# Patient Record
Sex: Female | Born: 1935
Health system: Southern US, Community
[De-identification: ages and names within clinical notes are randomized; demographics above are authoritative.]

## PROBLEM LIST (undated history)

## (undated) DIAGNOSIS — I739 Peripheral vascular disease, unspecified: Secondary | ICD-10-CM

## (undated) DIAGNOSIS — G43909 Migraine, unspecified, not intractable, without status migrainosus: Secondary | ICD-10-CM

## (undated) DIAGNOSIS — M810 Age-related osteoporosis without current pathological fracture: Secondary | ICD-10-CM

## (undated) DIAGNOSIS — G459 Transient cerebral ischemic attack, unspecified: Secondary | ICD-10-CM

## (undated) DIAGNOSIS — K219 Gastro-esophageal reflux disease without esophagitis: Secondary | ICD-10-CM

## (undated) DIAGNOSIS — J189 Pneumonia, unspecified organism: Secondary | ICD-10-CM

## (undated) DIAGNOSIS — D649 Anemia, unspecified: Secondary | ICD-10-CM

## (undated) DIAGNOSIS — E785 Hyperlipidemia, unspecified: Secondary | ICD-10-CM

## (undated) DIAGNOSIS — I10 Essential (primary) hypertension: Secondary | ICD-10-CM

## (undated) DIAGNOSIS — I639 Cerebral infarction, unspecified: Secondary | ICD-10-CM

## (undated) DIAGNOSIS — I8390 Asymptomatic varicose veins of unspecified lower extremity: Secondary | ICD-10-CM

## (undated) DIAGNOSIS — G9050 Complex regional pain syndrome I, unspecified: Secondary | ICD-10-CM

## (undated) DIAGNOSIS — E119 Type 2 diabetes mellitus without complications: Secondary | ICD-10-CM

## (undated) DIAGNOSIS — E11319 Type 2 diabetes mellitus with unspecified diabetic retinopathy without macular edema: Secondary | ICD-10-CM

## (undated) DIAGNOSIS — H35039 Hypertensive retinopathy, unspecified eye: Secondary | ICD-10-CM

## (undated) DIAGNOSIS — Z794 Long term (current) use of insulin: Secondary | ICD-10-CM

## (undated) HISTORY — PX: BREAST CYST EXCISION: SHX579

## (undated) HISTORY — DX: Hypertensive retinopathy, unspecified eye: H35.039

## (undated) HISTORY — DX: Complex regional pain syndrome I, unspecified: G90.50

## (undated) HISTORY — DX: Anemia, unspecified: D64.9

## (undated) HISTORY — DX: Hyperlipidemia, unspecified: E78.5

## (undated) HISTORY — DX: Essential (primary) hypertension: I10

## (undated) HISTORY — DX: Type 2 diabetes mellitus with unspecified diabetic retinopathy without macular edema: E11.319

## (undated) HISTORY — DX: Cerebral infarction, unspecified: I63.9

## (undated) HISTORY — PX: TUBAL LIGATION: SHX77

## (undated) HISTORY — DX: Peripheral vascular disease, unspecified: I73.9

## (undated) HISTORY — PX: EYE SURGERY: SHX253

## (undated) HISTORY — DX: Age-related osteoporosis without current pathological fracture: M81.0

## (undated) HISTORY — DX: Migraine, unspecified, not intractable, without status migrainosus: G43.909

## (undated) HISTORY — DX: Asymptomatic varicose veins of unspecified lower extremity: I83.90

## (undated) HISTORY — PX: UMBILICAL HERNIA REPAIR: SHX196

## (undated) HISTORY — DX: Gastro-esophageal reflux disease without esophagitis: K21.9

## (undated) HISTORY — PX: OTHER SURGICAL HISTORY: SHX169

## (undated) HISTORY — DX: Pneumonia, unspecified organism: J18.9

---

## 1964-08-02 HISTORY — PX: APPENDECTOMY: SHX54

## 1986-08-02 HISTORY — PX: ABDOMINAL HYSTERECTOMY: SHX81

## 1987-08-03 HISTORY — PX: CHOLECYSTECTOMY: SHX55

## 1998-03-13 ENCOUNTER — Ambulatory Visit (HOSPITAL_COMMUNITY): Admission: RE | Admit: 1998-03-13 | Discharge: 1998-03-13 | Payer: Self-pay | Admitting: Cardiology

## 1999-03-18 ENCOUNTER — Ambulatory Visit (HOSPITAL_COMMUNITY): Admission: RE | Admit: 1999-03-18 | Discharge: 1999-03-18 | Payer: Self-pay | Admitting: Obstetrics and Gynecology

## 1999-03-18 ENCOUNTER — Encounter: Payer: Self-pay | Admitting: Obstetrics and Gynecology

## 2000-03-18 ENCOUNTER — Encounter: Payer: Self-pay | Admitting: Cardiology

## 2000-03-18 ENCOUNTER — Ambulatory Visit (HOSPITAL_COMMUNITY): Admission: RE | Admit: 2000-03-18 | Discharge: 2000-03-18 | Payer: Self-pay | Admitting: Cardiology

## 2000-04-19 ENCOUNTER — Other Ambulatory Visit: Admission: RE | Admit: 2000-04-19 | Discharge: 2000-04-19 | Payer: Self-pay | Admitting: Obstetrics and Gynecology

## 2002-01-19 ENCOUNTER — Ambulatory Visit (HOSPITAL_COMMUNITY): Admission: RE | Admit: 2002-01-19 | Discharge: 2002-01-19 | Payer: Self-pay | Admitting: Obstetrics and Gynecology

## 2002-01-19 ENCOUNTER — Encounter: Payer: Self-pay | Admitting: Obstetrics and Gynecology

## 2003-01-23 ENCOUNTER — Encounter: Payer: Self-pay | Admitting: Obstetrics and Gynecology

## 2003-01-23 ENCOUNTER — Ambulatory Visit (HOSPITAL_COMMUNITY): Admission: RE | Admit: 2003-01-23 | Discharge: 2003-01-23 | Payer: Self-pay | Admitting: Obstetrics and Gynecology

## 2004-05-25 ENCOUNTER — Ambulatory Visit (HOSPITAL_COMMUNITY): Admission: RE | Admit: 2004-05-25 | Discharge: 2004-05-25 | Payer: Self-pay | Admitting: Obstetrics and Gynecology

## 2004-06-29 ENCOUNTER — Ambulatory Visit (HOSPITAL_COMMUNITY): Admission: RE | Admit: 2004-06-29 | Discharge: 2004-06-29 | Payer: Self-pay | Admitting: Obstetrics and Gynecology

## 2005-05-26 ENCOUNTER — Encounter: Admission: RE | Admit: 2005-05-26 | Discharge: 2005-05-26 | Payer: Self-pay | Admitting: Obstetrics and Gynecology

## 2005-08-02 DIAGNOSIS — G9050 Complex regional pain syndrome I, unspecified: Secondary | ICD-10-CM

## 2005-08-02 HISTORY — DX: Complex regional pain syndrome I, unspecified: G90.50

## 2006-11-04 ENCOUNTER — Encounter: Admission: RE | Admit: 2006-11-04 | Discharge: 2006-11-04 | Payer: Self-pay | Admitting: Obstetrics and Gynecology

## 2006-11-18 ENCOUNTER — Ambulatory Visit (HOSPITAL_COMMUNITY): Admission: RE | Admit: 2006-11-18 | Discharge: 2006-11-18 | Payer: Self-pay | Admitting: Obstetrics and Gynecology

## 2007-11-10 ENCOUNTER — Encounter: Admission: RE | Admit: 2007-11-10 | Discharge: 2007-11-10 | Payer: Self-pay | Admitting: Obstetrics and Gynecology

## 2009-10-31 HISTORY — PX: CATARACT EXTRACTION: SUR2

## 2010-03-19 ENCOUNTER — Ambulatory Visit: Payer: Self-pay | Admitting: Cardiology

## 2010-04-30 ENCOUNTER — Ambulatory Visit: Payer: Self-pay | Admitting: Cardiology

## 2010-04-30 LAB — LIPID PANEL
Cholesterol: 211 mg/dL — AB (ref 0–200)
HDL: 27 mg/dL — AB (ref 35–70)
Triglycerides: 954 mg/dL — AB (ref 40–160)

## 2010-04-30 LAB — BASIC METABOLIC PANEL: Glucose: 153 mg/dL

## 2010-04-30 LAB — HEMOGLOBIN A1C: Hgb A1c MFr Bld: 9.2 % — AB (ref 4.0–6.0)

## 2010-04-30 LAB — HEPATIC FUNCTION PANEL: Bilirubin, Total: 154 mg/dL

## 2010-08-10 ENCOUNTER — Encounter: Payer: Self-pay | Admitting: Cardiology

## 2010-08-10 DIAGNOSIS — I10 Essential (primary) hypertension: Secondary | ICD-10-CM | POA: Insufficient documentation

## 2010-08-10 DIAGNOSIS — E119 Type 2 diabetes mellitus without complications: Secondary | ICD-10-CM | POA: Insufficient documentation

## 2010-08-10 DIAGNOSIS — E785 Hyperlipidemia, unspecified: Secondary | ICD-10-CM | POA: Insufficient documentation

## 2010-08-10 DIAGNOSIS — E781 Pure hyperglyceridemia: Secondary | ICD-10-CM | POA: Insufficient documentation

## 2010-08-22 ENCOUNTER — Encounter: Payer: Self-pay | Admitting: Obstetrics and Gynecology

## 2010-08-24 ENCOUNTER — Encounter: Payer: Self-pay | Admitting: Obstetrics and Gynecology

## 2010-09-02 ENCOUNTER — Ambulatory Visit (INDEPENDENT_AMBULATORY_CARE_PROVIDER_SITE_OTHER): Payer: Medicare Other | Admitting: Cardiology

## 2010-09-02 ENCOUNTER — Other Ambulatory Visit: Payer: Self-pay

## 2010-09-02 ENCOUNTER — Ambulatory Visit: Payer: Self-pay | Admitting: Cardiology

## 2010-09-02 DIAGNOSIS — I119 Hypertensive heart disease without heart failure: Secondary | ICD-10-CM

## 2010-09-02 DIAGNOSIS — E781 Pure hyperglyceridemia: Secondary | ICD-10-CM

## 2010-09-02 DIAGNOSIS — E119 Type 2 diabetes mellitus without complications: Secondary | ICD-10-CM

## 2010-09-02 DIAGNOSIS — Z79899 Other long term (current) drug therapy: Secondary | ICD-10-CM

## 2010-11-04 ENCOUNTER — Other Ambulatory Visit: Payer: Self-pay | Admitting: Cardiology

## 2010-11-04 NOTE — Telephone Encounter (Signed)
Refill meds

## 2010-11-19 ENCOUNTER — Other Ambulatory Visit: Payer: Self-pay | Admitting: Cardiology

## 2010-11-19 DIAGNOSIS — E781 Pure hyperglyceridemia: Secondary | ICD-10-CM

## 2010-11-19 NOTE — Telephone Encounter (Signed)
Refill per escribe request.  

## 2010-12-01 ENCOUNTER — Other Ambulatory Visit: Payer: Self-pay | Admitting: *Deleted

## 2010-12-01 DIAGNOSIS — E7800 Pure hypercholesterolemia, unspecified: Secondary | ICD-10-CM

## 2010-12-09 ENCOUNTER — Encounter: Payer: Self-pay | Admitting: Cardiology

## 2011-01-01 ENCOUNTER — Other Ambulatory Visit: Payer: Self-pay | Admitting: Cardiology

## 2011-01-01 ENCOUNTER — Encounter: Payer: Self-pay | Admitting: Cardiology

## 2011-01-01 ENCOUNTER — Ambulatory Visit (INDEPENDENT_AMBULATORY_CARE_PROVIDER_SITE_OTHER): Payer: Medicare Other | Admitting: Cardiology

## 2011-01-01 ENCOUNTER — Other Ambulatory Visit (INDEPENDENT_AMBULATORY_CARE_PROVIDER_SITE_OTHER): Payer: Medicare Other | Admitting: *Deleted

## 2011-01-01 VITALS — BP 130/70 | HR 70 | Ht 67.0 in | Wt 162.6 lb

## 2011-01-01 DIAGNOSIS — E119 Type 2 diabetes mellitus without complications: Secondary | ICD-10-CM

## 2011-01-01 DIAGNOSIS — E785 Hyperlipidemia, unspecified: Secondary | ICD-10-CM

## 2011-01-01 DIAGNOSIS — E7800 Pure hypercholesterolemia, unspecified: Secondary | ICD-10-CM

## 2011-01-01 DIAGNOSIS — R1013 Epigastric pain: Secondary | ICD-10-CM

## 2011-01-01 DIAGNOSIS — I10 Essential (primary) hypertension: Secondary | ICD-10-CM

## 2011-01-01 DIAGNOSIS — K3189 Other diseases of stomach and duodenum: Secondary | ICD-10-CM

## 2011-01-01 LAB — LIPID PANEL
Cholesterol: 183 mg/dL (ref 0–200)
HDL: 39.3 mg/dL (ref >39.00)
Total CHOL/HDL Ratio: 5
Triglycerides: 833 mg/dL — ABNORMAL HIGH (ref 0.0–149.0)
VLDL: 166.6 mg/dL — ABNORMAL HIGH (ref 0.0–40.0)

## 2011-01-01 LAB — BASIC METABOLIC PANEL
BUN: 19 mg/dL (ref 6–23)
CO2: 29 mEq/L (ref 19–32)
Calcium: 9.4 mg/dL (ref 8.4–10.5)
Chloride: 106 mEq/L (ref 96–112)
Creatinine, Ser: 0.7 mg/dL (ref 0.4–1.2)
GFR: 82.92 mL/min (ref >60.00)
Glucose, Bld: 182 mg/dL — ABNORMAL HIGH (ref 70–99)
Potassium: 4.3 mEq/L (ref 3.5–5.1)
Sodium: 140 mEq/L (ref 135–145)

## 2011-01-01 LAB — HEPATIC FUNCTION PANEL
ALT: 20 U/L (ref 0–35)
AST: 18 U/L (ref 0–37)
Albumin: 3.5 g/dL (ref 3.5–5.2)
Alkaline Phosphatase: 93 U/L (ref 39–117)
Bilirubin, Direct: 0.1 mg/dL (ref 0.0–0.3)
Total Bilirubin: 0.8 mg/dL (ref 0.3–1.2)
Total Protein: 6.9 g/dL (ref 6.0–8.3)

## 2011-01-01 LAB — LDL CHOLESTEROL, DIRECT: Direct LDL: 41.3 mg/dL

## 2011-01-01 MED ORDER — OMEPRAZOLE MAGNESIUM 20 MG PO TBEC: 20.0000 mg | DELAYED_RELEASE_TABLET | Freq: Every day | ORAL | Status: DC

## 2011-01-01 NOTE — Assessment & Plan Note (Signed)
The patient has a past history of hypertension.  She has not been expressing any headaches or chest pain.  She's not having any shortness of breath.  She denies any dizziness or palpitations.  Recently she had a GI virus with nausea and vomiting for one day and with diarrhea for 5 days.  She lost about 5 pounds during that illness.

## 2011-01-01 NOTE — Assessment & Plan Note (Signed)
The patient has a history of insulin-dependent diabetes.  We had tried going up on her Lantus from 66 units to 68 units but it caused her to be hypoglycemic in the early mornings and so she is back on the previous dose.  If she fails to take a bedtime snack she will be hypoglycemic in the mornings.

## 2011-01-01 NOTE — Progress Notes (Signed)
Ivar Bury Date of Birth:  Apr 05, 1929 [facility] / [facility] [address]   [address] [place], [place]  [address] [phone]           Fax   [phone]  History of Present Illness: This pleasant 77 year old woman is seen for a scheduled followup office visit.  She has a history of essential hypertension, insulin-dependent diabetes mellitus, and hypertriglyceridemia.  She does not have any history of coronary disease.  She had a normal Cardiolite stress test 12/11/07.  She has not had a history of congestive heart failure.  She does not have any history of arrhythmia.  Since last visit she's had no cardiac symptoms.  She continues to have fluctuating blood sugars and often will be hypoglycemic in the morning if she fails to have a bedtime snack.  She has also been experiencing frequent dyspepsia for which she takes over-the-counter antacids  Current Outpatient Prescriptions  Medication Sig Dispense Refill  . calcium carbonate (OS-CAL) 600 MG TABS Take 600 mg by mouth 2 (two) times daily with meals.        . fish oil-omega-3 fatty acids 1000 MG capsule Take 2 g by mouth 2 (two) times daily.        Marland Kitchen gemfibrozil (LOPID) 600 MG tablet TAKE 1 TABLET BY MOUTH TWICE DAILY  60 tablet  11  . glimepiride (AMARYL) 4 MG tablet Take 4 mg by mouth 2 (two) times daily.        . hydrochlorothiazide 25 MG tablet Take 12.5 mg by mouth daily.        . insulin aspart (NOVOLOG) 100 UNIT/ML injection Inject into the skin as needed.        . insulin glargine (LANTUS) 100 UNIT/ML injection Inject 64 Units into the skin daily.       . INSULIN SYRINGE .5CC/29G 29G X 1/2" 0.5 ML MISC USE AS DIRECTED  100 each  PRN  . metoprolol (LOPRESSOR) 50 MG tablet Take 12.5 mg by mouth 2 (two) times daily.        . Multiple Vitamin (MULTIVITAMIN) tablet Take 1 tablet by mouth daily.        . potassium chloride SA (K-DUR,KLOR-CON) 20 MEQ tablet Take 20 mEq by mouth 2 (two) times daily.        . Pyridoxine  HCl (VITAMIN B-6) 100 MG tablet Take 100 mg by mouth daily.        Marland Kitchen omeprazole (PRILOSEC OTC) 20 MG tablet Take 1 tablet (20 mg total) by mouth daily.  30 tablet  11    Allergies  Allergen Reactions  . Actos (Pioglitazone Hydrochloride)   . Glucophage (Metformin Hydrochloride)   . Lipitor (Atorvastatin Calcium) Diarrhea  . Lisinopril   . Niacin And Related   . Pravachol   . Tricor     Patient Active Problem List  Diagnoses  . Diabetes mellitus  . Hypertension  . Hypertriglyceridemia  . Dyslipidemia    History  Smoking status  . Never Smoker   Smokeless tobacco  . Not on file    History  Alcohol Use     Family History  Problem Relation Age of Onset  . Stroke Mother   . Hypertension Mother   . Clotting disorder Father   . Heart attack Father   . Arrhythmia Sister   . Stroke Brother     Review of Systems: Constitutional: no fever chills diaphoresis or fatigue or change in weight.  Head and neck: no hearing loss,  no epistaxis, no photophobia or visual disturbance. Respiratory: No cough, shortness of breath or wheezing. Cardiovascular: No chest pain peripheral edema, palpitations. Gastrointestinal: No abdominal distention, no abdominal pain, no change in bowel habits hematochezia or melena. Genitourinary: No dysuria, no frequency, no urgency, no nocturia. Musculoskeletal:No arthralgias, no back pain, no gait disturbance or myalgias. Neurological: No dizziness, no headaches, no numbness, no seizures, no syncope, no weakness, no tremors. Hematologic: No lymphadenopathy, no easy bruising. Psychiatric: No confusion, no hallucinations, no sleep disturbance.    Physical Exam: Filed Vitals:   01/01/11 0909  BP: 130/70  Pulse: 70  The general appearance reveals a well-developed well-nourished woman in no distress.Pupils equal and reactive.   Extraocular Movements are full.  There is no scleral icterus.  The mouth and pharynx are normal.  The neck is supple.  The  carotids reveal no bruits.  The jugular venous pressure is normal.  The thyroid is not enlarged.  There is no lymphadenopathy.The chest is clear to percussion and auscultation. There are no rales or rhonchi. Expansion of the chest is symmetrical.The precordium is quiet.  The first heart sound is normal.  The second heart sound is physiologically split.  There is no murmur gallop rub or click.  There is no abnormal lift or heave.The abdomen is soft and nontender. Bowel sounds are normal. The liver and spleen are not enlarged. There Are no abdominal masses. There are no bruits.The pedal pulses are good.  There is no phlebitis or edema.  There is no cyanosis or clubbing.Strength is normal and symmetrical in all extremities.  There is no lateralizing weakness.  There are no sensory deficits.The skin is warm and dry.  There is no rash.   Assessment / Plan:  We are checking blood work today.  We will add omeprazole 20 mg daily p.r.n. For dyspepsia.  Recheck in 4 months for followup office visit and lab work including glycohemoglobin

## 2011-01-01 NOTE — Assessment & Plan Note (Signed)
The patient has a history of dyslipidemia with elevated triglycerides.  She is trying hard to watch her diet carefully.  She is a 5 pound weight loss Since last visit

## 2011-01-06 ENCOUNTER — Telehealth: Payer: Self-pay | Admitting: *Deleted

## 2011-01-06 NOTE — Telephone Encounter (Signed)
Message copied by Lorayne Bender on Wed Jan 06, 2011  4:21 PM ------      Message from: Cassell Clement      Created: Sun Jan 03, 2011  9:52 PM       BS 182 higher.TG 833 still very high.LFTs nl.Work harder on diabetic control to decrease TG.  Continue gemfibrisol.

## 2011-01-06 NOTE — Telephone Encounter (Signed)
Notified of lab results. 

## 2011-03-08 ENCOUNTER — Other Ambulatory Visit: Payer: Self-pay | Admitting: Cardiology

## 2011-03-08 DIAGNOSIS — E119 Type 2 diabetes mellitus without complications: Secondary | ICD-10-CM

## 2011-03-09 NOTE — Telephone Encounter (Signed)
escribe request  

## 2011-03-24 ENCOUNTER — Other Ambulatory Visit: Payer: Self-pay | Admitting: *Deleted

## 2011-03-24 DIAGNOSIS — E876 Hypokalemia: Secondary | ICD-10-CM

## 2011-03-24 MED ORDER — POTASSIUM CHLORIDE CRYS ER 20 MEQ PO TBCR: 20.0000 meq | EXTENDED_RELEASE_TABLET | Freq: Two times a day (BID) | ORAL | Status: DC

## 2011-03-24 NOTE — Telephone Encounter (Signed)
Refilled meds per fax request.  

## 2011-03-30 ENCOUNTER — Telehealth: Payer: Self-pay | Admitting: Cardiology

## 2011-03-30 NOTE — Telephone Encounter (Signed)
Dr Caryl Never and that practice not taking new patient who do your recommend?

## 2011-03-30 NOTE — Telephone Encounter (Signed)
Called because she inquired about a consult visit with the PCP that Dr. B referred her too and they told her that they were no longer taking any new patients at this time. Please call back.

## 2011-03-31 NOTE — Telephone Encounter (Signed)
The patient could try Dr. Rene Paci or one of her associates

## 2011-03-31 NOTE — Telephone Encounter (Signed)
Advised patinet

## 2011-05-05 ENCOUNTER — Encounter: Payer: Self-pay | Admitting: Internal Medicine

## 2011-05-05 ENCOUNTER — Other Ambulatory Visit: Payer: Self-pay | Admitting: Cardiology

## 2011-05-05 ENCOUNTER — Ambulatory Visit (INDEPENDENT_AMBULATORY_CARE_PROVIDER_SITE_OTHER): Payer: Medicare Other | Admitting: Internal Medicine

## 2011-05-05 ENCOUNTER — Other Ambulatory Visit (INDEPENDENT_AMBULATORY_CARE_PROVIDER_SITE_OTHER): Payer: Medicare Other

## 2011-05-05 VITALS — BP 128/72 | HR 70 | Temp 98.1°F | Ht 64.0 in | Wt 163.1 lb

## 2011-05-05 DIAGNOSIS — E785 Hyperlipidemia, unspecified: Secondary | ICD-10-CM

## 2011-05-05 DIAGNOSIS — Z23 Encounter for immunization: Secondary | ICD-10-CM

## 2011-05-05 DIAGNOSIS — I10 Essential (primary) hypertension: Secondary | ICD-10-CM

## 2011-05-05 DIAGNOSIS — E119 Type 2 diabetes mellitus without complications: Secondary | ICD-10-CM

## 2011-05-05 LAB — MICROALBUMIN / CREATININE URINE RATIO
Creatinine,U: 100.3 mg/dL
Microalb Creat Ratio: 0.8 mg/g (ref 0.0–30.0)
Microalb, Ur: 0.8 mg/dL (ref 0.0–1.9)

## 2011-05-05 NOTE — Assessment & Plan Note (Signed)
meds and hx reviewed - eye exam annually with optho Check a1c and microalb (intol of ACEI) Lab Results  Component Value Date   HGBA1C 9.2* 04/30/2010

## 2011-05-05 NOTE — Assessment & Plan Note (Signed)
Elevated TGs, intol of statin side effects  On Lopid + fish oil - follows with cards for same Last lipids reviewed reinforced need to monitor diet/exercsie efforts and weight

## 2011-05-05 NOTE — Assessment & Plan Note (Signed)
BP Readings from Last 3 Encounters:  05/05/11 128/72  01/01/11 130/70  intol ACE due to side effects  The current medical regimen is effective;  continue present plan and medications.

## 2011-05-05 NOTE — Progress Notes (Signed)
Subjective:    Patient ID: Rebecca Perkins, female    DOB: 03/07/1929, 77 y.o.   MRN: [id number]  HPI  New pt to me, here to establish care with PCP Reviewed chronic medical issues today  DM2 - insulin dep Lantus bid + prn novolog (rarely uses same) - prev trial of actos and metformin poorly tolerated due to side effects, also intol of lisinopril - checks cbgs 2 x/day and denies symptoms hypoglycemia, AM range 70-120 per report - the patient reports compliance with medication(s) as prescribed. Denies adverse side effects.  dyslipidemia - esp hyperTGs - intol of lipitor and pravachol trials; also adverse side effects niacin - takes fish oil and lopid  HTN - the patient reports compliance with medication(s) as prescribed. Denies adverse side effects.  Past Medical History  Diagnosis Date  . Diabetes mellitus   . Hypertension   . Dyslipidemia   . Varicose veins   . Migraines   . GERD (gastroesophageal reflux disease)   . RSD (reflex sympathetic dystrophy)    Family History  Problem Relation Age of Onset  . Stroke Mother   . Hypertension Mother   . Clotting disorder Father   . Heart attack Father   . Arrhythmia Sister   . Stroke Brother    History  Substance Use Topics  . Smoking status: Never Smoker   . Smokeless tobacco: Not on file  . Alcohol Use: No    Review of Systems Constitutional: Negative for fever.  Respiratory: Negative for cough and shortness of breath.   Cardiovascular: Negative for chest pain.  Gastrointestinal: Negative for abdominal pain.  Musculoskeletal: Negative for gait problem.  Skin: Negative for rash.  Neurological: Negative for dizziness.  No other specific complaints in a complete review of systems (except as listed in HPI above).     Objective:   Physical Exam BP 128/72  Pulse 70  Temp(Src) 98.1 F (36.7 C) (Oral)  Ht 5\' 4"  (1.626 m)  Wt 163 lb 1.9 oz (73.991 kg)  BMI 28.00 kg/m2  SpO2 95% Wt Readings from Last 3 Encounters:    05/05/11 163 lb 1.9 oz (73.991 kg)  01/01/11 162 lb 9.6 oz (73.755 kg)   Constitutional: She is well-developed and well-nourished. No distress.  HENT: Head: Normocephalic and atraumatic. Ears: B TMs ok, no erythema or effusion; Nose: Nose normal.  Mouth/Throat: Oropharynx is clear and moist. No oropharyngeal exudate.  Eyes: Conjunctivae and EOM are normal. Pupils are equal, round, and reactive to light. No scleral icterus.  Neck: Normal range of motion. Neck supple. No JVD present. No thyromegaly present.  Cardiovascular: Normal rate, regular rhythm and normal heart sounds.  No murmur heard. No BLE edema. Pulmonary/Chest: Effort normal and breath sounds normal. No respiratory distress. She has no wheezes.  Abdominal: Soft. Bowel sounds are normal. She exhibits no distension. There is no tenderness. no masses Musculoskeletal: Normal range of motion, no joint effusions. No gross deformities Neurological: She is alert and oriented to person, place, and time. No cranial nerve deficit. Coordination normal.  Skin: Skin is warm and dry. No rash noted. No erythema.  Psychiatric: She has a normal mood and affect. Her behavior is normal. Judgment and thought content normal.   Lab Results  Component Value Date   CHOL 183 01/01/2011   TRIG 833.0 Triglyceride is over 400; calculations on Lipids are invalid.* 01/01/2011   HDL 39.30 01/01/2011   LDLDIRECT 41.3 01/01/2011   ALT 20 01/01/2011   AST 18 01/01/2011  NA 140 01/01/2011   K 4.3 01/01/2011   CL 106 01/01/2011   CREATININE 0.7 01/01/2011   BUN 19 01/01/2011   CO2 29 01/01/2011   HGBA1C 9.2* 04/30/2010      Assessment & Plan:  See problem list. Medications and labs reviewed today. Time spent with pt today 45 minutes, greater than 50% time spent counseling patient on diabetes, triglycerides and medication review. Also review of prior records

## 2011-05-05 NOTE — Patient Instructions (Signed)
It was good to see you today. We have reviewed your prior records including labs and tests today Medications reviewed, no changes at this time. Flu shot done today - other Health Maintenance reviewed - up to date Test(s) ordered today. Your results will be called to you after review (48-72hours after test completion). If any changes need to be made, you will be notified at that time. Please schedule followup in 3-4 months to monitor diabetes mellitus, call sooner if problems.

## 2011-05-07 ENCOUNTER — Other Ambulatory Visit: Payer: Self-pay | Admitting: *Deleted

## 2011-05-07 ENCOUNTER — Other Ambulatory Visit: Payer: Self-pay | Admitting: Cardiology

## 2011-05-07 LAB — HEMOGLOBIN A1C: Hgb A1c MFr Bld: 8.3 % — ABNORMAL HIGH (ref 4.6–6.5)

## 2011-05-07 MED ORDER — GLUCOSE BLOOD VI STRP: ORAL_STRIP | Status: AC

## 2011-05-07 NOTE — Telephone Encounter (Signed)
Refilled test strips.

## 2011-05-07 NOTE — Telephone Encounter (Signed)
Refused refill for test strips,to get from PCP

## 2011-05-08 ENCOUNTER — Other Ambulatory Visit: Payer: Self-pay | Admitting: Internal Medicine

## 2011-05-08 MED ORDER — LINAGLIPTIN 5 MG PO TABS: 5.0000 mg | ORAL_TABLET | Freq: Every day | ORAL | Status: DC

## 2011-05-11 ENCOUNTER — Telehealth: Payer: Self-pay | Admitting: *Deleted

## 2011-05-11 NOTE — Telephone Encounter (Signed)
Received fax for PA on omeprazole 20 mg. Contacted insurance spoke with rep gave info to process PA on med. Med was approve case # [id number]. Notified pharmacy spoke with Desma Mcgregor will get ready today for pt...05/11/11@11 :36am/LMB

## 2011-06-21 ENCOUNTER — Ambulatory Visit: Payer: Medicare Other | Admitting: Cardiology

## 2011-06-21 ENCOUNTER — Other Ambulatory Visit: Payer: Medicare Other | Admitting: *Deleted

## 2011-07-31 ENCOUNTER — Other Ambulatory Visit: Payer: Self-pay | Admitting: Cardiology

## 2011-09-08 ENCOUNTER — Encounter: Payer: Self-pay | Admitting: Internal Medicine

## 2011-09-08 ENCOUNTER — Ambulatory Visit (INDEPENDENT_AMBULATORY_CARE_PROVIDER_SITE_OTHER): Payer: Medicare Other | Admitting: Internal Medicine

## 2011-09-08 DIAGNOSIS — I10 Essential (primary) hypertension: Secondary | ICD-10-CM

## 2011-09-08 DIAGNOSIS — E119 Type 2 diabetes mellitus without complications: Secondary | ICD-10-CM

## 2011-09-08 DIAGNOSIS — E785 Hyperlipidemia, unspecified: Secondary | ICD-10-CM

## 2011-09-08 NOTE — Assessment & Plan Note (Signed)
Elevated TGs, intol of statin side effects  On Lopid + fish oil - follows with cards for same Last lipids reviewed - check annually reinforced need to monitor diet/exercsie efforts and weight  
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
## 2011-09-08 NOTE — Assessment & Plan Note (Signed)
meds and hx reviewed - eye exam annually with optho Check a1c q3-32mo and microalb q 55mo (intol of ACEI) Insuline dep - intol of actos and metformin in past trajenta added 05/2011 - doing well Lab Results  Component Value Date   HGBA1C 8.3* 05/05/2011

## 2011-09-08 NOTE — Assessment & Plan Note (Signed)
BP Readings from Last 3 Encounters:  09/08/11 132/72  05/05/11 128/72  01/01/11 130/70  intol ACE due to side effects  The current medical regimen is effective;  continue present plan and medications.

## 2011-09-08 NOTE — Patient Instructions (Signed)
It was good to see you today. Medications reviewed, no changes at this time. Test(s) ordered today. Your results will be called to you after review (48-72hours after test completion). If any changes need to be made, you will be notified at that time. Please schedule followup in 6 months to monitor diabetes mellitus, call sooner if problems.

## 2011-09-13 ENCOUNTER — Other Ambulatory Visit (INDEPENDENT_AMBULATORY_CARE_PROVIDER_SITE_OTHER): Payer: Medicare Other

## 2011-09-13 ENCOUNTER — Other Ambulatory Visit: Payer: Self-pay | Admitting: Internal Medicine

## 2011-09-13 DIAGNOSIS — E119 Type 2 diabetes mellitus without complications: Secondary | ICD-10-CM

## 2011-09-13 DIAGNOSIS — E785 Hyperlipidemia, unspecified: Secondary | ICD-10-CM

## 2011-09-13 LAB — LDL CHOLESTEROL, DIRECT: Direct LDL: 41.8 mg/dL

## 2011-09-13 LAB — LIPID PANEL
Cholesterol: 187 mg/dL (ref 0–200)
HDL: 41.9 mg/dL (ref >39.00)
Total CHOL/HDL Ratio: 4
Triglycerides: 658 mg/dL — ABNORMAL HIGH (ref 0.0–149.0)
VLDL: 131.6 mg/dL — ABNORMAL HIGH (ref 0.0–40.0)

## 2011-09-13 LAB — HEMOGLOBIN A1C: Hgb A1c MFr Bld: 8.6 % — ABNORMAL HIGH (ref 4.6–6.5)

## 2011-09-13 MED ORDER — INSULIN GLARGINE 100 UNIT/ML ~~LOC~~ SOLN: 35.0000 [IU] | Freq: Two times a day (BID) | SUBCUTANEOUS | Status: DC

## 2011-11-15 ENCOUNTER — Other Ambulatory Visit: Payer: Self-pay | Admitting: Cardiology

## 2011-11-15 NOTE — Telephone Encounter (Signed)
Refilled syringes.

## 2011-12-05 ENCOUNTER — Other Ambulatory Visit: Payer: Self-pay | Admitting: Cardiology

## 2011-12-06 NOTE — Telephone Encounter (Signed)
Refilled generic lopid

## 2011-12-31 ENCOUNTER — Other Ambulatory Visit: Payer: Self-pay | Admitting: Cardiology

## 2012-03-08 ENCOUNTER — Ambulatory Visit (INDEPENDENT_AMBULATORY_CARE_PROVIDER_SITE_OTHER): Payer: Medicare Other | Admitting: Internal Medicine

## 2012-03-08 ENCOUNTER — Other Ambulatory Visit (INDEPENDENT_AMBULATORY_CARE_PROVIDER_SITE_OTHER): Payer: Medicare Other

## 2012-03-08 ENCOUNTER — Encounter: Payer: Self-pay | Admitting: Internal Medicine

## 2012-03-08 VITALS — BP 130/70 | HR 65 | Temp 97.7°F | Ht 64.0 in | Wt 166.4 lb

## 2012-03-08 DIAGNOSIS — Z0000 Encounter for general adult medical examination without abnormal findings: Secondary | ICD-10-CM

## 2012-03-08 DIAGNOSIS — E119 Type 2 diabetes mellitus without complications: Secondary | ICD-10-CM

## 2012-03-08 DIAGNOSIS — I10 Essential (primary) hypertension: Secondary | ICD-10-CM

## 2012-03-08 DIAGNOSIS — E785 Hyperlipidemia, unspecified: Secondary | ICD-10-CM

## 2012-03-08 LAB — HEMOGLOBIN A1C: Hgb A1c MFr Bld: 8.1 % — ABNORMAL HIGH (ref 4.6–6.5)

## 2012-03-08 MED ORDER — INSULIN GLARGINE 100 UNIT/ML ~~LOC~~ SOLN: 60.0000 [IU] | Freq: Every day | SUBCUTANEOUS | Status: DC

## 2012-03-08 NOTE — Progress Notes (Signed)
Subjective:    Patient ID: Rebecca Perkins, female    DOB: 01-11-1929, 78 y.o.   MRN: [id number]  HPI  Here for medicare wellness  Diet: heart healthy and diabetic Physical activity: sedentary Depression/mood screen: negative Hearing: intact to whispered voice Visual acuity: grossly normal, performs annual eye exam  ADLs: capable Fall risk: none Home safety: good Cognitive evaluation: intact to orientation, naming, recall and repetition EOL planning: adv directives, full code/ I agree  I have personally reviewed and have noted 1. The patient's medical and social history 2. Their use of alcohol, tobacco or illicit drugs 3. Their current medications and supplements 4. The patient's functional ability including ADL's, fall risks, home safety risks and hearing or visual impairment. 5. Diet and physical activities 6. Evidence for depression or mood disorders  Also reviewed chronic medical issues today  DM2 - insulin dep Lantus bid + prn novolog (rarely uses same) - prev trial of actos, metformin and trajenta poorly tolerated due to side effects and cost, also intol of lisinopril - checks cbgs 2 x/day and denies symptoms hypoglycemia, AM range 70-120 per report - the patient reports compliance with medication(s) as prescribed. Denies adverse side effects.  dyslipidemia - esp hyperTGs - intol of lipitor and pravachol trials; also adverse side effects niacin - takes fish oil and lopid  hypertension - the patient reports compliance with medication(s) as prescribed. Denies adverse side effects.  Past Medical History  Diagnosis Date  . Diabetes mellitus   . Hypertension   . Dyslipidemia   . Varicose veins   . Migraines   . GERD (gastroesophageal reflux disease)   . RSD (reflex sympathetic dystrophy) 2007    R wrist/hand following fx   Family History  Problem Relation Age of Onset  . Stroke Mother 23  . Hypertension Mother   . Clotting disorder Father   . Heart attack Father    . Arrhythmia Sister   . Stroke Brother    History  Substance Use Topics  . Smoking status: Never Smoker   . Smokeless tobacco: Not on file  . Alcohol Use: No     Review of Systems Constitutional: Negative for fever or weight change.  Respiratory: Negative for cough and shortness of breath.   Cardiovascular: Negative for chest pain or palpitations.  Gastrointestinal: Negative for abdominal pain, no bowel changes.  Musculoskeletal: Negative for gait problem or joint swelling.  Skin: Negative for rash.  Neurological: Negative for dizziness or headache.  No other specific complaints in a complete review of systems (except as listed in HPI above).      Objective:   Physical Exam  BP 130/70  Pulse 65  Temp 97.7 F (36.5 C) (Oral)  Ht 5\' 4"  (1.626 m)  Wt 166 lb 6.4 oz (75.479 kg)  BMI 28.56 kg/m2  SpO2 97% Wt Readings from Last 3 Encounters:  03/08/12 166 lb 6.4 oz (75.479 kg)  09/08/11 162 lb 6.4 oz (73.664 kg)  05/05/11 163 lb 1.9 oz (73.991 kg)   Constitutional: She is well-developed and well-nourished. No distress. Neck: Normal range of motion. Neck supple. No JVD present. No thyromegaly present.  Cardiovascular: Normal rate, regular rhythm and normal heart sounds.  No murmur heard. No BLE edema. Pulmonary/Chest: Effort normal and breath sounds normal. No respiratory distress. She has no wheezes.  Psychiatric: She has a normal mood and affect. Her behavior is normal. Judgment and thought content normal.   Lab Results  Component Value Date   CHOL 187 09/13/2011  TRIG 658.0* 09/13/2011   HDL 41.90 09/13/2011   LDLDIRECT 41.8 09/13/2011   ALT 20 01/01/2011   AST 18 01/01/2011   NA 140 01/01/2011   K 4.3 01/01/2011   CL 106 01/01/2011   CREATININE 0.7 01/01/2011   BUN 19 01/01/2011   CO2 29 01/01/2011   HGBA1C 8.6* 09/13/2011   MICROALBUR 0.8 05/05/2011      Assessment & Plan:  AWV/v70.0 - Today patient counseled on age appropriate routine health concerns for screening and  prevention, each reviewed and up to date or declined. Immunizations reviewed and up to date or declined. Labs ordered and reviewed. Risk factors for depression reviewed and negative. Hearing function and visual acuity are intact. ADLs screened and addressed as needed. Functional ability and level of safety reviewed and appropriate. Education, counseling and referrals performed based on assessed risks today. Patient provided with a copy of personalized plan for preventive services.   See problem list. Medications and labs reviewed today.

## 2012-03-08 NOTE — Assessment & Plan Note (Signed)
BP Readings from Last 3 Encounters:  03/08/12 130/70  09/08/11 132/72  05/05/11 128/72  intol ACE due to side effects  The current medical regimen is effective;  continue present plan and medications.

## 2012-03-08 NOTE — Patient Instructions (Signed)
It was good to see you today. We have reviewed your prior records including labs and tests today Health Maintenance reviewed - all recommended immunizations and age-appropriate screenings are up-to-date.  Test(s) ordered today. Your results will be called to you after review (48-72hours after test completion). If any changes need to be made, you will be notified at that time. Medications reviewed, change Lantus to 60units once daily. Other medications unchanged Please schedule followup in 6 months, call sooner if problems.

## 2012-03-08 NOTE — Assessment & Plan Note (Signed)
meds and hx reviewed - eye exam annually with optho Check a1c q3-48mo and microalb q 29mo (intol of ACEI) Insulin dep - intol of actos and metformin in past trajenta added 05/2011 - but stopped 2/13 due to rising a1c and cost Will change to Lantus 60U q AM and continue glimepride Lab Results  Component Value Date   HGBA1C 8.6* 09/13/2011

## 2012-03-08 NOTE — Assessment & Plan Note (Signed)
Elevated TGs, intol of statin side effects  On Lopid + fish oil - follows with cards for same Last lipids reviewed - check annually reinforced need to monitor diet/exercsie efforts and weight

## 2012-03-20 ENCOUNTER — Other Ambulatory Visit: Payer: Self-pay | Admitting: Internal Medicine

## 2012-03-31 ENCOUNTER — Other Ambulatory Visit: Payer: Self-pay | Admitting: Cardiology

## 2012-03-31 NOTE — Telephone Encounter (Signed)
Refilled potassium

## 2012-04-01 ENCOUNTER — Other Ambulatory Visit: Payer: Self-pay | Admitting: Internal Medicine

## 2012-04-05 ENCOUNTER — Other Ambulatory Visit: Payer: Self-pay | Admitting: *Deleted

## 2012-04-05 MED ORDER — INSULIN GLARGINE 100 UNIT/ML ~~LOC~~ SOLN: 64.0000 [IU] | Freq: Every day | SUBCUTANEOUS | Status: DC

## 2012-05-23 ENCOUNTER — Emergency Department (HOSPITAL_COMMUNITY): Payer: Medicare Other

## 2012-05-23 ENCOUNTER — Encounter (HOSPITAL_COMMUNITY): Payer: Self-pay | Admitting: Adult Health

## 2012-05-23 ENCOUNTER — Emergency Department (HOSPITAL_COMMUNITY)
Admission: EM | Admit: 2012-05-23 | Discharge: 2012-05-23 | Disposition: A | Discharge status: Home / Self Care | Payer: Medicare Other | Attending: Emergency Medicine | Admitting: Emergency Medicine

## 2012-05-23 DIAGNOSIS — E785 Hyperlipidemia, unspecified: Secondary | ICD-10-CM | POA: Insufficient documentation

## 2012-05-23 DIAGNOSIS — Y9389 Activity, other specified: Secondary | ICD-10-CM | POA: Insufficient documentation

## 2012-05-23 DIAGNOSIS — Y9229 Other specified public building as the place of occurrence of the external cause: Secondary | ICD-10-CM | POA: Insufficient documentation

## 2012-05-23 DIAGNOSIS — W1809XA Striking against other object with subsequent fall, initial encounter: Secondary | ICD-10-CM | POA: Insufficient documentation

## 2012-05-23 DIAGNOSIS — S335XXA Sprain of ligaments of lumbar spine, initial encounter: Secondary | ICD-10-CM

## 2012-05-23 DIAGNOSIS — S0990XA Unspecified injury of head, initial encounter: Secondary | ICD-10-CM

## 2012-05-23 DIAGNOSIS — Z794 Long term (current) use of insulin: Secondary | ICD-10-CM | POA: Insufficient documentation

## 2012-05-23 DIAGNOSIS — I8390 Asymptomatic varicose veins of unspecified lower extremity: Secondary | ICD-10-CM | POA: Insufficient documentation

## 2012-05-23 DIAGNOSIS — Z79899 Other long term (current) drug therapy: Secondary | ICD-10-CM | POA: Insufficient documentation

## 2012-05-23 DIAGNOSIS — E119 Type 2 diabetes mellitus without complications: Secondary | ICD-10-CM | POA: Insufficient documentation

## 2012-05-23 DIAGNOSIS — I10 Essential (primary) hypertension: Secondary | ICD-10-CM | POA: Insufficient documentation

## 2012-05-23 DIAGNOSIS — K219 Gastro-esophageal reflux disease without esophagitis: Secondary | ICD-10-CM | POA: Insufficient documentation

## 2012-05-23 MED ORDER — PROPOFOL 10 MG/ML IV EMUL
INTRAVENOUS | Status: AC
  Filled 2012-05-23: qty 100
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
## 2012-07-31 ENCOUNTER — Other Ambulatory Visit: Payer: Self-pay

## 2012-07-31 MED ORDER — METOPROLOL TARTRATE 50 MG PO TABS: 50.0000 mg | ORAL_TABLET | Freq: Two times a day (BID) | ORAL | Status: DC

## 2012-08-03 ENCOUNTER — Other Ambulatory Visit: Payer: Self-pay

## 2012-08-09 ENCOUNTER — Other Ambulatory Visit: Payer: Self-pay | Admitting: *Deleted

## 2012-08-09 MED ORDER — GLIMEPIRIDE 4 MG PO TABS: 4.0000 mg | ORAL_TABLET | Freq: Two times a day (BID) | ORAL | Status: DC

## 2012-09-12 ENCOUNTER — Ambulatory Visit: Payer: Medicare Other | Admitting: Internal Medicine

## 2012-10-18 ENCOUNTER — Telehealth: Payer: Self-pay | Admitting: *Deleted

## 2012-10-18 DIAGNOSIS — Z780 Asymptomatic menopausal state: Secondary | ICD-10-CM

## 2012-10-18 NOTE — Telephone Encounter (Signed)
Have placed order

## 2012-10-18 NOTE — Telephone Encounter (Signed)
Please order Dexa Scan in EPIC

## 2012-10-27 ENCOUNTER — Other Ambulatory Visit: Payer: Self-pay | Admitting: Family Medicine

## 2012-10-27 ENCOUNTER — Encounter (HOSPITAL_COMMUNITY): Payer: Self-pay | Admitting: Internal Medicine

## 2012-10-27 ENCOUNTER — Ambulatory Visit (HOSPITAL_COMMUNITY)
Admission: RE | Admit: 2012-10-27 | Discharge: 2012-10-27 | Disposition: A | Discharge status: Home / Self Care | Payer: Medicare Other | Source: Ambulatory Visit | Attending: Family Medicine | Admitting: Family Medicine

## 2012-10-27 DIAGNOSIS — Z780 Asymptomatic menopausal state: Secondary | ICD-10-CM

## 2012-10-27 DIAGNOSIS — Z13820 Encounter for screening for osteoporosis: Secondary | ICD-10-CM | POA: Insufficient documentation

## 2012-11-13 ENCOUNTER — Other Ambulatory Visit: Payer: Self-pay | Admitting: Internal Medicine

## 2012-11-14 ENCOUNTER — Other Ambulatory Visit: Payer: Self-pay | Admitting: *Deleted

## 2012-11-14 ENCOUNTER — Telehealth: Payer: Self-pay | Admitting: Family Medicine

## 2012-11-14 NOTE — Telephone Encounter (Signed)
Per Dr. Tanya Nones Stop Novolog and Glimepiride and check FBS and 2 hours PP (supper) BS and call us in a week to report how she is doing. Pt is aware and agrees.

## 2012-11-29 ENCOUNTER — Telehealth: Payer: Self-pay | Admitting: Family Medicine

## 2012-11-29 MED ORDER — GLUCOSE BLOOD VI STRP: ORAL_STRIP | Status: DC

## 2012-11-29 NOTE — Telephone Encounter (Signed)
Rx Refilled  

## 2012-12-04 ENCOUNTER — Encounter: Payer: Self-pay | Admitting: Cardiology

## 2012-12-11 ENCOUNTER — Telehealth: Payer: Self-pay | Admitting: Family Medicine

## 2012-12-11 NOTE — Telephone Encounter (Signed)
Patient is asymptomatic, I called her back at 5:30. She will come in tomorrow morning let us recheck her blood pressure.

## 2012-12-12 ENCOUNTER — Ambulatory Visit (INDEPENDENT_AMBULATORY_CARE_PROVIDER_SITE_OTHER): Payer: 59 | Admitting: Family Medicine

## 2012-12-12 VITALS — BP 176/86

## 2012-12-12 DIAGNOSIS — I10 Essential (primary) hypertension: Secondary | ICD-10-CM

## 2012-12-12 MED ORDER — LOSARTAN POTASSIUM 50 MG PO TABS: 50.0000 mg | ORAL_TABLET | Freq: Every day | ORAL | Status: DC

## 2012-12-12 NOTE — Progress Notes (Signed)
Patient ID: Rebecca Perkins, female   DOB: [birth year], 79 y.o.   MRN: [id number] Pt came for nurse visit for BP check.  Stated nurse from her insurance provider came to do in home assessment yesterday and found her BP to be high.  She does not remember value.  Patient also stated she spoke to Dr Tanya Nones about this and he recommended she come to office today for BP check.  BP was taken, reading were 176/84 Rt arm and 176/86 Lt arm.  She stated she was on HCTZ but that Dr. Tanya Nones had discontinued that after last visit. I showed Dr Tanya Nones today's BP readings.  He recommended we start her on Losartan 50 mg one tablet by mouth daily and have patient return with follow up appt with him in 2 weeks.  Be sure that she is following all other medications discussed after last visit (i.e. Metformin).   Pt states the metformin was making her sick and she has stopped taking.  Also not using her Novolog insulin.  Only taking her Lantus insulin 60u once daily.  This information also relayed to Dr Tanya Nones.  Patient to start losartan and follow up in 2 weeks.  Rx sent to pt pharmacy of choice and appt made for follow up visit.

## 2012-12-20 ENCOUNTER — Telehealth: Payer: Self-pay | Admitting: Family Medicine

## 2012-12-20 NOTE — Telephone Encounter (Signed)
Was orderd on 12/04/12 with 11 rf.

## 2012-12-26 ENCOUNTER — Encounter: Payer: Self-pay | Admitting: Family Medicine

## 2012-12-26 ENCOUNTER — Ambulatory Visit (INDEPENDENT_AMBULATORY_CARE_PROVIDER_SITE_OTHER): Payer: 59 | Admitting: Family Medicine

## 2012-12-26 VITALS — BP 140/70 | HR 62 | Temp 98.1°F | Resp 14 | Wt 159.0 lb

## 2012-12-26 DIAGNOSIS — I10 Essential (primary) hypertension: Secondary | ICD-10-CM

## 2012-12-26 DIAGNOSIS — E781 Pure hyperglyceridemia: Secondary | ICD-10-CM

## 2012-12-26 DIAGNOSIS — IMO0001 Reserved for inherently not codable concepts without codable children: Secondary | ICD-10-CM

## 2012-12-26 NOTE — Progress Notes (Signed)
Subjective:    Patient ID: Rebecca Perkins, female    DOB: [birth year], 79 y.o.   MRN: [id number]  HPI I originally met the patient 10/10/2012.  At that time fasting lipid panel revealed a triglyceride of 1438.  The patient was supposed to discontinue Lopid and start Trilipix 135 mg by mouth daily. However the patient continue both medications inadvertently. She does report some muscle tenderness. Her hydrochlorothiazide was also discontinued due to the elevated triglycerides. 2 weeks ago she was found to have blood pressures of 160/90. That time she was started on losartan 50 mg by mouth daily. She is here today to recheck. Blood pressures are continuing to run slightly high in the 140-150 range over 90s. She denies any chest pain shortness of breath or dyspnea on exertion. Just has insulin-dependent diabetes mellitus type 2. She is currently taking Amaryl 4 mg by mouth daily she is also on Lantus 60 units subcutaneous daily. She reports fasting blood sugars of 90-130. She's not checking 2 hour postprandial sugars. At last office visit her A1c was down to 8.9. We tried to start her on metformin but it made her extremely sick. We tried adding NovoLog at dinnertime but that couple glimepiride cause hypoglycemic episodes. She is here today to discuss further options.  Past Medical History  Diagnosis Date  . Diabetes mellitus   . Hypertension   . Dyslipidemia   . Varicose veins   . Migraines   . GERD (gastroesophageal reflux disease)   . RSD (reflex sympathetic dystrophy) 2007    R wrist/hand following fx   Current Outpatient Prescriptions on File Prior to Visit  Medication Sig Dispense Refill  . calcium carbonate (OS-CAL) 600 MG TABS Take 600 mg by mouth 2 (two) times daily with meals.        . fish oil-omega-3 fatty acids 1000 MG capsule Take 2 g by mouth 2 (two) times daily.        Marland Kitchen gemfibrozil (LOPID) 600 MG tablet TAKE 1 TABLET BY MOUTH TWICE DAILY  60 tablet  11  . glimepiride (AMARYL) 4  MG tablet Take 1 tablet (4 mg total) by mouth 2 (two) times daily.  60 tablet  2  . glucose blood test strip She has Freestyle monitor  100 each  11  . insulin glargine (LANTUS) 100 UNIT/ML injection Inject 60 Units into the skin daily with breakfast.      . INSULIN SYRINGE .5CC/29G 29G X 1/2" 0.5 ML MISC AS DIRECTED  100 each  3  . losartan (COZAAR) 50 MG tablet Take 1 tablet (50 mg total) by mouth daily.  90 tablet  0  . metoprolol (LOPRESSOR) 50 MG tablet Take 1 tablet (50 mg total) by mouth 2 (two) times daily.  90 tablet  2  . Multiple Vitamin (MULTIVITAMIN) tablet Take 1 tablet by mouth daily.        Marland Kitchen omeprazole (PRILOSEC) 20 MG capsule TAKE ONE CAPSULE BY MOUTH DAILY  30 capsule  0  . potassium chloride SA (K-DUR,KLOR-CON) 20 MEQ tablet TAKE 1 TABLET BY MOUTH TWICE DAILY  180 tablet  3  . Pyridoxine HCl (VITAMIN B-6) 100 MG tablet Take 100 mg by mouth daily.         No current facility-administered medications on file prior to visit.   Allergies  Allergen Reactions  . Lipitor (Atorvastatin Calcium) Diarrhea  . Macrobid (Nitrofurantoin Macrocrystal) Other (See Comments)    unknown  . Niacin And Related Hives   History  Social History  . Marital Status: Married    Spouse Name: N/A    Number of Children: N/A  . Years of Education: N/A   Occupational History  . Not on file.   Social History Main Topics  . Smoking status: Never Smoker   . Smokeless tobacco: Not on file  . Alcohol Use: No  . Drug Use: No  . Sexually Active: Not on file   Other Topics Concern  . 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       Objective:   Physical Exam  Vitals reviewed. Constitutional: She is oriented to person, place, and time. She appears well-developed and well-nourished.   Cardiovascular: Normal rate, regular rhythm and normal heart sounds.  Exam reveals no gallop.   No murmur heard. Pulmonary/Chest: Effort normal and breath sounds normal. No respiratory distress. She has no wheezes. She has no rales. She exhibits no tenderness.  Abdominal: Soft. Bowel sounds are normal. She exhibits no distension. There is no tenderness. There is no rebound.  Neurological: She is alert and oriented to person, place, and time. She has normal reflexes. She displays normal reflexes. No cranial nerve deficit. She exhibits normal muscle tone. Coordination normal.          Assessment & Plan:  1. Type II or unspecified type diabetes mellitus without mention of complication, uncontrolled I asked the patient to check fasting blood sugars into postprandial sugars. She is to report to me these values in 2 weeks. Postprandial sugar is less than 160. It is not less than 160, are likely add NovoLog 7 units with supper at night. - COMPLETE METABOLIC PANEL WITH GFR; Future - Hemoglobin A1c; Future  2. Hypertriglyceridemia Return fasting for a fasting lipid panel. Her goal glycerides are less than 250. The patient is not goal, I would consider trying pravastatin. She has a history of intolerance to Lipitor - Lipid panel; Future #3 hypertension Increase Cozaar to 100 mg by mouth daily recheck blood pressures in 2 weeks.

## 2012-12-27 ENCOUNTER — Other Ambulatory Visit: Payer: Self-pay | Admitting: Internal Medicine

## 2013-01-01 ENCOUNTER — Other Ambulatory Visit (INDEPENDENT_AMBULATORY_CARE_PROVIDER_SITE_OTHER): Payer: 59

## 2013-01-01 DIAGNOSIS — IMO0001 Reserved for inherently not codable concepts without codable children: Secondary | ICD-10-CM

## 2013-01-01 DIAGNOSIS — E781 Pure hyperglyceridemia: Secondary | ICD-10-CM

## 2013-01-01 LAB — COMPLETE METABOLIC PANEL WITH GFR
ALT: 21 U/L (ref 0–35)
AST: 30 U/L (ref 0–37)
Albumin: 3.9 g/dL (ref 3.5–5.2)
Alkaline Phosphatase: 73 U/L (ref 39–117)
BUN: 28 mg/dL — ABNORMAL HIGH (ref 6–23)
CO2: 26 mEq/L (ref 19–32)
Calcium: 10.2 mg/dL (ref 8.4–10.5)
Chloride: 106 mEq/L (ref 96–112)
Creat: 0.96 mg/dL (ref 0.50–1.10)
GFR, Est African American: 64 mL/min
GFR, Est Non African American: 56 mL/min — ABNORMAL LOW
Glucose, Bld: 210 mg/dL — ABNORMAL HIGH (ref 70–99)
Potassium: 5.5 mEq/L — ABNORMAL HIGH (ref 3.5–5.3)
Sodium: 142 mEq/L (ref 135–145)
Total Bilirubin: 0.4 mg/dL (ref 0.3–1.2)
Total Protein: 7.3 g/dL (ref 6.0–8.3)

## 2013-01-01 LAB — LIPID PANEL
Cholesterol: 195 mg/dL (ref 0–200)
HDL: 28 mg/dL — ABNORMAL LOW (ref >39)
LDL Cholesterol: 87 mg/dL (ref 0–99)
Total CHOL/HDL Ratio: 7 Ratio
Triglycerides: 398 mg/dL — ABNORMAL HIGH (ref <150)
VLDL: 80 mg/dL — ABNORMAL HIGH (ref 0–40)

## 2013-01-01 LAB — HEMOGLOBIN A1C
Hgb A1c MFr Bld: 8.4 % — ABNORMAL HIGH (ref <5.7)
Mean Plasma Glucose: 194 mg/dL — ABNORMAL HIGH (ref <117)

## 2013-01-15 ENCOUNTER — Ambulatory Visit (INDEPENDENT_AMBULATORY_CARE_PROVIDER_SITE_OTHER): Payer: 59 | Admitting: Family Medicine

## 2013-01-15 ENCOUNTER — Encounter: Payer: Self-pay | Admitting: Family Medicine

## 2013-01-15 VITALS — BP 140/80 | HR 68 | Temp 97.8°F | Resp 16 | Wt 163.0 lb

## 2013-01-15 DIAGNOSIS — IMO0001 Reserved for inherently not codable concepts without codable children: Secondary | ICD-10-CM

## 2013-01-15 DIAGNOSIS — I10 Essential (primary) hypertension: Secondary | ICD-10-CM

## 2013-01-15 DIAGNOSIS — E781 Pure hyperglyceridemia: Secondary | ICD-10-CM

## 2013-01-15 NOTE — Progress Notes (Signed)
Subjective:    Patient ID: Rebecca Perkins, female    DOB: 09-18-1928, 79 y.o.   MRN: [id number]  HPI Patient is here for followup on insulin-dependent diabetes mellitus. She's currently taking Lantus 60 units every morning and NovoLog 7 units at supper. Her fasting blood sugars range 100-160 and are mainly below 130. She is not checking her 2 hour postprandial sugars. She does have an occasional episode of hypoglycemia.  Her blood pressure continues to be elevated in the 140s to 160s over 80s. She's currently taking Lopressor 50 mg tablets. She takes one fourth of a tablet twice a day and losartan 100 mg by mouth daily she denies chest pain shortness of breath or dyspnea on exertion.  Triglycerides fell from 1400 to 400 on fenofibrate 135 lmg poqday.  She denies myalgias or right upper quadrant pain Past Medical History  Diagnosis Date  . Diabetes mellitus   . Hypertension   . Dyslipidemia   . Varicose veins   . Migraines   . GERD (gastroesophageal reflux disease)   . RSD (reflex sympathetic dystrophy) 2007    R wrist/hand following fx   Current Outpatient Prescriptions on File Prior to Visit  Medication Sig Dispense Refill  . calcium carbonate (OS-CAL) 600 MG TABS Take 600 mg by mouth 2 (two) times daily with meals.        . Choline Fenofibrate 135 MG capsule Take 135 mg by mouth daily.      . fish oil-omega-3 fatty acids 1000 MG capsule Take 2 g by mouth 2 (two) times daily.        Marland Kitchen glucose blood test strip She has Freestyle monitor  100 each  11  . insulin glargine (LANTUS) 100 UNIT/ML injection Inject 60 Units into the skin daily with breakfast.      . INSULIN SYRINGE .5CC/29G 29G X 1/2" 0.5 ML MISC AS DIRECTED  100 each  3  . Multiple Vitamin (MULTIVITAMIN) tablet Take 1 tablet by mouth daily.        Marland Kitchen omeprazole (PRILOSEC) 20 MG capsule TAKE 1 CAPSULE BY MOUTH EVERY DAY  30 capsule  3  . potassium chloride SA (K-DUR,KLOR-CON) 20 MEQ tablet TAKE 1 TABLET BY MOUTH TWICE DAILY   180 tablet  3  . Pyridoxine HCl (VITAMIN B-6) 100 MG tablet Take 100 mg by mouth daily.         No current facility-administered medications on file prior to visit.   Allergies  Allergen Reactions  . Lipitor (Atorvastatin Calcium) Diarrhea  . Macrobid (Nitrofurantoin Macrocrystal) Other (See Comments)    unknown  . Niacin And Related Hives   History   Social History  . Marital Status: Married    Spouse Name: N/A    Number of Children: N/A  . Years of Education: N/A   Occupational History  . Not on file.   Social History Main Topics  . Smoking status: Never Smoker   . Smokeless tobacco: Not on file  . Alcohol Use: No  . Drug Use: No  . Sexually Active: Not on file   Other Topics Concern  . 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       Objective:   Physical Exam  Vitals reviewed. Constitutional: She is oriented to  person, place, and time.  Cardiovascular: Normal rate, regular rhythm and normal heart sounds.   No murmur heard. Pulmonary/Chest: Effort normal and breath sounds normal. No respiratory distress. She has no wheezes. She has no rales.  Abdominal: Soft. Bowel sounds are normal. She exhibits no distension. There is no tenderness. There is no rebound.  Neurological: She is alert and oriented to person, place, and time. She has normal reflexes. No cranial nerve deficit.  Skin: No rash noted. No erythema. No pallor.          Assessment & Plan:  HTN (hypertension)  Hypertriglyceridemia  Type II or unspecified type diabetes mellitus without mention of complication, uncontrolled  Increase Lopressor to one half a tablet by mouth twice a day and recheck blood pressures in 2 weeks. Also asked the patient to check two-hour postprandial sugars and allow me to  review her sugars in 2 weeks. My goal for this patient is to keep her blood sugars between 100-200.   Triglycerides are now acceptable and should be rechecked in 3 months.

## 2013-01-29 ENCOUNTER — Other Ambulatory Visit: Payer: Self-pay | Admitting: *Deleted

## 2013-02-06 ENCOUNTER — Other Ambulatory Visit: Payer: Self-pay | Admitting: *Deleted

## 2013-02-06 ENCOUNTER — Telehealth: Payer: Self-pay | Admitting: Family Medicine

## 2013-02-06 MED ORDER — LOSARTAN POTASSIUM 100 MG PO TABS: 100.0000 mg | ORAL_TABLET | Freq: Every day | ORAL | Status: DC

## 2013-02-06 NOTE — Telephone Encounter (Signed)
Rx Refilled  

## 2013-02-08 ENCOUNTER — Other Ambulatory Visit: Payer: Self-pay

## 2013-02-13 ENCOUNTER — Other Ambulatory Visit: Payer: Self-pay | Admitting: Family Medicine

## 2013-02-13 MED ORDER — AMLODIPINE BESYLATE 5 MG PO TABS: 5.0000 mg | ORAL_TABLET | Freq: Every day | ORAL | Status: DC

## 2013-02-13 NOTE — Telephone Encounter (Signed)
Pt is aware to add Norvasc 5 mg and to recheck in 1 month. She is taking a snack at night after her meals.

## 2013-03-01 ENCOUNTER — Other Ambulatory Visit: Payer: Self-pay | Admitting: Family Medicine

## 2013-03-01 ENCOUNTER — Telehealth: Payer: Self-pay | Admitting: Family Medicine

## 2013-03-01 MED ORDER — CHOLINE FENOFIBRATE 135 MG PO CPDR: 135.0000 mg | DELAYED_RELEASE_CAPSULE | Freq: Every day | ORAL | Status: DC

## 2013-03-01 NOTE — Telephone Encounter (Signed)
Rx Refilled  

## 2013-03-02 NOTE — Telephone Encounter (Signed)
Medication refilled per protocol. 

## 2013-04-06 ENCOUNTER — Other Ambulatory Visit: Payer: Self-pay | Admitting: Family Medicine

## 2013-04-24 LAB — HM DIABETES EYE EXAM

## 2013-05-03 ENCOUNTER — Other Ambulatory Visit: Payer: Self-pay | Admitting: Internal Medicine

## 2013-05-03 ENCOUNTER — Other Ambulatory Visit: Payer: Self-pay | Admitting: Family Medicine

## 2013-05-04 ENCOUNTER — Ambulatory Visit (INDEPENDENT_AMBULATORY_CARE_PROVIDER_SITE_OTHER): Payer: 59 | Admitting: Family Medicine

## 2013-05-04 ENCOUNTER — Encounter: Payer: Self-pay | Admitting: Family Medicine

## 2013-05-04 VITALS — BP 126/78 | HR 74 | Temp 98.0°F | Resp 18 | Ht 64.0 in | Wt 161.0 lb

## 2013-05-04 DIAGNOSIS — IMO0001 Reserved for inherently not codable concepts without codable children: Secondary | ICD-10-CM

## 2013-05-04 LAB — SEDIMENTATION RATE: Sed Rate: 7 mm/hr (ref 0–22)

## 2013-05-04 NOTE — Progress Notes (Signed)
Subjective:    Patient ID: Rebecca Perkins, female    DOB: May 11, 1929, 79 y.o.   MRN: [id number]  HPI Patient reports several months of pain and weakness in both legs. She states that her legs are starting to give out on her more easily. She cannot walk as far as she wants to without pain and fatigue in her legs. She reports some symptoms of claudication and also cramping with walking. She is also having pain and cramping even at rest. This seemed to coincide with the beginning of Fenfibrate.  She denies any myalgias over the rest of her body. She denies any fevers, rashes, headaches. She denies any shoulder pain. She does report some low back pain and occasional numbness in her left thigh. She denies any true sciatica. She denies any chest pain Past Medical History  Diagnosis Date  . Diabetes mellitus   . Hypertension   . Dyslipidemia   . Varicose veins   . Migraines   . GERD (gastroesophageal reflux disease)   . RSD (reflex sympathetic dystrophy) 2007    R wrist/hand following fx   Past Surgical History  Procedure Laterality Date  . Umbilical hernia repair    . Breast cyst excision    . Tubal ligation    . Cholecystectomy  1989  . Appendectomy  1966  . Abdominal hysterectomy  1988  . Several benign cyst removed      last 1 in 1972  . Cataract extraction  10/2009   Current Outpatient Prescriptions on File Prior to Visit  Medication Sig Dispense Refill  . amLODipine (NORVASC) 5 MG tablet TAKE 1 TABLET BY MOUTH DAILY  30 tablet  0  . calcium carbonate (OS-CAL) 600 MG TABS Take 600 mg by mouth 2 (two) times daily with meals.        . Choline Fenofibrate 135 MG capsule Take 1 capsule (135 mg total) by mouth daily.  30 capsule  5  . fish oil-omega-3 fatty acids 1000 MG capsule Take 2 g by mouth 2 (two) times daily.        Marland Kitchen glucose blood test strip She has Freestyle monitor  100 each  11  . Insulin Aspart (NOVOLOG FLEXPEN [place]) Inject 7 Units into the skin. q supper time      .  insulin glargine (LANTUS) 100 UNIT/ML injection Inject 60 Units into the skin daily with breakfast.      . INSULIN SYRINGE .5CC/29G 29G X 1/2" 0.5 ML MISC AS DIRECTED  100 each  3  . losartan (COZAAR) 100 MG tablet Take 1 tablet (100 mg total) by mouth daily.  30 tablet  5  . metoprolol (LOPRESSOR) 50 MG tablet Take 50 mg by mouth. 1/4 tab po bid      . Multiple Vitamin (MULTIVITAMIN) tablet Take 1 tablet by mouth daily.        Marland Kitchen omeprazole (PRILOSEC) 20 MG capsule TAKE 1 CAPSULE BY MOUTH EVERY DAY  30 capsule  3  . potassium chloride SA (K-DUR,KLOR-CON) 20 MEQ tablet TAKE 1 TABLET BY MOUTH TWICE DAILY  180 tablet  0  . Pyridoxine HCl (VITAMIN B-6) 100 MG tablet Take 100 mg by mouth daily.         No current facility-administered medications on file prior to visit.   Allergies  Allergen Reactions  . Lipitor [Atorvastatin Calcium] Diarrhea  . Macrobid [Nitrofurantoin Macrocrystal] Other (See Comments)    unknown  . Niacin And Related Hives   History   Social  History  . Marital Status: Married    Spouse Name: N/A    Number of Children: N/A  . Years of Education: N/A   Occupational History  . Not on file.   Social History Main Topics  . Smoking status: Never Smoker   . Smokeless tobacco: Not on file  . Alcohol Use: No  . Drug Use: No  . Sexual Activity: Not on file   Other Topics Concern  . 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       Objective:   Physical Exam  Vitals reviewed. Cardiovascular: Normal rate, regular rhythm and intact distal pulses.   Pulmonary/Chest: Effort normal and breath sounds normal.  Abdominal: Soft. Bowel sounds are normal.  Musculoskeletal: She exhibits no edema.          Assessment & Plan:  1. Myalgia and myositis Differential  diagnosis includes myalgias due to cholesterol meds, neurogenic claudication, peripheral vascular disease, autoimmune disease and myositis. I will check a CK and sedimentation rate. I have asked the patient to temporarily discontinue the [facility]. If symptoms persist in one week and her labs are normal I would next go to an MRI of the lumbar spine to look for neurogenic claudication.  If that is normal I would begin workup for peripheral vascular disease . - COMPLETE METABOLIC PANEL WITH GFR - CK - Sedimentation rate

## 2013-05-05 LAB — COMPLETE METABOLIC PANEL WITHOUT GFR
ALT: 16 U/L (ref 0–35)
AST: 18 U/L (ref 0–37)
Albumin: 3.9 g/dL (ref 3.5–5.2)
Alkaline Phosphatase: 76 U/L (ref 39–117)
BUN: 18 mg/dL (ref 6–23)
CO2: 26 meq/L (ref 19–32)
Calcium: 9.5 mg/dL (ref 8.4–10.5)
Chloride: 105 meq/L (ref 96–112)
Creat: 0.98 mg/dL (ref 0.50–1.10)
GFR, Est African American: 63 mL/min
GFR, Est Non African American: 54 mL/min — ABNORMAL LOW
Glucose, Bld: 214 mg/dL — ABNORMAL HIGH (ref 70–99)
Potassium: 3.9 meq/L (ref 3.5–5.3)
Sodium: 139 meq/L (ref 135–145)
Total Bilirubin: 0.3 mg/dL (ref 0.3–1.2)
Total Protein: 6.7 g/dL (ref 6.0–8.3)

## 2013-05-05 LAB — CK: Total CK: 37 U/L (ref 7–177)

## 2013-05-15 ENCOUNTER — Telehealth: Payer: Self-pay | Admitting: Family Medicine

## 2013-05-15 NOTE — Telephone Encounter (Signed)
Patient calling in regards to results for blood work and to let Dr.Pickard know that the medicine that he told her to stop taking is not the problem . She is still having the same problems.

## 2013-05-16 ENCOUNTER — Encounter: Payer: Self-pay | Admitting: Family Medicine

## 2013-05-25 ENCOUNTER — Other Ambulatory Visit: Payer: Self-pay | Admitting: Family Medicine

## 2013-05-25 MED ORDER — OMEPRAZOLE 20 MG PO CPDR: DELAYED_RELEASE_CAPSULE | ORAL | Status: DC

## 2013-05-30 ENCOUNTER — Telehealth: Payer: Self-pay | Admitting: Family Medicine

## 2013-05-30 DIAGNOSIS — Z79899 Other long term (current) drug therapy: Secondary | ICD-10-CM

## 2013-05-30 DIAGNOSIS — E781 Pure hyperglyceridemia: Secondary | ICD-10-CM

## 2013-05-30 NOTE — Telephone Encounter (Signed)
Pt has sent note to Dr Tanya Nones about reaction to cholesterol fenofibrate medication.  She reports that since stopping her symptoms have resolved.  She no longer has the leg cramps and worn out feeling.  The provider still would like to give her something for her elevated triglycerides.  Pt asked ,per provider, if she would be willing to try Crestor 10 mg.  She stated she would.  We have provider her some samples to try.  She will pick up today.  Told patient to try them.  If she feels she is having any reaction to Crestor, to contact us right away.

## 2013-06-05 ENCOUNTER — Other Ambulatory Visit: Payer: Self-pay | Admitting: Family Medicine

## 2013-06-06 ENCOUNTER — Telehealth: Payer: Self-pay | Admitting: Family Medicine

## 2013-06-06 NOTE — Telephone Encounter (Signed)
Patient started her Crestor  On Saturday . This morning when she woke up her legs were swollen and somewhat confused .  Which is not normal for her . She has also had some cramps. On a scale from 1-10  (3) she said when this is happening that she has to stop and hold her legs . She said she did not take any this morning. Please call and advise.  Patient will be home until 3:15 pm . She has another appointment to go to.

## 2013-06-06 NOTE — Telephone Encounter (Signed)
Spoke to pt, she is aware you are not here today, I told her to hold Crestor until she hears back from [place] tomorrow.

## 2013-06-07 NOTE — Telephone Encounter (Signed)
Patient aware.

## 2013-06-07 NOTE — Telephone Encounter (Signed)
Hold the crestor for 3 days then try again but take  1/2 tab a day and call me back in 2 weeks.

## 2013-06-15 ENCOUNTER — Telehealth: Payer: Self-pay | Admitting: Family Medicine

## 2013-06-15 ENCOUNTER — Other Ambulatory Visit: Payer: 59

## 2013-06-15 DIAGNOSIS — N390 Urinary tract infection, site not specified: Secondary | ICD-10-CM

## 2013-06-15 LAB — URINALYSIS, ROUTINE W REFLEX MICROSCOPIC
Bilirubin Urine: NEGATIVE
Glucose, UA: >1000 mg/dL — AB
Ketones, ur: NEGATIVE mg/dL
Nitrite: POSITIVE — AB
Protein, ur: NEGATIVE mg/dL
Specific Gravity, Urine: 1.02 (ref 1.005–1.030)
Urobilinogen, UA: 0.2 mg/dL (ref 0.0–1.0)
pH: 5 (ref 5.0–8.0)

## 2013-06-15 LAB — URINALYSIS, MICROSCOPIC ONLY
Casts: NONE SEEN
Crystals: NONE SEEN

## 2013-06-15 MED ORDER — CIPROFLOXACIN HCL 500 MG PO TABS: 500.0000 mg | ORAL_TABLET | Freq: Two times a day (BID) | ORAL | Status: DC

## 2013-06-15 NOTE — Telephone Encounter (Signed)
.  Patient aware and med sent to pharmacy.   Pt thinks she has a UTI, pt left sample and MBD reviewed. Pt needs to be on Cipro 500mg  bid x 7 days for UTI.

## 2013-06-18 LAB — URINE CULTURE: Culture: >=100000

## 2013-06-20 ENCOUNTER — Telehealth: Payer: Self-pay | Admitting: Family Medicine

## 2013-06-20 NOTE — Telephone Encounter (Signed)
Pt states that she did cut her cholesterol med in half and she still had side effects from it - diarrhea, bad cramps and just not feeling well in general. She stopped it once again and her symptoms has almost completely resolved. What would you like for her to do now?

## 2013-06-21 NOTE — Telephone Encounter (Signed)
Would she be willing to try pravastatin 40 poqday.

## 2013-06-22 ENCOUNTER — Other Ambulatory Visit: Payer: Self-pay | Admitting: Internal Medicine

## 2013-06-25 NOTE — Telephone Encounter (Signed)
LMTRC

## 2013-06-27 ENCOUNTER — Other Ambulatory Visit: Payer: Self-pay | Admitting: Family Medicine

## 2013-06-27 NOTE — Telephone Encounter (Signed)
LMTRC

## 2013-06-27 NOTE — Telephone Encounter (Signed)
Corey is returning [place] call Call back [phone]

## 2013-07-02 ENCOUNTER — Other Ambulatory Visit: Payer: Self-pay | Admitting: Family Medicine

## 2013-07-02 NOTE — Telephone Encounter (Signed)
Medication refilled per protocol. 

## 2013-08-08 ENCOUNTER — Other Ambulatory Visit: Payer: Self-pay | Admitting: Family Medicine

## 2013-08-13 ENCOUNTER — Ambulatory Visit (INDEPENDENT_AMBULATORY_CARE_PROVIDER_SITE_OTHER): Payer: 59 | Admitting: Family Medicine

## 2013-08-13 ENCOUNTER — Encounter: Payer: Self-pay | Admitting: Family Medicine

## 2013-08-13 VITALS — BP 152/86 | HR 64 | Temp 97.4°F | Resp 18 | Wt 162.0 lb

## 2013-08-13 DIAGNOSIS — IMO0002 Reserved for concepts with insufficient information to code with codable children: Secondary | ICD-10-CM

## 2013-08-13 DIAGNOSIS — E1165 Type 2 diabetes mellitus with hyperglycemia: Secondary | ICD-10-CM

## 2013-08-13 DIAGNOSIS — M7989 Other specified soft tissue disorders: Secondary | ICD-10-CM

## 2013-08-13 DIAGNOSIS — Z23 Encounter for immunization: Secondary | ICD-10-CM

## 2013-08-13 DIAGNOSIS — IMO0001 Reserved for inherently not codable concepts without codable children: Secondary | ICD-10-CM

## 2013-08-13 DIAGNOSIS — E785 Hyperlipidemia, unspecified: Secondary | ICD-10-CM

## 2013-08-13 MED ORDER — FUROSEMIDE 40 MG PO TABS: 40.0000 mg | ORAL_TABLET | Freq: Every day | ORAL | Status: DC

## 2013-08-13 NOTE — Progress Notes (Signed)
Subjective:    Patient ID: Rebecca Perkins, female    DOB: 01/08/1929, 80 y.o.   MRN: [id number]  HPI 05/04/13 Patient reports several months of pain and weakness in both legs. She states that her legs are starting to give out on her more easily. She cannot walk as far as she wants to without pain and fatigue in her legs. She reports some symptoms of claudication and also cramping with walking. She is also having pain and cramping even at rest. This seemed to coincide with the beginning of Fenfibrate.  She denies any myalgias over the rest of her body. She denies any fevers, rashes, headaches. She denies any shoulder pain. She does report some low back pain and occasional numbness in her left thigh. She denies any true sciatica. She denies any chest pain.  At that time, my plan was: 1. Myalgia and myositis Differential diagnosis includes myalgias due to cholesterol meds, neurogenic claudication, peripheral vascular disease, autoimmune disease and myositis. I will check a CK and sedimentation rate. I have asked the patient to temporarily discontinue the [facility]. If symptoms persist in one week and her labs are normal I would next go to an MRI of the lumbar spine to look for neurogenic claudication.  If that is normal I would begin workup for peripheral vascular disease . - COMPLETE METABOLIC PANEL WITH GFR - CK - Sedimentation rate  08/13/13 The pain in the patient's legs discontinued after we stop fenofibrate. Unfortunately she was not able to tolerate Crestor. Therefore she is on no cholesterol medication over the last 3 months. Furthermore we have not rechecked her hemoglobin A1c since June. She is overdue for hemoglobin A1c as well as a fasting lipid panel. She is also due for Prevnar 13. Her blood pressure is elevated today 152/86. Her primary complaint is swelling in both legs. She is +1 edema in both legs level of her knees. She has severe varicosities in both legs and signs of chronic venous  insufficiency. She is currently not wearing compression stockings due to discomfort but recalls. She also complains of pain and sensitivity in the skin when her legs swell. Past Medical History  Diagnosis Date  . Diabetes mellitus   . Hypertension   . Dyslipidemia   . Varicose veins   . Migraines   . GERD (gastroesophageal reflux disease)   . RSD (reflex sympathetic dystrophy) 2007    R wrist/hand following fx   Past Surgical History  Procedure Laterality Date  . Umbilical hernia repair    . Breast cyst excision    . Tubal ligation    . Cholecystectomy  1989  . Appendectomy  1966  . Abdominal hysterectomy  1988  . Several benign cyst removed      last 1 in 1972  . Cataract extraction  10/2009   Current Outpatient Prescriptions on File Prior to Visit  Medication Sig Dispense Refill  . amLODipine (NORVASC) 5 MG tablet TAKE 1 TABLET BY MOUTH EVERY DAY  30 tablet  3  . B-D ULTRAFINE III SHORT PEN 31G X 8 MM MISC USE AS DIRECTED  100 each  5  . calcium carbonate (OS-CAL) 600 MG TABS Take 600 mg by mouth 2 (two) times daily with meals.        . fish oil-omega-3 fatty acids 1000 MG capsule Take 2 g by mouth 2 (two) times daily.        Marland Kitchen FREESTYLE TEST STRIPS test strip       .  Insulin Aspart (NOVOLOG FLEXPEN [place]) Inject 7 Units into the skin. q supper time      . insulin glargine (LANTUS) 100 UNIT/ML injection Inject 60 Units into the skin daily with breakfast.      . INSULIN SYRINGE .5CC/29G 29G X 1/2" 0.5 ML MISC AS DIRECTED  100 each  3  . INSULIN SYRINGE 1CC/29G 29G X 1/2" 1 ML MISC USE AS DIRECTED  100 each  0  . losartan (COZAAR) 100 MG tablet TAKE 1 TABLET BY MOUTH EVERY DAY  30 tablet  5  . metoprolol (LOPRESSOR) 50 MG tablet Take 25 mg by mouth 2 (two) times daily. 1/2 of 50 mg tablet BID      . Multiple Vitamin (MULTIVITAMIN) tablet Take 1 tablet by mouth daily.        Marland Kitchen omeprazole (PRILOSEC) 20 MG capsule TAKE 1 CAPSULE BY MOUTH EVERY DAY  30 capsule  3  . potassium chloride  SA (K-DUR,KLOR-CON) 20 MEQ tablet TAKE 1 TABLET BY MOUTH TWICE DAILY  180 tablet  0  . Pyridoxine HCl (VITAMIN B-6) 100 MG tablet Take 100 mg by mouth daily.         No current facility-administered medications on file prior to visit.   Allergies  Allergen Reactions  . Lipitor [Atorvastatin Calcium] Diarrhea  . Macrobid [Nitrofurantoin Macrocrystal] Other (See Comments)    unknown  . Niacin And Related Hives  . Statins Diarrhea   History   Social History  . Marital Status: Married    Spouse Name: N/A    Number of Children: N/A  . Years of Education: N/A   Occupational History  . Not on file.   Social History Main Topics  . Smoking status: Never Smoker   . Smokeless tobacco: Never Used  . Alcohol Use: No  . Drug Use: No  . Sexual Activity: Not on file   Other Topics Concern  . 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       Objective:   Physical Exam  Vitals reviewed. Cardiovascular: Normal rate, regular rhythm and intact distal pulses.   Pulmonary/Chest: Effort normal and breath sounds normal.  Abdominal: Soft. Bowel sounds are normal.  Musculoskeletal: She exhibits no edema.    +1 edema in both legs to the level of the knees with severe varicosities in both legs.      Assessment & Plan:  Diabetes mellitus type II, uncontrolled - Plan: COMPLETE METABOLIC PANEL WITH GFR, Lipid panel, Hemoglobin A1c  Dyslipidemia - Plan: COMPLETE METABOLIC PANEL WITH GFR, Lipid panel  Leg swelling - Plan: furosemide (LASIX) 40 MG tablet   Patient's leg swelling is due to chronic venous insufficiency. Began the patient on Lasix 40 mg by mouth daily as needed for leg swelling. Also recommended the patient wear compression stockings. I am hopeful that the mild diuresis will also  help her blood pressure. I asked the patient return fasting for hemoglobin A1c as well as a fasting lipid panel. She will return in one week for that I can also recheck her potassium on a fluid pill. I also recommended Prevnar 13 and the patient received that today in clinic

## 2013-08-13 NOTE — Addendum Note (Signed)
Addended by: Olena Mater on: 08/13/2013 11:42 AM   Modules accepted: Orders

## 2013-08-15 ENCOUNTER — Other Ambulatory Visit: Payer: Self-pay | Admitting: Family Medicine

## 2013-08-20 ENCOUNTER — Ambulatory Visit: Payer: 59 | Admitting: Physician Assistant

## 2013-08-20 ENCOUNTER — Other Ambulatory Visit: Payer: 59

## 2013-08-20 LAB — COMPLETE METABOLIC PANEL WITH GFR
ALT: 15 U/L (ref 0–35)
AST: 26 U/L (ref 0–37)
Albumin: 3.6 g/dL (ref 3.5–5.2)
Alkaline Phosphatase: 83 U/L (ref 39–117)
BUN: 25 mg/dL — ABNORMAL HIGH (ref 6–23)
CO2: 24 mEq/L (ref 19–32)
Calcium: 9.4 mg/dL (ref 8.4–10.5)
Chloride: 99 mEq/L (ref 96–112)
Creat: 0.57 mg/dL (ref 0.50–1.10)
GFR, Est African American: >89 mL/min
GFR, Est Non African American: 87 mL/min
Glucose, Bld: 216 mg/dL — ABNORMAL HIGH (ref 70–99)
Potassium: 4.1 mEq/L (ref 3.5–5.3)
Sodium: 134 mEq/L — ABNORMAL LOW (ref 135–145)
Total Bilirubin: 0.5 mg/dL (ref 0.3–1.2)
Total Protein: 6.3 g/dL (ref 6.0–8.3)

## 2013-08-20 LAB — LIPID PANEL
Cholesterol: 706 mg/dL — ABNORMAL HIGH (ref 0–200)
HDL: 25 mg/dL — ABNORMAL LOW (ref >39)
Total CHOL/HDL Ratio: 28.2 Ratio
Triglycerides: 3820 mg/dL — ABNORMAL HIGH (ref <150)

## 2013-08-20 LAB — HEMOGLOBIN A1C
Hgb A1c MFr Bld: 9.7 % — ABNORMAL HIGH (ref <5.7)
Mean Plasma Glucose: 232 mg/dL — ABNORMAL HIGH (ref <117)

## 2013-08-23 ENCOUNTER — Ambulatory Visit (INDEPENDENT_AMBULATORY_CARE_PROVIDER_SITE_OTHER): Payer: 59 | Admitting: Family Medicine

## 2013-08-23 ENCOUNTER — Encounter: Payer: Self-pay | Admitting: Family Medicine

## 2013-08-23 VITALS — BP 110/64 | HR 78 | Temp 98.4°F | Resp 16 | Ht 64.0 in | Wt 158.0 lb

## 2013-08-23 DIAGNOSIS — IMO0001 Reserved for inherently not codable concepts without codable children: Secondary | ICD-10-CM

## 2013-08-23 DIAGNOSIS — E781 Pure hyperglyceridemia: Secondary | ICD-10-CM

## 2013-08-23 DIAGNOSIS — M549 Dorsalgia, unspecified: Secondary | ICD-10-CM

## 2013-08-23 DIAGNOSIS — E1165 Type 2 diabetes mellitus with hyperglycemia: Secondary | ICD-10-CM

## 2013-08-23 LAB — URINALYSIS, MICROSCOPIC ONLY
Casts: NONE SEEN
Crystals: NONE SEEN

## 2013-08-23 LAB — URINALYSIS, ROUTINE W REFLEX MICROSCOPIC
Bilirubin Urine: NEGATIVE
Glucose, UA: 500 mg/dL — AB
Ketones, ur: NEGATIVE mg/dL
Nitrite: NEGATIVE
Protein, ur: 30 mg/dL — AB
Specific Gravity, Urine: >1.03 — ABNORMAL HIGH (ref 1.005–1.030)
Urobilinogen, UA: 0.2 mg/dL (ref 0.0–1.0)
pH: 6 (ref 5.0–8.0)

## 2013-08-23 MED ORDER — GEMFIBROZIL 600 MG PO TABS: 600.0000 mg | ORAL_TABLET | Freq: Two times a day (BID) | ORAL | Status: DC

## 2013-08-23 MED ORDER — CIPROFLOXACIN HCL 500 MG PO TABS: 500.0000 mg | ORAL_TABLET | Freq: Two times a day (BID) | ORAL | Status: DC

## 2013-08-23 NOTE — Progress Notes (Signed)
Subjective:    Patient ID: Rebecca Perkins, female    DOB: August 21, 1928, 80 y.o.   MRN: [id number]  HPI Patient is a very pleasant 80 year old white female who has a history of significant hypertriglyceridemia and dyslipidemia. She has failed many statins including simvastatin, Lipitor, Crestor. She failed fenofibrate. She has never tried livalo, zetia.  In the past she did well on gemfibrozil.  Over Christmas she has been eating with indiscretion.  She has forgotten her insulin to 3 days every week.  Her most recent lab work is listed below: No visits with results within 1 Week(s) from this visit. Latest known visit with results is:  Office Visit on 08/13/2013  Component Date Value Range Status  . Sodium 08/20/2013 134* 135 - 145 mEq/L Final  . Potassium 08/20/2013 4.1  3.5 - 5.3 mEq/L Final  . Chloride 08/20/2013 99  96 - 112 mEq/L Final  . CO2 08/20/2013 24  19 - 32 mEq/L Final  . Glucose, Bld 08/20/2013 216* 70 - 99 mg/dL Final  . BUN 08/20/2013 25* 6 - 23 mg/dL Final   Specimen ultracentrifuged due to lipemia.  . Creat 08/20/2013 0.57  0.50 - 1.10 mg/dL Final  . Total Bilirubin 08/20/2013 0.5  0.3 - 1.2 mg/dL Final  . Alkaline Phosphatase 08/20/2013 83  39 - 117 U/L Final   Specimen ultracentrifuged due to lipemia.  . AST 08/20/2013 26  0 - 37 U/L Final   Specimen ultracentrifuged due to lipemia.  Marland Kitchen ALT 08/20/2013 15  0 - 35 U/L Final  . Total Protein 08/20/2013 6.3  6.0 - 8.3 g/dL Final  . Albumin 08/20/2013 3.6  3.5 - 5.2 g/dL Final  . Calcium 08/20/2013 9.4  8.4 - 10.5 mg/dL Final  . GFR, Est African American 08/20/2013 >89   Final  . GFR, Est Non African American 08/20/2013 87   Final   Comment:                            The estimated GFR is a calculation valid for adults (>=51 years old)                          that uses the CKD-EPI algorithm to adjust for age and sex. It is                            not to be used for children, pregnant women, hospitalized patients,                              patients on dialysis, or with rapidly changing kidney function.                          According to the NKDEP, eGFR >89 is normal, 60-89 shows mild                          impairment, 30-59 shows moderate impairment, 15-29 shows severe                          impairment and <15 is ESRD.                             Marland Kitchen  Cholesterol 08/20/2013 706* 0 - 200 mg/dL Final   Comment: ATP III Classification:                                < 200        mg/dL        Desirable                               200 - 239     mg/dL        Borderline High                               >= 240        mg/dL        High                             . Triglycerides 08/20/2013 3820* <150 mg/dL Final  . HDL 08/20/2013 25* >39 mg/dL Final  . Total CHOL/HDL Ratio 08/20/2013 28.2   Final  . VLDL 08/20/2013 NOT CALC  0 - 40 mg/dL Final   Comment:                            Not calculated due to Triglyceride >400.                          Suggest ordering Direct LDL (Unit Code: [phone]).  . LDL Cholesterol 08/20/2013 NOT CALC  0 - 99 mg/dL Final   Comment:                            Not calculated due to Triglyceride >400.                          Suggest ordering Direct LDL (Unit Code: [phone]).                                                     Total Cholesterol/HDL Ratio:CHD Risk                                                 Coronary Heart Disease Risk Table                                                                 Men       Women                                   1/2 Average Risk  3.4        3.3                                       Average Risk              5.0        4.4                                    2X Average Risk              9.6        7.1                                    3X Average Risk             23.4       11.0                          Use the calculated Patient Ratio above and the CHD Risk table                           to determine the patient's  CHD Risk.                          ATP III Classification (LDL):                                < 100        mg/dL         Optimal                               100 - 129     mg/dL         Near or Above Optimal                               130 - 159     mg/dL         Borderline High                               160 - 189     mg/dL         High                                > 190        mg/dL         Very High                             . Hemoglobin A1C 08/20/2013 9.7* <5.7 % Final   Comment:  According to the ADA Clinical Practice Recommendations for 2011, when                          HbA1c is used as a screening test:                                                       >=6.5%   Diagnostic of Diabetes Mellitus                                     (if abnormal result is confirmed)                                                     5.7-6.4%   Increased risk of developing Diabetes Mellitus                                                     References:Diagnosis and Classification of Diabetes Mellitus,Diabetes                          ALPF,7902,40(XBDZH 1):S62-S69 and Standards of Medical Care in                                  Diabetes - 2011,Diabetes GDJM,4268,34 (Suppl 1):S11-S61.                             . Mean Plasma Glucose 08/20/2013 232* <117 mg/dL Final   labs are significant for a jump in her hemoglobin A1c from 8.4-9.7. Is also significant for total cholesterol greater than 700 and triglyceride level approaching thousand.  She denies any abdominal discomfort or symptoms of pancreatitis. She has never had pancreatitis previously. She does report right flank pain x3 days. She denies any dysuria or hematuria. She denies any pain with range of motion. She denies any injury to her back recently. She does have some mild right-sided CVA tenderness. Past Medical History  Diagnosis Date    . Diabetes mellitus   . Hypertension   . Dyslipidemia   . Varicose veins   . Migraines   . GERD (gastroesophageal reflux disease)   . RSD (reflex sympathetic dystrophy) 2007    R wrist/hand following fx   Past Surgical History  Procedure Laterality Date  . Umbilical hernia repair    . Breast cyst excision    . Tubal ligation    . Cholecystectomy  1989  . Appendectomy  1966  . Abdominal hysterectomy  1988  . Several benign cyst removed      last 1 in 1972  . Cataract extraction  10/2009   Current Outpatient Prescriptions on File Prior to Visit  Medication Sig Dispense Refill  . amLODipine (NORVASC) 5 MG tablet TAKE 1 TABLET BY MOUTH EVERY DAY  30 tablet  3  . calcium carbonate (OS-CAL) 600 MG TABS Take 600 mg by mouth 2 (two) times daily with meals.        . fish oil-omega-3 fatty acids 1000 MG capsule Take 2 g by mouth 2 (two) times daily.        Marland Kitchen FREESTYLE TEST STRIPS test strip       . furosemide (LASIX) 40 MG tablet Take 1 tablet (40 mg total) by mouth daily.  30 tablet  3  . Insulin Aspart (NOVOLOG FLEXPEN [place]) Inject 7 Units into the skin. q supper time      . insulin glargine (LANTUS) 100 UNIT/ML injection Inject 60 Units into the skin daily with breakfast. Pt uses vials      . INSULIN SYRINGE .5CC/29G 29G X 1/2" 0.5 ML MISC AS DIRECTED  100 each  3  . INSULIN SYRINGE 1CC/29G 29G X 1/2" 1 ML MISC USE AS DIRECTED  100 each  0  . losartan (COZAAR) 100 MG tablet TAKE 1 TABLET BY MOUTH EVERY DAY  30 tablet  5  . metoprolol (LOPRESSOR) 50 MG tablet Take 25 mg by mouth 2 (two) times daily. 1/2 of 50 mg tablet BID      . Multiple Vitamin (MULTIVITAMIN) tablet Take 1 tablet by mouth daily.        Marland Kitchen omeprazole (PRILOSEC) 20 MG capsule TAKE 1 CAPSULE BY MOUTH EVERY DAY  30 capsule  3  . potassium chloride SA (K-DUR,KLOR-CON) 20 MEQ tablet TAKE 1 TABLET BY MOUTH TWICE DAILY  180 tablet  0  . Pyridoxine HCl (VITAMIN B-6) 100 MG tablet Take 100 mg by mouth daily.        . B-D  ULTRAFINE III SHORT PEN 31G X 8 MM MISC USE AS DIRECTED  100 each  5   No current facility-administered medications on file prior to visit.   Allergies  Allergen Reactions  . Lipitor [Atorvastatin Calcium] Diarrhea  . Macrobid [Nitrofurantoin Macrocrystal] Other (See Comments)    unknown  . Niacin And Related Hives  . Statins Diarrhea   History   Social History  . Marital Status: Married    Spouse Name: N/A    Number of Children: N/A  . Years of Education: N/A   Occupational History  . Not on file.   Social History Main Topics  . Smoking status: Never Smoker   . Smokeless tobacco: Never Used  . Alcohol Use: No  . Drug Use: No  . Sexual Activity: Not on file   Other Topics Concern  . 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       Objective:   Physical Exam  Vitals reviewed. Constitutional: She is oriented to person, place, and time. She appears well-developed and well-nourished.  Cardiovascular: Normal rate, regular rhythm and normal heart sounds.   No murmur heard. Pulmonary/Chest: Effort normal and breath sounds normal. No respiratory distress. She has no wheezes. She has no rales.  Abdominal: Soft. Bowel sounds are normal. She exhibits no distension. There is no tenderness. There is no rebound.  Musculoskeletal: She exhibits tenderness.  Neurological: She is alert and oriented to person, place, and time. She has normal reflexes. No cranial nerve deficit. She exhibits normal muscle tone. Coordination  normal.          Assessment & Plan:  1. Hypertriglyceridemia Begin the patient on Lopid 600 mg by mouth twice a day and recheck fasting lipid panel in 6 weeks.  I would consider trying the patient on livalo and or zetia but she refuses. - gemfibrozil (LOPID)  600 MG tablet; Take 1 tablet (600 mg total) by mouth 2 (two) times daily before a meal.  Dispense: 60 tablet; Refill: 5  2. Back pain I am concerned the patient may have passed a kidney stone. I will obtain a urinalysis.  Urinalysis positive for leukocyte esterase. There also 3-6 white blood cells per high power field and trace bacteria. I will start the patient on Cipro 500 mg by mouth twice a day for possible urinary tract infection. I also sent a urine culture. If pain persists ordered CT urogram to evaluate for nephrolithiasis - Urinalysis, Routine w reflex microscopic  3. Type II or unspecified type diabetes mellitus without mention of complication, uncontrolled Begin Lantus 60 units subcutaneous daily. Continue NovoLog 7 units with supper. Recheck hemoglobin A1c in 3 months after the patient is compliant

## 2013-08-24 LAB — URINE CULTURE: Colony Count: 3000

## 2013-08-27 ENCOUNTER — Ambulatory Visit: Payer: 59 | Admitting: Family Medicine

## 2013-09-05 ENCOUNTER — Other Ambulatory Visit: Payer: Self-pay | Admitting: Internal Medicine

## 2013-09-14 ENCOUNTER — Other Ambulatory Visit: Payer: Self-pay | Admitting: Internal Medicine

## 2013-10-06 ENCOUNTER — Other Ambulatory Visit: Payer: Self-pay | Admitting: Family Medicine

## 2013-10-23 ENCOUNTER — Other Ambulatory Visit: Payer: Self-pay | Admitting: Family Medicine

## 2013-10-25 ENCOUNTER — Encounter: Payer: Self-pay | Admitting: Family Medicine

## 2013-10-25 ENCOUNTER — Ambulatory Visit (INDEPENDENT_AMBULATORY_CARE_PROVIDER_SITE_OTHER): Payer: 59 | Admitting: Family Medicine

## 2013-10-25 VITALS — BP 120/60 | HR 60 | Temp 97.1°F | Resp 16 | Ht 64.0 in | Wt 157.0 lb

## 2013-10-25 DIAGNOSIS — R350 Frequency of micturition: Secondary | ICD-10-CM

## 2013-10-25 DIAGNOSIS — N390 Urinary tract infection, site not specified: Secondary | ICD-10-CM

## 2013-10-25 LAB — URINALYSIS, MICROSCOPIC ONLY
Casts: NONE SEEN
Crystals: NONE SEEN

## 2013-10-25 LAB — URINALYSIS, ROUTINE W REFLEX MICROSCOPIC
Bilirubin Urine: NEGATIVE
Glucose, UA: NEGATIVE mg/dL
Ketones, ur: NEGATIVE mg/dL
Nitrite: NEGATIVE
Protein, ur: NEGATIVE mg/dL
Specific Gravity, Urine: 1.015 (ref 1.005–1.030)
Urobilinogen, UA: 0.2 mg/dL (ref 0.0–1.0)
pH: 5.5 (ref 5.0–8.0)

## 2013-10-25 MED ORDER — CIPROFLOXACIN HCL 500 MG PO TABS: 500.0000 mg | ORAL_TABLET | Freq: Two times a day (BID) | ORAL | Status: DC

## 2013-10-25 NOTE — Progress Notes (Signed)
Subjective:    Patient ID: Rebecca Perkins, female    DOB: 1929-02-20, 80 y.o.   MRN: [id number]  HPI Patient reports 4 days of urinary tract symptoms. This includes frequency, urgency, hesitancy, and dysuria. She denies any fever or chills. She denies any back pain. She denies any hematuria. Urinalysis today in the office shows blood and leukocyte esterase. Past Medical History  Diagnosis Date  . Diabetes mellitus   . Hypertension   . Dyslipidemia   . Varicose veins   . Migraines   . GERD (gastroesophageal reflux disease)   . RSD (reflex sympathetic dystrophy) 2007    R wrist/hand following fx   Current Outpatient Prescriptions on File Prior to Visit  Medication Sig Dispense Refill  . amLODipine (NORVASC) 5 MG tablet TAKE 1 TABLET BY MOUTH EVERY DAY  30 tablet  1  . B-D ULTRAFINE III SHORT PEN 31G X 8 MM MISC USE AS DIRECTED  100 each  5  . calcium carbonate (OS-CAL) 600 MG TABS Take 600 mg by mouth 2 (two) times daily with meals.        . ciprofloxacin (CIPRO) 500 MG tablet Take 1 tablet (500 mg total) by mouth 2 (two) times daily.  6 tablet  0  . fish oil-omega-3 fatty acids 1000 MG capsule Take 2 g by mouth 2 (two) times daily.        Marland Kitchen FREESTYLE TEST STRIPS test strip       . furosemide (LASIX) 40 MG tablet Take 1 tablet (40 mg total) by mouth daily.  30 tablet  3  . gemfibrozil (LOPID) 600 MG tablet Take 1 tablet (600 mg total) by mouth 2 (two) times daily before a meal.  60 tablet  5  . Insulin Aspart (NOVOLOG FLEXPEN [place]) Inject 7 Units into the skin. q supper time      . insulin glargine (LANTUS) 100 UNIT/ML injection Inject 60 Units into the skin daily with breakfast. Pt uses vials      . INSULIN SYRINGE .5CC/29G 29G X 1/2" 0.5 ML MISC AS DIRECTED  100 each  3  . INSULIN SYRINGE 1CC/29G 29G X 1/2" 1 ML MISC USE AS DIRECTED  100 each  0  . losartan (COZAAR) 100 MG tablet TAKE 1 TABLET BY MOUTH EVERY DAY  30 tablet  5  . metoprolol (LOPRESSOR) 50 MG tablet Take 25 mg by  mouth 2 (two) times daily. 1/2 of 50 mg tablet BID      . Multiple Vitamin (MULTIVITAMIN) tablet Take 1 tablet by mouth daily.        Marland Kitchen omeprazole (PRILOSEC) 20 MG capsule TAKE 1 CAPSULE BY MOUTH DAILY  30 capsule  11  . potassium chloride SA (K-DUR,KLOR-CON) 20 MEQ tablet TAKE 1 TABLET BY MOUTH TWICE DAILY  180 tablet  0  . Pyridoxine HCl (VITAMIN B-6) 100 MG tablet Take 100 mg by mouth daily.         No current facility-administered medications on file prior to visit.   Allergies  Allergen Reactions  . Lipitor [Atorvastatin Calcium] Diarrhea  . Macrobid [Nitrofurantoin Macrocrystal] Other (See Comments)    unknown  . Niacin And Related Hives  . Statins Diarrhea   History   Social History  . Marital Status: Married    Spouse Name: N/A    Number of Children: N/A  . Years of Education: N/A   Occupational History  . Not on file.   Social History Main Topics  . Smoking status:  Never Smoker   . Smokeless tobacco: Never Used  . Alcohol Use: No  . Drug Use: No  . Sexual Activity: Not on file   Other Topics Concern  . 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       Objective:   Physical Exam  Vitals reviewed. Cardiovascular: Normal rate, regular rhythm and normal heart sounds.   No murmur heard. Pulmonary/Chest: Effort normal and breath sounds normal. No respiratory distress. She has no wheezes. She has no rales.  Abdominal: Soft. Bowel sounds are normal. She exhibits no distension. There is no tenderness. There is no rebound and no guarding.   She has no CVA tenderness       Assessment & Plan:  Frequent urination - Plan: Urinalysis, Routine w reflex microscopic  UTI (urinary tract infection)  I will send a urine culture. Meanwhile begin treatment with Cipro 500  mg by mouth twice a day for 7 days.

## 2013-10-25 NOTE — Addendum Note (Signed)
Addended by: Shary Decamp B on: 10/25/2013 11:53 AM   Modules accepted: Orders

## 2013-10-27 LAB — URINE CULTURE: Colony Count: >=100000

## 2013-11-15 ENCOUNTER — Other Ambulatory Visit: Payer: Self-pay | Admitting: Family Medicine

## 2013-11-20 ENCOUNTER — Ambulatory Visit (INDEPENDENT_AMBULATORY_CARE_PROVIDER_SITE_OTHER): Payer: 59 | Admitting: Family Medicine

## 2013-11-20 ENCOUNTER — Encounter: Payer: Self-pay | Admitting: Family Medicine

## 2013-11-20 VITALS — BP 132/68 | HR 60 | Temp 97.1°F | Resp 16 | Ht 64.0 in | Wt 159.0 lb

## 2013-11-20 DIAGNOSIS — R29898 Other symptoms and signs involving the musculoskeletal system: Secondary | ICD-10-CM

## 2013-11-20 DIAGNOSIS — E1165 Type 2 diabetes mellitus with hyperglycemia: Secondary | ICD-10-CM

## 2013-11-20 DIAGNOSIS — IMO0001 Reserved for inherently not codable concepts without codable children: Secondary | ICD-10-CM

## 2013-11-20 DIAGNOSIS — E781 Pure hyperglyceridemia: Secondary | ICD-10-CM

## 2013-11-20 LAB — COMPLETE METABOLIC PANEL WITH GFR
ALT: 15 U/L (ref 0–35)
AST: 17 U/L (ref 0–37)
Albumin: 4.1 g/dL (ref 3.5–5.2)
Alkaline Phosphatase: 72 U/L (ref 39–117)
BUN: 23 mg/dL (ref 6–23)
CO2: 25 mEq/L (ref 19–32)
Calcium: 9.2 mg/dL (ref 8.4–10.5)
Chloride: 105 mEq/L (ref 96–112)
Creat: 1.02 mg/dL (ref 0.50–1.10)
GFR, Est African American: 59 mL/min — ABNORMAL LOW
GFR, Est Non African American: 51 mL/min — ABNORMAL LOW
Glucose, Bld: 94 mg/dL (ref 70–99)
Potassium: 3.9 mEq/L (ref 3.5–5.3)
Sodium: 141 mEq/L (ref 135–145)
Total Bilirubin: 0.4 mg/dL (ref 0.2–1.2)
Total Protein: 7.1 g/dL (ref 6.0–8.3)

## 2013-11-20 LAB — LIPID PANEL
Cholesterol: 236 mg/dL — ABNORMAL HIGH (ref 0–200)
HDL: 31 mg/dL — ABNORMAL LOW (ref >39)
Total CHOL/HDL Ratio: 7.6 Ratio
Triglycerides: 825 mg/dL — ABNORMAL HIGH (ref <150)

## 2013-11-20 LAB — HEMOGLOBIN A1C
Hgb A1c MFr Bld: 8.4 % — ABNORMAL HIGH (ref <5.7)
Mean Plasma Glucose: 194 mg/dL — ABNORMAL HIGH (ref <117)

## 2013-11-20 NOTE — Progress Notes (Signed)
Subjective:    Patient ID: Vanessa Barbara, female    DOB: 19-Nov-1928, 80 y.o.   MRN: [id number]  HPI Patient has a history of severe hypertriglyceridemia. She is failed all statins. She is failed fenofibrate. At her last office visit I started the patient on gemfibrozil 600 mg by mouth twice a day. She seems to be tolerating this better. She does continue to complain of weakness in both legs. She has palpable pulses in both feet both dorsalis pedis and posterior tibialis. She does report some low back pain. She denies any numbness or tingling radiating down her legs. She primarily complains of muscle weakness in her calves and in her thighs.  Her fasting blood sugars typically range 80-142. She does have occasional values of 170-200 but this is extremely rare the majority are in the normal range of 80-142. Her blood pressures are excellent at 122-139/53-68. Past Medical History  Diagnosis Date  . Diabetes mellitus   . Hypertension   . Dyslipidemia   . Varicose veins   . Migraines   . GERD (gastroesophageal reflux disease)   . RSD (reflex sympathetic dystrophy) 2007    R wrist/hand following fx   Current Outpatient Prescriptions on File Prior to Visit  Medication Sig Dispense Refill  . amLODipine (NORVASC) 5 MG tablet TAKE 1 TABLET BY MOUTH EVERY DAY  30 tablet  1  . B-D ULTRAFINE III SHORT PEN 31G X 8 MM MISC USE AS DIRECTED  100 each  5  . calcium carbonate (OS-CAL) 600 MG TABS Take 600 mg by mouth 2 (two) times daily with meals.        . fish oil-omega-3 fatty acids 1000 MG capsule Take 2 g by mouth 2 (two) times daily.        Marland Kitchen FREESTYLE TEST STRIPS test strip       . furosemide (LASIX) 40 MG tablet Take 1 tablet (40 mg total) by mouth daily.  30 tablet  3  . gemfibrozil (LOPID) 600 MG tablet Take 1 tablet (600 mg total) by mouth 2 (two) times daily before a meal.  60 tablet  5  . Insulin Aspart (NOVOLOG FLEXPEN Mona) Inject 7 Units into the skin. q supper time      . insulin  glargine (LANTUS) 100 UNIT/ML injection Inject 60 Units into the skin daily with breakfast. Pt uses vials      . INSULIN SYRINGE 1CC/29G 29G X 1/2" 1 ML MISC USE AS DIRECTED  100 each  6  . losartan (COZAAR) 100 MG tablet TAKE 1 TABLET BY MOUTH EVERY DAY  30 tablet  5  . metoprolol (LOPRESSOR) 50 MG tablet 1/2 of 50 mg tablet BID      . Multiple Vitamin (MULTIVITAMIN) tablet Take 1 tablet by mouth daily.        Marland Kitchen omeprazole (PRILOSEC) 20 MG capsule TAKE 1 CAPSULE BY MOUTH DAILY  30 capsule  11  . potassium chloride SA (K-DUR,KLOR-CON) 20 MEQ tablet TAKE 1 TABLET BY MOUTH TWICE DAILY  180 tablet  0  . Pyridoxine HCl (VITAMIN B-6) 100 MG tablet Take 100 mg by mouth daily.        . INSULIN SYRINGE .5CC/29G 29G X 1/2" 0.5 ML MISC AS DIRECTED  100 each  3   No current facility-administered medications on file prior to visit.   Allergies  Allergen Reactions  . Lipitor [Atorvastatin Calcium] Diarrhea  . Macrobid [Nitrofurantoin Macrocrystal] Other (See Comments)    unknown  . Niacin  And Related Hives  . Statins Diarrhea   History   Social History  . Marital Status: Married    Spouse Name: N/A    Number of Children: N/A  . Years of Education: N/A   Occupational History  . Not on file.   Social History Main Topics  . Smoking status: Never Smoker   . Smokeless tobacco: Never Used  . Alcohol Use: No  . Drug Use: No  . Sexual Activity: Not on file   Other Topics Concern  . 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      Objective:   Physical Exam  Vitals reviewed. Constitutional: She is oriented to person, place, and time.  Cardiovascular: Normal rate, regular rhythm, normal heart sounds and intact distal pulses.   No murmur heard. Pulmonary/Chest: Effort normal and breath  sounds normal. No respiratory distress. She has no wheezes. She has no rales.  Abdominal: Soft. Bowel sounds are normal. She exhibits no distension.  Neurological: She is alert and oriented to person, place, and time. She has normal reflexes. She displays normal reflexes. No cranial nerve deficit. She exhibits normal muscle tone.          Assessment & Plan:  1. Hypertriglyceridemia Check fasting lipid panel. I was satisfied and get her triglycerides under 500. I do believe her muscle weakness is due to her cholesterol medication. However given the severity of her triglyceride levels I think he stay on this. Therefore consultation therapy to try to improve her strength in her legs. If this is not improving I would check arterial Dopplers to rule out peripheral vascular disease and even an MRI of the lumbar spine to rule out spinal stenosis - COMPLETE METABOLIC PANEL WITH GFR - Lipid panel  2. Type II or unspecified type diabetes mellitus without mention of complication, uncontrolled Sugar seem adequately controlled. We'll check a hemoglobin A1c. As long as her hemoglobin A1c is less than 8 I am satisfied given her age. - Hemoglobin A1c

## 2013-11-28 ENCOUNTER — Ambulatory Visit: Payer: Medicare Other | Attending: Family Medicine | Admitting: Physical Therapy

## 2013-11-28 DIAGNOSIS — R269 Unspecified abnormalities of gait and mobility: Secondary | ICD-10-CM | POA: Insufficient documentation

## 2013-11-28 DIAGNOSIS — M6281 Muscle weakness (generalized): Secondary | ICD-10-CM | POA: Insufficient documentation

## 2013-11-28 DIAGNOSIS — IMO0001 Reserved for inherently not codable concepts without codable children: Secondary | ICD-10-CM | POA: Insufficient documentation

## 2013-12-03 ENCOUNTER — Ambulatory Visit: Payer: Medicare Other | Admitting: Physical Therapy

## 2013-12-04 ENCOUNTER — Other Ambulatory Visit: Payer: Self-pay | Admitting: Family Medicine

## 2013-12-04 MED ORDER — OMEGA-3-ACID ETHYL ESTERS 1 G PO CAPS: 2.0000 g | ORAL_CAPSULE | Freq: Every day | ORAL | Status: DC

## 2013-12-05 ENCOUNTER — Ambulatory Visit: Payer: Medicare Other | Attending: Family Medicine | Admitting: Physical Therapy

## 2013-12-05 DIAGNOSIS — R269 Unspecified abnormalities of gait and mobility: Secondary | ICD-10-CM | POA: Diagnosis not present

## 2013-12-05 DIAGNOSIS — M6281 Muscle weakness (generalized): Secondary | ICD-10-CM | POA: Insufficient documentation

## 2013-12-05 DIAGNOSIS — IMO0001 Reserved for inherently not codable concepts without codable children: Secondary | ICD-10-CM | POA: Insufficient documentation

## 2013-12-06 ENCOUNTER — Other Ambulatory Visit: Payer: Self-pay | Admitting: *Deleted

## 2013-12-06 MED ORDER — INSULIN ASPART 100 UNIT/ML ~~LOC~~ SOLN: SUBCUTANEOUS | Status: DC

## 2013-12-07 ENCOUNTER — Ambulatory Visit: Payer: Medicare Other | Admitting: Physical Therapy

## 2013-12-07 DIAGNOSIS — IMO0001 Reserved for inherently not codable concepts without codable children: Secondary | ICD-10-CM | POA: Diagnosis not present

## 2013-12-09 ENCOUNTER — Encounter (HOSPITAL_COMMUNITY): Payer: Self-pay | Admitting: Emergency Medicine

## 2013-12-09 ENCOUNTER — Emergency Department (HOSPITAL_COMMUNITY): Payer: Medicare Other

## 2013-12-09 ENCOUNTER — Inpatient Hospital Stay (HOSPITAL_COMMUNITY)
Admission: EM | Admit: 2013-12-09 | Discharge: 2013-12-13 | DRG: 871 | Disposition: A | Discharge status: Home / Self Care | Payer: Medicare Other | Attending: Internal Medicine | Admitting: Internal Medicine

## 2013-12-09 DIAGNOSIS — I10 Essential (primary) hypertension: Secondary | ICD-10-CM | POA: Diagnosis present

## 2013-12-09 DIAGNOSIS — IMO0001 Reserved for inherently not codable concepts without codable children: Secondary | ICD-10-CM | POA: Diagnosis present

## 2013-12-09 DIAGNOSIS — J189 Pneumonia, unspecified organism: Secondary | ICD-10-CM | POA: Diagnosis present

## 2013-12-09 DIAGNOSIS — E785 Hyperlipidemia, unspecified: Secondary | ICD-10-CM

## 2013-12-09 DIAGNOSIS — Z794 Long term (current) use of insulin: Secondary | ICD-10-CM

## 2013-12-09 DIAGNOSIS — A419 Sepsis, unspecified organism: Principal | ICD-10-CM | POA: Diagnosis present

## 2013-12-09 DIAGNOSIS — J9600 Acute respiratory failure, unspecified whether with hypoxia or hypercapnia: Secondary | ICD-10-CM | POA: Diagnosis present

## 2013-12-09 DIAGNOSIS — IMO0002 Reserved for concepts with insufficient information to code with codable children: Secondary | ICD-10-CM

## 2013-12-09 DIAGNOSIS — E119 Type 2 diabetes mellitus without complications: Secondary | ICD-10-CM

## 2013-12-09 DIAGNOSIS — D649 Anemia, unspecified: Secondary | ICD-10-CM | POA: Diagnosis present

## 2013-12-09 DIAGNOSIS — K219 Gastro-esophageal reflux disease without esophagitis: Secondary | ICD-10-CM | POA: Diagnosis present

## 2013-12-09 DIAGNOSIS — E781 Pure hyperglyceridemia: Secondary | ICD-10-CM | POA: Diagnosis present

## 2013-12-09 DIAGNOSIS — Z7982 Long term (current) use of aspirin: Secondary | ICD-10-CM

## 2013-12-09 DIAGNOSIS — R739 Hyperglycemia, unspecified: Secondary | ICD-10-CM

## 2013-12-09 DIAGNOSIS — E1165 Type 2 diabetes mellitus with hyperglycemia: Secondary | ICD-10-CM

## 2013-12-09 LAB — CBG MONITORING, ED: Glucose-Capillary: 292 mg/dL — ABNORMAL HIGH (ref 70–99)

## 2013-12-09 LAB — COMPREHENSIVE METABOLIC PANEL
ALT: 12 U/L (ref 0–35)
AST: 28 U/L (ref 0–37)
Albumin: 3.1 g/dL — ABNORMAL LOW (ref 3.5–5.2)
Alkaline Phosphatase: 85 U/L (ref 39–117)
BUN: 15 mg/dL (ref 6–23)
CO2: 19 mEq/L (ref 19–32)
Calcium: 9.5 mg/dL (ref 8.4–10.5)
Chloride: 97 mEq/L (ref 96–112)
Creatinine, Ser: 1.06 mg/dL (ref 0.50–1.10)
GFR calc Af Amer: 55 mL/min — ABNORMAL LOW (ref >90)
GFR calc non Af Amer: 48 mL/min — ABNORMAL LOW (ref >90)
Glucose, Bld: 292 mg/dL — ABNORMAL HIGH (ref 70–99)
Potassium: 3.5 mEq/L — ABNORMAL LOW (ref 3.7–5.3)
Sodium: 136 mEq/L — ABNORMAL LOW (ref 137–147)
Total Bilirubin: 0.7 mg/dL (ref 0.3–1.2)
Total Protein: 7.5 g/dL (ref 6.0–8.3)

## 2013-12-09 LAB — CBC WITH DIFFERENTIAL/PLATELET
Basophils Absolute: 0 10*3/uL (ref 0.0–0.1)
Basophils Relative: 0 % (ref 0–1)
Eosinophils Absolute: 0 10*3/uL (ref 0.0–0.7)
Eosinophils Relative: 0 % (ref 0–5)
HCT: 33.7 % — ABNORMAL LOW (ref 36.0–46.0)
Hemoglobin: 11 g/dL — ABNORMAL LOW (ref 12.0–15.0)
Lymphocytes Relative: 8 % — ABNORMAL LOW (ref 12–46)
Lymphs Abs: 1.1 10*3/uL (ref 0.7–4.0)
MCH: 27.4 pg (ref 26.0–34.0)
MCHC: 32.6 g/dL (ref 30.0–36.0)
MCV: 83.8 fL (ref 78.0–100.0)
Monocytes Absolute: 0.6 10*3/uL (ref 0.1–1.0)
Monocytes Relative: 5 % (ref 3–12)
Neutro Abs: 11.2 10*3/uL — ABNORMAL HIGH (ref 1.7–7.7)
Neutrophils Relative %: 87 % — ABNORMAL HIGH (ref 43–77)
Platelets: 185 10*3/uL (ref 150–400)
RBC: 4.02 MIL/uL (ref 3.87–5.11)
RDW: 12.6 % (ref 11.5–15.5)
WBC: 12.9 10*3/uL — ABNORMAL HIGH (ref 4.0–10.5)

## 2013-12-09 MED ORDER — METOCLOPRAMIDE HCL 5 MG/ML IJ SOLN
10.0000 mg | Freq: Once | INTRAMUSCULAR | Status: AC
  Administered 2013-12-09: 10 mg via INTRAVENOUS
  Filled 2013-12-09: qty 2

## 2013-12-09 MED ORDER — DEXTROSE 5 % IV SOLN
1.0000 g | Freq: Once | INTRAVENOUS | Status: AC
  Administered 2013-12-09: 1 g via INTRAVENOUS
  Filled 2013-12-09: qty 10

## 2013-12-09 MED ORDER — DIPHENHYDRAMINE HCL 50 MG/ML IJ SOLN
25.0000 mg | Freq: Once | INTRAMUSCULAR | Status: AC
  Administered 2013-12-09: 25 mg via INTRAVENOUS
  Filled 2013-12-09: qty 1

## 2013-12-09 MED ORDER — POTASSIUM CHLORIDE CRYS ER 20 MEQ PO TBCR
20.0000 meq | EXTENDED_RELEASE_TABLET | Freq: Once | ORAL | Status: AC
  Administered 2013-12-09: 20 meq via ORAL
  Filled 2013-12-09: qty 1

## 2013-12-09 MED ORDER — POTASSIUM CHLORIDE 10 MEQ/100ML IV SOLN
10.0000 meq | Freq: Once | INTRAVENOUS | Status: DC
Start: 2013-12-09 — End: 2013-12-09

## 2013-12-09 MED ORDER — SODIUM CHLORIDE 0.9 % IV BOLUS (SEPSIS)
1000.0000 mL | Freq: Once | INTRAVENOUS | Status: AC
  Administered 2013-12-09: 1000 mL via INTRAVENOUS

## 2013-12-09 MED ORDER — DEXTROSE 5 % IV SOLN
500.0000 mg | Freq: Once | INTRAVENOUS | Status: AC
  Administered 2013-12-09: 500 mg via INTRAVENOUS

## 2013-12-09 NOTE — ED Notes (Signed)
Patient states unable to void at this time.

## 2013-12-09 NOTE — ED Notes (Signed)
Chris Chirsco RN notified of CBG result [id number]

## 2013-12-09 NOTE — ED Notes (Signed)
Dr Sabra Heck At bedside

## 2013-12-09 NOTE — ED Provider Notes (Signed)
CSN: [id number]     Arrival date & time 12/09/13  1920 History   First MD Initiated Contact with Patient 12/09/13 1949     Chief Complaint  Patient presents with  . Hyperglycemia     (Consider location/radiation/quality/duration/timing/severity/associated sxs/prior Treatment) Patient is a 80 y.o. female presenting with general illness. The history is provided by the patient.  Illness Location:  Generalized Quality:  Fatigue and malaise Severity:  Moderate Onset quality:  Gradual Duration:  4 days Timing:  Constant Progression:  Worsening Chronicity:  New Context:  At rest Relieved by:  Nothing Worsened by:  Nothing Associated symptoms: cough (dry x 2 days), fatigue and fever (resolved s/p ibuprofen)   Associated symptoms: no rash, no shortness of breath, no vomiting and no wheezing     Past Medical History  Diagnosis Date  . Diabetes mellitus   . Hypertension   . Dyslipidemia   . Varicose veins   . Migraines   . GERD (gastroesophageal reflux disease)   . RSD (reflex sympathetic dystrophy) 2007    R wrist/hand following fx   Past Surgical History  Procedure Laterality Date  . Umbilical hernia repair    . Breast cyst excision    . Tubal ligation    . Cholecystectomy  1989  . Appendectomy  1966  . Abdominal hysterectomy  1988  . Several benign cyst removed      last 1 in 1972  . Cataract extraction  10/2009   Family History  Problem Relation Age of Onset  . Stroke Mother 19  . Hypertension Mother   . Clotting disorder Father   . Heart attack Father   . Arrhythmia Sister   . Stroke Brother    History  Substance Use Topics  . Smoking status: Never Smoker   . Smokeless tobacco: Never Used  . Alcohol Use: No   OB History   Grav Para Term Preterm Abortions TAB SAB Ect Mult Living                 Review of Systems  Constitutional: Positive for fever (resolved s/p ibuprofen) and fatigue.  Respiratory: Positive for cough (dry x 2 days). Negative for  shortness of breath and wheezing.   Gastrointestinal: Negative for vomiting.  Skin: Negative for rash.  All other systems reviewed and are negative.     Allergies  Lipitor; Niacin and related; Statins; and Macrobid  Home Medications   Prior to Admission medications   Medication Sig Start Date End Date Taking? Authorizing Provider  amLODipine (NORVASC) 5 MG tablet Take 5 mg by mouth daily.   Yes Historical Provider, MD  aspirin EC 81 MG tablet Take 81 mg by mouth daily.   Yes Historical Provider, MD  Calcium Carbonate-Vitamin D (CALTRATE 600+D PO) Take 1 tablet by mouth 2 (two) times daily.   Yes Historical Provider, MD  fish oil-omega-3 fatty acids 1000 MG capsule Take 2 g by mouth 2 (two) times daily.     Yes Historical Provider, MD  furosemide (LASIX) 40 MG tablet Take 1 tablet (40 mg total) by mouth daily. 08/13/13  Yes [name], MD  gemfibrozil (LOPID) 600 MG tablet Take 1 tablet (600 mg total) by mouth 2 (two) times daily before a meal. 08/23/13  Yes [name], MD  ibuprofen (ADVIL,MOTRIN) 200 MG tablet Take 400 mg by mouth every 6 (six) hours as needed for fever.   Yes Historical Provider, MD  insulin aspart (NOVOLOG) 100 UNIT/ML injection Inject 5U SQ  with breakfast, 7U SQ with lunch and 7U SQ with dinner. 12/06/13  Yes [name], MD  insulin glargine (LANTUS) 100 UNIT/ML injection Inject 60 Units into the skin daily with breakfast. Pt uses vials   Yes Historical Provider, MD  losartan (COZAAR) 100 MG tablet Take 100 mg by mouth daily.   Yes Historical Provider, MD  metoprolol (LOPRESSOR) 50 MG tablet Take 25 mg by mouth 2 (two) times daily.   Yes Historical Provider, MD  Multiple Vitamin (MULTIVITAMIN) tablet Take 1 tablet by mouth daily.     Yes Historical Provider, MD  omeprazole (PRILOSEC) 20 MG capsule Take 20 mg by mouth daily.   Yes Historical Provider, MD  potassium chloride SA (K-DUR,KLOR-CON) 20 MEQ tablet Take 20 mEq by mouth 2 (two) times daily.   Yes  Historical Provider, MD  Pyridoxine HCl (VITAMIN B-6) 100 MG tablet Take 100 mg by mouth daily.     Yes Historical Provider, MD   BP 136/60  Pulse 119  Temp(Src) 98.7 F (37.1 C) (Oral)  Resp 30  Ht 5\' 4"  (1.626 m)  Wt 156 lb (70.761 kg)  BMI 26.76 kg/m2  SpO2 91% Physical Exam  Constitutional: She is oriented to person, place, and time. She appears well-developed and well-nourished. No distress.  HENT:  Head: Normocephalic.  Eyes: Conjunctivae are normal.  Neck: Neck supple. No tracheal deviation present.  Cardiovascular: Regular rhythm.  Tachycardia present.   No murmur heard. Pulmonary/Chest: Tachypnea noted. No respiratory distress. She has no wheezes. She exhibits no tenderness.  Abdominal: Soft. She exhibits no distension.  Neurological: She is alert and oriented to person, place, and time.  Skin: Skin is warm and dry.  Psychiatric: She has a normal mood and affect.    ED Course  CRITICAL CARE Performed by: Leo Grosser Authorized by: Leo Grosser Total critical care time: 30 minutes Critical care time was exclusive of separately billable procedures and treating other patients. Critical care was necessary to treat or prevent imminent or life-threatening deterioration of the following conditions: dehydration and sepsis. Critical care was time spent personally by me on the following activities: development of treatment plan with patient or surrogate, discussions with consultants, interpretation of cardiac output measurements, evaluation of patient's response to treatment, examination of patient, obtaining history from patient or surrogate, ordering and review of laboratory studies, ordering and review of radiographic studies and re-evaluation of patient's condition.   (including critical care time) Labs Review Labs Reviewed  CBC WITH DIFFERENTIAL - Abnormal; Notable for the following:    WBC 12.9 (*)    Hemoglobin 11.0 (*)    HCT 33.7 (*)    Neutrophils Relative % 87  (*)    Neutro Abs 11.2 (*)    Lymphocytes Relative 8 (*)    All other components within normal limits  CBG MONITORING, ED - Abnormal; Notable for the following:    Glucose-Capillary 292 (*)    All other components within normal limits  URINE CULTURE  URINALYSIS, ROUTINE W REFLEX MICROSCOPIC  COMPREHENSIVE METABOLIC PANEL    Imaging Review Dg Chest 2 View  12/09/2013   CLINICAL DATA:  Shortness of breath and fever  EXAM: CHEST  2 VIEW  COMPARISON:  None.  FINDINGS: Somewhat rounded patchy infiltrate is noted in the left mid lung anteriorly projecting in left upper lobe consistent with an acute pneumonia. No other focal infiltrate is seen. The cardiac shadow is within normal limits. No acute bony abnormality is noted. Compression deformity is noted in  the lower thoracic spine likely of a chronic nature.  IMPRESSION: Rounded infiltrate in the left mid lung consistent with acute pneumonia. Followup films to resolution are recommended.  Thoracic compression deformity which appears chronic in nature.   Electronically Signed   By: Inez Catalina M.D.   On: 12/09/2013 21:33     EKG Interpretation None      MDM   Final diagnoses:  Sepsis  CAP (community acquired pneumonia)  Hyperglycemia  Type 2 diabetes mellitus   80 y.o. female presents with generalized weakness after having an elevated blood sugar near 300 over the last 4 days. She states that her normal blood sugar runs in the mid 200s typically said this is only a moderate elevation from prior. She had fever prior to arrival with her family who provided her ibuprofen for her symptoms, her fever improved on arrival she has no elevation in her temperature. She is tachycardic here and her blood sugar level does not convey a state of DKA or other concerning metabolic status. She appears moderately dehydrated. On review of systems she also has had a mild dry cough over the last few days. This accompanied with fever triggered a chest x-ray which  demonstrated a focal pneumonia. The patient has to Sirs criteria with tachycardia and elevation of her white blood cell count and an identified source which qualifies her designation as sepsis and she required admission to the hospital with IV antibiotics to treat community acquired pneumonia. She was hemodynamically stable throughout her emergency department course an internal medicine service was consulted for admission.    Leo Grosser, MD 12/09/13 [phone]

## 2013-12-09 NOTE — ED Notes (Signed)
The pt has had urinary  Frequency. Temp of 103 earlier today she took advil

## 2013-12-09 NOTE — ED Notes (Signed)
Dr Miller at Bedside.

## 2013-12-09 NOTE — ED Notes (Signed)
Dr Laneta Simmers at bedside with pt

## 2013-12-09 NOTE — ED Notes (Signed)
Headache for 3-4  Days also

## 2013-12-09 NOTE — ED Notes (Addendum)
Pt states she took some motrin around 1730 tonight for her fever which was 103.4 at home.  Pt states it has also helped with her headache some.  Pt states she has been drinking more than normal and urinating a lot, but has not been thirsty or eating much today.

## 2013-12-09 NOTE — ED Notes (Signed)
The pt is c/o a high blood sugar  For 3-4 days.  Insulin dependent diabetic.  She  Feels weak  With a temp and no energy

## 2013-12-10 ENCOUNTER — Encounter (HOSPITAL_COMMUNITY): Payer: Self-pay | Admitting: Internal Medicine

## 2013-12-10 DIAGNOSIS — J189 Pneumonia, unspecified organism: Secondary | ICD-10-CM | POA: Diagnosis present

## 2013-12-10 DIAGNOSIS — R609 Edema, unspecified: Secondary | ICD-10-CM

## 2013-12-10 DIAGNOSIS — IMO0002 Reserved for concepts with insufficient information to code with codable children: Secondary | ICD-10-CM | POA: Diagnosis present

## 2013-12-10 DIAGNOSIS — A419 Sepsis, unspecified organism: Principal | ICD-10-CM

## 2013-12-10 DIAGNOSIS — E1165 Type 2 diabetes mellitus with hyperglycemia: Secondary | ICD-10-CM | POA: Diagnosis present

## 2013-12-10 DIAGNOSIS — E785 Hyperlipidemia, unspecified: Secondary | ICD-10-CM

## 2013-12-10 LAB — URINE MICROSCOPIC-ADD ON

## 2013-12-10 LAB — URINALYSIS, ROUTINE W REFLEX MICROSCOPIC
Bilirubin Urine: NEGATIVE
Glucose, UA: 500 mg/dL — AB
Ketones, ur: NEGATIVE mg/dL
Nitrite: NEGATIVE
Protein, ur: 30 mg/dL — AB
Specific Gravity, Urine: 1.017 (ref 1.005–1.030)
Urobilinogen, UA: 1 mg/dL (ref 0.0–1.0)
pH: 5 (ref 5.0–8.0)

## 2013-12-10 LAB — LEGIONELLA ANTIGEN, URINE: Legionella Antigen, Urine: NEGATIVE

## 2013-12-10 LAB — GLUCOSE, CAPILLARY
Glucose-Capillary: 267 mg/dL — ABNORMAL HIGH (ref 70–99)
Glucose-Capillary: 171 mg/dL — ABNORMAL HIGH (ref 70–99)
Glucose-Capillary: 159 mg/dL — ABNORMAL HIGH (ref 70–99)
Glucose-Capillary: 126 mg/dL — ABNORMAL HIGH (ref 70–99)
Glucose-Capillary: 105 mg/dL — ABNORMAL HIGH (ref 70–99)

## 2013-12-10 LAB — VITAMIN B12: Vitamin B-12: 1072 pg/mL — ABNORMAL HIGH (ref 211–911)

## 2013-12-10 LAB — FERRITIN: Ferritin: 902 ng/mL — ABNORMAL HIGH (ref 10–291)

## 2013-12-10 LAB — STREP PNEUMONIAE URINARY ANTIGEN: Strep Pneumo Urinary Antigen: NEGATIVE

## 2013-12-10 LAB — URINE CULTURE
Colony Count: NO GROWTH
Culture: NO GROWTH

## 2013-12-10 LAB — CBC
HCT: 30.8 % — ABNORMAL LOW (ref 36.0–46.0)
Hemoglobin: 10.1 g/dL — ABNORMAL LOW (ref 12.0–15.0)
MCH: 27.6 pg (ref 26.0–34.0)
MCHC: 32.8 g/dL (ref 30.0–36.0)
MCV: 84.2 fL (ref 78.0–100.0)
Platelets: 172 10*3/uL (ref 150–400)
RBC: 3.66 MIL/uL — ABNORMAL LOW (ref 3.87–5.11)
RDW: 12.5 % (ref 11.5–15.5)
WBC: 10.8 10*3/uL — ABNORMAL HIGH (ref 4.0–10.5)

## 2013-12-10 LAB — CREATININE, SERUM
Creatinine, Ser: 0.92 mg/dL (ref 0.50–1.10)
GFR calc Af Amer: 65 mL/min — ABNORMAL LOW (ref >90)
GFR calc non Af Amer: 56 mL/min — ABNORMAL LOW (ref >90)

## 2013-12-10 LAB — RETICULOCYTES
RBC.: 3.5 MIL/uL — ABNORMAL LOW (ref 3.87–5.11)
Retic Count, Absolute: 38.5 10*3/uL (ref 19.0–186.0)
Retic Ct Pct: 1.1 % (ref 0.4–3.1)

## 2013-12-10 LAB — CK: Total CK: 495 U/L — ABNORMAL HIGH (ref 7–177)

## 2013-12-10 LAB — HEMOGLOBIN A1C
Hgb A1c MFr Bld: 8.6 % — ABNORMAL HIGH (ref <5.7)
Mean Plasma Glucose: 200 mg/dL — ABNORMAL HIGH (ref <117)

## 2013-12-10 LAB — TROPONIN I: Troponin I: <0.3 ng/mL (ref <0.30)

## 2013-12-10 MED ORDER — ENOXAPARIN SODIUM 40 MG/0.4ML ~~LOC~~ SOLN
40.0000 mg | SUBCUTANEOUS | Status: DC
  Administered 2013-12-10 – 2013-12-13 (×4): 40 mg via SUBCUTANEOUS
  Filled 2013-12-10 (×4): qty 0.4

## 2013-12-10 MED ORDER — INSULIN ASPART 100 UNIT/ML ~~LOC~~ SOLN
5.0000 [IU] | Freq: Three times a day (TID) | SUBCUTANEOUS | Status: DC
  Administered 2013-12-10 – 2013-12-13 (×9): 5 [IU] via SUBCUTANEOUS

## 2013-12-10 MED ORDER — GEMFIBROZIL 600 MG PO TABS
600.0000 mg | ORAL_TABLET | Freq: Two times a day (BID) | ORAL | Status: DC
  Administered 2013-12-10 – 2013-12-13 (×7): 600 mg via ORAL
  Filled 2013-12-10 (×9): qty 1

## 2013-12-10 MED ORDER — OMEGA-3-ACID ETHYL ESTERS 1 G PO CAPS
1.0000 g | ORAL_CAPSULE | Freq: Every day | ORAL | Status: DC
  Administered 2013-12-10 – 2013-12-13 (×4): 1 g via ORAL
  Filled 2013-12-10 (×4): qty 1

## 2013-12-10 MED ORDER — METOPROLOL TARTRATE 25 MG PO TABS
25.0000 mg | ORAL_TABLET | Freq: Two times a day (BID) | ORAL | Status: DC
  Administered 2013-12-10 – 2013-12-13 (×8): 25 mg via ORAL
  Filled 2013-12-10 (×9): qty 1

## 2013-12-10 MED ORDER — SODIUM CHLORIDE 0.9 % IV SOLN
INTRAVENOUS | Status: AC
  Administered 2013-12-10 (×2): via INTRAVENOUS

## 2013-12-10 MED ORDER — PANTOPRAZOLE SODIUM 40 MG PO TBEC
40.0000 mg | DELAYED_RELEASE_TABLET | Freq: Every day | ORAL | Status: DC
  Administered 2013-12-10 – 2013-12-13 (×4): 40 mg via ORAL
  Filled 2013-12-10 (×3): qty 1

## 2013-12-10 MED ORDER — ACETAMINOPHEN 325 MG PO TABS
650.0000 mg | ORAL_TABLET | Freq: Four times a day (QID) | ORAL | Status: DC | PRN
  Administered 2013-12-10 – 2013-12-11 (×3): 650 mg via ORAL
  Filled 2013-12-10 (×3): qty 2

## 2013-12-10 MED ORDER — ADULT MULTIVITAMIN W/MINERALS CH
1.0000 | ORAL_TABLET | Freq: Every day | ORAL | Status: DC
  Administered 2013-12-10 – 2013-12-13 (×4): 1 via ORAL
  Filled 2013-12-10 (×4): qty 1

## 2013-12-10 MED ORDER — ONDANSETRON HCL 4 MG PO TABS: 4.0000 mg | ORAL_TABLET | Freq: Four times a day (QID) | ORAL | Status: DC | PRN

## 2013-12-10 MED ORDER — ACETAMINOPHEN 650 MG RE SUPP: 650.0000 mg | Freq: Four times a day (QID) | RECTAL | Status: DC | PRN

## 2013-12-10 MED ORDER — OMEGA-3 FATTY ACIDS 1000 MG PO CAPS: 2.0000 g | ORAL_CAPSULE | Freq: Two times a day (BID) | ORAL | Status: DC

## 2013-12-10 MED ORDER — ASPIRIN EC 81 MG PO TBEC
81.0000 mg | DELAYED_RELEASE_TABLET | Freq: Every day | ORAL | Status: DC
  Administered 2013-12-10 – 2013-12-13 (×4): 81 mg via ORAL
  Filled 2013-12-10 (×4): qty 1

## 2013-12-10 MED ORDER — SODIUM CHLORIDE 0.9 % IJ SOLN
3.0000 mL | Freq: Two times a day (BID) | INTRAMUSCULAR | Status: DC
  Administered 2013-12-10 – 2013-12-12 (×6): 3 mL via INTRAVENOUS

## 2013-12-10 MED ORDER — ONDANSETRON HCL 4 MG/2ML IJ SOLN: 4.0000 mg | Freq: Four times a day (QID) | INTRAMUSCULAR | Status: DC | PRN

## 2013-12-10 MED ORDER — DEXTROSE 5 % IV SOLN
1.0000 g | INTRAVENOUS | Status: DC
  Administered 2013-12-10 – 2013-12-11 (×2): 1 g via INTRAVENOUS
  Filled 2013-12-10 (×3): qty 10

## 2013-12-10 MED ORDER — INSULIN GLARGINE 100 UNIT/ML ~~LOC~~ SOLN
60.0000 [IU] | Freq: Every day | SUBCUTANEOUS | Status: DC
  Administered 2013-12-10 – 2013-12-13 (×4): 60 [IU] via SUBCUTANEOUS
  Filled 2013-12-10 (×6): qty 0.6

## 2013-12-10 MED ORDER — VITAMIN B-6 100 MG PO TABS
100.0000 mg | ORAL_TABLET | Freq: Every day | ORAL | Status: DC
Start: 2013-12-10 — End: 2013-12-13
  Administered 2013-12-10 – 2013-12-13 (×4): 100 mg via ORAL
  Filled 2013-12-10 (×4): qty 1

## 2013-12-10 MED ORDER — INSULIN ASPART 100 UNIT/ML ~~LOC~~ SOLN: 0.0000 [IU] | Freq: Three times a day (TID) | SUBCUTANEOUS | Status: DC

## 2013-12-10 MED ORDER — AZITHROMYCIN 500 MG PO TABS
500.0000 mg | ORAL_TABLET | Freq: Every day | ORAL | Status: DC
  Administered 2013-12-10 – 2013-12-11 (×2): 500 mg via ORAL
  Filled 2013-12-10 (×3): qty 1

## 2013-12-10 MED ORDER — AMLODIPINE BESYLATE 5 MG PO TABS
5.0000 mg | ORAL_TABLET | Freq: Every day | ORAL | Status: DC
  Administered 2013-12-10 – 2013-12-13 (×4): 5 mg via ORAL
  Filled 2013-12-10 (×4): qty 1

## 2013-12-10 MED ORDER — INSULIN ASPART 100 UNIT/ML ~~LOC~~ SOLN
0.0000 [IU] | Freq: Three times a day (TID) | SUBCUTANEOUS | Status: DC
  Administered 2013-12-10: 1 [IU] via SUBCUTANEOUS
  Administered 2013-12-10 (×2): 2 [IU] via SUBCUTANEOUS
  Administered 2013-12-12: 1 [IU] via SUBCUTANEOUS
  Administered 2013-12-12: 2 [IU] via SUBCUTANEOUS
  Administered 2013-12-12: 1 [IU] via SUBCUTANEOUS
  Administered 2013-12-13: 2 [IU] via SUBCUTANEOUS

## 2013-12-10 MED ORDER — LOSARTAN POTASSIUM 50 MG PO TABS
100.0000 mg | ORAL_TABLET | Freq: Every day | ORAL | Status: DC
  Administered 2013-12-10 – 2013-12-13 (×4): 100 mg via ORAL
  Filled 2013-12-10 (×4): qty 2

## 2013-12-10 NOTE — Progress Notes (Signed)
VASCULAR LAB PRELIMINARY  PRELIMINARY  PRELIMINARY  PRELIMINARY  Bilateral lower extremity venous Dopplers completed.    Preliminary report:  There is no DVT or SVT noted in the bilateral lower extremities.   Iantha Fallen, RVT 12/10/2013, 11:07 AM

## 2013-12-10 NOTE — Progress Notes (Signed)
[facility] PROGRESS NOTE  Rebecca Perkins XTG:[id number] DOB: Jul 04, 1929 DOA: 12/09/2013 PCP: Odette Fraction, MD  Assessment/Plan: 1. Community acquire pneumonia. -Patient presenting with clinical signs and symptoms suggestive of pneumonia -Chest x-ray on admission showing infiltrate in the left mid lung consistent with acute pneumonia -Continue empiric IV antibiotic coverage with azithromycin 500 mg IV every 24 hours and ceftriaxone 1 g IV every 24 hours -Blood cultures pending  2.  Sepsis -Present on admission, evidenced by a respiratory rate of 30, temperature 102.6, heart rate of 102, white count of 12,900 -Source of infection likely to be pneumonia given chest x-ray findings and clinical presentation -Blood cultures pending at time of this dictation -Continue supportive care, continuous cardiac monitoring, empiric IV antibiotic therapy with ceftriaxone and azithromycin  3.  Acute respiratory failure -Evidence by respiratory of 30 on presentation -Likely secondary to community acquire pneumonia -Provide supplemental oxygen, empiric IV antibiotic therapy, cardiac monitoring  4. Type 2 diabetes mellitus -Blood sugars controlled -Continue Lantus 60 units subcutaneous daily, Accu-Cheks Q. a.c. and each bedtime with sliding scale coverage  5. Hypertension -Patient on amlodipine 5 mg by mouth daily and metoprolol 25 mg by mouth twice a day, will monitor blood pressures closely Code Status: Full Code Family Communication: Spoke with son present at bedside Disposition Plan: Continue emperic IV antibiotic therapy    Antibiotics:  Azithromycin 500 mg IV every 24 hours  Ceftriaxone 1 g IV every 24 hours  HPI/Subjective: Patient is a pleasant 80 year old female with a past medical history of type 2 diabetes mellitus, hypertension, admitted to the medicine service on 12/10/2013 presenting with complaints of cough, shortness of breath, fever and generalized weakness. She  had a chest x-ray on admission which showed rounded infiltrate in the left mid lung consistent with an acute pneumonia. Patient was started on empiric IV antibiotic therapy with ceftriaxone and azithromycin. This morning's exam she complains of ongoing cough and shortness of breath with exertion.  Objective: Filed Vitals:   12/10/13 1401  BP: 109/43  Pulse: 78  Temp: 99.7 F (37.6 C)  Resp: 18    Intake/Output Summary (Last 24 hours) at 12/10/13 1431 Last data filed at 12/10/13 1401  Gross per 24 hour  Intake 2533.33 ml  Output    725 ml  Net 1808.33 ml   Filed Weights   12/09/13 1925 12/10/13 0058  Weight: 70.761 kg (156 lb) 72.4 kg (159 lb 9.8 oz)    Exam:   General:  Patient is in mild distress, awake alert oriented, becoming dyspneic with exertion  Cardiovascular: Regular rate rhythm normal S1-S2  Respiratory: Positive bibasilar crackles, rhonchi most prominent in left lung  Abdomen: Soft nontender nondistended  Musculoskeletal: No edema  Data Reviewed: Basic Metabolic Panel:  Recent Labs Lab 12/09/13 1935 12/10/13 0420  NA 136*  --   K 3.5*  --   CL 97  --   CO2 19  --   GLUCOSE 292*  --   BUN 15  --   CREATININE 1.06 0.92  CALCIUM 9.5  --    Liver Function Tests:  Recent Labs Lab 12/09/13 1935  AST 28  ALT 12  ALKPHOS 85  BILITOT 0.7  PROT 7.5  ALBUMIN 3.1*   No results found for this basename: LIPASE, AMYLASE,  in the last 168 hours No results found for this basename: AMMONIA,  in the last 168 hours CBC:  Recent Labs Lab 12/09/13 1935 12/10/13 0420  WBC 12.9* 10.8*  NEUTROABS 11.2*  --  HGB 11.0* 10.1*  HCT 33.7* 30.8*  MCV 83.8 84.2  PLT 185 172   Cardiac Enzymes:  Recent Labs Lab 12/10/13 0420  CKTOTAL 495*  TROPONINI <0.30   BNP (last 3 results) No results found for this basename: PROBNP,  in the last 8760 hours CBG:  Recent Labs Lab 12/09/13 1929 12/10/13 0108 12/10/13 0752 12/10/13 1058  GLUCAP 292* 267*  171* 159*    No results found for this or any previous visit (from the past 240 hour(s)).   Studies: Dg Chest 2 View  12/09/2013   CLINICAL DATA:  Shortness of breath and fever  EXAM: CHEST  2 VIEW  COMPARISON:  None.  FINDINGS: Somewhat rounded patchy infiltrate is noted in the left mid lung anteriorly projecting in left upper lobe consistent with an acute pneumonia. No other focal infiltrate is seen. The cardiac shadow is within normal limits. No acute bony abnormality is noted. Compression deformity is noted in the lower thoracic spine likely of a chronic nature.  IMPRESSION: Rounded infiltrate in the left mid lung consistent with acute pneumonia. Followup films to resolution are recommended.  Thoracic compression deformity which appears chronic in nature.   Electronically Signed   By: Inez Catalina M.D.   On: 12/09/2013 21:33    Scheduled Meds: . amLODipine  5 mg Oral Daily  . aspirin EC  81 mg Oral Daily  . azithromycin  500 mg Oral QHS  . cefTRIAXone (ROCEPHIN)  IV  1 g Intravenous Q24H  . enoxaparin (LOVENOX) injection  40 mg Subcutaneous Q24H  . gemfibrozil  600 mg Oral BID AC  . insulin aspart  0-9 Units Subcutaneous TID WC  . insulin aspart  5 Units Subcutaneous TID WC  . insulin glargine  60 Units Subcutaneous Q breakfast  . losartan  100 mg Oral Daily  . metoprolol  25 mg Oral BID  . multivitamin with minerals  1 tablet Oral Daily  . omega-3 acid ethyl esters  1 g Oral Daily  . pantoprazole  40 mg Oral Daily  . pyridOXINE  100 mg Oral Daily  . sodium chloride  3 mL Intravenous Q12H   Continuous Infusions: . sodium chloride 100 mL/hr at 12/10/13 1203    Principal Problem:   Sepsis Active Problems:   Hypertension   Hypertriglyceridemia   Diabetes mellitus type II, uncontrolled   Pneumonia    Time spent: 64 min    [facility] Pager [phone]. If 7PM-7AM, please contact night-coverage at [email or web address], password [facility] 12/10/2013, 2:31 PM   LOS: 1 day

## 2013-12-10 NOTE — Care Management Note (Addendum)
  Page 1 of 1   12/13/2013     3:34:17 PM CARE MANAGEMENT NOTE 12/13/2013  Patient:  Rebecca Perkins, Rebecca Perkins   Account Number:  [id number]  Date Initiated:  12/10/2013  Documentation initiated by:  Mariann Laster  Subjective/Objective Assessment:   Admitted with Pneumonia     Action/Plan:   CM to follow for dispostion needs   Anticipated DC Date:  12/13/2013   Anticipated DC Plan:  [place]  CM consult      Choice offered to / List presented to:  C-1 Patient   DME arranged  [facility]      DME agency  [place].        Status of service:  Completed, signed off Medicare Important Message given?   (If response is "NO", the following Medicare IM given date fields will be blank) Date Medicare IM given:   Date Additional Medicare IM given:    Discharge Disposition:  HOME/SELF CARE  Per UR Regulation:  Reviewed for med. necessity/level of care/duration of stay  If discussed at [place] of Stay Meetings, dates discussed:    Comments:  Tonisha Silvey RN, BSN, MSHL, CCM  Nurse - Case Manager, (Unit [place])  [phone]  12/13/2013 Social:  Home with Husband Hx/o [facility] resident without O2, sats dropped down to 84%on room air Home O2 ordered with [facility] - however patient does not have dx for coverage for home oxygen.  Patient has made arrangements to self pay for home oxygen. 2nd IM not given d/t patient d/c prior to CM visit. Disposition:  home / self care with home o2 provided via [place].

## 2013-12-10 NOTE — ED Provider Notes (Signed)
The patient is an 80 year old female who presents with a complaint of elevated blood sugars associated with a dry hacking cough and a fever as high as 103.2 prior to arrival. On exam the patient has slight tachypnea, slight tachycardia, rales in the left lung, clear lung sounds on the right. There is no peripheral edema. Her chest x-ray reveals that she is around left midlung opacity consistent with acute pneumonia, a leukocytosis of over 12,000, persistent tachycardia. She meets criteria for systemic inflammatory response syndrome. She will be given IV antibiotics, IV fluids and will need to be admitted to the hospital as she likely has early sepsis. Her hyperglycemia has improved with treatment as well.  She has remained tachycardic as well but no hypotension.  IM service will admit.  CRITICAL CARE Performed by: Johnna Acosta Total critical care time: 30 Critical care time was exclusive of separately billable procedures and treating other patients. Critical care was necessary to treat or prevent imminent or life-threatening deterioration. Critical care was time spent personally by me on the following activities: development of treatment plan with patient and/or surrogate as well as nursing, discussions with consultants, evaluation of patient's response to treatment, examination of patient, obtaining history from patient or surrogate, ordering and performing treatments and interventions, ordering and review of laboratory studies, ordering and review of radiographic studies, pulse oximetry and re-evaluation of patient's condition.  Diagnosis:  1.  Sepsis 2.  CAP 3.  Hyperglycemia  I saw and evaluated the patient, reviewed the resident's note and I agree with the findings and plan.  I personally interpreted the EKG as well as the resident and agree with the interpretation on the resident's chart.    Johnna Acosta, MD 12/10/13 [phone]

## 2013-12-10 NOTE — H&P (Signed)
[facility] History and Physical  [name] QMV:[id number] DOB: [birth year] DOA: 12/09/2013  Referring physician: ER physician. PCP: Odette Fraction, MD   Chief Complaint: Fever and weakness.  HPI: Rebecca Perkins is a 80 y.o. female with history of diabetes mellitus, hypertension, hyperlipidemia, reflux sympathetic dystrophic presents to the ER because of fever over the last 24 hours with weakness and fatigue over the last 2-3 days. Patient also has been having nonproductive cough. Denies any chest pain nausea vomiting abdominal pain diarrhea. In the ER chest x-ray shows infiltrates concerning for pneumonia. Patient also has been noticed to have some increasing edema of the left lower extremity. Denies any recent travel or sick contacts. Patient has been admitted for pneumonia. Patient also noticed that patient pressure has been running high. Patient states she has been taking her medications as advised. Initially when patient came patient was tachycardic and was given 2 L of normal saline bolus after which patient's heart rate improved. Patient's blood work showed mild leukocytosis.  Review of Systems: As presented in the history of presenting illness, rest negative.  Past Medical History  Diagnosis Date  . Diabetes mellitus   . Hypertension   . Dyslipidemia   . Varicose veins   . Migraines   . GERD (gastroesophageal reflux disease)   . RSD (reflex sympathetic dystrophy) 2007    R wrist/hand following fx   Past Surgical History  Procedure Laterality Date  . Umbilical hernia repair    . Breast cyst excision    . Tubal ligation    . Cholecystectomy  1989  . Appendectomy  1966  . Abdominal hysterectomy  1988  . Several benign cyst removed      last 1 in 1972  . Cataract extraction  10/2009   Social History:  reports that she has never smoked. She has never used smokeless tobacco. She reports that she does not drink alcohol or use illicit drugs. Where does patient  live home. Can patient participate in ADLs? Yes.  Allergies  Allergen Reactions  . Lipitor [Atorvastatin Calcium] Diarrhea  . Niacin And Related Hives  . Statins Diarrhea  . Macrobid [Nitrofurantoin Macrocrystal] Other (See Comments)    unknown    Family History:  Family History  Problem Relation Age of Onset  . Stroke Mother 78  . Hypertension Mother   . Clotting disorder Father   . Heart attack Father   . Arrhythmia Sister   . Stroke Brother       Prior to Admission medications   Medication Sig Start Date End Date Taking? Authorizing Provider  amLODipine (NORVASC) 5 MG tablet Take 5 mg by mouth daily.   Yes Historical Provider, MD  aspirin EC 81 MG tablet Take 81 mg by mouth daily.   Yes Historical Provider, MD  Calcium Carbonate-Vitamin D (CALTRATE 600+D PO) Take 1 tablet by mouth 2 (two) times daily.   Yes Historical Provider, MD  fish oil-omega-3 fatty acids 1000 MG capsule Take 2 g by mouth 2 (two) times daily.     Yes Historical Provider, MD  furosemide (LASIX) 40 MG tablet Take 1 tablet (40 mg total) by mouth daily. 08/13/13  Yes [name], MD  gemfibrozil (LOPID) 600 MG tablet Take 1 tablet (600 mg total) by mouth 2 (two) times daily before a meal. 08/23/13  Yes [name], MD  ibuprofen (ADVIL,MOTRIN) 200 MG tablet Take 400 mg by mouth every 6 (six) hours as needed for fever.   Yes Historical Provider,  MD  insulin aspart (NOVOLOG) 100 UNIT/ML injection Inject 5U SQ with breakfast, 7U SQ with lunch and 7U SQ with dinner. 12/06/13  Yes [name], MD  insulin glargine (LANTUS) 100 UNIT/ML injection Inject 60 Units into the skin daily with breakfast. Pt uses vials   Yes Historical Provider, MD  losartan (COZAAR) 100 MG tablet Take 100 mg by mouth daily.   Yes Historical Provider, MD  metoprolol (LOPRESSOR) 50 MG tablet Take 25 mg by mouth 2 (two) times daily.   Yes Historical Provider, MD  Multiple Vitamin (MULTIVITAMIN) tablet Take 1 tablet by mouth daily.      Yes Historical Provider, MD  omeprazole (PRILOSEC) 20 MG capsule Take 20 mg by mouth daily.   Yes Historical Provider, MD  potassium chloride SA (K-DUR,KLOR-CON) 20 MEQ tablet Take 20 mEq by mouth 2 (two) times daily.   Yes Historical Provider, MD  Pyridoxine HCl (VITAMIN B-6) 100 MG tablet Take 100 mg by mouth daily.     Yes Historical Provider, MD    Physical Exam: Filed Vitals:   12/09/13 2300 12/09/13 2330 12/09/13 2345 12/10/13 0015  BP: 121/50 109/53 104/40 104/49  Pulse: 98 94 95 92  Temp:      TempSrc:      Resp:      Height:      Weight:      SpO2: 94% 94% 98% 94%     General:  Well-developed and nourished.  Eyes: Anicteric no pallor.  ENT: No discharge from the ears eyes nose mouth.  Neck: No mass felt.  Cardiovascular: S1-S2 heard.  Respiratory: No rhonchi or crepitations.  Abdomen: Soft nontender bowel sounds present. No guarding or rigidity.  Skin: No rash.  Musculoskeletal: Edema of the left lower extremity.  Psychiatric: Appears normal.  Neurologic: Alert awake oriented to time place and person. Moves all extremities.  Labs on Admission:  Basic Metabolic Panel:  Recent Labs Lab 12/09/13 1935  NA 136*  K 3.5*  CL 97  CO2 19  GLUCOSE 292*  BUN 15  CREATININE 1.06  CALCIUM 9.5   Liver Function Tests:  Recent Labs Lab 12/09/13 1935  AST 28  ALT 12  ALKPHOS 85  BILITOT 0.7  PROT 7.5  ALBUMIN 3.1*   No results found for this basename: LIPASE, AMYLASE,  in the last 168 hours No results found for this basename: AMMONIA,  in the last 168 hours CBC:  Recent Labs Lab 12/09/13 1935  WBC 12.9*  NEUTROABS 11.2*  HGB 11.0*  HCT 33.7*  MCV 83.8  PLT 185   Cardiac Enzymes: No results found for this basename: CKTOTAL, CKMB, CKMBINDEX, TROPONINI,  in the last 168 hours  BNP (last 3 results) No results found for this basename: PROBNP,  in the last 8760 hours CBG:  Recent Labs Lab 12/09/13 1929  GLUCAP 292*    Radiological  Exams on Admission: Dg Chest 2 View  12/09/2013   CLINICAL DATA:  Shortness of breath and fever  EXAM: CHEST  2 VIEW  COMPARISON:  None.  FINDINGS: Somewhat rounded patchy infiltrate is noted in the left mid lung anteriorly projecting in left upper lobe consistent with an acute pneumonia. No other focal infiltrate is seen. The cardiac shadow is within normal limits. No acute bony abnormality is noted. Compression deformity is noted in the lower thoracic spine likely of a chronic nature.  IMPRESSION: Rounded infiltrate in the left mid lung consistent with acute pneumonia. Followup films to resolution are recommended.  Thoracic compression deformity which appears chronic in nature.   Electronically Signed   By: Inez Catalina M.D.   On: 12/09/2013 21:33     Assessment/Plan Principal Problem:   Sepsis Active Problems:   Hypertension   Hypertriglyceridemia   Diabetes mellitus type II, uncontrolled   Pneumonia   1. Possible sepsis from pneumonia - continue with ceftriaxone and Zithromax. Check urine Legionella and strep antigen. Follow blood cultures. Since patient is complaining of weakness and fatigue check CK level. 2. Diabetes mellitus type 2 uncontrolled - check hemoglobin A1c. Presently have placed patient on sliding-scale coverage and home medications. 3. Hypertension - continue home medications. 4. Hyperlipidemia/hypertriglyceridemia - continue gemfibrozil. Check CK levels. 5. Left lower extremity edema - check Dopplers to rule out DVT. 6. Chronic anemia - follow CBC and check anemia panel.    Code Status: Full code.  Family Communication: Patient's family.  Disposition Plan: Admit to inpatient.    [facility] Pager [phone].  If 7PM-7AM, please contact night-coverage [email or web address] Password [facility] 12/10/2013, 12:41 AM

## 2013-12-10 NOTE — ED Notes (Signed)
Transporting patient to new room assignment. 

## 2013-12-10 NOTE — Progress Notes (Signed)
UR completed Shaun Zuccaro K. Tani Virgo, RN, BSN, [place], CCM  12/10/2013 6:34 PM

## 2013-12-11 ENCOUNTER — Ambulatory Visit: Payer: Medicare Other | Admitting: Physical Therapy

## 2013-12-11 DIAGNOSIS — IMO0001 Reserved for inherently not codable concepts without codable children: Secondary | ICD-10-CM

## 2013-12-11 DIAGNOSIS — E1165 Type 2 diabetes mellitus with hyperglycemia: Secondary | ICD-10-CM

## 2013-12-11 DIAGNOSIS — E119 Type 2 diabetes mellitus without complications: Secondary | ICD-10-CM

## 2013-12-11 DIAGNOSIS — J189 Pneumonia, unspecified organism: Secondary | ICD-10-CM

## 2013-12-11 LAB — BASIC METABOLIC PANEL WITH GFR
BUN: 13 mg/dL (ref 6–23)
CO2: 22 meq/L (ref 19–32)
Calcium: 8.9 mg/dL (ref 8.4–10.5)
Chloride: 106 meq/L (ref 96–112)
Creatinine, Ser: 0.9 mg/dL (ref 0.50–1.10)
GFR calc Af Amer: 67 mL/min — ABNORMAL LOW
GFR calc non Af Amer: 58 mL/min — ABNORMAL LOW
Glucose, Bld: 85 mg/dL (ref 70–99)
Potassium: 3.6 meq/L — ABNORMAL LOW (ref 3.7–5.3)
Sodium: 140 meq/L (ref 137–147)

## 2013-12-11 LAB — CBC
HCT: 28.2 % — ABNORMAL LOW (ref 36.0–46.0)
Hemoglobin: 9.3 g/dL — ABNORMAL LOW (ref 12.0–15.0)
MCH: 27.4 pg (ref 26.0–34.0)
MCHC: 33 g/dL (ref 30.0–36.0)
MCV: 83.2 fL (ref 78.0–100.0)
Platelets: 170 K/uL (ref 150–400)
RBC: 3.39 MIL/uL — ABNORMAL LOW (ref 3.87–5.11)
RDW: 12.6 % (ref 11.5–15.5)
WBC: 7.9 K/uL (ref 4.0–10.5)

## 2013-12-11 LAB — IRON AND TIBC
Iron: <10 ug/dL — ABNORMAL LOW (ref 42–135)
UIBC: 214 ug/dL (ref 125–400)

## 2013-12-11 LAB — GLUCOSE, CAPILLARY
Glucose-Capillary: 84 mg/dL (ref 70–99)
Glucose-Capillary: 114 mg/dL — ABNORMAL HIGH (ref 70–99)
Glucose-Capillary: 100 mg/dL — ABNORMAL HIGH (ref 70–99)
Glucose-Capillary: 49 mg/dL — ABNORMAL LOW (ref 70–99)
Glucose-Capillary: 194 mg/dL — ABNORMAL HIGH (ref 70–99)

## 2013-12-11 LAB — FOLATE: Folate: >20 ng/mL

## 2013-12-11 NOTE — Progress Notes (Signed)
[facility] PROGRESS NOTE  KENETHA COZZA GUR:[id number] DOB: 12-04-1928 DOA: 12/09/2013 PCP: Odette Fraction, MD  Interim Summary Patient is a pleasant 80 year old female with a past medical history of type 2 diabetes mellitus, hypertension, dyslipidemia, who was admitted to the medicine service on 12/10/2013. She presented with complaints of fever, chills, generalized weakness, shortness of breath. Chest x-ray on admission showing a rounded infiltrate in the mid lung consistent with acute pneumonia. She was started on empiric IV antimicrobial therapy with IV azithromycin and ceftriaxone. Blood cultures drawn on  12/09/2013 showing no growth up to date. During this hospitalization she continues to spike temperatures, having a MAXIMUM TEMPERATURE of 1.9 overnight. On my evaluation on 12/11/2013 she appears to be improving, as if she did not appear to be as a dyspneic at rest, tolerating by mouth intake, ambulating around her room.                                                                                                                                                                                                                                           Assessment/Plan: 1. Community acquire pneumonia. -Patient presenting with clinical signs and symptoms suggestive of pneumonia -Chest x-ray on admission showing infiltrate in the left mid lung consistent with acute pneumonia -Continue empiric IV antibiotic coverage with azithromycin 500 mg IV every 24 hours and ceftriaxone 1 g IV every 24 hours -White count trending down to 7900 from 12,900 on admission -Blood cultures remain negative -Will repeat chest x-ray in a.m.  2.  Sepsis -Present on admission, evidenced by a respiratory rate of 30, temperature 102.6, heart rate of 102, white count of 12,900 -Source of infection likely to be pneumonia given chest x-ray findings and clinical presentation -Blood cultures remain  negative -Continue supportive care, continuous cardiac monitoring, empiric IV antibiotic therapy with ceftriaxone and azithromycin  3.  Acute respiratory failure -Evidence by respiratory of 30 on presentation -Likely secondary to community acquire pneumonia -Provide supplemental oxygen and empiric IV antibiotic therapy  4. Type 2 diabetes mellitus -Blood sugars controlled -Continue Lantus 60 units subcutaneous daily, Accu-Cheks Q. a.c. and each bedtime with sliding scale coverage  5. Hypertension -Patient on amlodipine 5 mg by mouth daily and metoprolol 25 mg by mouth twice a day, will monitor blood pressures closely  Code Status: Full Code Family Communication: Spoke with son present at bedside Disposition Plan:  Continue emperic IV antibiotic therapy    Antibiotics:  Azithromycin 500 mg IV every 24 hours  Ceftriaxone 1 g IV every 24 hours  HPI/Subjective:  She reports feeling a little better today, walking around her room, tolerating by mouth intake. States feeling as short of breath as when she was first admitted Objective: Filed Vitals:   12/11/13 0513  BP: 138/57  Pulse: 88  Temp: 98.4 F (36.9 C)  Resp: 22    Intake/Output Summary (Last 24 hours) at 12/11/13 1521 Last data filed at 12/11/13 1242  Gross per 24 hour  Intake    600 ml  Output    304 ml  Net    296 ml   Filed Weights   12/09/13 1925 12/10/13 0058 12/11/13 0513  Weight: 70.761 kg (156 lb) 72.4 kg (159 lb 9.8 oz) 71.889 kg (158 lb 7.8 oz)    Exam:   General:  Patient is in mild distress, awake alert oriented, becoming dyspneic with exertion  Cardiovascular: Regular rate rhythm normal S1-S2  Respiratory: Positive bibasilar crackles, rhonchi most prominent in left lung  Abdomen: Soft nontender nondistended  Musculoskeletal: No edema  Data Reviewed: Basic Metabolic Panel:  Recent Labs Lab 12/09/13 1935 12/10/13 0420 12/11/13 0425  NA 136*  --  140  K 3.5*  --  3.6*  CL 97  --   106  CO2 19  --  22  GLUCOSE 292*  --  85  BUN 15  --  13  CREATININE 1.06 0.92 0.90  CALCIUM 9.5  --  8.9   Liver Function Tests:  Recent Labs Lab 12/09/13 1935  AST 28  ALT 12  ALKPHOS 85  BILITOT 0.7  PROT 7.5  ALBUMIN 3.1*   No results found for this basename: LIPASE, AMYLASE,  in the last 168 hours No results found for this basename: AMMONIA,  in the last 168 hours CBC:  Recent Labs Lab 12/09/13 1935 12/10/13 0420 12/11/13 0425  WBC 12.9* 10.8* 7.9  NEUTROABS 11.2*  --   --   HGB 11.0* 10.1* 9.3*  HCT 33.7* 30.8* 28.2*  MCV 83.8 84.2 83.2  PLT 185 172 170   Cardiac Enzymes:  Recent Labs Lab 12/10/13 0420  CKTOTAL 495*  TROPONINI <0.30   BNP (last 3 results) No results found for this basename: PROBNP,  in the last 8760 hours CBG:  Recent Labs Lab 12/10/13 1058 12/10/13 1609 12/10/13 2115 12/11/13 0559 12/11/13 1100  GLUCAP 159* 126* 105* 84 114*    Recent Results (from the past 240 hour(s))  CULTURE, BLOOD (ROUTINE X 2)     Status: None   Collection Time    12/09/13 10:15 PM      Result Value Ref Range Status   Specimen Description BLOOD LEFT ARM   Final   Special Requests BOTTLES DRAWN AEROBIC AND ANAEROBIC 10CC EACH   Final   Culture  Setup Time     Final   Value: 12/10/2013 01:08     Performed at [facility]   Culture     Final   Value:        BLOOD CULTURE RECEIVED NO GROWTH TO DATE CULTURE WILL BE HELD FOR 5 DAYS BEFORE ISSUING A FINAL NEGATIVE REPORT     Performed at [facility]   Report Status PENDING   Incomplete  CULTURE, BLOOD (ROUTINE X 2)     Status: None   Collection Time    12/09/13 10:22 PM  Result Value Ref Range Status   Specimen Description BLOOD LEFT HAND   Final   Special Requests BOTTLES DRAWN AEROBIC ONLY 10CC   Final   Culture  Setup Time     Final   Value: 12/10/2013 01:08     Performed at [facility]   Culture     Final   Value:        BLOOD CULTURE RECEIVED NO GROWTH TO DATE  CULTURE WILL BE HELD FOR 5 DAYS BEFORE ISSUING A FINAL NEGATIVE REPORT     Performed at [facility]   Report Status PENDING   Incomplete  URINE CULTURE     Status: None   Collection Time    12/10/13  1:17 AM      Result Value Ref Range Status   Specimen Description URINE, CLEAN CATCH   Final   Special Requests NONE   Final   Culture  Setup Time     Final   Value: 12/10/2013 01:32     Performed at [place]     Final   Value: NO GROWTH     Performed at [facility]   Culture     Final   Value: NO GROWTH     Performed at [facility]   Report Status 12/10/2013 FINAL   Final     Studies: Dg Chest 2 View  12/09/2013   CLINICAL DATA:  Shortness of breath and fever  EXAM: CHEST  2 VIEW  COMPARISON:  None.  FINDINGS: Somewhat rounded patchy infiltrate is noted in the left mid lung anteriorly projecting in left upper lobe consistent with an acute pneumonia. No other focal infiltrate is seen. The cardiac shadow is within normal limits. No acute bony abnormality is noted. Compression deformity is noted in the lower thoracic spine likely of a chronic nature.  IMPRESSION: Rounded infiltrate in the left mid lung consistent with acute pneumonia. Followup films to resolution are recommended.  Thoracic compression deformity which appears chronic in nature.   Electronically Signed   By: Inez Catalina M.D.   On: 12/09/2013 21:33    Scheduled Meds: . amLODipine  5 mg Oral Daily  . aspirin EC  81 mg Oral Daily  . azithromycin  500 mg Oral QHS  . cefTRIAXone (ROCEPHIN)  IV  1 g Intravenous Q24H  . enoxaparin (LOVENOX) injection  40 mg Subcutaneous Q24H  . gemfibrozil  600 mg Oral BID AC  . insulin aspart  0-9 Units Subcutaneous TID WC  . insulin aspart  5 Units Subcutaneous TID WC  . insulin glargine  60 Units Subcutaneous Q breakfast  . losartan  100 mg Oral Daily  . metoprolol  25 mg Oral BID  . multivitamin with minerals  1 tablet Oral Daily  .  omega-3 acid ethyl esters  1 g Oral Daily  . pantoprazole  40 mg Oral Daily  . pyridOXINE  100 mg Oral Daily  . sodium chloride  3 mL Intravenous Q12H   Continuous Infusions:    Principal Problem:   Sepsis Active Problems:   Hypertension   Hypertriglyceridemia   Diabetes mellitus type II, uncontrolled   Pneumonia    Time spent: 25 min    [facility] Pager [phone]. If 7PM-7AM, please contact night-coverage at [email or web address], password [facility] 12/11/2013, 3:21 PM  LOS: 2 days

## 2013-12-12 ENCOUNTER — Inpatient Hospital Stay (HOSPITAL_COMMUNITY): Payer: Medicare Other

## 2013-12-12 DIAGNOSIS — R7309 Other abnormal glucose: Secondary | ICD-10-CM

## 2013-12-12 DIAGNOSIS — I10 Essential (primary) hypertension: Secondary | ICD-10-CM

## 2013-12-12 LAB — GLUCOSE, CAPILLARY
Glucose-Capillary: 189 mg/dL — ABNORMAL HIGH (ref 70–99)
Glucose-Capillary: 140 mg/dL — ABNORMAL HIGH (ref 70–99)
Glucose-Capillary: 133 mg/dL — ABNORMAL HIGH (ref 70–99)
Glucose-Capillary: 122 mg/dL — ABNORMAL HIGH (ref 70–99)
Glucose-Capillary: 78 mg/dL (ref 70–99)

## 2013-12-12 MED ORDER — LEVOFLOXACIN 750 MG PO TABS: 750.0000 mg | ORAL_TABLET | Freq: Every day | ORAL | Status: DC

## 2013-12-12 MED ORDER — POTASSIUM CHLORIDE CRYS ER 20 MEQ PO TBCR
20.0000 meq | EXTENDED_RELEASE_TABLET | Freq: Two times a day (BID) | ORAL | Status: DC
  Administered 2013-12-12 – 2013-12-13 (×3): 20 meq via ORAL
  Filled 2013-12-12 (×4): qty 1

## 2013-12-12 MED ORDER — LEVOFLOXACIN 750 MG PO TABS
750.0000 mg | ORAL_TABLET | ORAL | Status: DC
  Administered 2013-12-12: 750 mg via ORAL
  Filled 2013-12-12: qty 1

## 2013-12-12 MED ORDER — FUROSEMIDE 40 MG PO TABS
40.0000 mg | ORAL_TABLET | Freq: Every day | ORAL | Status: DC
  Administered 2013-12-12 – 2013-12-13 (×2): 40 mg via ORAL
  Filled 2013-12-12 (×2): qty 1

## 2013-12-12 NOTE — Progress Notes (Signed)
Pt up and about in room without O2 in no resp distress. Sat checked -89%. Ambulated in hallway with sats dropping to 81%. Returned to room and o2 at 2l [place] applied.

## 2013-12-12 NOTE — Progress Notes (Signed)
Pt sitting at bedside in chair  without O2. Sat 89%. O2 at 2l reapplied. Denies resp distress

## 2013-12-12 NOTE — Progress Notes (Signed)
[facility] PROGRESS NOTE  Rebecca Perkins [id number] DOB: [birth year] DOA: 12/09/2013 PCP: Odette Fraction, MD  Interim Summary Patient is a pleasant 80 year old female with a past medical history of type 2 diabetes mellitus, hypertension, dyslipidemia, who was admitted to the medicine service on 12/10/2013. She presented with complaints of fever, chills, generalized weakness, shortness of breath. Chest x-ray on admission showing a rounded infiltrate in the mid lung consistent with acute pneumonia. She was started on empiric IV antimicrobial therapy with IV azithromycin and ceftriaxone. Blood cultures drawn on  12/09/2013 showing no growth up to date. During this hospitalization she continues to spike temperatures, having a MAXIMUM TEMPERATURE of 1.9 overnight. On my evaluation on 12/11/2013 she appears to be improving, as if she did not appear to be as a dyspneic at rest, tolerating by mouth intake, ambulating around her room.                                                                                                                                                                                                                                           Assessment/Plan: Community acquire pneumonia. - Chest x-ray on admission showing infiltrate in the left mid lung consistent with acute pneumonia - Continue empiric IV antibioticazithromycin  and ceftriaxone, de-esclate to levaquin. - Blood cultures remain negative - Will repeat chest x-ray in a.m.  Sepsis -Present on admission, evidenced by a respiratory rate of 30, temperature 102.6, heart rate of 102, white count of 12,900 -Source of infection likely to be pneumonia given chest x-ray findings and clinical presentation -Blood cultures remain negative  Acute respiratory failure -Evidence by respiratory of 30 on presentation -Likely secondary to community acquire pneumonia -Provide supplemental oxygen and empiric IV antibiotic  therapy  Type 2 diabetes mellitus -Blood sugars controlled -Continue Lantus 60 units subcutaneous daily, Accu-Cheks Q. a.c. and each bedtime with sliding scale coverage   Hypertension -Patient on amlodipine 5 mg by mouth daily and metoprolol 25 mg by mouth twice a day, will monitor blood pressures closely  Code Status: Full Code Family Communication: Spoke with son present at bedside Disposition Plan: Continue emperic IV antibiotic therapy    Antibiotics:  Azithromycin 500 mg IV every 24 hours  Ceftriaxone 1 g IV every 24 hours  HPI/Subjective:  She reports feeling a little better today. Only SOB with ambulation Objective: Filed Vitals:   12/12/13 1023  BP: 127/56  Pulse: 88  Temp:   Resp:     Intake/Output Summary (Last 24 hours) at 12/12/13 1218 Last data filed at 12/12/13 0900  Gross per 24 hour  Intake    840 ml  Output   1303 ml  Net   -463 ml   Filed Weights   12/10/13 0058 12/11/13 0513 12/12/13 0453  Weight: 72.4 kg (159 lb 9.8 oz) 71.889 kg (158 lb 7.8 oz) 73.8 kg (162 lb 11.2 oz)    Exam:   General:  Patient is in mild distress, awake alert oriented, becoming dyspneic with exertion  Cardiovascular: Regular rate rhythm normal S1-S2  Respiratory: Positive bibasilar crackles, rhonchi most prominent in left lung  Abdomen: Soft nontender nondistended  Musculoskeletal: No edema  Data Reviewed: Basic Metabolic Panel:  Recent Labs Lab 12/09/13 1935 12/10/13 0420 12/11/13 0425  NA 136*  --  140  K 3.5*  --  3.6*  CL 97  --  106  CO2 19  --  22  GLUCOSE 292*  --  85  BUN 15  --  13  CREATININE 1.06 0.92 0.90  CALCIUM 9.5  --  8.9   Liver Function Tests:  Recent Labs Lab 12/09/13 1935  AST 28  ALT 12  ALKPHOS 85  BILITOT 0.7  PROT 7.5  ALBUMIN 3.1*   No results found for this basename: LIPASE, AMYLASE,  in the last 168 hours No results found for this basename: AMMONIA,  in the last 168 hours CBC:  Recent Labs Lab  12/09/13 1935 12/10/13 0420 12/11/13 0425  WBC 12.9* 10.8* 7.9  NEUTROABS 11.2*  --   --   HGB 11.0* 10.1* 9.3*  HCT 33.7* 30.8* 28.2*  MCV 83.8 84.2 83.2  PLT 185 172 170   Cardiac Enzymes:  Recent Labs Lab 12/10/13 0420  CKTOTAL 495*  TROPONINI <0.30   BNP (last 3 results) No results found for this basename: PROBNP,  in the last 8760 hours CBG:  Recent Labs Lab 12/11/13 1641 12/11/13 1722 12/11/13 2115 12/12/13 0631 12/12/13 1133  GLUCAP 49* 100* 194* 189* 140*    Recent Results (from the past 240 hour(s))  CULTURE, BLOOD (ROUTINE X 2)     Status: None   Collection Time    12/09/13 10:15 PM      Result Value Ref Range Status   Specimen Description BLOOD LEFT ARM   Final   Special Requests BOTTLES DRAWN AEROBIC AND ANAEROBIC 10CC EACH   Final   Culture  Setup Time     Final   Value: 12/10/2013 01:08     Performed at [facility]   Culture     Final   Value:        BLOOD CULTURE RECEIVED NO GROWTH TO DATE CULTURE WILL BE HELD FOR 5 DAYS BEFORE ISSUING A FINAL NEGATIVE REPORT     Performed at [facility]   Report Status PENDING   Incomplete  CULTURE, BLOOD (ROUTINE X 2)     Status: None   Collection Time    12/09/13 10:22 PM      Result Value Ref Range Status   Specimen Description BLOOD LEFT HAND   Final   Special Requests BOTTLES DRAWN AEROBIC ONLY 10CC   Final   Culture  Setup Time     Final   Value: 12/10/2013 01:08     Performed at [facility]   Culture     Final   Value:  BLOOD CULTURE RECEIVED NO GROWTH TO DATE CULTURE WILL BE HELD FOR 5 DAYS BEFORE ISSUING A FINAL NEGATIVE REPORT     Performed at [facility]   Report Status PENDING   Incomplete  URINE CULTURE     Status: None   Collection Time    12/10/13  1:17 AM      Result Value Ref Range Status   Specimen Description URINE, CLEAN CATCH   Final   Special Requests NONE   Final   Culture  Setup Time     Final   Value: 12/10/2013 01:32     Performed at  [facility]     Final   Value: NO GROWTH     Performed at [facility]   Culture     Final   Value: NO GROWTH     Performed at [facility]   Report Status 12/10/2013 FINAL   Final  CULTURE, BLOOD (ROUTINE X 2)     Status: None   Collection Time    12/10/13 10:00 PM      Result Value Ref Range Status   Specimen Description BLOOD LEFT HAND   Final   Special Requests BOTTLES DRAWN AEROBIC AND ANAEROBIC 5CC   Final   Culture  Setup Time     Final   Value: 12/11/2013 04:11     Performed at [facility]   Culture     Final   Value:        BLOOD CULTURE RECEIVED NO GROWTH TO DATE CULTURE WILL BE HELD FOR 5 DAYS BEFORE ISSUING A FINAL NEGATIVE REPORT     Performed at [facility]   Report Status PENDING   Incomplete  CULTURE, BLOOD (ROUTINE X 2)     Status: None   Collection Time    12/10/13 10:05 PM      Result Value Ref Range Status   Specimen Description BLOOD LEFT ARM   Final   Special Requests BOTTLES DRAWN AEROBIC AND ANAEROBIC 5CC   Final   Culture  Setup Time     Final   Value: 12/11/2013 04:11     Performed at [facility]   Culture     Final   Value:        BLOOD CULTURE RECEIVED NO GROWTH TO DATE CULTURE WILL BE HELD FOR 5 DAYS BEFORE ISSUING A FINAL NEGATIVE REPORT     Performed at [facility]   Report Status PENDING   Incomplete     Studies: Dg Chest 2 View  12/12/2013   CLINICAL DATA:  F/u on PNA  EXAM: CHEST  2 VIEW  COMPARISON:  DG CHEST 2 VIEW dated 12/09/2013  FINDINGS: Low lung volumes. Cardiac silhouette within normal limits. The left-sided infiltrate has increased involvement in the left upper lobe and lingula. There has been increase mild prominence of the interstitial markings. Mild degenerative changes in the shoulders. Atherosclerotic calcifications in the aortic arch.  IMPRESSION: Increased distribution of the left lung infiltrate.  There also findings which may represent a component of  pulmonary vascular congestion. Continued surveillance evaluation with PA and lateral views of the chest is recommended.   Electronically Signed   By: Margaree Mackintosh M.D.   On: 12/12/2013 08:44    Scheduled Meds: . amLODipine  5 mg Oral Daily  . aspirin EC  81 mg Oral Daily  . enoxaparin (LOVENOX) injection  40 mg Subcutaneous Q24H  . gemfibrozil  600 mg Oral BID AC  . insulin aspart  0-9 Units Subcutaneous TID WC  . insulin aspart  5 Units Subcutaneous TID WC  . insulin glargine  60 Units Subcutaneous Q breakfast  . levofloxacin  750 mg Oral Q48H  . losartan  100 mg Oral Daily  . metoprolol  25 mg Oral BID  . multivitamin with minerals  1 tablet Oral Daily  . omega-3 acid ethyl esters  1 g Oral Daily  . pantoprazole  40 mg Oral Daily  . pyridOXINE  100 mg Oral Daily  . sodium chloride  3 mL Intravenous Q12H   Continuous Infusions:    Principal Problem:   Sepsis Active Problems:   Hypertension   Hypertriglyceridemia   Diabetes mellitus type II, uncontrolled   Pneumonia    Time spent: 25 min    [facility] Pager [phone] If 7PM-7AM, please contact night-coverage at [email or web address], password [facility] 12/12/2013, 12:18 PM  LOS: 3 days

## 2013-12-13 ENCOUNTER — Encounter: Payer: Self-pay | Admitting: Internal Medicine

## 2013-12-13 DIAGNOSIS — D649 Anemia, unspecified: Secondary | ICD-10-CM | POA: Diagnosis present

## 2013-12-13 LAB — GLUCOSE, CAPILLARY
Glucose-Capillary: 167 mg/dL — ABNORMAL HIGH (ref 70–99)
Glucose-Capillary: 189 mg/dL — ABNORMAL HIGH (ref 70–99)
Glucose-Capillary: 88 mg/dL (ref 70–99)

## 2013-12-13 MED ORDER — FERROUS SULFATE 325 (65 FE) MG PO TBEC: 325.0000 mg | DELAYED_RELEASE_TABLET | Freq: Three times a day (TID) | ORAL | Status: DC

## 2013-12-13 MED ORDER — LEVOFLOXACIN 750 MG PO TABS: 750.0000 mg | ORAL_TABLET | ORAL | Status: DC

## 2013-12-13 NOTE — Progress Notes (Signed)
SATURATION QUALIFICATIONS: (This note is used to comply with regulatory documentation for home oxygen)  Patient Saturations on Room Air at Rest = 88%  Patient Saturations on Room Air while Ambulating = 83%  Patient Saturations on 2 Liters of oxygen while Ambulating = 92%  Please briefly explain why patient needs home oxygen: Pt needs oxygen at home, o2 sats drop to 88% on room air at rest.

## 2013-12-13 NOTE — Progress Notes (Signed)
UR completed Phyllis Whitefield K. Mone Commisso, RN, BSN, MSHL, CCM  12/13/2013 11:15 AM

## 2013-12-13 NOTE — Plan of Care (Signed)
Problem: Phase III Progression Outcomes Goal: O2 sats > or equal to 93% on room air Outcome: Not Met (add Reason) sats were 84% when ambulating without o2. Discharged on home o2

## 2013-12-13 NOTE — Progress Notes (Signed)
Discharged home on oxygen via nasal canula. .Left with son. NA rolled patient down via wheelchair . Explained medication list and went over MD appointment times. Patient verbalized understanding.

## 2013-12-13 NOTE — Plan of Care (Signed)
Problem: Phase II Progression Outcomes Goal: Wean O2 if indicated Outcome: Not Met (add Reason) Walked resident without O2, sats dropped down to 84%on room air

## 2013-12-13 NOTE — Discharge Summary (Addendum)
Physician Discharge Summary  Rebecca Perkins [id number] DOB: 1929/06/08 DOA: 12/09/2013  PCP: Odette Fraction, MD  Admit date: 12/09/2013 Discharge date: 12/13/2013  Time spent: 40 minutes  Recommendations for Outpatient Follow-up:  1. Followup with PCP to evaluate her respirations, and repeat chest x-ray in 6 weeks. 2. Followup of the [facility] patient has a mild anemia with a normal ferritin and iron less than 10 her MCV is unremarkable. She has refused colonoscopies in the past.  Discharge Diagnoses:  Principal Problem:   Sepsis Active Problems:   Hypertension   Hypertriglyceridemia   Diabetes mellitus type II, uncontrolled   Pneumonia   Normocytic anemia   Discharge Condition: stable  Diet recommendation: heart healthy  Filed Weights   12/11/13 0513 12/12/13 0453 12/13/13 0552  Weight: 71.889 kg (158 lb 7.8 oz) 73.8 kg (162 lb 11.2 oz) 73.664 kg (162 lb 6.4 oz)    History of present illness:  80 y.o. female with history of diabetes mellitus, hypertension, hyperlipidemia, reflux sympathetic dystrophic presents to the ER because of fever over the last 24 hours with weakness and fatigue over the last 2-3 days. Patient also has been having nonproductive cough. Denies any chest pain nausea vomiting abdominal pain diarrhea. In the ER chest x-ray shows infiltrates concerning for pneumonia. Patient also has been noticed to have some increasing edema of the left lower extremity. Denies any recent travel or sick contacts. Patient has been admitted for pneumonia. Patient also noticed that patient pressure has been running high. Patient states she has been taking her medications as advised. Initially when patient came patient was tachycardic and was given 2 L of normal saline bolus after which patient's heart rate improved. Patient's blood work showed mild leukocytosis.   Hospital Course:  Sepsis  due to Community acquire pneumonia: - Present on admission, evidenced by a  respiratory rate of 30, temperature 102.6, heart rate of 102, white count of 12,900  - Chest x-ray on admission showing infiltrate in the left mid lung consistent with acute pneumonia  - Continue empiric IV antibiotic azithromycin and ceftriaxone, de-esclate to levaquin.  - Blood cultures remain negative  - She will continue Levaquin for 4 additional days.  Normocytic anemia: - Anemia panel done shows a normal RDW, B12 and RBC folate. - Her ferritin was greater than 1000 probably reactive. - Iron level is less than 10. She was started on ferrous sulfate. - She has never had a colonoscopy. She does brought no melanotic stools. - She relates that she is afraid of getting a colonoscopy. She'll have to think about it. I have given her number of gastroenterology for an appointment. We will go ahead and schedule her a new patient appointment with gastroenterology.  Acute respiratory failure  - Evidence by respiratory of 30 on presentation  - Likely secondary to community acquire pneumonia  - Provide supplemental oxygen and empiric IV antibiotic therapy   Type 2 diabetes mellitus  - Blood sugars controlled  - Continue Lantus 60 units subcutaneous daily and SSI.  Hypertension  - No changes were made.   Procedures:  CXR  Consultations:  none  Discharge Exam: Filed Vitals:   12/13/13 1006  BP: 119/53  Pulse: 89  Temp: 98.1 F (36.7 C)  Resp: 18    General: A&O x3 Cardiovascular: RRR Respiratory: good air movement CTA B/L  Discharge Instructions You were cared for by a hospitalist during your hospital stay. If you have any questions about your discharge medications or the care you  received while you were in the hospital after you are discharged, you can call the unit and asked to speak with the hospitalist on call if the hospitalist that took care of you is not available. Once you are discharged, your primary care physician will handle any further medical issues. Please note  that NO REFILLS for any discharge medications will be authorized once you are discharged, as it is imperative that you return to your primary care physician (or establish a relationship with a primary care physician if you do not have one) for your aftercare needs so that they can reassess your need for medications and monitor your lab values.      Discharge Orders   Future Appointments Provider Department Dept Phone   12/17/2013 10:15 AM Isaias Cowman, [place] [phone]   12/19/2013 8:45 AM Isaias Cowman, [place] [phone]   12/25/2013 9:30 AM [place], [place] [phone]   12/27/2013 9:30 AM Isaias Cowman, [place] [phone]   Future Orders Complete By Expires   Diet - low sodium heart healthy  As directed    Increase activity slowly  As directed        Medication List    STOP taking these medications       ibuprofen 200 MG tablet  Commonly known as:  ADVIL,MOTRIN      TAKE these medications       amLODipine 5 MG tablet  Commonly known as:  NORVASC  Take 5 mg by mouth daily.     aspirin EC 81 MG tablet  Take 81 mg by mouth daily.     CALTRATE 600+D PO  Take 1 tablet by mouth 2 (two) times daily.     ferrous sulfate 325 (65 FE) MG EC tablet  Take 1 tablet (325 mg total) by mouth 3 (three) times daily with meals.     fish oil-omega-3 fatty acids 1000 MG capsule  Take 2 g by mouth 2 (two) times daily.     furosemide 40 MG tablet  Commonly known as:  LASIX  Take 1 tablet (40 mg total) by mouth daily.     gemfibrozil 600 MG tablet  Commonly known as:  LOPID  Take 1 tablet (600 mg total) by mouth 2 (two) times daily before a meal.     insulin aspart 100 UNIT/ML injection  Commonly known as:  novoLOG  Inject 5U SQ with breakfast, 7U SQ with lunch and 7U SQ  with dinner.     insulin glargine 100 UNIT/ML injection  Commonly known as:  LANTUS  Inject 60 Units into the skin daily with breakfast. Pt uses vials     levofloxacin 750 MG tablet  Commonly known as:  LEVAQUIN  Take 1 tablet (750 mg total) by mouth every other day.     losartan 100 MG tablet  Commonly known as:  COZAAR  Take 100 mg by mouth daily.     metoprolol 50 MG tablet  Commonly known as:  LOPRESSOR  Take 25 mg by mouth 2 (two) times daily.     multivitamin tablet  Take 1 tablet by mouth daily.     omeprazole 20 MG capsule  Commonly known as:  PRILOSEC  Take 20 mg by mouth daily.     potassium chloride SA 20 MEQ tablet  Commonly known as:  K-DUR,KLOR-CON  Take 20 mEq by mouth 2 (two) times daily.  pyridOXINE 100 MG tablet  Commonly known as:  VITAMIN B-6  Take 100 mg by mouth daily.       Allergies  Allergen Reactions  . Lipitor [Atorvastatin Calcium] Diarrhea  . Niacin And Related Hives  . Statins Diarrhea  . Macrobid [Nitrofurantoin Macrocrystal] Other (See Comments)    unknown   Follow-up Information   Follow up with [facility] TOM, MD In 2 weeks. (hospital follow up)    Specialty:  Family Medicine   Contact information:   [address] [place] [address] [phone]       Follow up with [facility] In 2 weeks. (Patient had never had a colonoscopy)    Contact information:   [place] [phone]        The results of significant diagnostics from this hospitalization (including imaging, microbiology, ancillary and laboratory) are listed below for reference.    Significant Diagnostic Studies: Dg Chest 2 View  12/12/2013   CLINICAL DATA:  F/u on PNA  EXAM: CHEST  2 VIEW  COMPARISON:  DG CHEST 2 VIEW dated 12/09/2013  FINDINGS: Low lung volumes. Cardiac silhouette within normal limits. The left-sided infiltrate has increased involvement in the left upper lobe and lingula. There has been increase  mild prominence of the interstitial markings. Mild degenerative changes in the shoulders. Atherosclerotic calcifications in the aortic arch.  IMPRESSION: Increased distribution of the left lung infiltrate.  There also findings which may represent a component of pulmonary vascular congestion. Continued surveillance evaluation with PA and lateral views of the chest is recommended.   Electronically Signed   By: Margaree Mackintosh M.D.   On: 12/12/2013 08:44   Dg Chest 2 View  12/09/2013   CLINICAL DATA:  Shortness of breath and fever  EXAM: CHEST  2 VIEW  COMPARISON:  None.  FINDINGS: Somewhat rounded patchy infiltrate is noted in the left mid lung anteriorly projecting in left upper lobe consistent with an acute pneumonia. No other focal infiltrate is seen. The cardiac shadow is within normal limits. No acute bony abnormality is noted. Compression deformity is noted in the lower thoracic spine likely of a chronic nature.  IMPRESSION: Rounded infiltrate in the left mid lung consistent with acute pneumonia. Followup films to resolution are recommended.  Thoracic compression deformity which appears chronic in nature.   Electronically Signed   By: Inez Catalina M.D.   On: 12/09/2013 21:33    Microbiology: Recent Results (from the past 240 hour(s))  CULTURE, BLOOD (ROUTINE X 2)     Status: None   Collection Time    12/09/13 10:15 PM      Result Value Ref Range Status   Specimen Description BLOOD LEFT ARM   Final   Special Requests BOTTLES DRAWN AEROBIC AND ANAEROBIC 10CC EACH   Final   Culture  Setup Time     Final   Value: 12/10/2013 01:08     Performed at [facility]   Culture     Final   Value:        BLOOD CULTURE RECEIVED NO GROWTH TO DATE CULTURE WILL BE HELD FOR 5 DAYS BEFORE ISSUING A FINAL NEGATIVE REPORT     Performed at [facility]   Report Status PENDING   Incomplete  CULTURE, BLOOD (ROUTINE X 2)     Status: None   Collection Time    12/09/13 10:22 PM      Result Value Ref  Range Status  Specimen Description BLOOD LEFT HAND   Final   Special Requests BOTTLES DRAWN AEROBIC ONLY 10CC   Final   Culture  Setup Time     Final   Value: 12/10/2013 01:08     Performed at [facility]   Culture     Final   Value:        BLOOD CULTURE RECEIVED NO GROWTH TO DATE CULTURE WILL BE HELD FOR 5 DAYS BEFORE ISSUING A FINAL NEGATIVE REPORT     Performed at [facility]   Report Status PENDING   Incomplete  URINE CULTURE     Status: None   Collection Time    12/10/13  1:17 AM      Result Value Ref Range Status   Specimen Description URINE, CLEAN CATCH   Final   Special Requests NONE   Final   Culture  Setup Time     Final   Value: 12/10/2013 01:32     Performed at [facility]     Final   Value: NO GROWTH     Performed at [facility]   Culture     Final   Value: NO GROWTH     Performed at [facility]   Report Status 12/10/2013 FINAL   Final  CULTURE, BLOOD (ROUTINE X 2)     Status: None   Collection Time    12/10/13 10:00 PM      Result Value Ref Range Status   Specimen Description BLOOD LEFT HAND   Final   Special Requests BOTTLES DRAWN AEROBIC AND ANAEROBIC 5CC   Final   Culture  Setup Time     Final   Value: 12/11/2013 04:11     Performed at [facility]   Culture     Final   Value:        BLOOD CULTURE RECEIVED NO GROWTH TO DATE CULTURE WILL BE HELD FOR 5 DAYS BEFORE ISSUING A FINAL NEGATIVE REPORT     Performed at [facility]   Report Status PENDING   Incomplete  CULTURE, BLOOD (ROUTINE X 2)     Status: None   Collection Time    12/10/13 10:05 PM      Result Value Ref Range Status   Specimen Description BLOOD LEFT ARM   Final   Special Requests BOTTLES DRAWN AEROBIC AND ANAEROBIC 5CC   Final   Culture  Setup Time     Final   Value: 12/11/2013 04:11     Performed at [facility]   Culture     Final   Value:        BLOOD CULTURE RECEIVED NO GROWTH TO DATE CULTURE WILL BE  HELD FOR 5 DAYS BEFORE ISSUING A FINAL NEGATIVE REPORT     Performed at [facility]   Report Status PENDING   Incomplete     Labs: Basic Metabolic Panel:  Recent Labs Lab 12/09/13 1935 12/10/13 0420 12/11/13 0425  NA 136*  --  140  K 3.5*  --  3.6*  CL 97  --  106  CO2 19  --  22  GLUCOSE 292*  --  85  BUN 15  --  13  CREATININE 1.06 0.92 0.90  CALCIUM 9.5  --  8.9   Liver Function Tests:  Recent Labs Lab 12/09/13 1935  AST 28  ALT 12  ALKPHOS 85  BILITOT 0.7  PROT 7.5  ALBUMIN 3.1*  No results found for this basename: LIPASE, AMYLASE,  in the last 168 hours No results found for this basename: AMMONIA,  in the last 168 hours CBC:  Recent Labs Lab 12/09/13 1935 12/10/13 0420 12/11/13 0425  WBC 12.9* 10.8* 7.9  NEUTROABS 11.2*  --   --   HGB 11.0* 10.1* 9.3*  HCT 33.7* 30.8* 28.2*  MCV 83.8 84.2 83.2  PLT 185 172 170   Cardiac Enzymes:  Recent Labs Lab 12/10/13 0420  CKTOTAL 495*  TROPONINI <0.30   BNP: BNP (last 3 results) No results found for this basename: PROBNP,  in the last 8760 hours CBG:  Recent Labs Lab 12/12/13 1646 12/12/13 2133 12/12/13 2222 12/13/13 0013 12/13/13 0556  GLUCAP 133* 78 122* 167* 88       Signed:  Charlynne Cousins  [facility] 12/13/2013, 10:29 AM

## 2013-12-14 ENCOUNTER — Ambulatory Visit: Payer: Medicare Other | Admitting: Physical Therapy

## 2013-12-16 LAB — CULTURE, BLOOD (ROUTINE X 2)
Culture: NO GROWTH
Culture: NO GROWTH

## 2013-12-17 ENCOUNTER — Ambulatory Visit: Payer: Medicare Other | Admitting: Physical Therapy

## 2013-12-17 LAB — CULTURE, BLOOD (ROUTINE X 2)
Culture: NO GROWTH
Culture: NO GROWTH

## 2013-12-19 ENCOUNTER — Ambulatory Visit: Payer: Medicare Other | Admitting: Physical Therapy

## 2013-12-19 ENCOUNTER — Other Ambulatory Visit: Payer: Self-pay | Admitting: Family Medicine

## 2013-12-20 ENCOUNTER — Other Ambulatory Visit: Payer: Self-pay | Admitting: Family Medicine

## 2013-12-20 MED ORDER — GLUCOSE BLOOD VI STRP: ORAL_STRIP | Status: DC

## 2013-12-20 NOTE — Telephone Encounter (Signed)
Refill appropriate and filled per protocol. 

## 2013-12-23 ENCOUNTER — Other Ambulatory Visit: Payer: Self-pay | Admitting: Family Medicine

## 2013-12-25 ENCOUNTER — Ambulatory Visit: Payer: Medicare Other | Admitting: Physical Therapy

## 2013-12-27 ENCOUNTER — Ambulatory Visit: Payer: Medicare Other | Admitting: Physical Therapy

## 2013-12-27 ENCOUNTER — Other Ambulatory Visit: Payer: PRIVATE HEALTH INSURANCE

## 2013-12-27 DIAGNOSIS — E781 Pure hyperglyceridemia: Secondary | ICD-10-CM

## 2013-12-27 DIAGNOSIS — Z79899 Other long term (current) drug therapy: Secondary | ICD-10-CM

## 2013-12-27 LAB — HEPATIC FUNCTION PANEL
ALT: 11 U/L (ref 0–35)
AST: 20 U/L (ref 0–37)
Albumin: 3.7 g/dL (ref 3.5–5.2)
Alkaline Phosphatase: 80 U/L (ref 39–117)
Bilirubin, Direct: <0.1 mg/dL (ref 0.0–0.3)
Total Bilirubin: 0.4 mg/dL (ref 0.2–1.2)
Total Protein: 7.1 g/dL (ref 6.0–8.3)

## 2013-12-27 LAB — LIPID PANEL
Cholesterol: 162 mg/dL (ref 0–200)
HDL: 34 mg/dL — ABNORMAL LOW (ref >39)
LDL Cholesterol: 56 mg/dL (ref 0–99)
Total CHOL/HDL Ratio: 4.8 Ratio
Triglycerides: 359 mg/dL — ABNORMAL HIGH (ref <150)
VLDL: 72 mg/dL — ABNORMAL HIGH (ref 0–40)

## 2014-01-03 ENCOUNTER — Ambulatory Visit (INDEPENDENT_AMBULATORY_CARE_PROVIDER_SITE_OTHER): Payer: PRIVATE HEALTH INSURANCE | Admitting: Family Medicine

## 2014-01-03 ENCOUNTER — Encounter: Payer: Self-pay | Admitting: Family Medicine

## 2014-01-03 VITALS — BP 150/68 | HR 60 | Temp 98.6°F | Resp 14 | Ht 64.0 in | Wt 155.0 lb

## 2014-01-03 DIAGNOSIS — Z09 Encounter for follow-up examination after completed treatment for conditions other than malignant neoplasm: Secondary | ICD-10-CM

## 2014-01-03 DIAGNOSIS — R0989 Other specified symptoms and signs involving the circulatory and respiratory systems: Secondary | ICD-10-CM

## 2014-01-03 DIAGNOSIS — J189 Pneumonia, unspecified organism: Secondary | ICD-10-CM

## 2014-01-03 DIAGNOSIS — D649 Anemia, unspecified: Secondary | ICD-10-CM

## 2014-01-03 LAB — CBC WITH DIFFERENTIAL/PLATELET
Basophils Absolute: 0.1 10*3/uL (ref 0.0–0.1)
Basophils Relative: 1 % (ref 0–1)
Eosinophils Absolute: 0.3 10*3/uL (ref 0.0–0.7)
Eosinophils Relative: 4 % (ref 0–5)
HCT: 32.2 % — ABNORMAL LOW (ref 36.0–46.0)
Hemoglobin: 10.7 g/dL — ABNORMAL LOW (ref 12.0–15.0)
Lymphocytes Relative: 41 % (ref 12–46)
Lymphs Abs: 2.6 10*3/uL (ref 0.7–4.0)
MCH: 26.8 pg (ref 26.0–34.0)
MCHC: 33.2 g/dL (ref 30.0–36.0)
MCV: 80.7 fL (ref 78.0–100.0)
Monocytes Absolute: 0.8 10*3/uL (ref 0.1–1.0)
Monocytes Relative: 12 % (ref 3–12)
Neutro Abs: 2.6 10*3/uL (ref 1.7–7.7)
Neutrophils Relative %: 42 % — ABNORMAL LOW (ref 43–77)
Platelets: 218 10*3/uL (ref 150–400)
RBC: 3.99 MIL/uL (ref 3.87–5.11)
RDW: 13.3 % (ref 11.5–15.5)
WBC: 6.3 10*3/uL (ref 4.0–10.5)

## 2014-01-03 NOTE — Progress Notes (Signed)
Subjective:    Patient ID: Rebecca Perkins, female    DOB: Dec 06, 1928, 80 y.o.   MRN: [id number]  HPI Patient was recently admitted to the hospital.  I have copied relevant portions of her discharge summary and included them below for my reference: Admit date: 12/09/2013  Discharge date: 12/13/2013  Time spent: 40 minutes  Recommendations for Outpatient Follow-up:  1. Followup with PCP to evaluate her respirations, and repeat chest x-ray in 6 weeks. 2. Followup of the [facility] patient has a mild anemia with a normal ferritin and iron less than 10 her MCV is unremarkable. She has refused colonoscopies in the past. Discharge Diagnoses:  Principal Problem:  Sepsis  Active Problems:  Hypertension  Hypertriglyceridemia  Diabetes mellitus type II, uncontrolled  Pneumonia  Normocytic anemia  Discharge Condition: stable  Diet recommendation: heart healthy  Filed Weights    12/11/13 0513  12/12/13 0453  12/13/13 0552   Weight:  71.889 kg (158 lb 7.8 oz)  73.8 kg (162 lb 11.2 oz)  73.664 kg (162 lb 6.4 oz)   History of present illness:  80 y.o. female with history of diabetes mellitus, hypertension, hyperlipidemia, reflux sympathetic dystrophic presents to the ER because of fever over the last 24 hours with weakness and fatigue over the last 2-3 days. Patient also has been having nonproductive cough. Denies any chest pain nausea vomiting abdominal pain diarrhea. In the ER chest x-ray shows infiltrates concerning for pneumonia. Patient also has been noticed to have some increasing edema of the left lower extremity. Denies any recent travel or sick contacts. Patient has been admitted for pneumonia. Patient also noticed that patient pressure has been running high. Patient states she has been taking her medications as advised. Initially when patient came patient was tachycardic and was given 2 L of normal saline bolus after which patient's heart rate improved. Patient's blood work showed mild  leukocytosis.  Hospital Course:  Sepsis due to Community acquire pneumonia:  - Present on admission, evidenced by a respiratory rate of 30, temperature 102.6, heart rate of 102, white count of 12,900  - Chest x-ray on admission showing infiltrate in the left mid lung consistent with acute pneumonia  - Continue empiric IV antibiotic azithromycin and ceftriaxone, de-esclate to levaquin.  - Blood cultures remain negative  - She will continue Levaquin for 4 additional days.  Normocytic anemia:  - Anemia panel done shows a normal RDW, B12 and RBC folate.  - Her ferritin was greater than 1000 probably reactive.  - Iron level is less than 10. She was started on ferrous sulfate.  - She has never had a colonoscopy. She does brought no melanotic stools.  - She relates that she is afraid of getting a colonoscopy. She'll have to think about it. I have given her number of gastroenterology for an appointment. We will go ahead and schedule her a new patient appointment with gastroenterology.  Acute respiratory failure  - Evidence by respiratory of 30 on presentation  - Likely secondary to community acquire pneumonia  - Provide supplemental oxygen and empiric IV antibiotic therapy  Type 2 diabetes mellitus  - Blood sugars controlled  - Continue Lantus 60 units subcutaneous daily and SSI.  Hypertension  - No changes were made.  Procedures:  CXR  She is here today for follow up.  She still feels extremely tired.  She is now able to go to her mailbox and back. Her oxygen saturations remain 93-95% with ambulation. She is now requiring  oxygen and laid around her home. However she still feels extremely fatigued. Her hemoglobin nadir at 9.3. She is scheduled to see gastroenterology in July.  She has been compliant with taking iron supplementation. She denies any shortness of breath. She denies any chest pain. She denies any hemoptysis. She recently had a LFTs and fasting lipid panel performed which showed a  drastic reduction in her triglycerides to less than 400. Her liver function tests were normal while on gemfibrozil.  Patient states that her blood pressure home has been ranging 1:15 to 120 over 60s. It is more elevated today in the office at 150/68. However she felt lightheaded and dizzy upon standing. Her anemia I am hesitant to want to increase her blood pressure medication at this time. Past Medical History  Diagnosis Date  . Diabetes mellitus   . Hypertension   . Dyslipidemia   . Varicose veins   . Migraines   . GERD (gastroesophageal reflux disease)   . RSD (reflex sympathetic dystrophy) 2007    R wrist/hand following fx  . Anemia    Past Surgical History  Procedure Laterality Date  . Umbilical hernia repair    . Breast cyst excision    . Tubal ligation    . Cholecystectomy  1989  . Appendectomy  1966  . Abdominal hysterectomy  1988  . Several benign cyst removed      last 1 in 1972  . Cataract extraction  10/2009   Current Outpatient Prescriptions on File Prior to Visit  Medication Sig Dispense Refill  . amLODipine (NORVASC) 5 MG tablet Take 5 mg by mouth daily.      Marland Kitchen aspirin EC 81 MG tablet Take 81 mg by mouth daily.      . B-D ULTRAFINE III SHORT PEN 31G X 8 MM MISC USE AS DIRECTED  100 each  11  . Calcium Carbonate-Vitamin D (CALTRATE 600+D PO) Take 1 tablet by mouth 2 (two) times daily.      . ferrous sulfate 325 (65 FE) MG EC tablet Take 1 tablet (325 mg total) by mouth 3 (three) times daily with meals.  90 tablet  3  . fish oil-omega-3 fatty acids 1000 MG capsule Take 2 g by mouth 2 (two) times daily.        . furosemide (LASIX) 40 MG tablet TAKE 1 TABLET BY MOUTH EVERY DAY  30 tablet  0  . gemfibrozil (LOPID) 600 MG tablet Take 1 tablet (600 mg total) by mouth 2 (two) times daily before a meal.  60 tablet  5  . glucose blood (FREESTYLE TEST STRIPS) test strip Check BS QAM & QHS & TID before each meal - Dx- 250.00  200 each  11  . insulin aspart (NOVOLOG) 100 UNIT/ML  injection Inject 5U SQ with breakfast, 7U SQ with lunch and 7U SQ with dinner.  3 vial  PRN  . insulin glargine (LANTUS) 100 UNIT/ML injection Inject 60 Units into the skin daily with breakfast. Pt uses vials      . losartan (COZAAR) 100 MG tablet Take 100 mg by mouth daily.      . metoprolol (LOPRESSOR) 50 MG tablet Take 25 mg by mouth 2 (two) times daily.      . Multiple Vitamin (MULTIVITAMIN) tablet Take 1 tablet by mouth daily.        Marland Kitchen omeprazole (PRILOSEC) 20 MG capsule Take 20 mg by mouth daily.      . potassium chloride SA (K-DUR,KLOR-CON) 20 MEQ tablet TAKE  1 TABLET BY MOUTH TWICE DAILY  180 tablet  0  . Pyridoxine HCl (VITAMIN B-6) 100 MG tablet Take 100 mg by mouth daily.         No current facility-administered medications on file prior to visit.   Allergies  Allergen Reactions  . Lipitor [Atorvastatin Calcium] Diarrhea  . Niacin And Related Hives  . Statins Diarrhea  . Macrobid [Nitrofurantoin Macrocrystal] Other (See Comments)    unknown   History   Social History  . Marital Status: Married    Spouse Name: N/A    Number of Children: N/A  . Years of Education: N/A   Occupational History  . Not on file.   Social History Main Topics  . Smoking status: Never Smoker   . Smokeless tobacco: Never Used  . Alcohol Use: No  . Drug Use: No  . Sexual Activity: Not on file   Other Topics Concern  . 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      Objective:   Physical Exam  Vitals reviewed. Constitutional: She appears well-developed and well-nourished. No distress.  Neck: Neck supple. No JVD present. No thyromegaly present.  Cardiovascular: Normal rate, regular rhythm and normal heart sounds.   Pulses:      Carotid pulses are 0 on the right side, and 1+ on the  left side. Pulmonary/Chest: Effort normal and breath sounds normal. No respiratory distress. She has no wheezes. She has no rales.  Abdominal: Soft. Bowel sounds are normal. She exhibits no distension. There is no tenderness. There is no rebound and no guarding.  Musculoskeletal: She exhibits no edema.  Lymphadenopathy:    She has no cervical adenopathy.  Skin: She is not diaphoretic.          Assessment & Plan:  1. Hospital discharge follow-up The patient's pneumonia clinically has resolved. She is slowly returning back to her baseline. She is no longer hypoxic. She denies any fevers or cough or shortness of breath. I do not feel further antibiotics are indicated. I will repeat a chest x-ray to ensure resolution of the pneumonia.  Monitor blood pressure for now and recheck her blood pressure in one week. If blood pressure remains elevated I will increase amlodipine. Satisfied with the triglycerides will not make any changes to gemfibrozil. I will recheck her blood sugars in one week.  2. Anemia Patient's anemia is most likely contributing to her severe fatigue. I will repeat a CBC to see if it is improving on iron replacement. If it is worsening I will expedite her GI referral and discontinue her aspirin if it is worsening. - CBC with Differential  3. CAP (community acquired pneumonia) Clinically resolved repeat chest x-ray. - DG Chest 2 View; Future  4. Left carotid bruit - Carotid duplex; Future

## 2014-01-09 ENCOUNTER — Ambulatory Visit
Admission: RE | Admit: 2014-01-09 | Discharge: 2014-01-09 | Disposition: A | Discharge status: Home / Self Care | Payer: Medicare Other | Source: Ambulatory Visit | Attending: Family Medicine | Admitting: Family Medicine

## 2014-01-09 ENCOUNTER — Ambulatory Visit (HOSPITAL_COMMUNITY): Payer: Medicare Other | Attending: Family Medicine | Admitting: Cardiology

## 2014-01-09 DIAGNOSIS — I10 Essential (primary) hypertension: Secondary | ICD-10-CM | POA: Insufficient documentation

## 2014-01-09 DIAGNOSIS — J189 Pneumonia, unspecified organism: Secondary | ICD-10-CM

## 2014-01-09 DIAGNOSIS — E785 Hyperlipidemia, unspecified: Secondary | ICD-10-CM | POA: Insufficient documentation

## 2014-01-09 DIAGNOSIS — R42 Dizziness and giddiness: Secondary | ICD-10-CM

## 2014-01-09 DIAGNOSIS — R0989 Other specified symptoms and signs involving the circulatory and respiratory systems: Secondary | ICD-10-CM | POA: Insufficient documentation

## 2014-01-09 DIAGNOSIS — I658 Occlusion and stenosis of other precerebral arteries: Secondary | ICD-10-CM | POA: Insufficient documentation

## 2014-01-09 DIAGNOSIS — I6529 Occlusion and stenosis of unspecified carotid artery: Secondary | ICD-10-CM | POA: Insufficient documentation

## 2014-01-09 DIAGNOSIS — E119 Type 2 diabetes mellitus without complications: Secondary | ICD-10-CM | POA: Insufficient documentation

## 2014-01-09 NOTE — Progress Notes (Signed)
Carotid duplex completed 

## 2014-01-11 ENCOUNTER — Telehealth: Payer: Self-pay | Admitting: Family Medicine

## 2014-01-11 DIAGNOSIS — J189 Pneumonia, unspecified organism: Secondary | ICD-10-CM

## 2014-01-11 NOTE — Telephone Encounter (Signed)
Pt aware of all test results.  Is taking 81 mg ASA daily.  No c/o fever or cough.  Order for future CXR entered and pt aware need to be repeated in 2 weeks

## 2014-01-11 NOTE — Telephone Encounter (Signed)
Message copied by Olena Mater on Fri Jan 11, 2014 12:59 PM ------      Message from: Jenna Luo      Created: Thu Jan 10, 2014  7:19 AM       Bilateral 40-59% ICA stenosis.  Manage risk factors (BP, cholesterol, sugars) and recheck in 1 year.  No surgery at this time.  Make sure she is on asa 81 mg poqday. ------

## 2014-01-11 NOTE — Telephone Encounter (Signed)
Message copied by Olena Mater on Fri Jan 11, 2014 12:59 PM ------      Message from: Jenna Luo      Created: Thu Jan 10, 2014  7:20 AM       PNA better but still persistent.  Recheck CXR in 2 weeks.  Abx ONLY if symptomatic (cough, fever).  Is she having symptoms? ------

## 2014-01-14 ENCOUNTER — Other Ambulatory Visit: Payer: Self-pay | Admitting: Family Medicine

## 2014-01-21 ENCOUNTER — Other Ambulatory Visit: Payer: Self-pay | Admitting: Family Medicine

## 2014-01-23 ENCOUNTER — Ambulatory Visit
Admission: RE | Admit: 2014-01-23 | Discharge: 2014-01-23 | Disposition: A | Discharge status: Home / Self Care | Payer: Medicare Other | Source: Ambulatory Visit | Attending: Family Medicine | Admitting: Family Medicine

## 2014-01-23 DIAGNOSIS — J189 Pneumonia, unspecified organism: Secondary | ICD-10-CM

## 2014-01-31 ENCOUNTER — Encounter: Payer: Self-pay | Admitting: Internal Medicine

## 2014-02-07 ENCOUNTER — Other Ambulatory Visit (INDEPENDENT_AMBULATORY_CARE_PROVIDER_SITE_OTHER): Payer: Medicare Other

## 2014-02-07 ENCOUNTER — Encounter: Payer: Self-pay | Admitting: Internal Medicine

## 2014-02-07 ENCOUNTER — Ambulatory Visit (INDEPENDENT_AMBULATORY_CARE_PROVIDER_SITE_OTHER): Payer: Medicare Other | Admitting: Internal Medicine

## 2014-02-07 VITALS — BP 130/62 | HR 72 | Ht 64.0 in | Wt 157.8 lb

## 2014-02-07 DIAGNOSIS — D509 Iron deficiency anemia, unspecified: Secondary | ICD-10-CM

## 2014-02-07 DIAGNOSIS — Z1211 Encounter for screening for malignant neoplasm of colon: Secondary | ICD-10-CM

## 2014-02-07 LAB — IBC PANEL
Iron: 108 ug/dL (ref 42–145)
Saturation Ratios: 26 % (ref 20.0–50.0)
Transferrin: 296.5 mg/dL (ref 212.0–360.0)

## 2014-02-07 LAB — CBC
HCT: 36.1 % (ref 36.0–46.0)
Hemoglobin: 12.1 g/dL (ref 12.0–15.0)
MCHC: 33.5 g/dL (ref 30.0–36.0)
MCV: 82.3 fl (ref 78.0–100.0)
Platelets: 234 10*3/uL (ref 150.0–400.0)
RBC: 4.39 Mil/uL (ref 3.87–5.11)
RDW: 13.1 % (ref 11.5–15.5)
WBC: 6.2 10*3/uL (ref 4.0–10.5)

## 2014-02-07 LAB — FERRITIN: Ferritin: 226.3 ng/mL (ref 10.0–291.0)

## 2014-02-07 NOTE — Patient Instructions (Signed)
You have signed a form for Cologuard. [facility] will contact you to schedule the delivery of your kit.  Your physician has requested that you go to the basement for the following lab work before leaving today: Iron studies

## 2014-02-07 NOTE — Progress Notes (Signed)
Patient ID: Rebecca Perkins, female   DOB: 12-02-1928, 80 y.o.   MRN: [id number] HPI: Rebecca Perkins is an 80 yo female with PMH of GERD, hypertension, hyperlipidemia, diabetes, RSD, recent hospitalization for pneumonia found to have mixed picture anemia with component of iron deficiency who is seen in consultation at the request of Dr. Dennard Schaumann for iron deficiency anemia. She is here today with her daughter-in-law. She was admitted to the hospital in May 2015 for community-acquired pneumonia and on presentation her hemoglobin was noted to be 11.0 with a normal MCV. After hydration her hemoglobin fell to 9 g/dL.  Iron studies were performed which revealed an elevated ferritin felt to be an acute phase reactant and an iron < 10.  She was treated with antibiotics and discharged on oral iron. She followed up with primary care who recommended she come to GI to consider colonoscopy. She has never had a screening colonoscopy. She reports she always avoided it because she didn't want to have it performed. She also has never performed FOBT testing. She is feeling better. Her primary complaint with pneumonia with dyspnea and this has improved. He also was having some fatigue and tired feeling but this is overall better as well. She denies change in bowel habit or abdominal pain. She reports she's had chronic long-standing and lifelong loose stools, worse after eating. She denies blood in her stool or melena. Stools have darkened with oral iron replacement. She reports no weight loss. No change in appetite. No nausea or vomiting. No trouble swallowing. Heartburn is very well-controlled when she takes her PPI, omeprazole 20 mg daily. No family history of colon polyps or cancer to her knowledge. Her sister has MDS.  Past Medical History  Diagnosis Date  . Diabetes mellitus   . Hypertension   . Dyslipidemia   . Varicose veins   . Migraines   . GERD (gastroesophageal reflux disease)   . RSD (reflex sympathetic dystrophy)  2007    R wrist/hand following fx  . Anemia     Past Surgical History  Procedure Laterality Date  . Umbilical hernia repair    . Breast cyst excision    . Tubal ligation    . Cholecystectomy  1989  . Appendectomy  1966  . Abdominal hysterectomy  1988  . Several benign cyst removed      last 1 in 1972  . Cataract extraction  10/2009    Outpatient Prescriptions Prior to Visit  Medication Sig Dispense Refill  . amLODipine (NORVASC) 5 MG tablet TAKE 1 TABLET BY MOUTH EVERY DAY.  30 tablet  11  . aspirin EC 81 MG tablet Take 81 mg by mouth daily.      . B-D ULTRAFINE III SHORT PEN 31G X 8 MM MISC USE AS DIRECTED  100 each  11  . Calcium Carbonate-Vitamin D (CALTRATE 600+D PO) Take 1 tablet by mouth 2 (two) times daily.      . ferrous sulfate 325 (65 FE) MG EC tablet Take 1 tablet (325 mg total) by mouth 3 (three) times daily with meals.  90 tablet  3  . fish oil-omega-3 fatty acids 1000 MG capsule Take 2 g by mouth 2 (two) times daily.        . furosemide (LASIX) 40 MG tablet TAKE 1 TABLET BY MOUTH EVERY DAY  30 tablet  5  . gemfibrozil (LOPID) 600 MG tablet Take 1 tablet (600 mg total) by mouth 2 (two) times daily before a meal.  60 tablet  5  . glucose blood (FREESTYLE TEST STRIPS) test strip Check BS QAM & QHS & TID before each meal - Dx- 250.00  200 each  11  . insulin aspart (NOVOLOG) 100 UNIT/ML injection Inject 5U SQ with breakfast, 7U SQ with lunch and 7U SQ with dinner.  3 vial  PRN  . insulin glargine (LANTUS) 100 UNIT/ML injection Inject 60 Units into the skin daily with breakfast. Pt uses vials      . losartan (COZAAR) 100 MG tablet Take 100 mg by mouth daily.      . metoprolol (LOPRESSOR) 50 MG tablet Take 25 mg by mouth 2 (two) times daily.      . Multiple Vitamin (MULTIVITAMIN) tablet Take 1 tablet by mouth daily.        Marland Kitchen omeprazole (PRILOSEC) 20 MG capsule Take 20 mg by mouth daily.      . potassium chloride SA (K-DUR,KLOR-CON) 20 MEQ tablet TAKE 1 TABLET BY MOUTH TWICE  DAILY  180 tablet  0  . Pyridoxine HCl (VITAMIN B-6) 100 MG tablet Take 100 mg by mouth daily.        Marland Kitchen amLODipine (NORVASC) 5 MG tablet Take 5 mg by mouth daily.       No facility-administered medications prior to visit.    Allergies  Allergen Reactions  . Lipitor [Atorvastatin Calcium] Diarrhea  . Niacin And Related Hives  . Statins Diarrhea  . Macrobid [Nitrofurantoin Macrocrystal] Other (See Comments)    unknown    Family History  Problem Relation Age of Onset  . Stroke Mother 58  . Hypertension Mother   . Clotting disorder Father   . Heart attack Father   . Arrhythmia Sister   . Stroke Brother     History  Substance Use Topics  . Smoking status: Never Smoker   . Smokeless tobacco: Never Used  . Alcohol Use: No    ROS: As per history of present illness, otherwise negative  BP 130/62  Pulse 72  Ht 5\' 4"  (1.626 m)  Wt 157 lb 12.8 oz (71.578 kg)  BMI 27.07 kg/m2 Constitutional: Well-developed and well-nourished. No distress. HEENT: Normocephalic and atraumatic. Oropharynx is clear and moist. No oropharyngeal exudate. Conjunctivae are normal.  No scleral icterus. Neck: Neck supple. Trachea midline. Cardiovascular: Normal rate, regular rhythm and intact distal pulses.  Pulmonary/chest: Effort normal and breath sounds normal. No wheezing, rales or rhonchi. Abdominal: Soft, nontender, nondistended. Bowel sounds active throughout.  Extremities: no clubbing, cyanosis, or edema Lymphadenopathy: No cervical adenopathy noted. Neurological: Alert and oriented to person place and time. Skin: Skin is warm and dry. No rashes noted. Psychiatric: Normal mood and affect. Behavior is normal.  RELEVANT LABS AND IMAGING: CBC    Component Value Date/Time   WBC 6.3 01/03/2014 0912   RBC 3.99 01/03/2014 0912   RBC 3.50* 12/10/2013 0505   HGB 10.7* 01/03/2014 0912   HCT 32.2* 01/03/2014 0912   PLT 218 01/03/2014 0912   MCV 80.7 01/03/2014 0912   MCH 26.8 01/03/2014 0912   MCHC 33.2  01/03/2014 0912   RDW 13.3 01/03/2014 0912   LYMPHSABS 2.6 01/03/2014 0912   MONOABS 0.8 01/03/2014 0912   EOSABS 0.3 01/03/2014 0912   BASOSABS 0.1 01/03/2014 0912    CMP     Component Value Date/Time   NA 140 12/11/2013 0425   K 3.6* 12/11/2013 0425   CL 106 12/11/2013 0425   CO2 22 12/11/2013 0425   GLUCOSE 85 12/11/2013 0425   BUN 13 12/11/2013  0425   CREATININE 0.90 12/11/2013 0425   CREATININE 1.02 11/20/2013 0836   CALCIUM 8.9 12/11/2013 0425   PROT 7.1 12/27/2013 0844   ALBUMIN 3.7 12/27/2013 0844   AST 20 12/27/2013 0844   ALT 11 12/27/2013 0844   ALKPHOS 80 12/27/2013 0844   BILITOT 0.4 12/27/2013 0844   GFRNONAA 58* 12/11/2013 0425   GFRNONAA 51* 11/20/2013 0836   GFRAA 67* 12/11/2013 0425   GFRAA 59* 11/20/2013 0836   Iron/TIBC/Ferritin/ %Sat    Component Value Date/Time   IRON <10* 12/11/2013 1400   TIBC Not calculated due to Iron <10. 12/11/2013 1400   FERRITIN 902* 12/10/2013 0505   IRONPCTSAT Not calculated due to Iron <10. 12/11/2013 1400     ASSESSMENT/PLAN:  80 yo female with PMH of GERD, hypertension, hyperlipidemia, diabetes, RSD, recent hospitalization for pneumonia found to have mixed picture anemia with component of iron deficiency who is seen in consultation at the request of Dr. Dennard Schaumann for iron deficiency anemia.   1.  IDA -- she is without GI complaint, but has never had colonoscopy. She remains very reluctant to undergo a procedure at this time and we spent a great deal of time discussing the workup of iron deficiency anemia, the GI procedures often involved along with the risks and benefits.  The differential includes an occult malignancy, bleeding polyp, angiodysplastic lesions anywhere throughout the gut, or non-GI blood loss anemia.  My recommendation is for colonoscopy, but she remains hesitant. We discussed other options including Cologuard which is a new DNA-based test for colon polyps and cancer. She understands that this test is positive she would need a colonoscopy. We  did discuss observation and close monitoring of hemoglobin and iron studies along with oral iron supplementation as a option, though not the ideal option.  After discussion with the patient and her daughter-in-law, the patient decided to pursue Cologuard testing.  She understands the colonoscopy will be recommended if this is positive and agrees to proceed to colonoscopy in that event.  In the interim I will recheck iron studies and CBC today. I have recommended that she continue oral iron supplementation 325 mg 3 times daily. She is happy with this plan.

## 2014-02-22 ENCOUNTER — Encounter: Payer: Self-pay | Admitting: Family Medicine

## 2014-02-22 ENCOUNTER — Ambulatory Visit (INDEPENDENT_AMBULATORY_CARE_PROVIDER_SITE_OTHER): Payer: PRIVATE HEALTH INSURANCE | Admitting: Family Medicine

## 2014-02-22 VITALS — BP 120/68 | HR 64 | Temp 97.8°F | Resp 14 | Ht 64.0 in | Wt 155.0 lb

## 2014-02-22 DIAGNOSIS — J209 Acute bronchitis, unspecified: Secondary | ICD-10-CM

## 2014-02-22 DIAGNOSIS — J208 Acute bronchitis due to other specified organisms: Secondary | ICD-10-CM

## 2014-02-22 MED ORDER — AZITHROMYCIN 250 MG PO TABS: ORAL_TABLET | ORAL | Status: DC

## 2014-02-22 NOTE — Progress Notes (Signed)
Subjective:    Patient ID: Rebecca Perkins, female    DOB: 08-May-1929, 80 y.o.   MRN: [id number]  HPI Patient has developed a constant hacking cough over the last 2-3 days. She denies any purulent sputum however she can hear mucus and congestion in the left side of her chest whenever she lies down. She also reports shortness of breath and fatigue. She recently was admitted for pneumonia and sepsis due to pneumonia. She is concerned that the symptoms could be starting again. On examination today she does have faint left basilar is. Otherwise breath sounds are clear. She does not appear to be acutely ill. She denies any hemoptysis or chest pain. She denies any fevers. Past Medical History  Diagnosis Date  . Diabetes mellitus   . Hypertension   . Dyslipidemia   . Varicose veins   . Migraines   . GERD (gastroesophageal reflux disease)   . RSD (reflex sympathetic dystrophy) 2007    R wrist/hand following fx  . Anemia    Current Outpatient Prescriptions on File Prior to Visit  Medication Sig Dispense Refill  . amLODipine (NORVASC) 5 MG tablet TAKE 1 TABLET BY MOUTH EVERY DAY.  30 tablet  11  . aspirin EC 81 MG tablet Take 81 mg by mouth daily.      . B-D ULTRAFINE III SHORT PEN 31G X 8 MM MISC USE AS DIRECTED  100 each  11  . Calcium Carbonate-Vitamin D (CALTRATE 600+D PO) Take 1 tablet by mouth 2 (two) times daily.      . ferrous sulfate 325 (65 FE) MG EC tablet Take 1 tablet (325 mg total) by mouth 3 (three) times daily with meals.  90 tablet  3  . fish oil-omega-3 fatty acids 1000 MG capsule Take 2 g by mouth 2 (two) times daily.        . furosemide (LASIX) 40 MG tablet TAKE 1 TABLET BY MOUTH EVERY DAY  30 tablet  5  . gemfibrozil (LOPID) 600 MG tablet Take 1 tablet (600 mg total) by mouth 2 (two) times daily before a meal.  60 tablet  5  . glucose blood (FREESTYLE TEST STRIPS) test strip Check BS QAM & QHS & TID before each meal - Dx- 250.00  200 each  11  . insulin aspart (NOVOLOG)  100 UNIT/ML injection Inject 5U SQ with breakfast, 7U SQ with lunch and 7U SQ with dinner.  3 vial  PRN  . insulin glargine (LANTUS) 100 UNIT/ML injection Inject 60 Units into the skin daily with breakfast. Pt uses vials      . losartan (COZAAR) 100 MG tablet Take 100 mg by mouth daily.      . metoprolol (LOPRESSOR) 50 MG tablet Take 25 mg by mouth 2 (two) times daily.      . Multiple Vitamin (MULTIVITAMIN) tablet Take 1 tablet by mouth daily.        Marland Kitchen omega-3 acid ethyl esters (LOVAZA) 1 G capsule       . omeprazole (PRILOSEC) 20 MG capsule Take 20 mg by mouth daily.      . potassium chloride SA (K-DUR,KLOR-CON) 20 MEQ tablet TAKE 1 TABLET BY MOUTH TWICE DAILY  180 tablet  0  . Pyridoxine HCl (VITAMIN B-6) 100 MG tablet Take 100 mg by mouth daily.         No current facility-administered medications on file prior to visit.   Allergies  Allergen Reactions  . Lipitor [Atorvastatin Calcium] Diarrhea  . Niacin  And Related Hives  . Statins Diarrhea  . Macrobid [Nitrofurantoin Macrocrystal] Other (See Comments)    unknown   History   Social History  . Marital Status: Married    Spouse Name: N/A    Number of Children: 3  . Years of Education: N/A   Occupational History  . Retired    Social History Main Topics  . Smoking status: Never Smoker   . Smokeless tobacco: Never Used  . Alcohol Use: No  . Drug Use: No  . Sexual Activity: Not on file   Other Topics Concern  . 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      Objective:   Physical Exam  Vitals reviewed. Cardiovascular: Normal rate and regular rhythm.   Pulmonary/Chest: Effort normal. No respiratory distress. She has no wheezes. She has rales. She exhibits no tenderness.  Abdominal: Soft. Bowel sounds are normal.           Assessment & Plan:  1. Acute bronchitis due to other specified organisms - azithromycin (ZITHROMAX) 250 MG tablet; 2 tabs poqday1, 1 tab poqday 2-5  Dispense: 6 tablet; Refill: 0 Begin mucinex 600 mg pobid for congestion and recheck Monday or sooner if worse.

## 2014-02-25 ENCOUNTER — Other Ambulatory Visit: Payer: Self-pay | Admitting: Family Medicine

## 2014-02-26 NOTE — Telephone Encounter (Signed)
Medication refilled per protocol. 

## 2014-02-27 LAB — COLOGUARD: Cologuard: NEGATIVE

## 2014-02-28 ENCOUNTER — Telehealth: Payer: Self-pay | Admitting: Family Medicine

## 2014-02-28 NOTE — Telephone Encounter (Signed)
good

## 2014-02-28 NOTE — Telephone Encounter (Signed)
FYI

## 2014-02-28 NOTE — Telephone Encounter (Signed)
CALLING TO LET  YOU KNOW THAT SHE IS BETTER NO RATTLE IN HER COUGH ANYMORE STILL HAS A COUGH BUT NOT AS BAD  IF WE NEED TO [place] HER [phone]

## 2014-03-12 ENCOUNTER — Telehealth: Payer: Self-pay | Admitting: *Deleted

## 2014-03-12 NOTE — Telephone Encounter (Signed)
I have advised patient that per Dr Hilarie Fredrickson, her cologuard was negative which means there is a very very low risk of any significant colon polyps. Patient should have labs monitored for iron deficiency by her primary care doctor. She can follow up with [place] as needed. Patient verbalizes understanding of all of the above.

## 2014-03-26 ENCOUNTER — Encounter: Payer: Self-pay | Admitting: Family Medicine

## 2014-04-02 ENCOUNTER — Other Ambulatory Visit: Payer: Self-pay | Admitting: Family Medicine

## 2014-04-11 ENCOUNTER — Other Ambulatory Visit: Payer: Self-pay | Admitting: Family Medicine

## 2014-04-11 NOTE — Telephone Encounter (Signed)
Refill appropriate and filled per protocol. 

## 2014-04-25 ENCOUNTER — Ambulatory Visit (INDEPENDENT_AMBULATORY_CARE_PROVIDER_SITE_OTHER): Payer: PRIVATE HEALTH INSURANCE | Admitting: Family Medicine

## 2014-04-25 ENCOUNTER — Encounter: Payer: Self-pay | Admitting: Family Medicine

## 2014-04-25 VITALS — BP 128/74 | HR 68 | Temp 97.5°F | Resp 14 | Ht 64.17 in | Wt 157.0 lb

## 2014-04-25 DIAGNOSIS — I10 Essential (primary) hypertension: Secondary | ICD-10-CM

## 2014-04-25 DIAGNOSIS — E781 Pure hyperglyceridemia: Secondary | ICD-10-CM

## 2014-04-25 DIAGNOSIS — Z23 Encounter for immunization: Secondary | ICD-10-CM

## 2014-04-25 DIAGNOSIS — E119 Type 2 diabetes mellitus without complications: Secondary | ICD-10-CM

## 2014-04-25 DIAGNOSIS — D509 Iron deficiency anemia, unspecified: Secondary | ICD-10-CM

## 2014-04-25 LAB — CBC WITH DIFFERENTIAL/PLATELET
Basophils Absolute: 0.1 10*3/uL (ref 0.0–0.1)
Basophils Relative: 1 % (ref 0–1)
Eosinophils Absolute: 0.3 10*3/uL (ref 0.0–0.7)
Eosinophils Relative: 6 % — ABNORMAL HIGH (ref 0–5)
HCT: 33.9 % — ABNORMAL LOW (ref 36.0–46.0)
Hemoglobin: 11.5 g/dL — ABNORMAL LOW (ref 12.0–15.0)
Lymphocytes Relative: 45 % (ref 12–46)
Lymphs Abs: 2.6 10*3/uL (ref 0.7–4.0)
MCH: 27.2 pg (ref 26.0–34.0)
MCHC: 33.9 g/dL (ref 30.0–36.0)
MCV: 80.1 fL (ref 78.0–100.0)
Monocytes Absolute: 0.6 10*3/uL (ref 0.1–1.0)
Monocytes Relative: 11 % (ref 3–12)
Neutro Abs: 2.1 10*3/uL (ref 1.7–7.7)
Neutrophils Relative %: 37 % — ABNORMAL LOW (ref 43–77)
Platelets: 216 10*3/uL (ref 150–400)
RBC: 4.23 MIL/uL (ref 3.87–5.11)
RDW: 13.1 % (ref 11.5–15.5)
WBC: 5.8 10*3/uL (ref 4.0–10.5)

## 2014-04-25 LAB — COMPLETE METABOLIC PANEL WITH GFR
ALT: 12 U/L (ref 0–35)
AST: 16 U/L (ref 0–37)
Albumin: 4 g/dL (ref 3.5–5.2)
Alkaline Phosphatase: 89 U/L (ref 39–117)
BUN: 24 mg/dL — ABNORMAL HIGH (ref 6–23)
CO2: 23 mEq/L (ref 19–32)
Calcium: 9.8 mg/dL (ref 8.4–10.5)
Chloride: 105 mEq/L (ref 96–112)
Creat: 1.07 mg/dL (ref 0.50–1.10)
GFR, Est African American: 56 mL/min — ABNORMAL LOW
GFR, Est Non African American: 48 mL/min — ABNORMAL LOW
Glucose, Bld: 153 mg/dL — ABNORMAL HIGH (ref 70–99)
Potassium: 4.2 mEq/L (ref 3.5–5.3)
Sodium: 141 mEq/L (ref 135–145)
Total Bilirubin: 0.4 mg/dL (ref 0.2–1.2)
Total Protein: 7.2 g/dL (ref 6.0–8.3)

## 2014-04-25 LAB — LIPID PANEL
Cholesterol: 197 mg/dL (ref 0–200)
HDL: 28 mg/dL — ABNORMAL LOW (ref >39)
Total CHOL/HDL Ratio: 7 Ratio
Triglycerides: 678 mg/dL — ABNORMAL HIGH (ref <150)

## 2014-04-25 LAB — HEMOGLOBIN A1C
Hgb A1c MFr Bld: 8.7 % — ABNORMAL HIGH (ref <5.7)
Mean Plasma Glucose: 203 mg/dL — ABNORMAL HIGH (ref <117)

## 2014-04-25 NOTE — Addendum Note (Signed)
Addended by: Sheral Flow on: 04/25/2014 09:38 AM   Modules accepted: Orders

## 2014-04-25 NOTE — Progress Notes (Signed)
Subjective:    Patient ID: Rebecca Perkins, female    DOB: 1929-04-24, 80 y.o.   MRN: [id number]  HPI 01/03/14 Patient was recently admitted to the hospital.  I have copied relevant portions of her discharge summary and included them below for my reference: Admit date: 12/09/2013  Discharge date: 12/13/2013  Time spent: 40 minutes  Recommendations for Outpatient Follow-up:  1. Followup with PCP to evaluate her respirations, and repeat chest x-ray in 6 weeks. 2. Followup of the [facility] patient has a mild anemia with a normal ferritin and iron less than 10 her MCV is unremarkable. She has refused colonoscopies in the past. Discharge Diagnoses:  Principal Problem:  Sepsis  Active Problems:  Hypertension  Hypertriglyceridemia  Diabetes mellitus type II, uncontrolled  Pneumonia  Normocytic anemia  Discharge Condition: stable  Diet recommendation: heart healthy  Filed Weights    12/11/13 0513  12/12/13 0453  12/13/13 0552   Weight:  71.889 kg (158 lb 7.8 oz)  73.8 kg (162 lb 11.2 oz)  73.664 kg (162 lb 6.4 oz)   History of present illness:  80 y.o. female with history of diabetes mellitus, hypertension, hyperlipidemia, reflux sympathetic dystrophic presents to the ER because of fever over the last 24 hours with weakness and fatigue over the last 2-3 days. Patient also has been having nonproductive cough. Denies any chest pain nausea vomiting abdominal pain diarrhea. In the ER chest x-ray shows infiltrates concerning for pneumonia. Patient also has been noticed to have some increasing edema of the left lower extremity. Denies any recent travel or sick contacts. Patient has been admitted for pneumonia. Patient also noticed that patient pressure has been running high. Patient states she has been taking her medications as advised. Initially when patient came patient was tachycardic and was given 2 L of normal saline bolus after which patient's heart rate improved. Patient's blood work showed  mild leukocytosis.  Hospital Course:  Sepsis due to Community acquire pneumonia:  - Present on admission, evidenced by a respiratory rate of 30, temperature 102.6, heart rate of 102, white count of 12,900  - Chest x-ray on admission showing infiltrate in the left mid lung consistent with acute pneumonia  - Continue empiric IV antibiotic azithromycin and ceftriaxone, de-esclate to levaquin.  - Blood cultures remain negative  - She will continue Levaquin for 4 additional days.  Normocytic anemia:  - Anemia panel done shows a normal RDW, B12 and RBC folate.  - Her ferritin was greater than 1000 probably reactive.  - Iron level is less than 10. She was started on ferrous sulfate.  - She has never had a colonoscopy. She does brought no melanotic stools.  - She relates that she is afraid of getting a colonoscopy. She'll have to think about it. I have given her number of gastroenterology for an appointment. We will go ahead and schedule her a new patient appointment with gastroenterology.  Acute respiratory failure  - Evidence by respiratory of 30 on presentation  - Likely secondary to community acquire pneumonia  - Provide supplemental oxygen and empiric IV antibiotic therapy  Type 2 diabetes mellitus  - Blood sugars controlled  - Continue Lantus 60 units subcutaneous daily and SSI.  Hypertension  - No changes were made.  Procedures:  CXR  She is here today for follow up.  She still feels extremely tired.  She is now able to go to her mailbox and back. Her oxygen saturations remain 93-95% with ambulation. She is now  requiring oxygen and laid around her home. However she still feels extremely fatigued. Her hemoglobin nadir at 9.3. She is scheduled to see gastroenterology in July.  She has been compliant with taking iron supplementation. She denies any shortness of breath. She denies any chest pain. She denies any hemoptysis. She recently had a LFTs and fasting lipid panel performed which showed a  drastic reduction in her triglycerides to less than 400. Her liver function tests were normal while on gemfibrozil.  Patient states that her blood pressure home has been ranging 1:15 to 120 over 60s. It is more elevated today in the office at 150/68. However she felt lightheaded and dizzy upon standing. Her anemia I am hesitant to want to increase her blood pressure medication at this time.  At that time, my plan was: 1. Hospital discharge follow-up The patient's pneumonia clinically has resolved. She is slowly returning back to her baseline. She is no longer hypoxic. She denies any fevers or cough or shortness of breath. I do not feel further antibiotics are indicated. I will repeat a chest x-ray to ensure resolution of the pneumonia.  Monitor blood pressure for now and recheck her blood pressure in one week. If blood pressure remains elevated I will increase amlodipine. Satisfied with the triglycerides will not make any changes to gemfibrozil. I will recheck her blood sugars in one week.  2. Anemia Patient's anemia is most likely contributing to her severe fatigue. I will repeat a CBC to see if it is improving on iron replacement. If it is worsening I will expedite her GI referral and discontinue her aspirin if it is worsening. - CBC with Differential  3. CAP (community acquired pneumonia) Clinically resolved repeat chest x-ray. - DG Chest 2 View; Future  4. Left carotid bruit - Carotid duplex; Future  04/25/14 She is here today for follow up.  Hgb was 12 in June.  Carotid dopplers revealed bilateral 40-59% ICA stenosis.  Patient states that her 2 hour postprandial sugars are 200 215. She had to discontinue NovoLog due to hypoglycemic episodes into the 40s in the morning. She is currently only taking Lantus 60 units in the morning. She denies any hypoglycemic episodes using the Lantus. She does have some mild myalgias in her legs but can live with this. She is overdue to recheck her fasting lipid  panel. She denies any chest pain or shortness of breath. She does report generalized fatigue that has not improved since her hospitalization. She denies any orthopnea or paroxysmal nocturnal dyspnea. She would like to receive her flu shot today in clinic. Past Medical History  Diagnosis Date  . Diabetes mellitus   . Hypertension   . Dyslipidemia   . Varicose veins   . Migraines   . GERD (gastroesophageal reflux disease)   . RSD (reflex sympathetic dystrophy) 2007    R wrist/hand following fx  . Anemia    Past Surgical History  Procedure Laterality Date  . Umbilical hernia repair    . Breast cyst excision    . Tubal ligation    . Cholecystectomy  1989  . Appendectomy  1966  . Abdominal hysterectomy  1988  . Several benign cyst removed      last 1 in 1972  . Cataract extraction  10/2009   Current Outpatient Prescriptions on File Prior to Visit  Medication Sig Dispense Refill  . amLODipine (NORVASC) 5 MG tablet TAKE 1 TABLET BY MOUTH EVERY DAY.  30 tablet  11  . aspirin EC  81 MG tablet Take 81 mg by mouth daily.      Marland Kitchen azithromycin (ZITHROMAX) 250 MG tablet 2 tabs poqday1, 1 tab poqday 2-5  6 tablet  0  . B-D ULTRAFINE III SHORT PEN 31G X 8 MM MISC USE AS DIRECTED  100 each  11  . Calcium Carbonate-Vitamin D (CALTRATE 600+D PO) Take 1 tablet by mouth 2 (two) times daily.      . ferrous sulfate 325 (65 FE) MG EC tablet Take 1 tablet (325 mg total) by mouth 3 (three) times daily with meals.  90 tablet  3  . fish oil-omega-3 fatty acids 1000 MG capsule Take 2 g by mouth 2 (two) times daily.        . furosemide (LASIX) 40 MG tablet TAKE 1 TABLET BY MOUTH EVERY DAY  30 tablet  5  . gemfibrozil (LOPID) 600 MG tablet TAKE 1 TABLET BY MOUTH TWICE DAILY BEFORE A MEAL  60 tablet  3  . glucose blood (FREESTYLE TEST STRIPS) test strip Check BS QAM & QHS & TID before each meal - Dx- 250.00  200 each  11  . insulin aspart (NOVOLOG) 100 UNIT/ML injection Inject 5U SQ with breakfast, 7U SQ with  lunch and 7U SQ with dinner.  3 vial  PRN  . insulin glargine (LANTUS) 100 UNIT/ML injection Inject 60 Units into the skin daily with breakfast. Pt uses vials      . losartan (COZAAR) 100 MG tablet TAKE 1 TABLET BY MOUTH EVERY DAY  30 tablet  3  . metoprolol (LOPRESSOR) 50 MG tablet Take 25 mg by mouth 2 (two) times daily.      . Multiple Vitamin (MULTIVITAMIN) tablet Take 1 tablet by mouth daily.        Marland Kitchen omega-3 acid ethyl esters (LOVAZA) 1 G capsule       . omeprazole (PRILOSEC) 20 MG capsule Take 20 mg by mouth daily.      . potassium chloride SA (K-DUR,KLOR-CON) 20 MEQ tablet TAKE 1 TABLET BY MOUTH TWICE DAILY  180 tablet  0  . Pyridoxine HCl (VITAMIN B-6) 100 MG tablet Take 100 mg by mouth daily.         No current facility-administered medications on file prior to visit.   Allergies  Allergen Reactions  . Lipitor [Atorvastatin Calcium] Diarrhea  . Niacin And Related Hives  . Statins Diarrhea  . Macrobid [Nitrofurantoin Macrocrystal] Other (See Comments)    unknown   History   Social History  . Marital Status: Married    Spouse Name: N/A    Number of Children: 3  . Years of Education: N/A   Occupational History  . Retired    Social History Main Topics  . Smoking status: Never Smoker   . Smokeless tobacco: Never Used  . Alcohol Use: No  . Drug Use: No  . Sexual Activity: Not on file   Other Topics Concern  . 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      Objective:   Physical Exam  Vitals reviewed. Constitutional: She appears well-developed and well-nourished. No distress.  Neck: Neck supple. No JVD present. No thyromegaly present.  Cardiovascular: Normal rate, regular rhythm and normal heart sounds.   Pulses:  Carotid pulses are 0 on the right side,  and 1+ on the left side. Pulmonary/Chest: Effort normal and breath sounds normal. No respiratory distress. She has no wheezes. She has no rales.  Abdominal: Soft. Bowel sounds are normal. She exhibits no distension. There is no tenderness. There is no rebound and no guarding.  Musculoskeletal: She exhibits no edema.  Lymphadenopathy:    She has no cervical adenopathy.  Skin: She is not diaphoretic.          Assessment & Plan:  Type II or unspecified type diabetes mellitus without mention of complication, not stated as uncontrolled - Plan: COMPLETE METABOLIC PANEL WITH GFR, Hemoglobin A1c, Lipid panel  Iron deficiency anemia - Plan: CBC with Differential  Essential hypertension  Hypertriglyceridemia  Given the patient's age, I would be happy with hemoglobin A1c between 7 and 8. If greater than 8, I would start the patient on Actos 15-30 mg by mouth daily to try to act as an insulin sensitizer it yet not run the risk of hypoglycemia. Also check a fasting lipid panel. I be happy if I can keep her triglycerides less than 200. Also like to see her LDL cholesterol less than 100. Also follow up a CBC to make sure her anemia is stable. The patient received a flu shot today in clinic.

## 2014-04-26 ENCOUNTER — Other Ambulatory Visit: Payer: Self-pay | Admitting: Family Medicine

## 2014-04-26 ENCOUNTER — Telehealth: Payer: Self-pay | Admitting: Family Medicine

## 2014-04-26 MED ORDER — PIOGLITAZONE HCL 30 MG PO TABS
30.0000 mg | ORAL_TABLET | Freq: Every day | ORAL | Status: DC
Start: 2014-04-26 — End: 2014-08-12

## 2014-04-26 NOTE — Telephone Encounter (Signed)
Pt aware of lab results and provider recommendations.  Is taking her fish oil as prescribed.  Actos to pharmacy.  Recheck 3 months

## 2014-04-26 NOTE — Telephone Encounter (Signed)
Message copied by Olena Mater on Fri Apr 26, 2014  5:35 PM ------      Message from: Jenna Luo      Created: Fri Apr 26, 2014  7:30 AM       Diabetes is poor.  Add actos 30 poqday and recheck in 3 months.  Trigs are elevated.  Is she taking fish oil.  If not add fish oil 2 g poqday. ------

## 2014-04-26 NOTE — Telephone Encounter (Signed)
Medication refilled per protocol. 

## 2014-05-30 ENCOUNTER — Encounter: Payer: PRIVATE HEALTH INSURANCE | Admitting: Family Medicine

## 2014-06-03 ENCOUNTER — Encounter: Payer: Self-pay | Admitting: Family Medicine

## 2014-06-03 ENCOUNTER — Ambulatory Visit (INDEPENDENT_AMBULATORY_CARE_PROVIDER_SITE_OTHER): Payer: PRIVATE HEALTH INSURANCE | Admitting: Family Medicine

## 2014-06-03 ENCOUNTER — Other Ambulatory Visit: Payer: Self-pay | Admitting: Family Medicine

## 2014-06-03 VITALS — BP 140/70 | HR 60 | Temp 98.0°F | Resp 18 | Wt 159.0 lb

## 2014-06-03 DIAGNOSIS — J069 Acute upper respiratory infection, unspecified: Secondary | ICD-10-CM

## 2014-06-03 NOTE — Progress Notes (Signed)
Subjective:    Patient ID: Rebecca Perkins, female    DOB: 08-20-1928, 80 y.o.   MRN: [id number]  HPI Patient presents with a 3 day history of nonproductive cough. She denies any fevers. She denies any chills. She denies any shortness of breath. She denies any dyspnea on exertion. She has a past medical history of sepsis due to pneumonia. This had very rapid onset and has traumatized the patient. She is in fear that she can develop pneumonia rapidly and she wants to be checked out more thoroughly. On examination today her lungs are clear.  However there are very faint fine crackles in the right base. Otherwise her exam is normal. Past Medical History  Diagnosis Date  . Diabetes mellitus   . Hypertension   . Dyslipidemia   . Varicose veins   . Migraines   . GERD (gastroesophageal reflux disease)   . RSD (reflex sympathetic dystrophy) 2007    R wrist/hand following fx  . Anemia    Past Surgical History  Procedure Laterality Date  . Umbilical hernia repair    . Breast cyst excision    . Tubal ligation    . Cholecystectomy  1989  . Appendectomy  1966  . Abdominal hysterectomy  1988  . Several benign cyst removed      last 1 in 1972  . Cataract extraction  10/2009   Current Outpatient Prescriptions on File Prior to Visit  Medication Sig Dispense Refill  . amLODipine (NORVASC) 5 MG tablet TAKE 1 TABLET BY MOUTH EVERY DAY. 30 tablet 11  . aspirin EC 81 MG tablet Take 81 mg by mouth daily.    . B-D ULTRAFINE III SHORT PEN 31G X 8 MM MISC USE AS DIRECTED 100 each 11  . Calcium Carbonate-Vitamin D (CALTRATE 600+D PO) Take 1 tablet by mouth 2 (two) times daily.    . ferrous sulfate 325 (65 FE) MG EC tablet Take 1 tablet (325 mg total) by mouth 3 (three) times daily with meals. 90 tablet 3  . fish oil-omega-3 fatty acids 1000 MG capsule Take 2 g by mouth 2 (two) times daily.      . furosemide (LASIX) 40 MG tablet TAKE 1 TABLET BY MOUTH EVERY DAY 30 tablet 5  . gemfibrozil (LOPID) 600 MG  tablet TAKE 1 TABLET BY MOUTH TWICE DAILY BEFORE A MEAL 60 tablet 3  . glucose blood (FREESTYLE TEST STRIPS) test strip Check BS QAM & QHS & TID before each meal - Dx- 250.00 200 each 11  . insulin aspart (NOVOLOG) 100 UNIT/ML injection Inject 5U SQ with breakfast, 7U SQ with lunch and 7U SQ with dinner. 3 vial PRN  . LANTUS 100 UNIT/ML injection INJECT 64 UNITS SUBCUTANEOUSLY EVERY DAY (Patient taking differently: INJECT 60 UNITS SUBCUTANEOUSLY EVERY DAY) 20 mL 5  . losartan (COZAAR) 100 MG tablet TAKE 1 TABLET BY MOUTH EVERY DAY 30 tablet 3  . metoprolol (LOPRESSOR) 50 MG tablet Take 25 mg by mouth 2 (two) times daily.    . Multiple Vitamin (MULTIVITAMIN) tablet Take 1 tablet by mouth daily.      Marland Kitchen omega-3 acid ethyl esters (LOVAZA) 1 G capsule     . omeprazole (PRILOSEC) 20 MG capsule Take 20 mg by mouth daily.    . potassium chloride SA (K-DUR,KLOR-CON) 20 MEQ tablet TAKE 1 TABLET BY MOUTH TWICE DAILY 180 tablet 0  . Pyridoxine HCl (VITAMIN B-6) 100 MG tablet Take 100 mg by mouth daily.      Marland Kitchen  pioglitazone (ACTOS) 30 MG tablet Take 1 tablet (30 mg total) by mouth daily. 30 tablet 2   No current facility-administered medications on file prior to visit.   Allergies  Allergen Reactions  . Lipitor [Atorvastatin Calcium] Diarrhea  . Niacin And Related Hives  . Statins Diarrhea  . Macrobid [Nitrofurantoin Macrocrystal] Other (See Comments)    unknown   History   Social History  . Marital Status: Married    Spouse Name: N/A    Number of Children: 3  . Years of Education: N/A   Occupational History  . Retired    Social History Main Topics  . Smoking status: Never Smoker   . Smokeless tobacco: Never Used  . Alcohol Use: No  . Drug Use: No  . Sexual Activity: Not on file   Other Topics Concern  . 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      Objective:   Physical Exam  Constitutional: She appears well-developed and well-nourished.  HENT:  Right Ear: External ear normal.  Left Ear: External ear normal.  Nose: Nose normal.  Mouth/Throat: Oropharynx is clear and moist. No oropharyngeal exudate.  Eyes: Conjunctivae and EOM are normal. Right eye exhibits no discharge. Left eye exhibits no discharge. No scleral icterus.  Neck: Normal range of motion. Neck supple. No JVD present. No tracheal deviation present. No thyromegaly present.  Cardiovascular: Normal rate, regular rhythm and normal heart sounds.   No murmur heard. Pulmonary/Chest: Effort normal. No respiratory distress. She has no wheezes. She has no rales.  Abdominal: Soft. Bowel sounds are normal. She exhibits no distension. There is no tenderness. There is no rebound and no guarding.  Vitals reviewed.         Assessment & Plan:  URI, acute  Patient has a viral upper respiratory infection. I do not believe from pneumonia. I have recommended that she take Mucinex 600 mg 1-2 tablets every 12 hours for chest congestion. If the patient develops a fever or if her symptoms acutely worsened, I did give the patient prescription for a Z-Pak to be used as an emergency.  I expect spontaneous resolution over the next 3-4 days.

## 2014-06-07 ENCOUNTER — Ambulatory Visit (INDEPENDENT_AMBULATORY_CARE_PROVIDER_SITE_OTHER): Payer: PRIVATE HEALTH INSURANCE | Admitting: Family Medicine

## 2014-06-07 ENCOUNTER — Encounter: Payer: Self-pay | Admitting: Family Medicine

## 2014-06-07 VITALS — BP 120/62 | HR 60 | Temp 97.9°F | Resp 16 | Ht 64.0 in | Wt 158.0 lb

## 2014-06-07 DIAGNOSIS — Z0000 Encounter for general adult medical examination without abnormal findings: Secondary | ICD-10-CM

## 2014-06-07 NOTE — Progress Notes (Signed)
Subjective:    Patient ID: Rebecca Perkins, female    DOB: Apr 30, 1929, 80 y.o.   MRN: [id number]  HPI For slowly improving. Her bone density was last performed in 2014 significant for a T score of -1.9 in her left femur and a -2.5 in her forearm. She is currently taking calcium and vitamin D. She is due for a bone density next year. She is overdue for mammogram. She has a history of a hysterectomy and therefore does not require a Pap smear. She has never had a colonoscopy and would be due for such however given her age she agrees that it is not prudent to proceed with a colonoscopy. Otherwise she is doing well. Her immunizations including her Pneumovax, Prevnar 13, and flu shot are up-to-date. Past Medical History  Diagnosis Date  . Diabetes mellitus   . Hypertension   . Dyslipidemia   . Varicose veins   . Migraines   . GERD (gastroesophageal reflux disease)   . RSD (reflex sympathetic dystrophy) 2007    R wrist/hand following fx  . Anemia    Past Surgical History  Procedure Laterality Date  . Umbilical hernia repair    . Breast cyst excision    . Tubal ligation    . Cholecystectomy  1989  . Appendectomy  1966  . Abdominal hysterectomy  1988  . Several benign cyst removed      last 1 in 1972  . Cataract extraction  10/2009   Current Outpatient Prescriptions on File Prior to Visit  Medication Sig Dispense Refill  . amLODipine (NORVASC) 5 MG tablet TAKE 1 TABLET BY MOUTH EVERY DAY. 30 tablet 11  . aspirin EC 81 MG tablet Take 81 mg by mouth daily.    . B-D ULTRAFINE III SHORT PEN 31G X 8 MM MISC USE AS DIRECTED 100 each 11  . Calcium Carbonate-Vitamin D (CALTRATE 600+D PO) Take 1 tablet by mouth 2 (two) times daily.    . ferrous sulfate 325 (65 FE) MG EC tablet Take 1 tablet (325 mg total) by mouth 3 (three) times daily with meals. 90 tablet 3  . fish oil-omega-3 fatty acids 1000 MG capsule Take 2 g by mouth 2 (two) times daily.      . furosemide (LASIX) 40 MG tablet TAKE 1  TABLET BY MOUTH EVERY DAY 30 tablet 5  . gemfibrozil (LOPID) 600 MG tablet TAKE 1 TABLET BY MOUTH TWICE DAILY BEFORE A MEAL 60 tablet 3  . glucose blood (FREESTYLE TEST STRIPS) test strip Check BS QAM & QHS & TID before each meal - Dx- 250.00 200 each 11  . insulin aspart (NOVOLOG) 100 UNIT/ML injection Inject 5U SQ with breakfast, 7U SQ with lunch and 7U SQ with dinner. 3 vial PRN  . LANTUS 100 UNIT/ML injection INJECT 64 UNITS SUBCUTANEOUSLY EVERY DAY (Patient taking differently: INJECT 60 UNITS SUBCUTANEOUSLY EVERY DAY) 20 mL 5  . losartan (COZAAR) 100 MG tablet TAKE 1 TABLET BY MOUTH EVERY DAY 30 tablet 3  . metoprolol (LOPRESSOR) 50 MG tablet Take 25 mg by mouth 2 (two) times daily.    . Multiple Vitamin (MULTIVITAMIN) tablet Take 1 tablet by mouth daily.      Marland Kitchen omega-3 acid ethyl esters (LOVAZA) 1 G capsule     . omeprazole (PRILOSEC) 20 MG capsule Take 20 mg by mouth daily.    . pioglitazone (ACTOS) 30 MG tablet Take 1 tablet (30 mg total) by mouth daily. 30 tablet 2  . potassium chloride  SA (K-DUR,KLOR-CON) 20 MEQ tablet TAKE 1 TABLET BY MOUTH TWICE DAILY 180 tablet 0  . Pyridoxine HCl (VITAMIN B-6) 100 MG tablet Take 100 mg by mouth daily.       No current facility-administered medications on file prior to visit.   Allergies  Allergen Reactions  . Lipitor [Atorvastatin Calcium] Diarrhea  . Niacin And Related Hives  . Statins Diarrhea  . Macrobid [Nitrofurantoin Macrocrystal] Other (See Comments)    unknown   History   Social History  . Marital Status: Married    Spouse Name: N/A    Number of Children: 3  . Years of Education: N/A   Occupational History  . Retired    Social History Main Topics  . Smoking status: Never Smoker   . Smokeless tobacco: Never Used  . Alcohol Use: No  . Drug Use: No  . Sexual Activity: Not on file   Other Topics Concern  . 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 Stroke Mother 37  . Hypertension Mother   . Clotting disorder Father   . Heart attack Father   . Arrhythmia Sister   . Stroke Brother       Review of Systems  All other systems reviewed and are negative.      Objective:   Physical Exam  Constitutional: She is oriented to person, place, and time. She appears well-developed and well-nourished. No distress.  HENT:  Head: Normocephalic and atraumatic.  Right Ear: External ear normal.  Left Ear: External ear normal.  Nose: Nose normal.  Mouth/Throat: Oropharynx is clear and moist. No oropharyngeal exudate.  Eyes: Conjunctivae and EOM are normal. Pupils are equal, round, and reactive to light. Right eye exhibits no discharge. Left eye exhibits no discharge. No scleral icterus.  Neck: Normal range of motion. Neck supple. No JVD present. No tracheal deviation present. No thyromegaly present.  Cardiovascular: Normal rate, regular rhythm, normal heart sounds and intact distal pulses.  Exam reveals no gallop and no friction rub.   No murmur heard. Pulmonary/Chest: Effort normal and breath sounds normal. No stridor. No respiratory distress. She has no wheezes. She has no rales. She exhibits no tenderness.  Abdominal: Soft. Bowel sounds are normal. She exhibits no distension and no mass. There is no tenderness. There is no rebound and no guarding.  Musculoskeletal: Normal range of motion. She exhibits no edema or tenderness.  Lymphadenopathy:    She has no cervical adenopathy.  Neurological: She is alert and oriented to person, place, and time. She has normal reflexes. She displays normal reflexes. No cranial nerve deficit. She exhibits normal muscle tone. Coordination normal. . Skin: Skin is warm. No rash noted. She is not diaphoretic. No erythema. No pallor.  Psychiatric: She has a normal mood and affect. Her behavior is normal.  Judgment and thought content normal.  Vitals reviewed.         Assessment & Plan:  Routine general medical examination at a health care facility  His physical exam is normal. Her immunizations are up-to-date. She does not require a Pap smear. I will schedule her for a mammogram. We agree that colonoscopy is not prudent for this patient. I would like her to return fasting in December for a CMP, fasting lipid panel, and hemoglobin A1c. Her diabetic  eye exam is up-to-date. There is a precancerous lesion on her right earlobe and I have recommended that she see her dermatologist for treatment. I offered cryotherapy here today but she declined.

## 2014-06-11 ENCOUNTER — Other Ambulatory Visit: Payer: Self-pay | Admitting: Family Medicine

## 2014-06-11 DIAGNOSIS — Z1231 Encounter for screening mammogram for malignant neoplasm of breast: Secondary | ICD-10-CM

## 2014-06-19 ENCOUNTER — Telehealth: Payer: Self-pay | Admitting: Family Medicine

## 2014-06-19 NOTE — Telephone Encounter (Signed)
Patient was in the hospital and they prescribed her ferrous sulfate, she would like to know if we can refill this for her if possible  [facility]

## 2014-06-19 NOTE — Telephone Encounter (Signed)
Will be glad to call in rx if given the name of what she is actually taking and sig. Tried to call pt no answer and no vm.

## 2014-06-21 MED ORDER — FERROUS SULFATE 325 (65 FE) MG PO TBEC: 325.0000 mg | DELAYED_RELEASE_TABLET | Freq: Three times a day (TID) | ORAL | Status: DC

## 2014-06-21 NOTE — Telephone Encounter (Signed)
Spoke to pt to make sure of dosage on iron supplement and sent to pharm. Pt is aware.

## 2014-07-10 ENCOUNTER — Ambulatory Visit
Admission: RE | Admit: 2014-07-10 | Discharge: 2014-07-10 | Disposition: A | Discharge status: Home / Self Care | Payer: 59 | Source: Ambulatory Visit | Attending: Family Medicine | Admitting: Family Medicine

## 2014-07-10 ENCOUNTER — Telehealth: Payer: Self-pay | Admitting: Family Medicine

## 2014-07-10 DIAGNOSIS — Z1231 Encounter for screening mammogram for malignant neoplasm of breast: Secondary | ICD-10-CM

## 2014-07-10 MED ORDER — ONETOUCH ULTRA SYSTEM W/DEVICE KIT: 1.0000 | PACK | Freq: Once | Status: AC

## 2014-07-10 MED ORDER — GLUCOSE BLOOD VI STRP: 1.0000 | ORAL_STRIP | Freq: Three times a day (TID) | Status: DC

## 2014-07-10 NOTE — Telephone Encounter (Signed)
New diabetic supply order sent to meet new formulary for upcoming year.

## 2014-07-24 ENCOUNTER — Other Ambulatory Visit: Payer: PRIVATE HEALTH INSURANCE

## 2014-07-24 DIAGNOSIS — E781 Pure hyperglyceridemia: Secondary | ICD-10-CM

## 2014-07-24 DIAGNOSIS — E119 Type 2 diabetes mellitus without complications: Secondary | ICD-10-CM

## 2014-07-24 DIAGNOSIS — Z5181 Encounter for therapeutic drug level monitoring: Secondary | ICD-10-CM

## 2014-07-24 DIAGNOSIS — I10 Essential (primary) hypertension: Secondary | ICD-10-CM

## 2014-07-24 LAB — COMPREHENSIVE METABOLIC PANEL
ALT: 12 U/L (ref 0–35)
AST: 17 U/L (ref 0–37)
Albumin: 4.1 g/dL (ref 3.5–5.2)
Alkaline Phosphatase: 100 U/L (ref 39–117)
BUN: 26 mg/dL — ABNORMAL HIGH (ref 6–23)
CO2: 26 mEq/L (ref 19–32)
Calcium: 9.5 mg/dL (ref 8.4–10.5)
Chloride: 105 mEq/L (ref 96–112)
Creat: 1.03 mg/dL (ref 0.50–1.10)
Glucose, Bld: 133 mg/dL — ABNORMAL HIGH (ref 70–99)
Potassium: 4.3 mEq/L (ref 3.5–5.3)
Sodium: 139 mEq/L (ref 135–145)
Total Bilirubin: 0.4 mg/dL (ref 0.2–1.2)
Total Protein: 7.1 g/dL (ref 6.0–8.3)

## 2014-07-24 LAB — LIPID PANEL
Cholesterol: 195 mg/dL (ref 0–200)
HDL: 30 mg/dL — ABNORMAL LOW (ref >39)
Total CHOL/HDL Ratio: 6.5 Ratio
Triglycerides: 485 mg/dL — ABNORMAL HIGH (ref <150)

## 2014-07-24 LAB — HEMOGLOBIN A1C
Hgb A1c MFr Bld: 8.7 % — ABNORMAL HIGH (ref <5.7)
Mean Plasma Glucose: 203 mg/dL — ABNORMAL HIGH (ref <117)

## 2014-08-06 ENCOUNTER — Telehealth: Payer: Self-pay | Admitting: Family Medicine

## 2014-08-06 MED ORDER — FUROSEMIDE 40 MG PO TABS: 40.0000 mg | ORAL_TABLET | Freq: Every day | ORAL | Status: DC

## 2014-08-06 NOTE — Telephone Encounter (Signed)
Error duplicate request

## 2014-08-06 NOTE — Telephone Encounter (Signed)
Medication refill for one time only.  Patient needs to be seen.  Appt 08/12/14

## 2014-08-12 ENCOUNTER — Encounter: Payer: Self-pay | Admitting: Family Medicine

## 2014-08-12 ENCOUNTER — Ambulatory Visit (INDEPENDENT_AMBULATORY_CARE_PROVIDER_SITE_OTHER): Payer: 59 | Admitting: Family Medicine

## 2014-08-12 VITALS — BP 122/68 | HR 60 | Temp 97.5°F | Resp 16 | Ht 64.0 in | Wt 159.0 lb

## 2014-08-12 DIAGNOSIS — IMO0002 Reserved for concepts with insufficient information to code with codable children: Secondary | ICD-10-CM

## 2014-08-12 DIAGNOSIS — E1165 Type 2 diabetes mellitus with hyperglycemia: Secondary | ICD-10-CM

## 2014-08-12 NOTE — Progress Notes (Signed)
Subjective:    Patient ID: Rebecca Perkins, female    DOB: [birth year], 81 y.o.   MRN: [id number]  HPI She is most recent hemoglobin A1c came back elevated at 8.7. She was unable to tolerate Actos. She is currently only on Lantus 60 units in the morning. She is unable to tolerate NovoLog with meals to hyperglycemia shortly thereafter. Her triglycerides are improved but still elevated at 498. Patient is under tremendous stress caring for her husband with dementia. She is also lost her sister, her nephew all recently. Past Medical History  Diagnosis Date  . Diabetes mellitus   . Hypertension   . Dyslipidemia   . Varicose veins   . Migraines   . GERD (gastroesophageal reflux disease)   . RSD (reflex sympathetic dystrophy) 2007    R wrist/hand following fx  . Anemia    Past Surgical History  Procedure Laterality Date  . Umbilical hernia repair    . Breast cyst excision    . Tubal ligation    . Cholecystectomy  1989  . Appendectomy  1966  . Abdominal hysterectomy  1988  . Several benign cyst removed      last 1 in 1972  . Cataract extraction  10/2009   Current Outpatient Prescriptions on File Prior to Visit  Medication Sig Dispense Refill  . amLODipine (NORVASC) 5 MG tablet TAKE 1 TABLET BY MOUTH EVERY DAY. 30 tablet 11  . aspirin EC 81 MG tablet Take 81 mg by mouth daily.    . B-D ULTRAFINE III SHORT PEN 31G X 8 MM MISC USE AS DIRECTED 100 each 11  . Blood Glucose Monitoring Suppl (ONE TOUCH ULTRA SYSTEM KIT) W/DEVICE KIT 1 kit by Does not apply route once. Tests Blood sugar before meals and at bedtime 1 each 0  . Calcium Carbonate-Vitamin D (CALTRATE 600+D PO) Take 1 tablet by mouth 2 (two) times daily.    . ferrous sulfate 325 (65 FE) MG EC tablet Take 1 tablet (325 mg total) by mouth 3 (three) times daily with meals. 90 tablet 11  . fish oil-omega-3 fatty acids 1000 MG capsule Take 2 g by mouth 2 (two) times daily.      . furosemide (LASIX) 40 MG tablet Take 1 tablet (40 mg  total) by mouth daily. 30 tablet 0  . gemfibrozil (LOPID) 600 MG tablet TAKE 1 TABLET BY MOUTH TWICE DAILY BEFORE A MEAL 60 tablet 3  . glucose blood test strip 1 each by Other route 4 (four) times daily -  before meals and at bedtime. Use as instructed 150 each 12  . LANTUS 100 UNIT/ML injection INJECT 64 UNITS SUBCUTANEOUSLY EVERY DAY (Patient taking differently: INJECT 60 UNITS SUBCUTANEOUSLY EVERY DAY) 20 mL 5  . losartan (COZAAR) 100 MG tablet TAKE 1 TABLET BY MOUTH EVERY DAY 30 tablet 3  . metoprolol (LOPRESSOR) 50 MG tablet Take 25 mg by mouth 2 (two) times daily.    . Multiple Vitamin (MULTIVITAMIN) tablet Take 1 tablet by mouth daily.      Marland Kitchen omega-3 acid ethyl esters (LOVAZA) 1 G capsule     . omeprazole (PRILOSEC) 20 MG capsule Take 20 mg by mouth daily.    . potassium chloride SA (K-DUR,KLOR-CON) 20 MEQ tablet TAKE 1 TABLET BY MOUTH TWICE DAILY 180 tablet 0  . Pyridoxine HCl (VITAMIN B-6) 100 MG tablet Take 100 mg by mouth daily.      . insulin aspart (NOVOLOG) 100 UNIT/ML injection Inject 5U SQ with breakfast,  7U SQ with lunch and 7U SQ with dinner. (Patient not taking: Reported on 08/12/2014) 3 vial PRN   No current facility-administered medications on file prior to visit.   Allergies  Allergen Reactions  . Lipitor [Atorvastatin Calcium] Diarrhea  . Niacin And Related Hives  . Statins Diarrhea  . Macrobid [Nitrofurantoin Macrocrystal] Other (See Comments)    unknown   History   Social History  . Marital Status: Married    Spouse Name: N/A    Number of Children: 3  . Years of Education: N/A   Occupational History  . Retired    Social History Main Topics  . Smoking status: Never Smoker   . Smokeless tobacco: Never Used  . Alcohol Use: No  . Drug Use: No  . Sexual Activity: Not on file   Other Topics Concern  . 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      Objective:   Physical Exam  Constitutional: She appears well-developed and well-nourished.  Cardiovascular: Normal rate, regular rhythm and normal heart sounds.   Pulmonary/Chest: Effort normal and breath sounds normal. No respiratory distress. She has no wheezes. She has no rales. She exhibits no tenderness.  Abdominal: Soft. Bowel sounds are normal. She exhibits no distension and no mass. There is no tenderness. There is no rebound and no guarding.  Musculoskeletal: She exhibits no edema.  Vitals reviewed.         Assessment & Plan:  Diabetes mellitus type II, uncontrolled  Continue Lantus 60 units daily. I will add tradjenta 5 mg poqday and recheck in 3 months.  Patient does not want to add statins to control her cholesterol. We will try to focus on brain and her blood sugar to help her triglycerides. I recommended the patient discontinue Coke and she is currently drinking Coke on a daily basis.

## 2014-09-06 ENCOUNTER — Telehealth: Payer: Self-pay | Admitting: Family Medicine

## 2014-09-06 DIAGNOSIS — E119 Type 2 diabetes mellitus without complications: Secondary | ICD-10-CM

## 2014-09-06 NOTE — Telephone Encounter (Signed)
Ok with endocrine referral.

## 2014-09-06 NOTE — Telephone Encounter (Signed)
Received a call from [facility] case management and she was wanting to know if we could send pt to see an endocrinologist that she can not get her BS under control. She is having a lot of hypoglycemic episodes and her A1C has not been below 8.1 in over a year. She was wanting to see Dr. Dwyane Dee. I explained to her that we recently put her on Tradjenta and that she is suppose to f/u with [place] in 3 months however the pt did not start this medication b/c she was afraid of the side effects and when the copay card ran out it would be too expensive for her. MD Please advise.

## 2014-09-09 NOTE — Telephone Encounter (Signed)
Referral placed.

## 2014-09-10 ENCOUNTER — Other Ambulatory Visit: Payer: Self-pay | Admitting: Family Medicine

## 2014-09-16 ENCOUNTER — Other Ambulatory Visit: Payer: Self-pay | Admitting: Family Medicine

## 2014-09-25 ENCOUNTER — Ambulatory Visit: Payer: Self-pay | Admitting: Endocrinology

## 2014-10-02 ENCOUNTER — Other Ambulatory Visit: Payer: Self-pay | Admitting: Family Medicine

## 2014-10-02 NOTE — Telephone Encounter (Signed)
Medication refilled per protocol. 

## 2014-10-04 ENCOUNTER — Ambulatory Visit (INDEPENDENT_AMBULATORY_CARE_PROVIDER_SITE_OTHER): Payer: Medicare Other | Admitting: Endocrinology

## 2014-10-04 ENCOUNTER — Encounter: Payer: Self-pay | Admitting: Endocrinology

## 2014-10-04 VITALS — BP 136/66 | HR 68 | Temp 98.2°F | Resp 14 | Ht 64.0 in | Wt 155.4 lb

## 2014-10-04 DIAGNOSIS — E1165 Type 2 diabetes mellitus with hyperglycemia: Secondary | ICD-10-CM

## 2014-10-04 DIAGNOSIS — D638 Anemia in other chronic diseases classified elsewhere: Secondary | ICD-10-CM

## 2014-10-04 DIAGNOSIS — E781 Pure hyperglyceridemia: Secondary | ICD-10-CM

## 2014-10-04 DIAGNOSIS — G629 Polyneuropathy, unspecified: Secondary | ICD-10-CM

## 2014-10-04 DIAGNOSIS — M810 Age-related osteoporosis without current pathological fracture: Secondary | ICD-10-CM

## 2014-10-04 DIAGNOSIS — E1142 Type 2 diabetes mellitus with diabetic polyneuropathy: Secondary | ICD-10-CM

## 2014-10-04 DIAGNOSIS — IMO0002 Reserved for concepts with insufficient information to code with codable children: Secondary | ICD-10-CM

## 2014-10-04 DIAGNOSIS — E041 Nontoxic single thyroid nodule: Secondary | ICD-10-CM

## 2014-10-04 LAB — TSH: TSH: 2.83 u[IU]/mL (ref 0.35–4.50)

## 2014-10-04 LAB — CBC WITH DIFFERENTIAL/PLATELET
Basophils Absolute: 0 10*3/uL (ref 0.0–0.1)
Basophils Relative: 0.8 % (ref 0.0–3.0)
Eosinophils Absolute: 0.4 10*3/uL (ref 0.0–0.7)
Eosinophils Relative: 7.5 % — ABNORMAL HIGH (ref 0.0–5.0)
HCT: 35.4 % — ABNORMAL LOW (ref 36.0–46.0)
Hemoglobin: 12 g/dL (ref 12.0–15.0)
Lymphocytes Relative: 30.5 % (ref 12.0–46.0)
Lymphs Abs: 1.8 10*3/uL (ref 0.7–4.0)
MCHC: 33.8 g/dL (ref 30.0–36.0)
MCV: 82.4 fl (ref 78.0–100.0)
Monocytes Absolute: 0.7 10*3/uL (ref 0.1–1.0)
Monocytes Relative: 11.7 % (ref 3.0–12.0)
Neutro Abs: 3 10*3/uL (ref 1.4–7.7)
Neutrophils Relative %: 49.5 % (ref 43.0–77.0)
Platelets: 256 10*3/uL (ref 150.0–400.0)
RBC: 4.3 Mil/uL (ref 3.87–5.11)
RDW: 12.7 % (ref 11.5–15.5)
WBC: 6 10*3/uL (ref 4.0–10.5)

## 2014-10-04 LAB — VITAMIN D 25 HYDROXY (VIT D DEFICIENCY, FRACTURES): VITD: 39.74 ng/mL (ref 30.00–100.00)

## 2014-10-04 LAB — T4, FREE: Free T4: 0.76 ng/dL (ref 0.60–1.60)

## 2014-10-04 NOTE — Progress Notes (Signed)
Quick Note:  Please let patient know that the all lab results normal and no further action needed ______

## 2014-10-04 NOTE — Progress Notes (Signed)
Patient ID: Rebecca Perkins, female   DOB: [birth year], 81 y.o.   MRN: [id number]            Reason for Appointment: Consultation for Type 2 Diabetes  Referring physician: Pickard  History of Present Illness:          Diagnosis: Type 2 diabetes mellitus, date of diagnosis: 2006       Past history:  At the time of diagnosis she was feeling tired and weak but does not know what her initial blood sugar was. She probably was tried on metformin initially but because of GI side effects discussed not be continued Also did not tolerate Actos because of nausea. She thinks that she was started on insulin within a few months of her initial diagnosis  Not clear what insulin she was trying in the beginning but she thinks it was NovoLog; however for the last few years has been on Lantus only Her blood sugars have been persistently poorly controlled with A1c usually 8-9% about 2011  Recent history:  She had been given NovoLog to take with her meals but she claimed that she cannot take this because it causes low blood sugars during the night and the next morning.  She thinks that this will happen even with 2 or 3 units, has not taken more than 5 units at any time She has not taken any NovoLog for the last 4-5 months She continues to take 60 units of Lantus a day, previously taking up to 65 units With this her blood sugars are usually controlled in the morning but she has persistently high readings over 200 after supper Does not check blood sugars after breakfast; usually does not eat much at lunch Currently not able to exercise because of fatigue and dyspnea on exertion       Oral hypoglycemic drugs the patient is taking are:      Side effects from medications have been: Metformin, Actos: nausea, diarrhea INSULIN regimen is described as: 60 Lantus in am  Compliance with the medical regimen: Fair  Hypoglycemia:  Occasionally early morning, once in the afternoon, last episode was last week and her  glucose was 50 6 in the afternoon  Glucose monitoring:  done 1-2 times a day         Glucometer: One Touch.      Blood Glucose readings by time of day and averages from meter download:  PREMEAL Breakfast Lunch Dinner Bedtime  Overall   Glucose range:  79-295    171-326    Median:  125     260   171    Self-care: The diet that the patient has been following is: tries to limit .     Meals: 3 meals per day. Breakfast is usually have an egg biscuit.  Usually will have seafood, baked potato and hash puppies with dinner.  She will snack on peanut butter crackers           Exercise: none because of shortness of breath and fatigue on exertion         Dietician visit, most recent none:.               Weight history: Highest weight has been 170  Wt Readings from Last 3 Encounters:  10/04/14 155 lb 6.4 oz (70.489 kg)  08/12/14 159 lb (72.122 kg)  06/07/14 158 lb (71.668 kg)    Glycemic control:   Lab Results  Component Value Date   HGBA1C 8.7* 07/24/2014  HGBA1C 8.7* 04/25/2014   HGBA1C 8.6* 12/10/2013   Lab Results  Component Value Date   MICROALBUR 0.8 05/05/2011   LDLCALC NOT CALC 07/24/2014   CREATININE 1.03 07/24/2014         Medication List       This list is accurate as of: 10/04/14  9:08 AM.  Always use your most recent med list.               amLODipine 5 MG tablet  Commonly known as:  NORVASC  TAKE 1 TABLET BY MOUTH EVERY DAY.     aspirin EC 81 MG tablet  Take 81 mg by mouth daily.     B-D ULTRAFINE III SHORT PEN 31G X 8 MM Misc  Generic drug:  Insulin Pen Needle  USE AS DIRECTED     CALTRATE 600+D PO  Take 1 tablet by mouth 2 (two) times daily.     ferrous sulfate 325 (65 FE) MG EC tablet  Take 1 tablet (325 mg total) by mouth 3 (three) times daily with meals.     fish oil-omega-3 fatty acids 1000 MG capsule  Take 2 g by mouth 2 (two) times daily.     furosemide 40 MG tablet  Commonly known as:  LASIX  TAKE 1 TABLET BY MOUTH DAILY      gemfibrozil 600 MG tablet  Commonly known as:  LOPID  TAKE 1 TABLET BY MOUTH TWICE DAILY BEFORE A MEAL.     glucose blood test strip  1 each by Other route 4 (four) times daily -  before meals and at bedtime. Use as instructed     insulin aspart 100 UNIT/ML injection  Commonly known as:  novoLOG  Inject 5U SQ with breakfast, 7U SQ with lunch and 7U SQ with dinner.     INSULIN SYRINGE 1CC/29G 29G X 1/2" 1 ML Misc     LANTUS 100 UNIT/ML injection  Generic drug:  insulin glargine  INJECT 64 UNITS SUBCUTANEOUSLY EVERY DAY     losartan 100 MG tablet  Commonly known as:  COZAAR  TAKE 1 TABLET BY MOUTH EVERY DAY     metoprolol 50 MG tablet  Commonly known as:  LOPRESSOR  TAKE 1 TABLET BY MOUTH TWICE DAILY     multivitamin tablet  Take 1 tablet by mouth daily.     omega-3 acid ethyl esters 1 G capsule  Commonly known as:  LOVAZA     omeprazole 20 MG capsule  Commonly known as:  PRILOSEC  Take 20 mg by mouth daily.     ONE TOUCH ULTRA SYSTEM KIT W/DEVICE Kit  1 kit by Does not apply route once. Tests Blood sugar before meals and at bedtime     potassium chloride SA 20 MEQ tablet  Commonly known as:  K-DUR,KLOR-CON  TAKE 1 TABLET BY MOUTH TWICE DAILY     pyridOXINE 100 MG tablet  Commonly known as:  VITAMIN B-6  Take 100 mg by mouth daily.        Allergies:  Allergies  Allergen Reactions  . Lipitor [Atorvastatin Calcium] Diarrhea  . Niacin And Related Hives  . Statins Diarrhea  . Macrobid [Nitrofurantoin Macrocrystal] Other (See Comments)    unknown    Past Medical History  Diagnosis Date  . Diabetes mellitus   . Hypertension   . Dyslipidemia   . Varicose veins   . Migraines   . GERD (gastroesophageal reflux disease)   . RSD (reflex sympathetic dystrophy) 2007  R wrist/hand following fx  . Anemia     Past Surgical History  Procedure Laterality Date  . Umbilical hernia repair    . Breast cyst excision    . Tubal ligation    . Cholecystectomy  1989    . Appendectomy  1966  . Abdominal hysterectomy  1988  . Several benign cyst removed      last 1 in 1972  . Cataract extraction  10/2009    Family History  Problem Relation Age of Onset  . Stroke Mother 24  . Hypertension Mother   . Clotting disorder Father   . Heart attack Father   . Arrhythmia Sister   . Stroke Brother     Social History:  reports that she has never smoked. She has never used smokeless tobacco. She reports that she does not drink alcohol or use illicit drugs.    Review of Systems       Vision is normal. Most recent eye exam was 10/15       Lipids:        Lab Results  Component Value Date   CHOL 195 07/24/2014   HDL 30* 07/24/2014   LDLCALC NOT CALC 07/24/2014   LDLDIRECT 41.8 09/13/2011   TRIG 485* 07/24/2014   CHOLHDL 6.5 07/24/2014                  Skin: No rash or infections     Thyroid:   Has complaints of significant fatigue since her last hospitalization. Stays cold also  No results found for: TSH      The blood pressure has been managed with losartan and metoprolol as well as amlodipine 5 mg No palpitations, previously started on metoprolol 4 days      No swelling of feet.     No shortness of breath or chest tightness  on exertion.     Bowel habits: Normal.     She takes Prilosec for heartburn  No difficulties with anxiety or depression      No joint  pains.          She has a history of Numbness in her feet and toes, mostly later in the day but better overnight.  She has had the symptoms for 6 months. No pain, tingling or burning in feet    Balance has been a little off and she thinks she has a feeling of weakness in her legs for the last 6 months or so  Anemia: She had been significantly anemic in 2015 and this improved with iron.  Lab Results  Component Value Date   WBC 5.8 04/25/2014   HGB 11.5* 04/25/2014   HCT 33.9* 04/25/2014   MCV 80.1 04/25/2014   PLT 216 04/25/2014      LABS:  No visits with results  within 1 Week(s) from this visit. Latest known visit with results is:  Lab on 07/24/2014  Component Date Value Ref Range Status  . Sodium 07/24/2014 139  135 - 145 mEq/L Final  . Potassium 07/24/2014 4.3  3.5 - 5.3 mEq/L Final  . Chloride 07/24/2014 105  96 - 112 mEq/L Final  . CO2 07/24/2014 26  19 - 32 mEq/L Final  . Glucose, Bld 07/24/2014 133* 70 - 99 mg/dL Final  . BUN 07/24/2014 26* 6 - 23 mg/dL Final  . Creat 07/24/2014 1.03  0.50 - 1.10 mg/dL Final  . Total Bilirubin 07/24/2014 0.4  0.2 - 1.2 mg/dL Final  . Alkaline Phosphatase 07/24/2014 100  39 - 117 U/L Final  . AST 07/24/2014 17  0 - 37 U/L Final  . ALT 07/24/2014 12  0 - 35 U/L Final  . Total Protein 07/24/2014 7.1  6.0 - 8.3 g/dL Final  . Albumin 07/24/2014 4.1  3.5 - 5.2 g/dL Final  . Calcium 07/24/2014 9.5  8.4 - 10.5 mg/dL Final  . Hgb A1c MFr Bld 07/24/2014 8.7* <5.7 % Final   Comment:                                                                        According to the ADA Clinical Practice Recommendations for 2011, when HbA1c is used as a screening test:     >=6.5%   Diagnostic of Diabetes Mellitus            (if abnormal result is confirmed)   5.7-6.4%   Increased risk of developing Diabetes Mellitus   References:Diagnosis and Classification of Diabetes Mellitus,Diabetes ZCHY,8502,77(AJOIN 1):S62-S69 and Standards of Medical Care in         Diabetes - 2011,Diabetes Care,2011,34 (Suppl 1):S11-S61.     . Mean Plasma Glucose 07/24/2014 203* <117 mg/dL Final  . Cholesterol 07/24/2014 195  0 - 200 mg/dL Final   Comment: ATP III Classification:       < 200        mg/dL        Desirable      200 - 239     mg/dL        Borderline High      >= 240        mg/dL        High     . Triglycerides 07/24/2014 485* <150 mg/dL Final  . HDL 07/24/2014 30* >39 mg/dL Final  . Total CHOL/HDL Ratio 07/24/2014 6.5   Final  . VLDL 07/24/2014 NOT CALC  0 - 40 mg/dL Final   Comment:   Not calculated due to Triglyceride  >400. Suggest ordering Direct LDL (Unit Code: [phone]).   . LDL Cholesterol 07/24/2014 NOT CALC  0 - 99 mg/dL Final   Comment:   Not calculated due to Triglyceride >400. Suggest ordering Direct LDL (Unit Code: [phone]).   Total Cholesterol/HDL Ratio:CHD Risk                        Coronary Heart Disease Risk Table                                        Men       Women          1/2 Average Risk              3.4        3.3              Average Risk              5.0        4.4           2X Average Risk  9.6        7.1           3X Average Risk             23.4       11.0 Use the calculated Patient Ratio above and the CHD Risk table  to determine the patient's CHD Risk. ATP III Classification (LDL):       < 100        mg/dL         Optimal      100 - 129     mg/dL         Near or Above Optimal      130 - 159     mg/dL         Borderline High      160 - 189     mg/dL         High       > 190        mg/dL         Very High       Physical Examination:  BP 136/66 mmHg  Pulse 68  Temp(Src) 98.2 F (36.8 C)  Resp 14  Ht 5' 4" (1.626 m)  Wt 155 lb 6.4 oz (70.489 kg)  BMI 26.66 kg/m2  SpO2 94%  GENERAL:         she is averagely built and nourished, no pallor, looks well    HEENT:         Eye exam shows normal external appearance. Fundus exam shows no retinopathy. Oral exam shows normal mucosa .  NECK:         General:  Neck exam shows no lymphadenopathy. Carotids are normal to palpation and no bruit heard.  Thyroid is  enlarged mostly on the right side, nodular.  Has a 3 cm nodule on the right side especially felt on swallowing.  Left medial lobe is also just palpable on swallowing and firm LUNGS:         Chest is symmetrical. Lungs are clear to auscultation.Marland Kitchen   HEART:         Heart sounds:  S1 and S2 are normal. No murmurs or clicks heard., no S3 or S4.   ABDOMEN:   There is no distention present. Liver and spleen are not palpable. No other mass or tenderness present.    EXTREMITIES:     There is no edema. No skin lesions present.Marland Kitchen  NEUROLOGICAL:   Vibration sense is markedly reduced in toes. Ankle jerks are decreased on the right and normal on the left bilaterally. Biceps reflexes are normal and relaxation appears to be normal           Diabetic foot exam:  abnormal for significantly decreased monofilament sensation in the toes. Absent pedal pulses.  No skin or bone abnormalities on exam, toenails normal  MUSCULOSKELETAL:       There is no enlargement or deformity of the joints. Spine is normal to inspection.Marland Kitchen   SKIN:       No rash or lesions of concern.        ASSESSMENT:  Diabetes type 2, uncontrolled    Patient is on Lantus only with no coverage of her meals with prandial insulin This is causing persistently high A1c levels. She is afraid of taking NovoLog because of previous experience of getting hypoglycemic overnight with taking her coverage at suppertime She still tends to get occasional hypoglycemia overnight and in the  afternoon indicating excessive amounts of Lantus insulin and this was discussed with the patient. She is intolerant to metformin and is not taking any oral insulin sensitizing drugs. Her diet is reasonably good although could be a little lower in fat. Unable to exercise because of weakness and dyspnea.  Complications: Peripheral neuropathy with mild sensory loss.  She does have subjective numbness but also may be having balance issues related to the neuropathy  Peripheral vascular disease on exam mostly with small vessel involvement No history of coronary disease  Hypertension: Well controlled  Multinodular goiter with mostly a right-sided nodule on exam.  This appears to be a new finding and she has not had any baseline thyroid levels  Fatigue and exercise intolerance: Etiology unclear.  She may have hypothyroidism, recurrence of her anemia or this may be related to decreased cardiac function and continued  hyperglycemia  HYPERLIPIDEMIA:  She has persistently high triglycerides despite taking gemfibrozil and fish oil.  Not clear if she was intolerant to fenofibrate  OSTEOPENIA: Her bone density was mildly osteoporotic at the forearm and will need follow-up.  She has not had a vitamin D level which will be checked today.  PLAN:   Discussed with the patient that she is getting hypoglycemia from NovoLog only because of being on relative excess amount of basal insulin and the low blood sugars occur late at night or the next morning when her Lantus is active instead of the NovoLog that she takes at suppertime.  She was encouraged to start taking at least 2 units of NovoLog with her evening meal and at the same time reducing her Lantus significantly by 10 units for now  Discussed blood sugar targets for both fasting and postprandial readings and if her postprandial readings continue to be high she can gradually increase her NovoLog by 1 unit at a time  Most likely she may need relatively higher doses of NovoLog than 2-3 units and may also need some with her morning meal; currently not monitoring postprandial readings after breakfast  Since her diet is fairly good usually will not change this although she may benefit from consultation with dietitian anyway  For her symptoms of fatigue and findings of a goiter will need to do thyroid function tests  Will recheck her hemoglobin to exclude this as a cause of her fatigue and shortness of breath  Her hypertriglyceridemia may improve with improved diabetes control but she may need to modify her diet further  Discussed general preventive measures for diabetic neuropathy and sensory loss  Also discussed management of osteopenia which may be significant at her age especially with uncontrolled diabetes  Counseling time over 50% of today's 60 minute visit  Thana Ramp 10/04/2014, 9:08 AM   Note: This office note was prepared with Forensic psychologist. Any transcriptional errors that result from this process are unintentional.

## 2014-10-04 NOTE — Patient Instructions (Signed)
Please check blood sugars at least half the time about 2 hours after any meal and 3 times per week on waking up. Please bring blood sugar monitor to each visit. Recommended blood sugar levels about 2 hours after meal is 140-180 and on waking up 90-130  LANTUS: Reduce this to 50 units, may need to go up or down 2-4 units after a week if blood sugars are consistently below 100 or over 150  NOVOLOG insulin start taking 2 units right before supper time.  If the blood sugar is consistently over 200 after supper may go up 1 unit at a time to get blood sugars down to target May need to add low NovoLog at breakfast also if sugar is high after breakfast  Have a consistent amount of carbohydrate and protein at suppertime

## 2014-10-17 ENCOUNTER — Other Ambulatory Visit: Payer: Self-pay | Admitting: Family Medicine

## 2014-10-17 NOTE — Telephone Encounter (Signed)
Refill appropriate and filled per protocol. 

## 2014-10-18 ENCOUNTER — Ambulatory Visit (INDEPENDENT_AMBULATORY_CARE_PROVIDER_SITE_OTHER): Payer: Medicare Other | Admitting: Endocrinology

## 2014-10-18 ENCOUNTER — Encounter: Payer: Self-pay | Admitting: Endocrinology

## 2014-10-18 VITALS — BP 124/76 | HR 63 | Temp 98.3°F | Ht 64.0 in | Wt 155.0 lb

## 2014-10-18 DIAGNOSIS — E1142 Type 2 diabetes mellitus with diabetic polyneuropathy: Secondary | ICD-10-CM

## 2014-10-18 DIAGNOSIS — E1165 Type 2 diabetes mellitus with hyperglycemia: Secondary | ICD-10-CM

## 2014-10-18 DIAGNOSIS — IMO0002 Reserved for concepts with insufficient information to code with codable children: Secondary | ICD-10-CM

## 2014-10-18 DIAGNOSIS — G629 Polyneuropathy, unspecified: Secondary | ICD-10-CM

## 2014-10-18 NOTE — Progress Notes (Signed)
Patient ID: Rebecca Perkins, female   DOB: July 25, 1929, 81 y.o.   MRN: [id number]            Reason for Appointment: Follow-up for Type 2 Diabetes  Referring physician: Pickard  History of Present Illness:          Diagnosis: Type 2 diabetes mellitus, date of diagnosis: 2006       Past history:  At the time of diagnosis Rebecca Perkins was feeling tired and weak but does not know what Rebecca Perkins initial blood sugar was. Rebecca Perkins probably was tried on metformin initially but because of GI side effects discussed not be continued Also did not tolerate Actos because of nausea. Rebecca Perkins thinks that Rebecca Perkins was started on insulin within a few months of Rebecca Perkins initial diagnosis  Not clear what insulin Rebecca Perkins was trying in the beginning but Rebecca Perkins thinks it was NovoLog; however for the last few years has been on Lantus only Rebecca Perkins blood sugars have been persistently poorly controlled with A1c usually 8-9% about 2011  Recent history:  Are not initial consultation Rebecca Perkins was taking 60 units of Lantus insulin without any mealtime coverage Because of Rebecca Perkins tendency to low blood sugars including overnight Rebecca Perkins was told to reduce the dose to 50 units Also was having marked increase in postprandial readings at times a occasionally over 300 and prior A1c levels had been about 8.7 Rebecca Perkins did reduce Rebecca Perkins Lantus as directed at the end of February but did not start taking Rebecca Perkins NovoLog until about a week ago for fear of hypoglycemia. Rebecca Perkins did start with 3-4 units and now taking 5 units with supper. Current blood sugars show somewhat variable readings in the mornings although has had about 3 readings below 140 Postprandial readings after supper are still over 200 except on Wednesday night when it was 123 This is despite eating a relatively low carbohydrate meal in the evening usually Does not check blood sugars after breakfast; Rebecca Perkins does have some carbohydrate at times at breakfast. Rebecca Perkins usually does not eat much at lunch Currently not able to exercise because of  fatigue and dyspnea on exertion       Oral hypoglycemic drugs the patient is taking are:   none     Side effects from medications have been: Metformin, Actos: nausea, diarrhea INSULIN regimen is described as: 50 Lantus in am, NovoLog 5 at supper   Compliance with the medical regimen: Fair  Hypoglycemia: None recently  Glucose monitoring:  done 1-2 times a day         Glucometer: One Touch.      Blood Glucose readings as above.  Average for the last 30 days is 198  Self-care: The diet that the patient has been following is: tries to limit .     Meals: 3 meals per day. Breakfast is variable, sometimes may have only some fruitful carbohydrate and other times may have a biscuit or toast.  Usually has an egg.  May sometimes have seafood, baked potato and hush puppies with dinner otherwise has relatively low amount of starch.   Rebecca Perkins will snack on peanut butter crackers which Rebecca Perkins will have at lunch with a small a regular soft drinks            Exercise: none because of shortness of breath and fatigue on exertion         Dietician visit, most recent none:               Weight history: Highest weight has  been 170  Wt Readings from Last 3 Encounters:  10/18/14 155 lb (70.308 kg)  10/04/14 155 lb 6.4 oz (70.489 kg)  08/12/14 159 lb (72.122 kg)    Glycemic control:   Lab Results  Component Value Date   HGBA1C 8.7* 07/24/2014   HGBA1C 8.7* 04/25/2014   HGBA1C 8.6* 12/10/2013   Lab Results  Component Value Date   MICROALBUR 0.8 05/05/2011   LDLCALC NOT CALC 07/24/2014   CREATININE 1.03 07/24/2014         Medication List       This list is accurate as of: 10/18/14  9:31 AM.  Always use your most recent med list.               amLODipine 5 MG tablet  Commonly known as:  NORVASC  TAKE 1 TABLET BY MOUTH EVERY DAY.     aspirin EC 81 MG tablet  Take 81 mg by mouth daily.     B-D ULTRAFINE III SHORT PEN 31G X 8 MM Misc  Generic drug:  Insulin Pen Needle  USE AS DIRECTED      CALTRATE 600+D PO  Take 1 tablet by mouth 2 (two) times daily.     ferrous sulfate 325 (65 FE) MG EC tablet  Take 1 tablet (325 mg total) by mouth 3 (three) times daily with meals.     fish oil-omega-3 fatty acids 1000 MG capsule  Take 2 g by mouth 2 (two) times daily.     furosemide 40 MG tablet  Commonly known as:  LASIX  TAKE 1 TABLET BY MOUTH DAILY     gemfibrozil 600 MG tablet  Commonly known as:  LOPID  TAKE 1 TABLET BY MOUTH TWICE DAILY BEFORE A MEAL.     glucose blood test strip  1 each by Other route 4 (four) times daily -  before meals and at bedtime. Use as instructed     insulin aspart 100 UNIT/ML injection  Commonly known as:  novoLOG  Inject 5U SQ with breakfast, 7U SQ with lunch and 7U SQ with dinner.     INSULIN SYRINGE 1CC/29G 29G X 1/2" 1 ML Misc     LANTUS 100 UNIT/ML injection  Generic drug:  insulin glargine  INJECT 64 UNITS SUBCUTANEOUSLY EVERY DAY     losartan 100 MG tablet  Commonly known as:  COZAAR  TAKE 1 TABLET BY MOUTH EVERY DAY     metoprolol 50 MG tablet  Commonly known as:  LOPRESSOR  TAKE 1 TABLET BY MOUTH TWICE DAILY     multivitamin tablet  Take 1 tablet by mouth daily.     omega-3 acid ethyl esters 1 G capsule  Commonly known as:  LOVAZA     omeprazole 20 MG capsule  Commonly known as:  PRILOSEC  TAKE 1 CAPSULE BY MOUTH EVERY DAY     ONE TOUCH ULTRA SYSTEM KIT W/DEVICE Kit  1 kit by Does not apply route once. Tests Blood sugar before meals and at bedtime     potassium chloride SA 20 MEQ tablet  Commonly known as:  K-DUR,KLOR-CON  TAKE 1 TABLET BY MOUTH TWICE DAILY     pyridOXINE 100 MG tablet  Commonly known as:  VITAMIN B-6  Take 100 mg by mouth daily.        Allergies:  Allergies  Allergen Reactions  . Lipitor [Atorvastatin Calcium] Diarrhea  . Niacin And Related Hives  . Statins Diarrhea  . Macrobid [Nitrofurantoin Macrocrystal] Other (See Comments)  unknown    Past Medical History  Diagnosis Date  .  Diabetes mellitus   . Hypertension   . Dyslipidemia   . Varicose veins   . Migraines   . GERD (gastroesophageal reflux disease)   . RSD (reflex sympathetic dystrophy) 2007    R wrist/hand following fx  . Anemia     Past Surgical History  Procedure Laterality Date  . Umbilical hernia repair    . Breast cyst excision    . Tubal ligation    . Cholecystectomy  1989  . Appendectomy  1966  . Abdominal hysterectomy  1988  . Several benign cyst removed      last 1 in 1972  . Cataract extraction  10/2009    Family History  Problem Relation Age of Onset  . Stroke Mother 77  . Hypertension Mother   . Clotting disorder Father   . Heart attack Father   . Arrhythmia Sister   . Stroke Brother   . Diabetes Neg Hx   . Thyroid disease Neg Hx     Social History:  reports that Rebecca Perkins has never smoked. Rebecca Perkins has never used smokeless tobacco. Rebecca Perkins reports that Rebecca Perkins does not drink alcohol or use illicit drugs.    Review of Systems       Vision is normal. Most recent eye exam was 10/15       Lipids:        Lab Results  Component Value Date   CHOL 195 07/24/2014   HDL 30* 07/24/2014   LDLCALC NOT CALC 07/24/2014   LDLDIRECT 41.8 09/13/2011   TRIG 485* 07/24/2014   CHOLHDL 6.5 07/24/2014                  Skin: No rash or infections     Thyroid:   Has complaints of significant fatigue since Rebecca Perkins last hospitalization but TSH is normal.  On exam has a 3 cm nodule on the right side  Lab Results  Component Value Date   TSH 2.83 10/04/2014       The blood pressure has been managed with losartan and metoprolol as well as amlodipine 5 mg         Rebecca Perkins has a history of Numbness in Rebecca Perkins feet and toes, mostly later in the day but better overnight.  Rebecca Perkins has had the symptoms for 6 months. Last foot exam was in 2/16 showing sensory loss   LABS:  No visits with results within 1 Week(s) from this visit. Latest known visit with results is:  Office Visit on 10/04/2014  Component Date Value Ref  Range Status  . TSH 10/04/2014 2.83  0.35 - 4.50 uIU/mL Final  . Free T4 10/04/2014 0.76  0.60 - 1.60 ng/dL Final  . VITD 10/04/2014 39.74  30.00 - 100.00 ng/mL Final  . WBC 10/04/2014 6.0  4.0 - 10.5 K/uL Final  . RBC 10/04/2014 4.30  3.87 - 5.11 Mil/uL Final  . Hemoglobin 10/04/2014 12.0  12.0 - 15.0 g/dL Final  . HCT 10/04/2014 35.4* 36.0 - 46.0 % Final  . MCV 10/04/2014 82.4  78.0 - 100.0 fl Final  . MCHC 10/04/2014 33.8  30.0 - 36.0 g/dL Final  . RDW 10/04/2014 12.7  11.5 - 15.5 % Final  . Platelets 10/04/2014 256.0  150.0 - 400.0 K/uL Final  . Neutrophils Relative % 10/04/2014 49.5  43.0 - 77.0 % Final  . Lymphocytes Relative 10/04/2014 30.5  12.0 - 46.0 % Final  . Monocytes Relative 10/04/2014  11.7  3.0 - 12.0 % Final  . Eosinophils Relative 10/04/2014 7.5* 0.0 - 5.0 % Final  . Basophils Relative 10/04/2014 0.8  0.0 - 3.0 % Final  . Neutro Abs 10/04/2014 3.0  1.4 - 7.7 K/uL Final  . Lymphs Abs 10/04/2014 1.8  0.7 - 4.0 K/uL Final  . Monocytes Absolute 10/04/2014 0.7  0.1 - 1.0 K/uL Final  . Eosinophils Absolute 10/04/2014 0.4  0.0 - 0.7 K/uL Final  . Basophils Absolute 10/04/2014 0.0  0.0 - 0.1 K/uL Final    Physical Examination:  BP 124/76 mmHg  Pulse 63  Temp(Src) 98.3 F (36.8 C) (Oral)  Ht $R'5\' 4"'Xl$  (1.626 m)  Wt 155 lb (70.308 kg)  BMI 26.59 kg/m2  SpO2 95%     ASSESSMENT /PLAN:  Diabetes type 2, uncontrolled     Rebecca Perkins blood sugars are improving with reducing Rebecca Perkins Lantus and starting low doses of NovoLog at suppertime Rebecca Perkins still had inconsistent readings but not having hypoglycemia now with reducing Rebecca Perkins Lantus Since Rebecca Perkins feels more comfortable doing Rebecca Perkins NovoLog with suppertime Rebecca Perkins was advised to continue doing at least 5 units depending on the size of the meal and carbohydrate intake and evening. Discussed needing to check some readings after breakfast and Rebecca Perkins probably needs NovoLog at breakfast also especially if eating any carbohydrates  Also Rebecca Perkins has one high reading  at suppertime and discussed that if Rebecca Perkins is going to have regular Coke at lunch Rebecca Perkins may need NovoLog for this time also Rebecca Perkins is also interested in seeing the dietitian  Discussed timing of glucose monitoring, blood sugar targets and adjustment of mealtime insulin Rebecca Perkins will continue Lantus unchanged for now unless fasting readings are out of range consistently  Patient Instructions  Please check blood sugars at least half the time about 2 hours after any meal and 3 times per week on waking up. Please bring blood sugar monitor to each visit. Recommended blood sugar levels about 2 hours after meal is 140-180 and on waking up 90-130  3-5 Novolog in am if sugar >180 after breakfast  If am sugar stays >150 for 3-4 days go up 2 on Lantus   Counseling time over 50% of today's 25 minute visit   Martrice Apt 10/18/2014, 9:31 AM   Note: This office note was prepared with Estate agent. Any transcriptional errors that result from this process are unintentional.

## 2014-10-18 NOTE — Patient Instructions (Signed)
Please check blood sugars at least half the time about 2 hours after any meal and 3 times per week on waking up. Please bring blood sugar monitor to each visit. Recommended blood sugar levels about 2 hours after meal is 140-180 and on waking up 90-130  3-5 Novolog in am if sugar >180 after breakfast  If am sugar stays >150 for 3-4 days go up 2 on Lantus

## 2014-11-18 ENCOUNTER — Encounter: Payer: Self-pay | Admitting: *Deleted

## 2014-11-28 ENCOUNTER — Ambulatory Visit (INDEPENDENT_AMBULATORY_CARE_PROVIDER_SITE_OTHER): Payer: Medicare Other | Admitting: Endocrinology

## 2014-11-28 ENCOUNTER — Encounter: Payer: Self-pay | Admitting: Endocrinology

## 2014-11-28 VITALS — BP 118/64 | HR 64 | Temp 98.5°F | Resp 14 | Ht 64.0 in | Wt 154.8 lb

## 2014-11-28 DIAGNOSIS — E781 Pure hyperglyceridemia: Secondary | ICD-10-CM | POA: Diagnosis not present

## 2014-11-28 DIAGNOSIS — IMO0002 Reserved for concepts with insufficient information to code with codable children: Secondary | ICD-10-CM

## 2014-11-28 DIAGNOSIS — E1165 Type 2 diabetes mellitus with hyperglycemia: Secondary | ICD-10-CM | POA: Diagnosis not present

## 2014-11-28 LAB — BASIC METABOLIC PANEL
BUN: 34 mg/dL — ABNORMAL HIGH (ref 6–23)
CO2: 25 mEq/L (ref 19–32)
Calcium: 9.8 mg/dL (ref 8.4–10.5)
Chloride: 105 mEq/L (ref 96–112)
Creatinine, Ser: 1.19 mg/dL (ref 0.40–1.20)
GFR: 45.99 mL/min — ABNORMAL LOW (ref >60.00)
Glucose, Bld: 167 mg/dL — ABNORMAL HIGH (ref 70–99)
Potassium: 4.4 mEq/L (ref 3.5–5.1)
Sodium: 138 mEq/L (ref 135–145)

## 2014-11-28 LAB — HEMOGLOBIN A1C: Hgb A1c MFr Bld: 8.7 % — ABNORMAL HIGH (ref 4.6–6.5)

## 2014-11-28 NOTE — Progress Notes (Signed)
Patient ID: Rebecca Perkins, female   DOB: 1929-04-16, 81 y.o.   MRN: [id number]            Reason for Appointment: Follow-up for Type 2 Diabetes  Referring physician: Pickard  History of Present Illness:          Diagnosis: Type 2 diabetes mellitus, date of diagnosis: 2006       Past history:  At the time of diagnosis she was feeling tired and weak but does not know what her initial blood sugar was. She probably was tried on metformin initially but because of GI side effects discussed not be continued Also did not tolerate Actos because of nausea. She thinks that she was started on insulin within a few months of her initial diagnosis  Not clear what insulin she was trying in the beginning but she thinks it was NovoLog; however for the last few years has been on Lantus only Her blood sugars have been persistently poorly controlled with A1c usually 8-9% about 2011  Recent history:   INSULIN regimen is described as: 50 Lantus in am, NovoLog 0-5 at supper  At initial consultation she was taking 60 units of Lantus insulin without any mealtime coverage Because of her tendency to low blood sugars including overnight she was told to reduce the dose to 50 units Also was having marked increase in postprandial readings at times a occasionally over 300 and prior A1c levels had been about 8.7  With reducing her Lantus and starting NovoLog with meals her blood sugars were improving. No recent A1c is available  Current blood sugars and problems identified:  She has stopped taking her NovoLog at least for the last 2 weeks.  She blames this on having to take care of her husband.  She is checking blood sugars mostly in the morning and after supper  Blood sugars after supper are consistently over 200 and his highest 307  She has a relatively high reading after breakfast but checked only once  No readings after lunch even though she is drinking regular soft drinks at that time Currently not  able to exercise because of fatigue and dyspnea on exertion       Oral hypoglycemic drugs the patient is taking are:   none     Side effects from medications have been: Metformin, Actos: nausea, diarrhea    Compliance with the medical regimen: Fair  Hypoglycemia: None recently  Glucose monitoring:  done 1-2 times a day         Glucometer: One Touch.      Blood Glucose readings as above.    PRE-MEAL Breakfast Lunch Dinner Bedtime Overall  Glucose range: 120-240   115-307   Median:  144     270   192     Self-care: The diet that the patient has been following is: tries to limit portions .     Meals: 3 meals per day. Breakfast is variable, sometimes may have only some fruit with egg and other times may have a biscuit or toast.   Usually has an egg.  May sometimes have seafood, baked potato and hush puppies with dinner otherwise has relatively low amount of starch.   She will snack on peanut butter crackers which she will have regular soft drinks at lunch             Exercise: none because of shortness of breath and fatigue on exertion         Dietician visit, most  recent none:               Weight history: Highest weight has been 170  Wt Readings from Last 3 Encounters:  11/28/14 154 lb 12.8 oz (70.217 kg)  10/18/14 155 lb (70.308 kg)  10/04/14 155 lb 6.4 oz (70.489 kg)    Glycemic control:   Lab Results  Component Value Date   HGBA1C 8.7* 11/28/2014   HGBA1C 8.7* 07/24/2014   HGBA1C 8.7* 04/25/2014   Lab Results  Component Value Date   MICROALBUR 0.8 05/05/2011   [place] NOT CALC 07/24/2014   CREATININE 1.19 11/28/2014         Medication List       This list is accurate as of: 11/28/14  4:32 PM.  Always use your most recent med list.               amLODipine 5 MG tablet  Commonly known as:  NORVASC  TAKE 1 TABLET BY MOUTH EVERY DAY.     aspirin EC 81 MG tablet  Take 81 mg by mouth daily.     B-D ULTRAFINE III SHORT PEN 31G X 8 MM Misc  Generic drug:   Insulin Pen Needle  USE AS DIRECTED     CALTRATE 600+D PO  Take 1 tablet by mouth 2 (two) times daily.     ferrous sulfate 325 (65 FE) MG EC tablet  Take 1 tablet (325 mg total) by mouth 3 (three) times daily with meals.     fish oil-omega-3 fatty acids 1000 MG capsule  Take 2 g by mouth 2 (two) times daily.     furosemide 40 MG tablet  Commonly known as:  LASIX  TAKE 1 TABLET BY MOUTH DAILY     gemfibrozil 600 MG tablet  Commonly known as:  LOPID  TAKE 1 TABLET BY MOUTH TWICE DAILY BEFORE A MEAL.     glucose blood test strip  1 each by Other route 4 (four) times daily -  before meals and at bedtime. Use as instructed     insulin aspart 100 UNIT/ML injection  Commonly known as:  novoLOG  Inject 5U SQ with breakfast, 7U SQ with lunch and 7U SQ with dinner.     INSULIN SYRINGE 1CC/29G 29G X 1/2" 1 ML Misc     LANTUS 100 UNIT/ML injection  Generic drug:  insulin glargine  INJECT 64 UNITS SUBCUTANEOUSLY EVERY DAY     losartan 100 MG tablet  Commonly known as:  COZAAR  TAKE 1 TABLET BY MOUTH EVERY DAY     metoprolol 50 MG tablet  Commonly known as:  LOPRESSOR  TAKE 1 TABLET BY MOUTH TWICE DAILY     multivitamin tablet  Take 1 tablet by mouth daily.     omega-3 acid ethyl esters 1 G capsule  Commonly known as:  LOVAZA     omeprazole 20 MG capsule  Commonly known as:  PRILOSEC  TAKE 1 CAPSULE BY MOUTH EVERY DAY     ONE TOUCH ULTRA SYSTEM KIT W/DEVICE Kit  1 kit by Does not apply route once. Tests Blood sugar before meals and at bedtime     potassium chloride SA 20 MEQ tablet  Commonly known as:  K-DUR,KLOR-CON  TAKE 1 TABLET BY MOUTH TWICE DAILY     pyridOXINE 100 MG tablet  Commonly known as:  VITAMIN B-6  Take 100 mg by mouth daily.        Allergies:  Allergies  Allergen Reactions  .  Lipitor [Atorvastatin Calcium] Diarrhea  . Niacin And Related Hives  . Statins Diarrhea  . Macrobid [Nitrofurantoin Macrocrystal] Other (See Comments)    unknown     Past Medical History  Diagnosis Date  . Diabetes mellitus   . Hypertension   . Dyslipidemia   . Varicose veins   . Migraines   . GERD (gastroesophageal reflux disease)   . RSD (reflex sympathetic dystrophy) 2007    R wrist/hand following fx  . Anemia     Past Surgical History  Procedure Laterality Date  . Umbilical hernia repair    . Breast cyst excision    . Tubal ligation    . Cholecystectomy  1989  . Appendectomy  1966  . Abdominal hysterectomy  1988  . Several benign cyst removed      last 1 in 1972  . Cataract extraction  10/2009    Family History  Problem Relation Age of Onset  . Stroke Mother 60  . Hypertension Mother   . Clotting disorder Father   . Heart attack Father   . Arrhythmia Sister   . Stroke Brother   . Diabetes Neg Hx   . Thyroid disease Neg Hx     Social History:  reports that she has never smoked. She has never used smokeless tobacco. She reports that she does not drink alcohol or use illicit drugs.    Review of Systems   Most recent eye exam was 10/15       Lipids:        Lab Results  Component Value Date   CHOL 195 07/24/2014   HDL 30* 07/24/2014   LDLCALC NOT CALC 07/24/2014   LDLDIRECT 41.8 09/13/2011   TRIG 485* 07/24/2014   CHOLHDL 6.5 07/24/2014                   Thyroid:   Has complaints of significant fatigue On exam has a 3 cm nodule on the right side but she has refused to consider biopsy  Lab Results  Component Value Date   TSH 2.83 10/04/2014       The blood pressure has been managed with losartan and metoprolol as well as amlodipine 5 mg         She has a history of Numbness in her feet and toes, mostly later in the day but better overnight.  She has had the symptoms over several months. Last foot exam was in 2/16 showing sensory loss   LABS:  Office Visit on 11/28/2014  Component Date Value Ref Range Status  . Hgb A1c MFr Bld 11/28/2014 8.7* 4.6 - 6.5 % Final   Glycemic Control Guidelines for People  with Diabetes:Non Diabetic:  <6%Goal of Therapy: <7%Additional Action Suggested:  >8%   . Sodium 11/28/2014 138  135 - 145 mEq/L Final  . Potassium 11/28/2014 4.4  3.5 - 5.1 mEq/L Final  . Chloride 11/28/2014 105  96 - 112 mEq/L Final  . CO2 11/28/2014 25  19 - 32 mEq/L Final  . Glucose, Bld 11/28/2014 167* 70 - 99 mg/dL Final  . BUN 11/28/2014 34* 6 - 23 mg/dL Final  . Creatinine, Ser 11/28/2014 1.19  0.40 - 1.20 mg/dL Final  . Calcium 11/28/2014 9.8  8.4 - 10.5 mg/dL Final  . GFR 11/28/2014 45.99* >60.00 mL/min Final    Physical Examination:  BP 118/64 mmHg  Pulse 64  Temp(Src) 98.5 F (36.9 C)  Resp 14  Ht _0  (1.626 m)  Wt 154 lb  12.8 oz (70.217 kg)  BMI 26.56 kg/m2  SpO2 95%     ASSESSMENT /PLAN:  Diabetes type 2, uncontrolled    See history of present illness for detailed discussion of her current management and problems identified as well as blood sugar patterns  Her blood sugars are poorly controlled with consistently high postprandial readings Blood sugar is averaging about 250 at night Also some morning readings are significantly high also especially if high the night before She still does not understand the need to cover her meals with NovoLog insulin even though her husband does this consistently  Again explained to her the need to cover mealtimes spikes in blood sugars since she is insulin deficient and the fact that Lantus will not cover this She will likely start taking NovoLog at suppertime and consider doing this at breakfast and lunch especially if she has high postprandial readings Also discussed having balanced meals; she does not always have protein at breakfast and may need consistent coverage at breakfast if eating some carbohydrate also She had been interested in seeing the dietitian but is unable to do so because of her home situation  Discussed timing of glucose monitoring, blood sugar targets and adjustment of mealtime insulin She will continue  Lantus unchanged for now unless fasting readings are getting relatively higher or lower  Patient Instructions  Please check blood sugars at least half the time about 2 hours after any meal and 3 times per week on waking up.  Please bring blood sugar monitor to each visit. Recommended blood sugar levels about 2 hours after meal is 140-180 and on waking up 90-130  Must take 5-7 units Novolog  At each meal unless getting no Carbs       Counseling time over 50% of today's 25 minute visit  Lexi Conaty 11/28/2014, 4:32 PM   Note: This office note was prepared with Dragon voice recognition system technology. Any transcriptional errors that result from this process are unintentional.

## 2014-11-28 NOTE — Progress Notes (Signed)
Quick Note:  Please let patient know that the A1c is not any better, still 8.7, needs to take NovoLog insulin with all meals as directed  ______

## 2014-11-28 NOTE — Patient Instructions (Signed)
Please check blood sugars at least half the time about 2 hours after any meal and 3 times per week on waking up.  Please bring blood sugar monitor to each visit. Recommended blood sugar levels about 2 hours after meal is 140-180 and on waking up 90-130  Must take 5-7 units Novolog  At each meal unless getting no Carbs

## 2014-12-05 ENCOUNTER — Encounter: Payer: Self-pay | Admitting: Family Medicine

## 2014-12-05 ENCOUNTER — Other Ambulatory Visit: Payer: Self-pay | Admitting: Endocrinology

## 2014-12-05 ENCOUNTER — Other Ambulatory Visit: Payer: Self-pay | Admitting: Family Medicine

## 2014-12-05 NOTE — Telephone Encounter (Signed)
Refill appropriate and filled per protocol. 

## 2014-12-07 ENCOUNTER — Other Ambulatory Visit: Payer: Self-pay | Admitting: Family Medicine

## 2015-01-04 ENCOUNTER — Other Ambulatory Visit: Payer: Self-pay | Admitting: Family Medicine

## 2015-01-08 ENCOUNTER — Other Ambulatory Visit: Payer: Self-pay | Admitting: Family Medicine

## 2015-01-08 DIAGNOSIS — I6523 Occlusion and stenosis of bilateral carotid arteries: Secondary | ICD-10-CM

## 2015-01-12 ENCOUNTER — Other Ambulatory Visit: Payer: Self-pay | Admitting: Family Medicine

## 2015-01-14 ENCOUNTER — Other Ambulatory Visit: Payer: Self-pay | Admitting: Family Medicine

## 2015-01-14 ENCOUNTER — Ambulatory Visit (HOSPITAL_COMMUNITY): Payer: Medicare Other | Attending: Cardiology

## 2015-01-14 DIAGNOSIS — I6523 Occlusion and stenosis of bilateral carotid arteries: Secondary | ICD-10-CM | POA: Diagnosis not present

## 2015-01-14 NOTE — Telephone Encounter (Signed)
Refill appropriate and filled per protocol. 

## 2015-01-16 ENCOUNTER — Encounter: Payer: Self-pay | Admitting: Family Medicine

## 2015-01-16 ENCOUNTER — Ambulatory Visit (INDEPENDENT_AMBULATORY_CARE_PROVIDER_SITE_OTHER): Payer: Medicare Other | Admitting: Family Medicine

## 2015-01-16 VITALS — BP 118/64 | HR 58 | Temp 98.2°F | Resp 16 | Ht 64.0 in | Wt 154.0 lb

## 2015-01-16 DIAGNOSIS — R42 Dizziness and giddiness: Secondary | ICD-10-CM

## 2015-01-16 NOTE — Progress Notes (Signed)
Subjective:    Patient ID: Rebecca Perkins, female    DOB: Apr 28, 1929, 81 y.o.   MRN: [id number]  HPI  Patient reports one year of episodic dizziness. The dizziness sounds more like disequilibrium. She feels lightheaded. There is a rocking sensation to her surroundings. She denies any true vertigo. She denies any vision changes. She denies any diplopia. She denies any severe headache. She denies any neurologic deficits. She denies any slurring speech or numbness or paralysis or weakness in extremity. She denies any head trauma. She denies any tinnitus or hearing loss.  The dizziness occurs and lasts for several minutes. There are no exacerbating or alleviating factors although it does seem to happen more often when she standing. Recently at home her blood pressures have been low in the door will. She attributes the dizziness to this. Past Medical History  Diagnosis Date  . Diabetes mellitus   . Hypertension   . Dyslipidemia   . Varicose veins   . Migraines   . GERD (gastroesophageal reflux disease)   . RSD (reflex sympathetic dystrophy) 2007    R wrist/hand following fx  . Anemia    Past Surgical History  Procedure Laterality Date  . Umbilical hernia repair    . Breast cyst excision    . Tubal ligation    . Cholecystectomy  1989  . Appendectomy  1966  . Abdominal hysterectomy  1988  . Several benign cyst removed      last 1 in 1972  . Cataract extraction  10/2009   Current Outpatient Prescriptions on File Prior to Visit  Medication Sig Dispense Refill  . amLODipine (NORVASC) 5 MG tablet TAKE 1 TABLET BY MOUTH EVERY DAY. 30 tablet 11  . aspirin EC 81 MG tablet Take 81 mg by mouth daily.    . B-D ULTRAFINE III SHORT PEN 31G X 8 MM MISC USE AS DIRECTED 100 each 5  . Blood Glucose Monitoring Suppl (ONE TOUCH ULTRA SYSTEM KIT) W/DEVICE KIT 1 kit by Does not apply route once. Tests Blood sugar before meals and at bedtime 1 each 0  . Calcium Carbonate-Vitamin D (CALTRATE 600+D PO)  Take 1 tablet by mouth 2 (two) times daily.    . ferrous sulfate 325 (65 FE) MG EC tablet Take 1 tablet (325 mg total) by mouth 3 (three) times daily with meals. 90 tablet 11  . fish oil-omega-3 fatty acids 1000 MG capsule Take 2 g by mouth 2 (two) times daily.      . furosemide (LASIX) 40 MG tablet TAKE 1 TABLET BY MOUTH DAILY 30 tablet 5  . gemfibrozil (LOPID) 600 MG tablet TAKE 1 TABLET BY MOUTH TWICE DAILY BEFORE A MEAL 60 tablet 5  . glucose blood test strip 1 each by Other route 4 (four) times daily -  before meals and at bedtime. Use as instructed 150 each 12  . insulin aspart (NOVOLOG) 100 UNIT/ML injection Inject 5U SQ with breakfast, 7U SQ with lunch and 7U SQ with dinner. 3 vial PRN  . INSULIN SYRINGE 1CC/29G 29G X 1/2" 1 ML MISC   6  . INSULIN SYRINGE 1CC/29G 29G X 1/2" 1 ML MISC USE AS DIRECTED. 100 each 5  . LANTUS 100 UNIT/ML injection INJECT 64 UNITS UNDER THE SKIN EVERY DAY. (Patient taking differently: INJECT 50 UNITS UNDER THE SKIN EVERY DAY.) 20 mL 3  . losartan (COZAAR) 100 MG tablet TAKE 1 TABLET BY MOUTH EVERY DAY 30 tablet 5  . metoprolol (LOPRESSOR) 50 MG  tablet TAKE 1 TABLET BY MOUTH TWICE DAILY (Patient taking differently: TAKE 1/2 TABLET  BY MOUTH TWICE DAILY) 180 tablet 0  . Multiple Vitamin (MULTIVITAMIN) tablet Take 1 tablet by mouth daily.      Marland Kitchen omega-3 acid ethyl esters (LOVAZA) 1 G capsule     . omega-3 acid ethyl esters (LOVAZA) 1 G capsule TAKE 2 CAPSULES BY MOUTH DAILY 60 capsule 3  . omeprazole (PRILOSEC) 20 MG capsule TAKE 1 CAPSULE BY MOUTH EVERY DAY 30 capsule 11  . potassium chloride SA (K-DUR,KLOR-CON) 20 MEQ tablet TAKE 1 TABLET BY MOUTH TWICE DAILY 180 tablet 0  . potassium chloride SA (K-DUR,KLOR-CON) 20 MEQ tablet TAKE 1 TABLET BY MOUTH TWICE DAILY 180 tablet 3  . Pyridoxine HCl (VITAMIN B-6) 100 MG tablet Take 100 mg by mouth daily.       No current facility-administered medications on file prior to visit.   Allergies  Allergen Reactions  .  Lipitor [Atorvastatin Calcium] Diarrhea  . Niacin And Related Hives  . Statins Diarrhea  . Macrobid [Nitrofurantoin Macrocrystal] Other (See Comments)    unknown   History   Social History  . Marital Status: Married    Spouse Name: N/A  . Number of Children: 3  . Years of Education: N/A   Occupational History  . Retired    Social History Main Topics  . Smoking status: Never Smoker   . Smokeless tobacco: Never Used  . Alcohol Use: No  . Drug Use: No  . Sexual Activity: Not on file   Other Topics Concern  . 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      Objective:   Physical Exam  Constitutional: She is oriented to person, place, and time. She appears well-developed and well-nourished.  HENT:  Right Ear: Tympanic membrane and ear canal normal.  Left Ear: Tympanic membrane and ear canal normal.  Cardiovascular: Normal rate, regular rhythm and normal heart sounds.   No murmur heard. Pulmonary/Chest: Effort normal and breath sounds normal. No respiratory distress. She has no wheezes. She has no rales.  Abdominal: Soft. Bowel sounds are normal. She exhibits no distension. There is no tenderness. There is no rebound and no guarding.  Neurological: She is alert and oriented to person, place, and time. She has normal reflexes. She displays normal reflexes. No cranial nerve deficit. She exhibits normal muscle tone. Coordination normal.  Vitals reviewed.         Assessment & Plan:  Dizziness  I explained to the patient that this is likely multifactorial and related to age, peripheral neuropathy, low blood pressure, medications. I doubt serious intracranial pathology given her history and are normally review of systems. I believe the likelihood of a intracranial mass or CVA is  very low. There are no symptoms of syncope. I have asked the patient to temporarily discontinue amlodipine to raise her blood pressure and then recheck in 2 weeks. If her symptoms are worsening or not improving, at that time we can check a head CT

## 2015-01-31 ENCOUNTER — Other Ambulatory Visit: Payer: Self-pay | Admitting: Family Medicine

## 2015-01-31 MED ORDER — AMLODIPINE BESYLATE 5 MG PO TABS: 5.0000 mg | ORAL_TABLET | Freq: Every day | ORAL | Status: DC

## 2015-01-31 NOTE — Telephone Encounter (Signed)
Medication refilled per protocol. 

## 2015-02-06 ENCOUNTER — Encounter: Payer: Self-pay | Admitting: Family Medicine

## 2015-02-06 ENCOUNTER — Ambulatory Visit (INDEPENDENT_AMBULATORY_CARE_PROVIDER_SITE_OTHER): Payer: Medicare Other | Admitting: Family Medicine

## 2015-02-06 VITALS — BP 126/68 | HR 60 | Temp 98.4°F | Resp 18 | Ht 64.0 in | Wt 153.0 lb

## 2015-02-06 DIAGNOSIS — R42 Dizziness and giddiness: Secondary | ICD-10-CM

## 2015-02-06 DIAGNOSIS — M810 Age-related osteoporosis without current pathological fracture: Secondary | ICD-10-CM | POA: Diagnosis not present

## 2015-02-06 NOTE — Progress Notes (Signed)
Subjective:    Patient ID: Rebecca Perkins, female    DOB: [birth year], 81 y.o.   MRN: [id number]  HPI  01/16/15 Patient reports one year of episodic dizziness. The dizziness sounds more like disequilibrium. She feels lightheaded. There is a rocking sensation to her surroundings. She denies any true vertigo. She denies any vision changes. She denies any diplopia. She denies any severe headache. She denies any neurologic deficits. She denies any slurring speech or numbness or paralysis or weakness in extremity. She denies any head trauma. She denies any tinnitus or hearing loss.  The dizziness occurs and lasts for several minutes. There are no exacerbating or alleviating factors although it does seem to happen more often when she standing. Recently at home her blood pressures have been low in the door will. She attributes the dizziness to this.  At that time my plan was: I explained to the patient that this is likely multifactorial and related to age, peripheral neuropathy, low blood pressure, medications. I doubt serious intracranial pathology given her history and are normally review of systems. I believe the likelihood of a intracranial mass or CVA is very low. There are no symptoms of syncope. I have asked the patient to temporarily discontinue amlodipine to raise her blood pressure and then recheck in 2 weeks. If her symptoms are worsening or not improving, at that time we can check a head CT  02/06/15 Patient is here for follow up. Seems to be doing better since discontinuing the amlodipine. Her blood pressure has been ranging 110-130/60-80. This is off amlodipine. The dizziness has not been as significant. She is not falling. She does occasionally have some vertigo with rapid turning of her head which is also contributing. At the present time she is not interested in a CT scan or MRI. She is also not interested in physical therapy for the dizziness. She does have a history of osteoporosis with a T  score of -2.5 in her left forearm. She is overdue to recheck a bone density test Past Medical History  Diagnosis Date  . Diabetes mellitus   . Hypertension   . Dyslipidemia   . Varicose veins   . Migraines   . GERD (gastroesophageal reflux disease)   . RSD (reflex sympathetic dystrophy) 2007    R wrist/hand following fx  . Anemia    Past Surgical History  Procedure Laterality Date  . Umbilical hernia repair    . Breast cyst excision    . Tubal ligation    . Cholecystectomy  1989  . Appendectomy  1966  . Abdominal hysterectomy  1988  . Several benign cyst removed      last 1 in 1972  . Cataract extraction  10/2009   Current Outpatient Prescriptions on File Prior to Visit  Medication Sig Dispense Refill  . amLODipine (NORVASC) 5 MG tablet Take 1 tablet (5 mg total) by mouth daily. 30 tablet 2  . aspirin EC 81 MG tablet Take 81 mg by mouth daily.    . B-D ULTRAFINE III SHORT PEN 31G X 8 MM MISC USE AS DIRECTED 100 each 5  . Blood Glucose Monitoring Suppl (ONE TOUCH ULTRA SYSTEM KIT) W/DEVICE KIT 1 kit by Does not apply route once. Tests Blood sugar before meals and at bedtime 1 each 0  . Calcium Carbonate-Vitamin D (CALTRATE 600+D PO) Take 1 tablet by mouth 2 (two) times daily.    . ferrous sulfate 325 (65 FE) MG EC tablet Take 1 tablet (325  mg total) by mouth 3 (three) times daily with meals. 90 tablet 11  . fish oil-omega-3 fatty acids 1000 MG capsule Take 2 g by mouth 2 (two) times daily.      . furosemide (LASIX) 40 MG tablet TAKE 1 TABLET BY MOUTH DAILY 30 tablet 5  . gemfibrozil (LOPID) 600 MG tablet TAKE 1 TABLET BY MOUTH TWICE DAILY BEFORE A MEAL 60 tablet 5  . glucose blood test strip 1 each by Other route 4 (four) times daily -  before meals and at bedtime. Use as instructed 150 each 12  . insulin aspart (NOVOLOG) 100 UNIT/ML injection Inject 5U SQ with breakfast, 7U SQ with lunch and 7U SQ with dinner. 3 vial PRN  . INSULIN SYRINGE 1CC/29G 29G X 1/2" 1 ML MISC   6  .  INSULIN SYRINGE 1CC/29G 29G X 1/2" 1 ML MISC USE AS DIRECTED. 100 each 5  . LANTUS 100 UNIT/ML injection INJECT 64 UNITS UNDER THE SKIN EVERY DAY. (Patient taking differently: INJECT 50 UNITS UNDER THE SKIN EVERY DAY.) 20 mL 3  . losartan (COZAAR) 100 MG tablet TAKE 1 TABLET BY MOUTH EVERY DAY 30 tablet 5  . metoprolol (LOPRESSOR) 50 MG tablet TAKE 1 TABLET BY MOUTH TWICE DAILY (Patient taking differently: TAKE 1/2 TABLET  BY MOUTH TWICE DAILY) 180 tablet 0  . Multiple Vitamin (MULTIVITAMIN) tablet Take 1 tablet by mouth daily.      Marland Kitchen omega-3 acid ethyl esters (LOVAZA) 1 G capsule     . omega-3 acid ethyl esters (LOVAZA) 1 G capsule TAKE 2 CAPSULES BY MOUTH DAILY 60 capsule 3  . omeprazole (PRILOSEC) 20 MG capsule TAKE 1 CAPSULE BY MOUTH EVERY DAY 30 capsule 11  . potassium chloride SA (K-DUR,KLOR-CON) 20 MEQ tablet TAKE 1 TABLET BY MOUTH TWICE DAILY 180 tablet 0  . potassium chloride SA (K-DUR,KLOR-CON) 20 MEQ tablet TAKE 1 TABLET BY MOUTH TWICE DAILY 180 tablet 3  . Pyridoxine HCl (VITAMIN B-6) 100 MG tablet Take 100 mg by mouth daily.       No current facility-administered medications on file prior to visit.   Allergies  Allergen Reactions  . Lipitor [Atorvastatin Calcium] Diarrhea  . Niacin And Related Hives  . Statins Diarrhea  . Macrobid [Nitrofurantoin Macrocrystal] Other (See Comments)    unknown   History   Social History  . Marital Status: Married    Spouse Name: N/A  . Number of Children: 3  . Years of Education: N/A   Occupational History  . Retired    Social History Main Topics  . Smoking status: Never Smoker   . Smokeless tobacco: Never Used  . Alcohol Use: No  . Drug Use: No  . Sexual Activity: Not on file   Other Topics Concern  . 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      Objective:   Physical Exam  Constitutional: She is oriented to person, place, and time. She appears well-developed and well-nourished.  HENT:  Right Ear: Tympanic membrane and ear canal normal.  Left Ear: Tympanic membrane and ear canal normal.  Cardiovascular: Normal rate, regular rhythm and normal heart sounds.   No murmur heard. Pulmonary/Chest: Effort normal and breath sounds normal. No respiratory distress. She has no wheezes. She has  no rales.  Abdominal: Soft. Bowel sounds are normal. She exhibits no distension. There is no tenderness. There is no rebound and no guarding.  Neurological: She is alert and oriented to person, place, and time. She has normal reflexes. No cranial nerve deficit. She exhibits normal muscle tone. Coordination normal.  Vitals reviewed.         Assessment & Plan:  Dizziness  Osteoporosis - Plan: DG Bone Density  ddizziness is multifactorial and improved. Continue to refrain from amlodipine. If the dizziness worsens, I would recommend physical therapy through [facility] neurology. I will schedule the patient for a bone density test to monitor her osteoporosis and determine if she would benefit from bisphosphonate therapy

## 2015-02-18 ENCOUNTER — Encounter: Payer: Self-pay | Admitting: Family Medicine

## 2015-02-18 DIAGNOSIS — M810 Age-related osteoporosis without current pathological fracture: Secondary | ICD-10-CM | POA: Insufficient documentation

## 2015-02-19 ENCOUNTER — Other Ambulatory Visit: Payer: Self-pay | Admitting: *Deleted

## 2015-02-19 MED ORDER — INSULIN ASPART 100 UNIT/ML FLEXPEN
PEN_INJECTOR | SUBCUTANEOUS | Status: DC
Start: 2015-02-19 — End: 2015-08-26

## 2015-02-20 ENCOUNTER — Encounter: Payer: Self-pay | Admitting: Family Medicine

## 2015-02-26 ENCOUNTER — Ambulatory Visit
Admission: RE | Admit: 2015-02-26 | Discharge: 2015-02-26 | Disposition: A | Discharge status: Home / Self Care | Payer: Medicare Other | Source: Ambulatory Visit | Attending: Family Medicine | Admitting: Family Medicine

## 2015-02-26 DIAGNOSIS — M810 Age-related osteoporosis without current pathological fracture: Secondary | ICD-10-CM

## 2015-03-24 LAB — HM DIABETES EYE EXAM

## 2015-04-14 ENCOUNTER — Telehealth: Payer: Self-pay | Admitting: Endocrinology

## 2015-04-14 NOTE — Telephone Encounter (Signed)
Levada Dy nurse working with pt in diabetes management programs can you please call her back @ [phone] ext [phone]

## 2015-05-05 ENCOUNTER — Encounter: Payer: Self-pay | Admitting: Physician Assistant

## 2015-05-05 ENCOUNTER — Ambulatory Visit (INDEPENDENT_AMBULATORY_CARE_PROVIDER_SITE_OTHER): Payer: Medicare Other | Admitting: Physician Assistant

## 2015-05-05 ENCOUNTER — Telehealth: Payer: Self-pay | Admitting: Family Medicine

## 2015-05-05 VITALS — BP 132/70 | HR 60 | Temp 98.0°F | Resp 18 | Wt 151.0 lb

## 2015-05-05 DIAGNOSIS — N3090 Cystitis, unspecified without hematuria: Secondary | ICD-10-CM | POA: Diagnosis not present

## 2015-05-05 DIAGNOSIS — R300 Dysuria: Secondary | ICD-10-CM | POA: Diagnosis not present

## 2015-05-05 LAB — URINALYSIS, ROUTINE W REFLEX MICROSCOPIC
Bilirubin Urine: NEGATIVE
Glucose, UA: NEGATIVE
Ketones, ur: NEGATIVE
Nitrite: POSITIVE — AB
Specific Gravity, Urine: 1.02 (ref 1.001–1.035)
pH: 5 (ref 5.0–8.0)

## 2015-05-05 LAB — URINALYSIS, MICROSCOPIC ONLY
Casts: NONE SEEN [LPF]
Crystals: NONE SEEN [HPF]
Yeast: NONE SEEN [HPF]

## 2015-05-05 MED ORDER — CIPROFLOXACIN HCL 500 MG PO TABS: 500.0000 mg | ORAL_TABLET | Freq: Two times a day (BID) | ORAL | Status: DC

## 2015-05-05 NOTE — Progress Notes (Signed)
Patient ID: Rebecca Perkins MRN: [id number], DOB: [birth year], 81 y.o. Date of Encounter: 05/05/2015, 11:41 AM    Chief Complaint:  Chief Complaint  Patient presents with  . c/o poss UTI    has dysuria, back pain     HPI: 81 y.o. year old white female states that she hasn't been having any severe symptoms. Says that she has been feeling just a little achy around both sides of her low back. Says that just in the last couple days she's been having some very minimal dysuria. Says it hasn't been very bad. Says that she hasn't had any pain up at the costophrenic angle region/kidney region. Has had no fevers or chills.      Home Meds:   Outpatient Prescriptions Prior to Visit  Medication Sig Dispense Refill  . aspirin EC 81 MG tablet Take 81 mg by mouth daily.    . B-D ULTRAFINE III SHORT PEN 31G X 8 MM MISC USE AS DIRECTED 100 each 5  . Blood Glucose Monitoring Suppl (ONE TOUCH ULTRA SYSTEM KIT) W/DEVICE KIT 1 kit by Does not apply route once. Tests Blood sugar before meals and at bedtime 1 each 0  . Calcium Carbonate-Vitamin D (CALTRATE 600+D PO) Take 1 tablet by mouth 2 (two) times daily.    . ferrous sulfate 325 (65 FE) MG EC tablet Take 1 tablet (325 mg total) by mouth 3 (three) times daily with meals. 90 tablet 11  . fish oil-omega-3 fatty acids 1000 MG capsule Take 2 g by mouth 2 (two) times daily.      . furosemide (LASIX) 40 MG tablet TAKE 1 TABLET BY MOUTH DAILY 30 tablet 5  . gemfibrozil (LOPID) 600 MG tablet TAKE 1 TABLET BY MOUTH TWICE DAILY BEFORE A MEAL 60 tablet 5  . glucose blood test strip 1 each by Other route 4 (four) times daily -  before meals and at bedtime. Use as instructed 150 each 12  . insulin aspart (NOVOLOG FLEXPEN) 100 UNIT/ML FlexPen Inject 5 units at breakfast  and 7 units at lunch and supper 15 mL 3  . INSULIN SYRINGE 1CC/29G 29G X 1/2" 1 ML MISC   6  . INSULIN SYRINGE 1CC/29G 29G X 1/2" 1 ML MISC USE AS DIRECTED. 100 each 5  . LANTUS 100 UNIT/ML  injection INJECT 64 UNITS UNDER THE SKIN EVERY DAY. (Patient taking differently: INJECT 50 UNITS UNDER THE SKIN EVERY DAY.) 20 mL 3  . losartan (COZAAR) 100 MG tablet TAKE 1 TABLET BY MOUTH EVERY DAY 30 tablet 5  . metoprolol (LOPRESSOR) 50 MG tablet TAKE 1 TABLET BY MOUTH TWICE DAILY (Patient taking differently: TAKE 1/2 TABLET  BY MOUTH TWICE DAILY) 180 tablet 0  . Multiple Vitamin (MULTIVITAMIN) tablet Take 1 tablet by mouth daily.      Marland Kitchen omega-3 acid ethyl esters (LOVAZA) 1 G capsule     . omega-3 acid ethyl esters (LOVAZA) 1 G capsule TAKE 2 CAPSULES BY MOUTH DAILY 60 capsule 3  . omeprazole (PRILOSEC) 20 MG capsule TAKE 1 CAPSULE BY MOUTH EVERY DAY 30 capsule 11  . potassium chloride SA (K-DUR,KLOR-CON) 20 MEQ tablet TAKE 1 TABLET BY MOUTH TWICE DAILY 180 tablet 0  . Pyridoxine HCl (VITAMIN B-6) 100 MG tablet Take 100 mg by mouth daily.      . potassium chloride SA (K-DUR,KLOR-CON) 20 MEQ tablet TAKE 1 TABLET BY MOUTH TWICE DAILY 180 tablet 3   No facility-administered medications prior to visit.  Allergies:  Allergies  Allergen Reactions  . Lipitor [Atorvastatin Calcium] Diarrhea  . Niacin And Related Hives  . Statins Diarrhea  . Macrobid [Nitrofurantoin Macrocrystal] Other (See Comments)    unknown      Review of Systems: See HPI for pertinent ROS. All other ROS negative.    Physical Exam: Blood pressure 132/70, pulse 60, temperature 98 F (36.7 C), temperature source Oral, resp. rate 18, weight 151 lb (68.493 kg)., Body mass index is 25.91 kg/(m^2). General:  WNWD WF. Appears in no acute distress. Neck: Supple. No thyromegaly. No lymphadenopathy. Lungs: Clear bilaterally to auscultation without wheezes, rales, or rhonchi. Breathing is unlabored. Heart: Regular rhythm. No murmurs, rubs, or gallops. Abdomen: Soft, non-tender, non-distended with normoactive bowel sounds. No hepatomegaly. No rebound/guarding. No obvious abdominal masses. Minimal tenderness with palpation of  suprapubic region/midline low abdomen.  Msk:  Strength and tone normal for age. No tenderness with percussion of costophrenic angles bilaterally. She points to low back bilaterally as areas that she has felt some minimal discomfort. Extremities/Skin: Warm and dry.  Neuro: Alert and oriented X 3. Moves all extremities spontaneously. Gait is normal. CNII-XII grossly in tact. Psych:  Responds to questions appropriately with a normal affect.   Results for orders placed or performed in visit on 05/05/15  Urinalysis, Routine w reflex microscopic (not at [facility])  Result Value Ref Range   Color, Urine YELLOW YELLOW   APPearance CLOUDY (A) CLEAR   Specific Gravity, Urine 1.020 1.001 - 1.035   pH 5.0 5.0 - 8.0   Glucose, UA NEGATIVE NEGATIVE   Bilirubin Urine NEGATIVE NEGATIVE   Ketones, ur NEGATIVE NEGATIVE   Hgb urine dipstick 1+ (A) NEGATIVE   Protein, ur 1+ (A) NEGATIVE   Nitrite POSITIVE (A) NEGATIVE   Leukocytes, UA 3+ (A) NEGATIVE  Urine Microscopic  Result Value Ref Range   WBC, UA 40-60 (A) <=5 WBC/HPF   RBC / HPF 3-10 (A) <=2 RBC/HPF   Squamous Epithelial / LPF 0-5 <=5 HPF   Bacteria, UA MODERATE (A) NONE SEEN HPF   Crystals NONE SEEN NONE SEEN HPF   Casts NONE SEEN NONE SEEN LPF   Yeast NONE SEEN NONE SEEN HPF     ASSESSMENT AND PLAN:  81 y.o. year old female with  1. Cystitis She does have allergy to Macrobid. Given age 81, will send culture for sensitivities. She is to go ahead and start Cipro immediately take as directed and complete all of it. - ciprofloxacin (CIPRO) 500 MG tablet; Take 1 tablet (500 mg total) by mouth 2 (two) times daily.  Dispense: 14 tablet; Refill: 0 - Urine culture  2. Dysuria - Urinalysis, Routine w reflex microscopic (not at [facility])   Signed, [facility] [place], [place], [facility] 05/05/2015 11:41 AM

## 2015-05-05 NOTE — Telephone Encounter (Signed)
PATIENT WOULD LIKE TO SPEAK WITH YOU REGARDING THE SIDE EFFECTS OF THE MEDICATION THAT WAS PRESCRIBED FOR HER THIS MORNING  [phone]

## 2015-05-06 ENCOUNTER — Encounter: Payer: Self-pay | Admitting: Family Medicine

## 2015-05-06 NOTE — Telephone Encounter (Signed)
Pt concerned about long term side effects.  Told her she will be fine as she is only taking short term.  If feels she is having side effect to call and let us know.

## 2015-05-07 LAB — URINE CULTURE: Colony Count: >=100000

## 2015-05-07 NOTE — Telephone Encounter (Signed)
What about the Cipro is she concerned about? I recommend she go ahead and start antibiotic immediately before this infection causes major problems.  Culture results still pending---I will f/u with her once I get those results.

## 2015-05-30 ENCOUNTER — Ambulatory Visit: Payer: Medicare Other | Admitting: Endocrinology

## 2015-06-04 ENCOUNTER — Other Ambulatory Visit: Payer: Self-pay

## 2015-06-04 DIAGNOSIS — Z1231 Encounter for screening mammogram for malignant neoplasm of breast: Secondary | ICD-10-CM

## 2015-06-12 ENCOUNTER — Other Ambulatory Visit: Payer: Self-pay | Admitting: Family Medicine

## 2015-06-17 ENCOUNTER — Observation Stay (HOSPITAL_COMMUNITY)
Admission: EM | Admit: 2015-06-17 | Discharge: 2015-06-19 | Disposition: A | Discharge status: Home / Self Care | Payer: Medicare Other | Attending: Internal Medicine | Admitting: Internal Medicine

## 2015-06-17 ENCOUNTER — Emergency Department (HOSPITAL_COMMUNITY): Payer: Medicare Other

## 2015-06-17 ENCOUNTER — Observation Stay (HOSPITAL_COMMUNITY): Payer: Medicare Other

## 2015-06-17 ENCOUNTER — Encounter (HOSPITAL_COMMUNITY): Payer: Self-pay | Admitting: Emergency Medicine

## 2015-06-17 DIAGNOSIS — E1165 Type 2 diabetes mellitus with hyperglycemia: Secondary | ICD-10-CM | POA: Diagnosis not present

## 2015-06-17 DIAGNOSIS — Z7982 Long term (current) use of aspirin: Secondary | ICD-10-CM | POA: Insufficient documentation

## 2015-06-17 DIAGNOSIS — IMO0002 Reserved for concepts with insufficient information to code with codable children: Secondary | ICD-10-CM | POA: Diagnosis present

## 2015-06-17 DIAGNOSIS — Z79899 Other long term (current) drug therapy: Secondary | ICD-10-CM | POA: Diagnosis not present

## 2015-06-17 DIAGNOSIS — E785 Hyperlipidemia, unspecified: Secondary | ICD-10-CM | POA: Diagnosis present

## 2015-06-17 DIAGNOSIS — I10 Essential (primary) hypertension: Secondary | ICD-10-CM | POA: Diagnosis not present

## 2015-06-17 DIAGNOSIS — Z794 Long term (current) use of insulin: Secondary | ICD-10-CM | POA: Insufficient documentation

## 2015-06-17 DIAGNOSIS — R4781 Slurred speech: Secondary | ICD-10-CM | POA: Diagnosis present

## 2015-06-17 DIAGNOSIS — E119 Type 2 diabetes mellitus without complications: Secondary | ICD-10-CM

## 2015-06-17 DIAGNOSIS — D649 Anemia, unspecified: Secondary | ICD-10-CM

## 2015-06-17 DIAGNOSIS — Z881 Allergy status to other antibiotic agents status: Secondary | ICD-10-CM | POA: Diagnosis not present

## 2015-06-17 DIAGNOSIS — G459 Transient cerebral ischemic attack, unspecified: Secondary | ICD-10-CM | POA: Diagnosis not present

## 2015-06-17 DIAGNOSIS — E781 Pure hyperglyceridemia: Secondary | ICD-10-CM

## 2015-06-17 DIAGNOSIS — Z8249 Family history of ischemic heart disease and other diseases of the circulatory system: Secondary | ICD-10-CM | POA: Insufficient documentation

## 2015-06-17 DIAGNOSIS — G451 Carotid artery syndrome (hemispheric): Secondary | ICD-10-CM | POA: Diagnosis not present

## 2015-06-17 DIAGNOSIS — Z888 Allergy status to other drugs, medicaments and biological substances status: Secondary | ICD-10-CM | POA: Insufficient documentation

## 2015-06-17 DIAGNOSIS — M810 Age-related osteoporosis without current pathological fracture: Secondary | ICD-10-CM

## 2015-06-17 DIAGNOSIS — IMO0001 Reserved for inherently not codable concepts without codable children: Secondary | ICD-10-CM

## 2015-06-17 DIAGNOSIS — K219 Gastro-esophageal reflux disease without esophagitis: Secondary | ICD-10-CM | POA: Insufficient documentation

## 2015-06-17 LAB — I-STAT CHEM 8, ED
BUN: 24 mg/dL — ABNORMAL HIGH (ref 6–20)
Calcium, Ion: 1.21 mmol/L (ref 1.13–1.30)
Chloride: 106 mmol/L (ref 101–111)
Creatinine, Ser: 1.1 mg/dL — ABNORMAL HIGH (ref 0.44–1.00)
Glucose, Bld: 138 mg/dL — ABNORMAL HIGH (ref 65–99)
HCT: 35 % — ABNORMAL LOW (ref 36.0–46.0)
Hemoglobin: 11.9 g/dL — ABNORMAL LOW (ref 12.0–15.0)
Potassium: 4.1 mmol/L (ref 3.5–5.1)
Sodium: 141 mmol/L (ref 135–145)
TCO2: 26 mmol/L (ref 0–100)

## 2015-06-17 LAB — GLUCOSE, CAPILLARY
Glucose-Capillary: 129 mg/dL — ABNORMAL HIGH (ref 65–99)
Glucose-Capillary: 223 mg/dL — ABNORMAL HIGH (ref 65–99)

## 2015-06-17 LAB — COMPREHENSIVE METABOLIC PANEL
ALT: 18 U/L (ref 14–54)
AST: 25 U/L (ref 15–41)
Albumin: 3.7 g/dL (ref 3.5–5.0)
Alkaline Phosphatase: 91 U/L (ref 38–126)
Anion gap: 13 (ref 5–15)
BUN: 22 mg/dL — ABNORMAL HIGH (ref 6–20)
CO2: 24 mmol/L (ref 22–32)
Calcium: 9.9 mg/dL (ref 8.9–10.3)
Chloride: 103 mmol/L (ref 101–111)
Creatinine, Ser: 1.17 mg/dL — ABNORMAL HIGH (ref 0.44–1.00)
GFR calc Af Amer: 48 mL/min — ABNORMAL LOW (ref >60)
GFR calc non Af Amer: 42 mL/min — ABNORMAL LOW (ref >60)
Glucose, Bld: 135 mg/dL — ABNORMAL HIGH (ref 65–99)
Potassium: 4.1 mmol/L (ref 3.5–5.1)
Sodium: 140 mmol/L (ref 135–145)
Total Bilirubin: 0.6 mg/dL (ref 0.3–1.2)
Total Protein: 6.9 g/dL (ref 6.5–8.1)

## 2015-06-17 LAB — CBC
HCT: 35.2 % — ABNORMAL LOW (ref 36.0–46.0)
Hemoglobin: 11.8 g/dL — ABNORMAL LOW (ref 12.0–15.0)
MCH: 28.2 pg (ref 26.0–34.0)
MCHC: 33.5 g/dL (ref 30.0–36.0)
MCV: 84 fL (ref 78.0–100.0)
Platelets: 200 10*3/uL (ref 150–400)
RBC: 4.19 MIL/uL (ref 3.87–5.11)
RDW: 12.5 % (ref 11.5–15.5)
WBC: 6.1 10*3/uL (ref 4.0–10.5)

## 2015-06-17 LAB — I-STAT TROPONIN, ED: Troponin i, poc: 0 ng/mL (ref 0.00–0.08)

## 2015-06-17 LAB — DIFFERENTIAL
Basophils Absolute: 0 10*3/uL (ref 0.0–0.1)
Basophils Relative: 0 %
Eosinophils Absolute: 0.2 10*3/uL (ref 0.0–0.7)
Eosinophils Relative: 3 %
Lymphocytes Relative: 34 %
Lymphs Abs: 2.1 10*3/uL (ref 0.7–4.0)
Monocytes Absolute: 0.5 10*3/uL (ref 0.1–1.0)
Monocytes Relative: 9 %
Neutro Abs: 3.3 10*3/uL (ref 1.7–7.7)
Neutrophils Relative %: 54 %

## 2015-06-17 LAB — PROTIME-INR
INR: 1.1 (ref 0.00–1.49)
Prothrombin Time: 14.4 seconds (ref 11.6–15.2)

## 2015-06-17 LAB — ETHANOL: Alcohol, Ethyl (B): <5 mg/dL (ref <5)

## 2015-06-17 LAB — APTT: aPTT: 28 seconds (ref 24–37)

## 2015-06-17 MED ORDER — ADULT MULTIVITAMIN W/MINERALS CH
1.0000 | ORAL_TABLET | Freq: Every day | ORAL | Status: DC
  Administered 2015-06-18 – 2015-06-19 (×2): 1 via ORAL
  Filled 2015-06-17 (×2): qty 1

## 2015-06-17 MED ORDER — FUROSEMIDE 40 MG PO TABS
40.0000 mg | ORAL_TABLET | Freq: Two times a day (BID) | ORAL | Status: DC
  Administered 2015-06-18 – 2015-06-19 (×3): 40 mg via ORAL
  Filled 2015-06-17 (×3): qty 1

## 2015-06-17 MED ORDER — ENOXAPARIN SODIUM 40 MG/0.4ML ~~LOC~~ SOLN
40.0000 mg | SUBCUTANEOUS | Status: DC
  Administered 2015-06-17 – 2015-06-18 (×2): 40 mg via SUBCUTANEOUS
  Filled 2015-06-17 (×2): qty 0.4

## 2015-06-17 MED ORDER — POTASSIUM CHLORIDE CRYS ER 20 MEQ PO TBCR
20.0000 meq | EXTENDED_RELEASE_TABLET | Freq: Two times a day (BID) | ORAL | Status: DC
  Administered 2015-06-17 – 2015-06-19 (×4): 20 meq via ORAL
  Filled 2015-06-17 (×4): qty 1

## 2015-06-17 MED ORDER — FERROUS SULFATE 325 (65 FE) MG PO TABS
325.0000 mg | ORAL_TABLET | Freq: Three times a day (TID) | ORAL | Status: DC
  Administered 2015-06-18 – 2015-06-19 (×4): 325 mg via ORAL
  Filled 2015-06-17 (×4): qty 1

## 2015-06-17 MED ORDER — PANTOPRAZOLE SODIUM 40 MG PO TBEC
40.0000 mg | DELAYED_RELEASE_TABLET | Freq: Every day | ORAL | Status: DC
  Administered 2015-06-18 – 2015-06-19 (×2): 40 mg via ORAL
  Filled 2015-06-17 (×2): qty 1

## 2015-06-17 MED ORDER — GEMFIBROZIL 600 MG PO TABS
600.0000 mg | ORAL_TABLET | Freq: Two times a day (BID) | ORAL | Status: DC
Start: 2015-06-18 — End: 2015-06-19
  Administered 2015-06-18 – 2015-06-19 (×3): 600 mg via ORAL
  Filled 2015-06-17 (×5): qty 1

## 2015-06-17 MED ORDER — ASPIRIN 325 MG PO TABS
325.0000 mg | ORAL_TABLET | Freq: Every day | ORAL | Status: DC
  Administered 2015-06-17 – 2015-06-18 (×2): 325 mg via ORAL
  Filled 2015-06-17 (×2): qty 1

## 2015-06-17 MED ORDER — METOPROLOL TARTRATE 25 MG PO TABS
25.0000 mg | ORAL_TABLET | Freq: Two times a day (BID) | ORAL | Status: DC
  Administered 2015-06-17 – 2015-06-19 (×4): 25 mg via ORAL
  Filled 2015-06-17 (×4): qty 1

## 2015-06-17 MED ORDER — LORAZEPAM 0.5 MG PO TABS
0.5000 mg | ORAL_TABLET | Freq: Once | ORAL | Status: DC | PRN
  Filled 2015-06-17: qty 1

## 2015-06-17 MED ORDER — ASPIRIN EC 81 MG PO TBEC
81.0000 mg | DELAYED_RELEASE_TABLET | Freq: Every day | ORAL | Status: DC
Start: 2015-06-17 — End: 2015-06-17

## 2015-06-17 MED ORDER — LOSARTAN POTASSIUM 50 MG PO TABS
100.0000 mg | ORAL_TABLET | Freq: Every day | ORAL | Status: DC
  Administered 2015-06-18 – 2015-06-19 (×2): 100 mg via ORAL
  Filled 2015-06-17 (×2): qty 2

## 2015-06-17 MED ORDER — ASPIRIN 300 MG RE SUPP: 300.0000 mg | Freq: Every day | RECTAL | Status: DC

## 2015-06-17 MED ORDER — STROKE: EARLY STAGES OF RECOVERY BOOK
Freq: Once | Status: AC
  Administered 2015-06-18: 20:00:00
  Filled 2015-06-17 (×2): qty 1

## 2015-06-17 MED ORDER — INSULIN GLARGINE 100 UNIT/ML ~~LOC~~ SOLN
50.0000 [IU] | Freq: Every day | SUBCUTANEOUS | Status: DC
  Administered 2015-06-18 – 2015-06-19 (×2): 50 [IU] via SUBCUTANEOUS
  Filled 2015-06-17 (×2): qty 0.5

## 2015-06-17 MED ORDER — VITAMIN B-6 100 MG PO TABS
100.0000 mg | ORAL_TABLET | Freq: Every day | ORAL | Status: DC
  Administered 2015-06-18 – 2015-06-19 (×2): 100 mg via ORAL
  Filled 2015-06-17 (×2): qty 1

## 2015-06-17 MED ORDER — OMEGA-3-ACID ETHYL ESTERS 1 G PO CAPS
2.0000 | ORAL_CAPSULE | Freq: Every day | ORAL | Status: DC
  Administered 2015-06-18 – 2015-06-19 (×2): 2 g via ORAL
  Filled 2015-06-17 (×2): qty 2

## 2015-06-17 MED ORDER — INSULIN ASPART 100 UNIT/ML ~~LOC~~ SOLN: 0.0000 [IU] | Freq: Three times a day (TID) | SUBCUTANEOUS | Status: DC

## 2015-06-17 NOTE — ED Notes (Signed)
Called MRI to ask if they wanted to get pt in scan before going upstairs, stated to take her up and they would get her later

## 2015-06-17 NOTE — ED Notes (Signed)
Dr. Yao at bedside. 

## 2015-06-17 NOTE — ED Provider Notes (Signed)
CSN: [id number]     Arrival date & time 06/17/15  1734 History   First MD Initiated Contact with Patient 06/17/15 1735     Chief Complaint  Patient presents with  . Aphasia     (Consider location/radiation/quality/duration/timing/severity/associated sxs/prior Treatment) The history is provided by the patient.  KATRYN PLUMMER is a 81 y.o. female hx of DM, HTN, reflux here presenting with a aphasia. Patient states that she was on the phone with a friend around 3:15pm. Around 3:30 PM, patient noticed that she had trouble getting words out. When she was able to speak, her friend noticed that she has some slurred speech. Denies any weakness. No history of strokes in the past. Came in EMS and NIH was 0 per EMS.     Past Medical History  Diagnosis Date  . Diabetes mellitus   . Hypertension   . Dyslipidemia   . Varicose veins   . Migraines   . GERD (gastroesophageal reflux disease)   . RSD (reflex sympathetic dystrophy) 2007    R wrist/hand following fx  . Anemia   . Osteoporosis    Past Surgical History  Procedure Laterality Date  . Umbilical hernia repair    . Breast cyst excision    . Tubal ligation    . Cholecystectomy  1989  . Appendectomy  1966  . Abdominal hysterectomy  1988  . Several benign cyst removed      last 1 in 1972  . Cataract extraction  10/2009   Family History  Problem Relation Age of Onset  . Stroke Mother 73  . Hypertension Mother   . Clotting disorder Father   . Heart attack Father   . Arrhythmia Sister   . Stroke Brother   . Diabetes Neg Hx   . Thyroid disease Neg Hx    Social History  Substance Use Topics  . Smoking status: Never Smoker   . Smokeless tobacco: Never Used  . Alcohol Use: No   OB History    No data available     Review of Systems  Neurological: Positive for speech difficulty.  All other systems reviewed and are negative.     Allergies  Lipitor; Niacin and related; Statins; and Macrobid  Home Medications   Prior  to Admission medications   Medication Sig Start Date End Date Taking? Authorizing Provider  aspirin EC 81 MG tablet Take 81 mg by mouth daily.    Historical Provider, MD  B-D ULTRAFINE III SHORT PEN 31G X 8 MM MISC USE AS DIRECTED 01/06/15   [name], MD  Blood Glucose Monitoring Suppl (ONE TOUCH ULTRA SYSTEM KIT) W/DEVICE KIT 1 kit by Does not apply route once. Tests Blood sugar before meals and at bedtime 07/10/14   [name], MD  Calcium Carbonate-Vitamin D (CALTRATE 600+D PO) Take 1 tablet by mouth 2 (two) times daily.    Historical Provider, MD  ciprofloxacin (CIPRO) 500 MG tablet Take 1 tablet (500 mg total) by mouth 2 (two) times daily. 05/05/15   Orlena Sheldon, PA-C  ferrous sulfate 325 (65 FE) MG EC tablet Take 1 tablet (325 mg total) by mouth 3 (three) times daily with meals. 06/21/14   [name], MD  fish oil-omega-3 fatty acids 1000 MG capsule Take 2 g by mouth 2 (two) times daily.      Historical Provider, MD  furosemide (LASIX) 40 MG tablet TAKE 1 TABLET BY MOUTH EVERY DAY. 06/12/15   [name], MD  gemfibrozil (LOPID) 600 MG tablet TAKE 1 TABLET BY MOUTH TWICE DAILY BEFORE A MEAL 01/13/15   [name], MD  glucose blood test strip 1 each by Other route 4 (four) times daily -  before meals and at bedtime. Use as instructed 07/10/14   [name], MD  insulin aspart (NOVOLOG FLEXPEN) 100 UNIT/ML FlexPen Inject 5 units at breakfast  and 7 units at lunch and supper 02/19/15   Elayne Snare, MD  INSULIN SYRINGE 1CC/29G 29G X 1/2" 1 ML MISC  09/18/14   Historical Provider, MD  INSULIN SYRINGE 1CC/29G 29G X 1/2" 1 ML MISC USE AS DIRECTED. 12/05/14   Elayne Snare, MD  LANTUS 100 UNIT/ML injection INJECT 64 UNITS UNDER THE SKIN EVERY DAY. Patient taking differently: INJECT 50 UNITS UNDER THE SKIN EVERY DAY. 01/14/15   [name], MD  losartan (COZAAR) 100 MG tablet TAKE 1 TABLET BY MOUTH EVERY DAY 01/13/15   [name], MD  metoprolol (LOPRESSOR) 50 MG tablet  TAKE 1 TABLET BY MOUTH TWICE DAILY Patient taking differently: TAKE 1/2 TABLET  BY MOUTH TWICE DAILY 10/02/14   [name], MD  Multiple Vitamin (MULTIVITAMIN) tablet Take 1 tablet by mouth daily.      Historical Provider, MD  omega-3 acid ethyl esters (LOVAZA) 1 G capsule  01/14/14   Historical Provider, MD  omega-3 acid ethyl esters (LOVAZA) 1 G capsule TAKE 2 CAPSULES BY MOUTH DAILY 12/09/14   [name], MD  omeprazole (PRILOSEC) 20 MG capsule TAKE 1 CAPSULE BY MOUTH EVERY DAY 10/17/14   [name], MD  potassium chloride SA (K-DUR,KLOR-CON) 20 MEQ tablet TAKE 1 TABLET BY MOUTH TWICE DAILY 12/05/14   Alycia Rossetti, MD  Pyridoxine HCl (VITAMIN B-6) 100 MG tablet Take 100 mg by mouth daily.      Historical Provider, MD   BP 168/71 mmHg  Pulse 68  Temp(Src) 98.4 F (36.9 C) (Oral)  Resp 20  SpO2 97% Physical Exam  Constitutional: She is oriented to person, place, and time.  Well appearing   HENT:  Head: Normocephalic.  Mouth/Throat: Oropharynx is clear and moist.  Eyes: Conjunctivae are normal. Pupils are equal, round, and reactive to light.  Neck: Normal range of motion. Neck supple.  Cardiovascular: Normal rate, regular rhythm and normal heart sounds.   Pulmonary/Chest: Effort normal and breath sounds normal. No respiratory distress. She has no wheezes. She has no rales.  Abdominal: Soft. Bowel sounds are normal. She exhibits no distension. There is no tenderness. There is no rebound.  Musculoskeletal: Normal range of motion.  Neurological: She is alert and oriented to person, place, and time. No cranial nerve deficit. Coordination normal.  Nl speech, nl strength throughout. CN 2-12 intact. No obvious facial droop   Skin: Skin is warm and dry.  Psychiatric: She has a normal mood and affect. Her behavior is normal. Judgment and thought content normal.  Nursing note and vitals reviewed.   ED Course  Procedures (including critical care time) Labs Review Labs Reviewed   CBC - Abnormal; Notable for the following:    Hemoglobin 11.8 (*)    HCT 35.2 (*)    All other components within normal limits  COMPREHENSIVE METABOLIC PANEL - Abnormal; Notable for the following:    Glucose, Bld 135 (*)    BUN 22 (*)    Creatinine, Ser 1.17 (*)    GFR calc non Af Amer 42 (*)    GFR calc Af Amer 48 (*)  All other components within normal limits  I-STAT CHEM 8, ED - Abnormal; Notable for the following:    BUN 24 (*)    Creatinine, Ser 1.10 (*)    Glucose, Bld 138 (*)    Hemoglobin 11.9 (*)    HCT 35.0 (*)    All other components within normal limits  PROTIME-INR  APTT  DIFFERENTIAL  ETHANOL  URINE RAPID DRUG SCREEN, HOSP PERFORMED  URINALYSIS, ROUTINE W REFLEX MICROSCOPIC (NOT AT [facility])  I-STAT TROPOININ, ED    Imaging Review Ct Head Wo Contrast  06/17/2015  CLINICAL DATA:  Occipital headache at night 2 months. Intermittent slurred speech today. EXAM: CT HEAD WITHOUT CONTRAST TECHNIQUE: Contiguous axial images were obtained from the base of the skull through the vertex without intravenous contrast. COMPARISON:  05/23/2012 FINDINGS: Ventricles, cisterns and other CSF spaces are within normal. There is mild age related atrophy. There is moderate chronic ischemic microvascular disease. Possible old lacune infarct versus perivascular space just inferior to the right lentiform nucleus. No evidence of focal mass, mass effect, shift of midline structures or acute hemorrhage. No evidence of acute infarction. Remaining bones and soft tissues are within normal. IMPRESSION: No acute intracranial findings. Chronic ischemic microvascular disease in minimal age related atrophic change. Electronically Signed   By: Marin Olp M.D.   On: 06/17/2015 19:05   I have personally reviewed and evaluated these images and lab results as part of my medical decision-making.   EKG Interpretation   Date/Time:  Tuesday June 17 2015 17:40:20 EST Ventricular Rate:  65 PR Interval:   209 QRS Duration: 82 QT Interval:  406 QTC Calculation: 422 R Axis:   51 Text Interpretation:  Sinus rhythm Low voltage, precordial leads Abnormal  R-wave progression, early transition No significant change since last  tracing Confirmed by Sloane Junkin  MD, Kc Sedlak ([id number]) on 06/17/2015 5:44:28 PM      MDM   Final diagnoses:  None   MINAAL STRUCKMAN is a 81 y.o. female here with TIA. NIH 0 on arrival. Given age and risk factors, will likely need inpatient admission for TIA workup. Consulted Dr. Silverio Decamp, who doesn't recommend TPA. Will get CT head, labs. Will likely admit.   7:20 PM CT head unremarkable. Labs at baseline. Will admit for TIA workup    Wandra Arthurs, MD 06/17/15 [phone]

## 2015-06-17 NOTE — Consult Note (Signed)
NEURO HOSPITALIST CONSULT NOTE   Requesting physician:Dr. Darl Householder  Reason for Consult: Evaluate for TIA, speech problem  HPI:                                                                                                                                           Rebecca Perkins is an 82 y.o. female with a 15 min episode of unable to speak the correct words out, winessed by her family. No other focal neuro symptoms at that time. Sx resolved in 10-15 min.  No residual deficits now.   Past Medical History  Diagnosis Date  . Diabetes mellitus   . Hypertension   . Dyslipidemia   . Varicose veins   . Migraines   . GERD (gastroesophageal reflux disease)   . RSD (reflex sympathetic dystrophy) 2007    R wrist/hand following fx  . Anemia   . Osteoporosis     Past Surgical History  Procedure Laterality Date  . Umbilical hernia repair    . Breast cyst excision    . Tubal ligation    . Cholecystectomy  1989  . Appendectomy  1966  . Abdominal hysterectomy  1988  . Several benign cyst removed      last 1 in 1972  . Cataract extraction  10/2009    Family History  Problem Relation Age of Onset  . Stroke Mother 52  . Hypertension Mother   . Clotting disorder Father   . Heart attack Father   . Arrhythmia Sister   . Stroke Brother   . Diabetes Neg Hx   . Thyroid disease Neg Hx     Family History: of HTN, CAD.   Social History:  reports that she has never smoked. She has never used smokeless tobacco. She reports that she does not drink alcohol or use illicit drugs.  Allergies  Allergen Reactions  . Lipitor [Atorvastatin Calcium] Diarrhea  . Niacin And Related Hives  . Statins Diarrhea  . Macrobid [Nitrofurantoin Macrocrystal] Other (See Comments)    unknown    MEDICATIONS:                                                                                                                     I have reviewed the patient's current medications.  No current  facility-administered medications for this encounter.   Current Outpatient Prescriptions  Medication Sig Dispense Refill  . aspirin EC 81 MG tablet Take 81 mg by mouth daily.    . Calcium Carbonate-Vitamin D (CALTRATE 600+D PO) Take 1 tablet by mouth 2 (two) times daily.    . ferrous sulfate 325 (65 FE) MG EC tablet Take 1 tablet (325 mg total) by mouth 3 (three) times daily with meals. 90 tablet 11  . furosemide (LASIX) 40 MG tablet Take 40 mg by mouth 2 (two) times daily.    Marland Kitchen gemfibrozil (LOPID) 600 MG tablet TAKE 1 TABLET BY MOUTH TWICE DAILY BEFORE A MEAL 60 tablet 5  . LANTUS 100 UNIT/ML injection INJECT 64 UNITS UNDER THE SKIN EVERY DAY. (Patient taking differently: INJECT 50 UNITS UNDER THE SKIN EVERY DAY.) 20 mL 3  . losartan (COZAAR) 100 MG tablet TAKE 1 TABLET BY MOUTH EVERY DAY 30 tablet 5  . metoprolol (LOPRESSOR) 50 MG tablet TAKE 1 TABLET BY MOUTH TWICE DAILY (Patient taking differently: TAKE 1/2 TABLET  BY MOUTH TWICE DAILY) 180 tablet 0  . Multiple Vitamin (MULTIVITAMIN) tablet Take 1 tablet by mouth daily.      Marland Kitchen omega-3 acid ethyl esters (LOVAZA) 1 G capsule TAKE 2 CAPSULES BY MOUTH DAILY 60 capsule 3  . omeprazole (PRILOSEC) 20 MG capsule TAKE 1 CAPSULE BY MOUTH EVERY DAY 30 capsule 11  . potassium chloride SA (K-DUR,KLOR-CON) 20 MEQ tablet TAKE 1 TABLET BY MOUTH TWICE DAILY 180 tablet 0  . Pyridoxine HCl (VITAMIN B-6) 100 MG tablet Take 100 mg by mouth daily.      . B-D ULTRAFINE III SHORT PEN 31G X 8 MM MISC USE AS DIRECTED 100 each 5  . Blood Glucose Monitoring Suppl (ONE TOUCH ULTRA SYSTEM KIT) W/DEVICE KIT 1 kit by Does not apply route once. Tests Blood sugar before meals and at bedtime 1 each 0  . ciprofloxacin (CIPRO) 500 MG tablet Take 1 tablet (500 mg total) by mouth 2 (two) times daily. (Patient not taking: Reported on 06/17/2015) 14 tablet 0  . furosemide (LASIX) 40 MG tablet TAKE 1 TABLET BY MOUTH EVERY DAY. (Patient not taking: Reported on 06/17/2015) 30 tablet 11   . glucose blood test strip 1 each by Other route 4 (four) times daily -  before meals and at bedtime. Use as instructed 150 each 12  . insulin aspart (NOVOLOG FLEXPEN) 100 UNIT/ML FlexPen Inject 5 units at breakfast  and 7 units at lunch and supper 15 mL 3  . INSULIN SYRINGE 1CC/29G 29G X 1/2" 1 ML MISC   6  . INSULIN SYRINGE 1CC/29G 29G X 1/2" 1 ML MISC USE AS DIRECTED. 100 each 5  . omega-3 acid ethyl esters (LOVAZA) 1 G capsule         ROS:  History obtained from the patient  General ROS: negative for - chills, fatigue, fever, night sweats, weight gain or weight loss Psychological ROS: negative for - behavioral disorder, hallucinations, memory difficulties, mood swings or suicidal ideation Ophthalmic ROS: negative for - blurry vision, double vision, eye pain or loss of vision ENT ROS: negative for - epistaxis, nasal discharge, oral lesions, sore throat, tinnitus or vertigo Allergy and Immunology ROS: negative for - hives or itchy/watery eyes Hematological and Lymphatic ROS: negative for - bleeding problems, bruising or swollen lymph nodes Endocrine ROS: negative for - galactorrhea, hair pattern changes, polydipsia/polyuria or temperature intolerance Respiratory ROS: negative for - cough, hemoptysis, shortness of breath or wheezing Cardiovascular ROS: negative for - chest pain, dyspnea on exertion, edema or irregular heartbeat Gastrointestinal ROS: negative for - abdominal pain, diarrhea, hematemesis, nausea/vomiting or stool incontinence Genito-Urinary ROS: negative for - dysuria, hematuria, incontinence or urinary frequency/urgency Musculoskeletal ROS: negative for - joint swelling or muscular weakness Neurological ROS: as noted in HPI Dermatological ROS: negative for rash and skin lesion changes   Blood pressure 155/62, pulse 65, temperature 98.3 F  (36.8 C), temperature source Oral, resp. rate 21, SpO2 98 %.   Neurologic Examination:                                                                                                      HEENT-  Normocephalic, no lesions, without obvious abnormality.  Normal external eye and conjunctiva.   Normal  external ears. Normal external nose, mucus membranes and septum.    Neurological Examination Mental Status: Alert, oriented, thought content appropriate.  Speech fluent without evidence of aphasia.  Able to follow 3 step commands without difficulty. Cranial Nerves: II:   Visual fields grossly normal, pupils equal, round, reactive to light and accommodation III,IV, VI: ptosis not present, extra-ocular motions intact bilaterally V,VII: smile symmetric, facial light touch sensation normal bilaterally VIII: hearing normal bilaterally IX,X: uvula rises symmetrically XI: bilateral shoulder shrug XII: midline tongue extension Motor: Right : Upper extremity   5/5    Left:     Upper extremity   5/5  Lower extremity   5/5     Lower extremity   5/5 Tone and bulk:normal tone throughout; no atrophy noted Sensory: Pinprick and light touch intact throughout, bilaterally Deep Tendon Reflexes: 2+ and symmetric throughout Plantars: Right: downgoing   Left: downgoing Cerebellar: normal finger-to-nose, normal rapid alternating movements and normal heel-to-shin test Gait: wide based gait, 4 step retropulsion. No limb or truncal rigidity noted.     Lab Results: Basic Metabolic Panel:  Recent Labs Lab 06/17/15 1810 06/17/15 1834  NA 140 141  K 4.1 4.1  CL 103 106  CO2 24  --   GLUCOSE 135* 138*  BUN 22* 24*  CREATININE 1.17* 1.10*  CALCIUM 9.9  --     Liver Function Tests:  Recent Labs Lab 06/17/15 1810  AST 25  ALT 18  ALKPHOS 91  BILITOT 0.6  PROT 6.9  ALBUMIN 3.7   No results for input(s): LIPASE, AMYLASE in the last 168 hours. No results  for input(s): AMMONIA in the last 168  hours.  CBC:  Recent Labs Lab 06/17/15 1810 06/17/15 1834  WBC 6.1  --   NEUTROABS 3.3  --   HGB 11.8* 11.9*  HCT 35.2* 35.0*  MCV 84.0  --   PLT 200  --     Cardiac Enzymes: No results for input(s): CKTOTAL, CKMB, CKMBINDEX, TROPONINI in the last 168 hours.  Lipid Panel: No results for input(s): CHOL, TRIG, HDL, CHOLHDL, VLDL, LDLCALC in the last 168 hours.  CBG: No results for input(s): GLUCAP in the last 168 hours.  Microbiology: Results for orders placed or performed in visit on 05/05/15  Urine culture     Status: None   Collection Time: 05/05/15 11:21 AM  Result Value Ref Range Status   Culture KLEBSIELLA PNEUMONIAE  Final   Colony Count >=100,000 COLONIES/ML  Final   Organism ID, Bacteria KLEBSIELLA PNEUMONIAE  Final      Susceptibility   Klebsiella pneumoniae -  (no method available)    AMPICILLIN  Resistant     AMOX/CLAVULANIC 16 Intermediate     AMPICILLIN/SULBACTAM 8 Sensitive     PIP/TAZO <=4 Sensitive     IMIPENEM 0.5 Sensitive     CEFAZOLIN <=4 Not Reportable     CEFTRIAXONE <=1 Sensitive     CEFTAZIDIME <=1 Sensitive     CEFEPIME <=1 Sensitive     GENTAMICIN <=1 Sensitive     TOBRAMYCIN <=1 Sensitive     CIPROFLOXACIN <=0.25 Sensitive     LEVOFLOXACIN <=0.12 Sensitive     NITROFURANTOIN 32 Sensitive     TRIMETH/SULFA* <=20 Sensitive      * NR=NOT REPORTABLE,SEE COMMENTORAL therapy:A cefazolin MIC of <32 predicts susceptibility to the oral agents cefaclor,cefdinir,cefpodoxime,cefprozil,cefuroxime,cephalexin,and loracarbef when used for therapy of uncomplicated UTIs due to E.coli,K.pneumomiae,and P.mirabilis. PARENTERAL therapy: A cefazolinMIC of >8 indicates resistance to parenteralcefazolin. An alternate test method must beperformed to confirm susceptibility to parenteralcefazolin.    Coagulation Studies:  Recent Labs  06/17/15 1810  LABPROT 14.4  INR 1.10    Imaging: Ct Head Wo Contrast  06/17/2015  CLINICAL DATA:  Occipital headache at  night 2 months. Intermittent slurred speech today. EXAM: CT HEAD WITHOUT CONTRAST TECHNIQUE: Contiguous axial images were obtained from the base of the skull through the vertex without intravenous contrast. COMPARISON:  05/23/2012 FINDINGS: Ventricles, cisterns and other CSF spaces are within normal. There is mild age related atrophy. There is moderate chronic ischemic microvascular disease. Possible old lacune infarct versus perivascular space just inferior to the right lentiform nucleus. No evidence of focal mass, mass effect, shift of midline structures or acute hemorrhage. No evidence of acute infarction. Remaining bones and soft tissues are within normal. IMPRESSION: No acute intracranial findings. Chronic ischemic microvascular disease in minimal age related atrophic change. Electronically Signed   By: Marin Olp M.D.   On: 06/17/2015 19:05      Assessment/Plan:  81 yr old pt with 10-15 min speech problems, could be a TIA given her vascular risk factors. Now sx resolved, non focal neuro exam. CT brain NAICP.  Will be admitted for overnight observation and TIA w/u.  MRI brain, TTE, cardiac tele.  PT for gait training, baseline gait instability, no acute change.

## 2015-06-17 NOTE — ED Notes (Signed)
Pt from home via [facility] with c/o intermittent aphasia starting today around 1530.  Pt reports she was aware of what she was trying to say but unable to get the correct words out.  Pt negative on the stroke scale per EMS, no aphasia or other complaints with EMS or on arrival.  Pt in NAD, A&O.

## 2015-06-17 NOTE — H&P (Signed)
[facility] History and Physical  Rebecca Perkins EVO:[id number] DOB: November 02, 1928 DOA: 06/17/2015  Referring physician: EDP PCP: Odette Fraction, MD   Chief Complaint: Aphasia   HPI: Rebecca Perkins is a 81 y.o. female with h/o DM2 on insulin, HTN, HLD.  Patient presents to ED after an episode of difficulty getting the correct words out.  Symptoms lasted 45 mins per family before resolution per family.  NIH is 0 on presentation to the ED (symptoms resolved by time she got to ED) so no TPA given.  Review of Systems: Systems reviewed.  As above, otherwise negative  Past Medical History  Diagnosis Date  . Diabetes mellitus   . Hypertension   . Dyslipidemia   . Varicose veins   . Migraines   . GERD (gastroesophageal reflux disease)   . RSD (reflex sympathetic dystrophy) 2007    R wrist/hand following fx  . Anemia   . Osteoporosis    Past Surgical History  Procedure Laterality Date  . Umbilical hernia repair    . Breast cyst excision    . Tubal ligation    . Cholecystectomy  1989  . Appendectomy  1966  . Abdominal hysterectomy  1988  . Several benign cyst removed      last 1 in 1972  . Cataract extraction  10/2009   Social History:  reports that she has never smoked. She has never used smokeless tobacco. She reports that she does not drink alcohol or use illicit drugs.  Allergies  Allergen Reactions  . Lipitor [Atorvastatin Calcium] Diarrhea  . Niacin And Related Hives  . Statins Diarrhea  . Macrobid [Nitrofurantoin Macrocrystal] Other (See Comments)    unknown    Family History  Problem Relation Age of Onset  . Stroke Mother 64  . Hypertension Mother   . Clotting disorder Father   . Heart attack Father   . Arrhythmia Sister   . Stroke Brother   . Diabetes Neg Hx   . Thyroid disease Neg Hx      Prior to Admission medications   Medication Sig Start Date End Date Taking? Authorizing Provider  aspirin EC 81 MG tablet Take 81 mg by mouth daily.   Yes  Historical Provider, MD  Calcium Carbonate-Vitamin D (CALTRATE 600+D PO) Take 1 tablet by mouth 2 (two) times daily.   Yes Historical Provider, MD  ferrous sulfate 325 (65 FE) MG EC tablet Take 1 tablet (325 mg total) by mouth 3 (three) times daily with meals. 06/21/14  Yes [name], MD  furosemide (LASIX) 40 MG tablet Take 40 mg by mouth 2 (two) times daily.   Yes Historical Provider, MD  gemfibrozil (LOPID) 600 MG tablet TAKE 1 TABLET BY MOUTH TWICE DAILY BEFORE A MEAL 01/13/15  Yes [name], MD  LANTUS 100 UNIT/ML injection INJECT 34 UNITS UNDER THE SKIN EVERY DAY. Patient taking differently: INJECT 50 UNITS UNDER THE SKIN EVERY DAY. 01/14/15  Yes [name], MD  losartan (COZAAR) 100 MG tablet TAKE 1 TABLET BY MOUTH EVERY DAY 01/13/15  Yes [name], MD  metoprolol (LOPRESSOR) 50 MG tablet TAKE 1 TABLET BY MOUTH TWICE DAILY Patient taking differently: TAKE 1/2 TABLET  BY MOUTH TWICE DAILY 10/02/14  Yes [name], MD  Multiple Vitamin (MULTIVITAMIN) tablet Take 1 tablet by mouth daily.     Yes Historical Provider, MD  omega-3 acid ethyl esters (LOVAZA) 1 G capsule TAKE 2 CAPSULES BY MOUTH DAILY 12/09/14  Yes Cletus Gash T  Pickard, MD  omeprazole (PRILOSEC) 20 MG capsule TAKE 1 CAPSULE BY MOUTH EVERY DAY 10/17/14  Yes [name], MD  potassium chloride SA (K-DUR,KLOR-CON) 20 MEQ tablet TAKE 1 TABLET BY MOUTH TWICE DAILY 12/05/14  Yes Alycia Rossetti, MD  Pyridoxine HCl (VITAMIN B-6) 100 MG tablet Take 100 mg by mouth daily.     Yes Historical Provider, MD  B-D ULTRAFINE III SHORT PEN 31G X 8 MM MISC USE AS DIRECTED 01/06/15   [name], MD  Blood Glucose Monitoring Suppl (ONE TOUCH ULTRA SYSTEM KIT) W/DEVICE KIT 1 kit by Does not apply route once. Tests Blood sugar before meals and at bedtime 07/10/14   [name], MD  glucose blood test strip 1 each by Other route 4 (four) times daily -  before meals and at bedtime. Use as instructed 07/10/14   [name], MD  insulin aspart (NOVOLOG FLEXPEN) 100 UNIT/ML FlexPen Inject 5 units at breakfast  and 7 units at lunch and supper 02/19/15   Elayne Snare, MD  INSULIN SYRINGE 1CC/29G 29G X 1/2" 1 ML MISC  09/18/14   Historical Provider, MD  INSULIN SYRINGE 1CC/29G 29G X 1/2" 1 ML MISC USE AS DIRECTED. 12/05/14   Elayne Snare, MD   Physical Exam: Filed Vitals:   06/17/15 1925  BP:   Pulse:   Temp: 98.3 F (36.8 C)  Resp:     BP 155/62 mmHg  Pulse 65  Temp(Src) 98.3 F (36.8 C) (Oral)  Resp 21  SpO2 98%  General Appearance:    Alert, oriented, no distress, appears stated age  Head:    Normocephalic, atraumatic  Eyes:    PERRL, EOMI, sclera non-icteric        Nose:   Nares without drainage or epistaxis. Mucosa, turbinates normal  Throat:   Moist mucous membranes. Oropharynx without erythema or exudate.  Neck:   Supple. No carotid bruits.  No thyromegaly.  No lymphadenopathy.   Back:     No CVA tenderness, no spinal tenderness  Lungs:     Clear to auscultation bilaterally, without wheezes, rhonchi or rales  Chest wall:    No tenderness to palpitation  Heart:    Regular rate and rhythm without murmurs, gallops, rubs  Abdomen:     Soft, non-tender, nondistended, normal bowel sounds, no organomegaly  Genitalia:    deferred  Rectal:    deferred  Extremities:   No clubbing, cyanosis or edema.  Pulses:   2+ and symmetric all extremities  Skin:   Skin color, texture, turgor normal, no rashes or lesions  Lymph nodes:   Cervical, supraclavicular, and axillary nodes normal  Neurologic:   CNII-XII intact. Normal strength, sensation and reflexes      throughout    Labs on Admission:  Basic Metabolic Panel:  Recent Labs Lab 06/17/15 1810 06/17/15 1834  NA 140 141  K 4.1 4.1  CL 103 106  CO2 24  --   GLUCOSE 135* 138*  BUN 22* 24*  CREATININE 1.17* 1.10*  CALCIUM 9.9  --    Liver Function Tests:  Recent Labs Lab 06/17/15 1810  AST 25  ALT 18  ALKPHOS 91  BILITOT 0.6  PROT 6.9   ALBUMIN 3.7   No results for input(s): LIPASE, AMYLASE in the last 168 hours. No results for input(s): AMMONIA in the last 168 hours. CBC:  Recent Labs Lab 06/17/15 1810 06/17/15 1834  WBC 6.1  --   NEUTROABS 3.3  --  HGB 11.8* 11.9*  HCT 35.2* 35.0*  MCV 84.0  --   PLT 200  --    Cardiac Enzymes: No results for input(s): CKTOTAL, CKMB, CKMBINDEX, TROPONINI in the last 168 hours.  BNP (last 3 results) No results for input(s): PROBNP in the last 8760 hours. CBG: No results for input(s): GLUCAP in the last 168 hours.  Radiological Exams on Admission: Ct Head Wo Contrast  06/17/2015  CLINICAL DATA:  Occipital headache at night 2 months. Intermittent slurred speech today. EXAM: CT HEAD WITHOUT CONTRAST TECHNIQUE: Contiguous axial images were obtained from the base of the skull through the vertex without intravenous contrast. COMPARISON:  05/23/2012 FINDINGS: Ventricles, cisterns and other CSF spaces are within normal. There is mild age related atrophy. There is moderate chronic ischemic microvascular disease. Possible old lacune infarct versus perivascular space just inferior to the right lentiform nucleus. No evidence of focal mass, mass effect, shift of midline structures or acute hemorrhage. No evidence of acute infarction. Remaining bones and soft tissues are within normal. IMPRESSION: No acute intracranial findings. Chronic ischemic microvascular disease in minimal age related atrophic change. Electronically Signed   By: Marin Olp M.D.   On: 06/17/2015 19:05    EKG: Independently reviewed.  Assessment/Plan Principal Problem:   TIA (transient ischemic attack) Active Problems:   Hypertension   Dyslipidemia   Diabetes mellitus type II, uncontrolled ([place])   1. TIA - 1. Stroke pathway 2. Risk factor modification 3. ASA 325 daily for now till stroke team decides otherwise 4. See neuro note 2. HTN - continue home meds 3. HLD - continue home meds 4. DM2 - 1. Continue  lantus 2. SSI med dose AC/HS    Code Status: Full  Family Communication: Family at bedside Disposition Plan: Admit to obs   Time spent: 70 min  Deavin Forst M. [facility] Pager [phone]  If 7AM-7PM, please contact the day team taking care of the patient [email or web address] Password [email or web address] 06/17/2015, 7:59 PM

## 2015-06-17 NOTE — ED Notes (Signed)
Attempted report X1

## 2015-06-18 ENCOUNTER — Observation Stay (HOSPITAL_COMMUNITY): Payer: Medicare Other

## 2015-06-18 ENCOUNTER — Observation Stay (HOSPITAL_BASED_OUTPATIENT_CLINIC_OR_DEPARTMENT_OTHER): Payer: Medicare Other

## 2015-06-18 DIAGNOSIS — I10 Essential (primary) hypertension: Secondary | ICD-10-CM

## 2015-06-18 DIAGNOSIS — G459 Transient cerebral ischemic attack, unspecified: Secondary | ICD-10-CM

## 2015-06-18 DIAGNOSIS — E1165 Type 2 diabetes mellitus with hyperglycemia: Secondary | ICD-10-CM | POA: Diagnosis not present

## 2015-06-18 DIAGNOSIS — G451 Carotid artery syndrome (hemispheric): Secondary | ICD-10-CM

## 2015-06-18 DIAGNOSIS — E785 Hyperlipidemia, unspecified: Secondary | ICD-10-CM | POA: Diagnosis not present

## 2015-06-18 DIAGNOSIS — Z794 Long term (current) use of insulin: Secondary | ICD-10-CM

## 2015-06-18 LAB — URINALYSIS, ROUTINE W REFLEX MICROSCOPIC
Bilirubin Urine: NEGATIVE
Glucose, UA: 100 mg/dL — AB
Hgb urine dipstick: NEGATIVE
Ketones, ur: NEGATIVE mg/dL
Nitrite: NEGATIVE
Protein, ur: NEGATIVE mg/dL
Specific Gravity, Urine: 1.014 (ref 1.005–1.030)
pH: 5.5 (ref 5.0–8.0)

## 2015-06-18 LAB — GLUCOSE, CAPILLARY
Glucose-Capillary: 81 mg/dL (ref 65–99)
Glucose-Capillary: 145 mg/dL — ABNORMAL HIGH (ref 65–99)
Glucose-Capillary: 122 mg/dL — ABNORMAL HIGH (ref 65–99)
Glucose-Capillary: 197 mg/dL — ABNORMAL HIGH (ref 65–99)

## 2015-06-18 LAB — LIPID PANEL
Cholesterol: 174 mg/dL (ref 0–200)
HDL: 26 mg/dL — ABNORMAL LOW (ref >40)
LDL Cholesterol: 81 mg/dL (ref 0–99)
Total CHOL/HDL Ratio: 6.7 RATIO
Triglycerides: 337 mg/dL — ABNORMAL HIGH (ref <150)
VLDL: 67 mg/dL — ABNORMAL HIGH (ref 0–40)

## 2015-06-18 LAB — RAPID URINE DRUG SCREEN, HOSP PERFORMED
Amphetamines: NOT DETECTED
Barbiturates: NOT DETECTED
Benzodiazepines: NOT DETECTED
Cocaine: NOT DETECTED
Opiates: NOT DETECTED
Tetrahydrocannabinol: NOT DETECTED

## 2015-06-18 LAB — URINE MICROSCOPIC-ADD ON

## 2015-06-18 MED ORDER — CLOPIDOGREL BISULFATE 75 MG PO TABS
75.0000 mg | ORAL_TABLET | Freq: Every day | ORAL | Status: DC
Start: 2015-06-18 — End: 2015-06-19
  Administered 2015-06-18 – 2015-06-19 (×2): 75 mg via ORAL
  Filled 2015-06-18 (×2): qty 1

## 2015-06-18 NOTE — Progress Notes (Signed)
PROGRESS NOTE  Rebecca Perkins [id number] DOB: Apr 03, 1929 DOA: 06/17/2015 PCP: Odette Fraction, MD  Assessment/Plan: TIA -  MRI negative Echo/carotid FLP- LDL 81-- allergic to statins- needs dietary modifications, HgbA1C ASA 325 daily -- change to plavix vs PARFAIT trial PT- home health  HTN - continue home meds  HLD - continue home meds  DM2 - Continue lantus SSI med dose AC/HS  Code Status: full Family Communication: patient Disposition Plan:    Consultants:  neuro  Procedures:      HPI/Subjective: Doing well today  Objective: Filed Vitals:   06/18/15 1010  BP: 144/49  Pulse: 58  Temp: 98.2 F (36.8 C)  Resp: 15   No intake or output data in the 24 hours ending 06/18/15 1302 Filed Weights   06/17/15 2051  Weight: 70.171 kg (154 lb 11.2 oz)    Exam:   General:  Awake, NAD  Cardiovascular: rrr  Respiratory: clear  Abdomen: +BS, soft  Musculoskeletal: no edema, moves all 4 ext  Data Reviewed: Basic Metabolic Panel:  Recent Labs Lab 06/17/15 1810 06/17/15 1834  NA 140 141  K 4.1 4.1  CL 103 106  CO2 24  --   GLUCOSE 135* 138*  BUN 22* 24*  CREATININE 1.17* 1.10*  CALCIUM 9.9  --    Liver Function Tests:  Recent Labs Lab 06/17/15 1810  AST 25  ALT 18  ALKPHOS 91  BILITOT 0.6  PROT 6.9  ALBUMIN 3.7   No results for input(s): LIPASE, AMYLASE in the last 168 hours. No results for input(s): AMMONIA in the last 168 hours. CBC:  Recent Labs Lab 06/17/15 1810 06/17/15 1834  WBC 6.1  --   NEUTROABS 3.3  --   HGB 11.8* 11.9*  HCT 35.2* 35.0*  MCV 84.0  --   PLT 200  --    Cardiac Enzymes: No results for input(s): CKTOTAL, CKMB, CKMBINDEX, TROPONINI in the last 168 hours. BNP (last 3 results) No results for input(s): BNP in the last 8760 hours.  ProBNP (last 3 results) No results for input(s): PROBNP in the last 8760 hours.  CBG:  Recent Labs Lab 06/17/15 1759 06/17/15 2128 06/18/15 0645  06/18/15 1132  GLUCAP 129* 223* 81 145*    No results found for this or any previous visit (from the past 240 hour(s)).   Studies: Ct Head Wo Contrast  06/17/2015  CLINICAL DATA:  Occipital headache at night 2 months. Intermittent slurred speech today. EXAM: CT HEAD WITHOUT CONTRAST TECHNIQUE: Contiguous axial images were obtained from the base of the skull through the vertex without intravenous contrast. COMPARISON:  05/23/2012 FINDINGS: Ventricles, cisterns and other CSF spaces are within normal. There is mild age related atrophy. There is moderate chronic ischemic microvascular disease. Possible old lacune infarct versus perivascular space just inferior to the right lentiform nucleus. No evidence of focal mass, mass effect, shift of midline structures or acute hemorrhage. No evidence of acute infarction. Remaining bones and soft tissues are within normal. IMPRESSION: No acute intracranial findings. Chronic ischemic microvascular disease in minimal age related atrophic change. Electronically Signed   By: Marin Olp M.D.   On: 06/17/2015 19:05   Mr Brain Wo Contrast  06/17/2015  CLINICAL DATA:  45 minutes episode of aphasia. History of diabetes, hypertension, dyslipidemia, migraines. EXAM: MRI HEAD WITHOUT CONTRAST MRA HEAD WITHOUT CONTRAST TECHNIQUE: Multiplanar, multiecho pulse sequences of the brain and surrounding structures were obtained without intravenous contrast. Angiographic images of the head were obtained using MRA technique without  contrast. COMPARISON:  Chest CT head June 17, 2015 at 1839 hours. FINDINGS: MRI HEAD FINDINGS The ventricles and sulci are normal for patient's age. No abnormal parenchymal signal, mass lesions, mass effect. No reduced diffusion to suggest acute ischemia. Scattered subcentimeter foci of susceptibility artifact though the peripheral brain and LEFT thalamus. Old LEFT basal ganglia and LEFT thalamus lacunar infarct. Patchy to confluent supratentorial and  pontine white matter T2 hyperintensities. No abnormal extra-axial fluid collections. No extra-axial masses though, contrast enhanced sequences would be more sensitive. Normal major intracranial vascular flow voids seen at the skull base. Ocular globes and orbital contents are unremarkable though not tailored for evaluation. No abnormal sellar expansion. Visualized paranasal sinuses and mastoid air cells are well-aerated. Mild nonspecific heterogeneous calvarial bone marrow signal. Craniocervical junction maintained. Patient is edentulous. Small amount of pannus about the odontoid process can be seen with CPPD. MRA HEAD FINDINGS Anterior circulation: Normal flow related enhancement of the included cervical, petrous, cavernous and supra clinoid internal carotid arteries. Patent anterior communicating artery. Normal flow related enhancement of the anterior and middle cerebral arteries, including more distal segments. No large vessel occlusion, high-grade stenosis, abnormal luminal irregularity, aneurysm. Posterior circulation: LEFT vertebral artery is dominant. Basilar artery is patent, with normal flow related enhancement of the main branch vessels. Tiny RIGHT posterior communicating artery is present. Normal flow related enhancement of the posterior cerebral arteries. Mild stenosis LEFT P1-2 junction. No large vessel occlusion, high-grade stenosis, abnormal luminal irregularity, aneurysm. IMPRESSION: MRI HEAD: No acute intracranial process, specifically no acute ischemia. Moderate to severe white matter changes compatible with chronic small vessel ischemic disease. Susceptibility artifact in a pattern most commonly seen with chronic hypertension. Old LEFT basal ganglia and thalamus lacunar infarcts. MRA HEAD: No acute large vessel occlusion or high-grade stenosis. Mild stenosis LEFT LEFT P1-2 junction. Electronically Signed   By: Elon Alas M.D.   On: 06/17/2015 23:56   Mr Jodene Nam Head/brain Wo Cm  06/17/2015   CLINICAL DATA:  45 minutes episode of aphasia. History of diabetes, hypertension, dyslipidemia, migraines. EXAM: MRI HEAD WITHOUT CONTRAST MRA HEAD WITHOUT CONTRAST TECHNIQUE: Multiplanar, multiecho pulse sequences of the brain and surrounding structures were obtained without intravenous contrast. Angiographic images of the head were obtained using MRA technique without contrast. COMPARISON:  Chest CT head June 17, 2015 at 1839 hours. FINDINGS: MRI HEAD FINDINGS The ventricles and sulci are normal for patient's age. No abnormal parenchymal signal, mass lesions, mass effect. No reduced diffusion to suggest acute ischemia. Scattered subcentimeter foci of susceptibility artifact though the peripheral brain and LEFT thalamus. Old LEFT basal ganglia and LEFT thalamus lacunar infarct. Patchy to confluent supratentorial and pontine white matter T2 hyperintensities. No abnormal extra-axial fluid collections. No extra-axial masses though, contrast enhanced sequences would be more sensitive. Normal major intracranial vascular flow voids seen at the skull base. Ocular globes and orbital contents are unremarkable though not tailored for evaluation. No abnormal sellar expansion. Visualized paranasal sinuses and mastoid air cells are well-aerated. Mild nonspecific heterogeneous calvarial bone marrow signal. Craniocervical junction maintained. Patient is edentulous. Small amount of pannus about the odontoid process can be seen with CPPD. MRA HEAD FINDINGS Anterior circulation: Normal flow related enhancement of the included cervical, petrous, cavernous and supra clinoid internal carotid arteries. Patent anterior communicating artery. Normal flow related enhancement of the anterior and middle cerebral arteries, including more distal segments. No large vessel occlusion, high-grade stenosis, abnormal luminal irregularity, aneurysm. Posterior circulation: LEFT vertebral artery is dominant. Basilar artery is patent, with normal  flow related enhancement of the main branch vessels. Tiny RIGHT posterior communicating artery is present. Normal flow related enhancement of the posterior cerebral arteries. Mild stenosis LEFT P1-2 junction. No large vessel occlusion, high-grade stenosis, abnormal luminal irregularity, aneurysm. IMPRESSION: MRI HEAD: No acute intracranial process, specifically no acute ischemia. Moderate to severe white matter changes compatible with chronic small vessel ischemic disease. Susceptibility artifact in a pattern most commonly seen with chronic hypertension. Old LEFT basal ganglia and thalamus lacunar infarcts. MRA HEAD: No acute large vessel occlusion or high-grade stenosis. Mild stenosis LEFT LEFT P1-2 junction. Electronically Signed   By: Elon Alas M.D.   On: 06/17/2015 23:56    Scheduled Meds: .  stroke: mapping our early stages of recovery book   Does not apply Once  . aspirin  300 mg Rectal Daily   Or  . aspirin  325 mg Oral Daily  . enoxaparin (LOVENOX) injection  40 mg Subcutaneous Q24H  . ferrous sulfate  325 mg Oral TID WC  . furosemide  40 mg Oral BID  . gemfibrozil  600 mg Oral BID AC  . insulin aspart  0-15 Units Subcutaneous TID WC  . insulin glargine  50 Units Subcutaneous Daily  . losartan  100 mg Oral Daily  . metoprolol  25 mg Oral BID  . multivitamin with minerals  1 tablet Oral Daily  . omega-3 acid ethyl esters  2 capsule Oral Daily  . pantoprazole  40 mg Oral Daily  . potassium chloride SA  20 mEq Oral BID  . pyridOXINE  100 mg Oral Daily   Continuous Infusions:  Antibiotics Given (last 72 hours)    None      Principal Problem:   TIA (transient ischemic attack) Active Problems:   Hypertension   Dyslipidemia   Diabetes mellitus type II, uncontrolled ([place])    Time spent: 25 min    [facility] Pager [phone]. If 7PM-7AM, please contact night-coverage at [email or web address], password [facility] 06/18/2015, 1:02 PM

## 2015-06-18 NOTE — Progress Notes (Signed)
Occupational Therapy Evaluation Patient Details Name: Rebecca Perkins MRN: [id number] DOB: 12/02/1928 Today's Date: 06/18/2015    History of Present Illness Rebecca Perkins is a 81 y.o. female with h/o DM2 on insulin, HTN, HLD. Patient presents to ED after an episode of difficulty getting the correct words out. Symptoms lasted 45 mins per family before resolution per family. TIA.   Clinical Impression   Pt appears at baseline level of function regarding mobility and ADL. Pt safe to D/C home when medically stable. Educated pt on warning signs/symptoms of CVA. Using FAST. Family present. Given handout on reducing risk of falls at home. OT signing off.     Follow Up Recommendations  No OT follow up;Supervision - Intermittent    Equipment Recommendations  None recommended by OT    Recommendations for Other Services       Precautions / Restrictions Precautions Precautions: None      Mobility Bed Mobility Overal bed mobility: Modified Independent                Transfers Overall transfer level: Modified independent                    Balance     Sitting balance-Leahy Scale: Good                                      ADL Overall ADL's : At baseline                                             Vision Vision Assessment?: No apparent visual deficits   Perception     Praxis Praxis Praxis tested?: Within functional limits    Pertinent Vitals/Pain Pain Assessment: No/denies pain     Hand Dominance Right   Extremity/Trunk Assessment Upper Extremity Assessment Upper Extremity Assessment: Overall WFL for tasks assessed   Lower Extremity Assessment Lower Extremity Assessment: Defer to PT evaluation   Cervical / Trunk Assessment Cervical / Trunk Assessment: Normal   Communication Communication Communication: No difficulties   Cognition Arousal/Alertness: Awake/alert Behavior During Therapy: WFL for tasks  assessed/performed Overall Cognitive Status: Within Functional Limits for tasks assessed                     General Comments       Exercises       Shoulder Instructions      Home Living Family/patient expects to be discharged to:: Private residence Living Arrangements: Spouse/significant other Available Help at Discharge: Family;Available 24 hours/day Type of Home: House Home Access: Stairs to enter [employer] of Steps: 3 Entrance Stairs-Rails: Left;Right;Can reach both Home Layout: One level     Bathroom Shower/Tub: Tub/shower unit Shower/tub characteristics: Architectural technologist: Standard Bathroom Accessibility: Yes How Accessible: Accessible via walker Home Equipment: [place] - single point;Walker - 2 wheels   Additional Comments: Planning on getting bathroom re-done to make handicapped accesible      Prior Functioning/Environment Level of Independence: Independent             OT Diagnosis: Other (comment) (communication deficits)   OT Problem List:     OT Treatment/Interventions:      OT Goals(Current goals can be found in the care plan section) Acute Rehab OT Goals Patient Stated Goal:  To go home OT Goal Formulation: All assessment and education complete, DC therapy  OT Frequency:     Barriers to D/C:            Co-evaluation              End of Session Nurse Communication: Mobility status  Activity Tolerance: Patient tolerated treatment well Patient left: in bed;with call bell/phone within reach;with family/visitor present   Time: [id number] OT Time Calculation (min): 18 min Charges:  OT General Charges $OT Visit: 1 Procedure OT Evaluation $Initial OT Evaluation Tier I: 1 Procedure G-Codes: OT G-codes **NOT FOR INPATIENT CLASS** Functional Assessment Tool Used: clinical judgement Functional Limitation: Self care Self Care Current Status [id number]): 0 percent impaired, limited or restricted Self Care Goal Status  [id number]): 0 percent impaired, limited or restricted Self Care Discharge Status [id number]): 0 percent impaired, limited or restricted  Merari Pion,HILLARY 06/18/2015, 5:15 PM   [facility], OTR/L  [phone] 06/18/2015

## 2015-06-18 NOTE — Care Management Note (Signed)
Case Management Note  Patient Details  Name: Rebecca Perkins MRN: [id number] Date of Birth: 03-17-1929  Subjective/Objective:                  Patient admitted with TIA. Negative MRI.  Action/Plan: Awaiting PT/OT recommendations. CM will continue to follow for discharge needs.   Expected Discharge Date:                  Expected Discharge Plan:     In-House Referral:     Discharge planning Services     Post Acute Care Choice:    Choice offered to:     DME Arranged:    DME Agency:     HH Arranged:    HH Agency:     Status of Service:  In process, will continue to follow  Medicare Important Message Given:    Date Medicare IM Given:    Medicare IM give by:    Date Additional Medicare IM Given:    Additional Medicare Important Message give by:     If discussed at [place] of Stay Meetings, dates discussed:    Additional Comments:  Pollie Friar, RN 06/18/2015, 9:50 AM

## 2015-06-18 NOTE — Care Management Note (Signed)
Case Management Note  Patient Details  Name: CRISTINA CENICEROS MRN: [id number] Date of Birth: Jul 14, 1929  Subjective/Objective:                    Action/Plan: Plan is for home health services at discharge. CM met with the patient and provided her a list of home health agencies in the [facility] area. The patient states she uses [facility] for her husband and would like to use them also. Miranda with [facility] notified and accepted the referral. CM will continue to follow for further discharge needs.  Expected Discharge Date:                  Expected Discharge Plan:  [place]  In-House Referral:     Discharge planning Services  CM Consult  Post Acute Care Choice:    Choice offered to:  Patient  DME Arranged:    DME Agency:     HH Arranged:  PT, OT HH Agency:  [facility]  Status of Service:  In process, will continue to follow  Medicare Important Message Given:    Date Medicare IM Given:    Medicare IM give by:    Date Additional Medicare IM Given:    Additional Medicare Important Message give by:     If discussed at [place] of Stay Meetings, dates discussed:    Additional Comments:  Pollie Friar, RN 06/18/2015, 11:35 AM

## 2015-06-18 NOTE — Progress Notes (Signed)
PT note: Late entry for modified Rankin  06/18/15 1021  Modified Rankin (Stroke Patients Only)  Pre-Morbid Rankin Score 0  Modified Rankin 0  Rebecca Perkins [name], [place] Pager [phone] 06/18/2015

## 2015-06-18 NOTE — Evaluation (Signed)
Speech Language Pathology Evaluation Patient Details Name: Rebecca Perkins MRN: [id number] DOB: 1929-07-10 Today's Date: 06/18/2015 Time: 1025-1040 SLP Time Calculation (min) (ACUTE ONLY): 15 min  Problem List:  Patient Active Problem List   Diagnosis Date Noted  . TIA (transient ischemic attack) 06/17/2015  . Osteoporosis 02/18/2015  . Normocytic anemia 12/13/2013  . Diabetes mellitus type II, uncontrolled ([place]) 12/10/2013  . Pneumonia 12/10/2013  . Sepsis ([place]) 12/09/2013  . Type 2 diabetes mellitus with insulin deficiency ([place])   . Hypertension   . Hypertriglyceridemia   . Dyslipidemia    Past Medical History:  Past Medical History  Diagnosis Date  . Diabetes mellitus   . Hypertension   . Dyslipidemia   . Varicose veins   . Migraines   . GERD (gastroesophageal reflux disease)   . RSD (reflex sympathetic dystrophy) 2007    R wrist/hand following fx  . Anemia   . Osteoporosis    Past Surgical History:  Past Surgical History  Procedure Laterality Date  . Umbilical hernia repair    . Breast cyst excision    . Tubal ligation    . Cholecystectomy  1989  . Appendectomy  1966  . Abdominal hysterectomy  1988  . Several benign cyst removed      last 1 in 1972  . Cataract extraction  10/2009   HPI:  Pt admitted 06/17/15 due to transient word finding difficulty. PMH significant for DM, GERD. MRI and CT negative for acute findings.    Assessment / Plan / Recommendation Clinical Impression   Pt word finding difficulty has resolved. Com/cog is at baseline. PT was encouraged to notify RN, MD if she has increased difficulty in the future.     SLP Assessment  Patient does not need any further Speech Language Pathology Services    Follow Up Recommendations    n/a   Frequency and Duration   n/a        SLP Evaluation Prior Functioning  Cognitive/Linguistic Baseline: Within functional limits Type of Home: House Available Help at Discharge: Family;Available 24  hours/day Vocation: Retired   Associate Professor  Overall Cognitive Status: Within Functional Limits for tasks assessed Arousal/Alertness: Awake/alert Orientation Level: Oriented X4 Attention: Selective Selective Attention: Appears intact Memory: Appears intact Awareness: Appears intact Problem Solving: Appears intact Safety/Judgment: Appears intact    Comprehension  Auditory Comprehension Overall Auditory Comprehension: Appears within functional limits for tasks assessed Reading Comprehension Reading Status: Not tested    Expression Expression Primary Mode of Expression: Verbal Verbal Expression Overall Verbal Expression: Appears within functional limits for tasks assessed Written Expression Dominant Hand: Right Written Expression: Not tested   Oral / Motor Oral Motor/Sensory Function Overall Oral Motor/Sensory Function: Within functional limits Motor Speech Overall Motor Speech: Appears within functional limits for tasks assessed    Shonna Chock 06/18/2015, 10:44 AM  [facility] [address]. Quentin Ore [place], [place] [phone]

## 2015-06-18 NOTE — Evaluation (Signed)
Physical Therapy Evaluation Patient Details Name: Rebecca Perkins MRN: [id number] DOB: 02/22/1929 Today's Date: 06/18/2015   History of Present Illness  Rebecca Perkins is a 81 y.o. female with h/o DM2 on insulin, HTN, HLD. Patient presents to ED after an episode of difficulty getting the correct words out. Symptoms lasted 45 mins per family before resolution per family.  Clinical Impression  Pt admitted with above diagnosis. Pt currently with functional limitations due to the deficits listed below (see PT Problem List).  Pt will benefit from skilled PT to increase their independence and safety with mobility to allow discharge to the venue listed below.  Pt able to ambulate in hallway without AD with min/guard with slight unsteadiness due to pt report of weakness in legs and peripheral neuropathy.  Pt will have family that can A as needed, but recommend HHPT for home assessment and continued balance training as pt is primary caregiver for her husband, and needs to reach maximum level of mobility. Pt reports last fall was in in 2007.  No DME needed.     Follow Up Recommendations Home health PT    Equipment Recommendations  None recommended by PT    Recommendations for Other Services       Precautions / Restrictions Precautions Precautions: Fall      Mobility  Bed Mobility Overal bed mobility: Modified Independent                Transfers Overall transfer level: Needs assistance   Transfers: Sit to/from Stand Sit to Stand: Supervision         General transfer comment: S for steadying and to slow down  Ambulation/Gait Ambulation/Gait assistance: Min guard Ambulation Distance (Feet): 125 Feet Assistive device: None Gait Pattern/deviations: Decreased step length - right;Decreased step length - left;Drifts right/left     General Gait Details: Slight unsteadiness noted, but no LOB. Pt reports hx of weakness in knees and peripheral neuropathy in feet.  Pt feels  she is close to baseline, but not quite.  Stairs            Wheelchair Mobility    Modified Rankin (Stroke Patients Only)       Balance Overall balance assessment: Needs assistance   Sitting balance-Leahy Scale: Good       Standing balance-Leahy Scale: Fair Standing balance comment: good static balance with external perturbations and fair dynamic Single Leg Stance - Right Leg: 2 Single Leg Stance - Left Leg: 2           High Level Balance Comments: only 2 seconds SLS B'ly.  External perturbations with no LOB.             Pertinent Vitals/Pain Pain Assessment: No/denies pain    Home Living Family/patient expects to be discharged to:: Private residence Living Arrangements: Spouse/significant other (Pt is caregiver for spouse) Available Help at Discharge: Family;Available 24 hours/day Type of Home: House Home Access: Stairs to enter Entrance Stairs-Rails: Left;Right;Can reach both Entrance Stairs-Number of Steps: 3 Home Layout: One level Home Equipment: Cane - single point;Walker - 2 wheels Additional Comments: Planning on getting bathroom re-done to make handicapped accesible    Prior Function Level of Independence: Independent         Comments: Occasional use of cane when outside, but not all the time     Hand Dominance        Extremity/Trunk Assessment   Upper Extremity Assessment: Defer to OT evaluation  Lower Extremity Assessment: Overall WFL for tasks assessed;RLE deficits/detail;LLE deficits/detail         Communication   Communication: No difficulties  Cognition Arousal/Alertness: Awake/alert Behavior During Therapy: WFL for tasks assessed/performed Overall Cognitive Status: Within Functional Limits for tasks assessed                      General Comments General comments (skin integrity, edema, etc.): Pt reports a lot of stiffness in AM.  Educated on heel slides, ankle pumps, and hip abd to do in bed in  the AM to warm up LE before getting up.    Exercises        Assessment/Plan    PT Assessment Patient needs continued PT services  PT Diagnosis Difficulty walking   PT Problem List Decreased balance;Decreased mobility  PT Treatment Interventions Gait training;Functional mobility training;Therapeutic activities;Therapeutic exercise;Stair training;Balance training   PT Goals (Current goals can be found in the Care Plan section) Acute Rehab PT Goals Patient Stated Goal: To go home PT Goal Formulation: With patient Time For Goal Achievement: 06/25/15 Potential to Achieve Goals: Good    Frequency Min 3X/week   Barriers to discharge        Co-evaluation               End of Session Equipment Utilized During Treatment: Gait belt Activity Tolerance: Patient tolerated treatment well Patient left: in chair;with call bell/phone within reach Nurse Communication: Mobility status    Functional Assessment Tool Used: clinical judgement and objective findings. Functional Limitation: Mobility: Walking and moving around Mobility: Walking and Moving Around Current Status [phone]): At least 1 percent but less than 20 percent impaired, limited or restricted Mobility: Walking and Moving Around Goal Status [phone]): At least 1 percent but less than 20 percent impaired, limited or restricted    Time: 0927-0946 PT Time Calculation (min) (ACUTE ONLY): 19 min   Charges:   PT Evaluation $Initial PT Evaluation Tier I: 1 Procedure     PT G Codes:   PT G-Codes **NOT FOR INPATIENT CLASS** Functional Assessment Tool Used: clinical judgement and objective findings. Functional Limitation: Mobility: Walking and moving around Mobility: Walking and Moving Around Current Status [phone]): At least 1 percent but less than 20 percent impaired, limited or restricted Mobility: Walking and Moving Around Goal Status [phone]): At least 1 percent but less than 20 percent impaired, limited or restricted     [facility] LUBEC[facility] 06/18/2015, 10:23 AM

## 2015-06-18 NOTE — Progress Notes (Signed)
  Echocardiogram 2D Echocardiogram has been performed.  Rebecca Perkins 06/18/2015, 2:08 PM

## 2015-06-18 NOTE — Progress Notes (Signed)
STROKE TEAM PROGRESS NOTE   HISTORY Rebecca Perkins is an 81 y.o. female with a 15 min episode of unable to speak the correct words out, winessed by her family. No other focal neuro symptoms at that time. Sx resolved in 10-15 min.   No residual deficits now.   Patient was not administered TPA secondary to resolved deficits. She was admitted for further evaluation and treatment.   SUBJECTIVE (INTERVAL HISTORY) Her 2 sons (including one who works at [employer]) is at the bedside.  Overall she feels her condition is completely resolved.    OBJECTIVE Temp:  [98.2 F (36.8 C)-98.6 F (37 C)] 98.2 F (36.8 C) (11/16 1010) Pulse Rate:  [58-72] 58 (11/16 1010) Cardiac Rhythm:  [-] Heart block (11/16 0800) Resp:  [15-22] 15 (11/16 1010) BP: (118-179)/(42-71) 144/49 mmHg (11/16 1010) SpO2:  [94 %-100 %] 96 % (11/16 1010) Weight:  [70.171 kg (154 lb 11.2 oz)] 70.171 kg (154 lb 11.2 oz) (11/15 2051)  CBC:  Recent Labs Lab 06/17/15 1810 06/17/15 1834  WBC 6.1  --   NEUTROABS 3.3  --   HGB 11.8* 11.9*  HCT 35.2* 35.0*  MCV 84.0  --   PLT 200  --     Basic Metabolic Panel:  Recent Labs Lab 06/17/15 1810 06/17/15 1834  NA 140 141  K 4.1 4.1  CL 103 106  CO2 24  --   GLUCOSE 135* 138*  BUN 22* 24*  CREATININE 1.17* 1.10*  CALCIUM 9.9  --     Lipid Panel:    Component Value Date/Time   CHOL 174 06/18/2015 0532   TRIG 337* 06/18/2015 0532   HDL 26* 06/18/2015 0532   CHOLHDL 6.7 06/18/2015 0532   VLDL 67* 06/18/2015 0532   LDLCALC 81 06/18/2015 0532   HgbA1c:  Lab Results  Component Value Date   HGBA1C 8.7* 11/28/2014   Urine Drug Screen:    Component Value Date/Time   LABOPIA NONE DETECTED 06/18/2015 0712   COCAINSCRNUR NONE DETECTED 06/18/2015 0712   LABBENZ NONE DETECTED 06/18/2015 0712   AMPHETMU NONE DETECTED 06/18/2015 0712   THCU NONE DETECTED 06/18/2015 0712   LABBARB NONE DETECTED 06/18/2015 0712      IMAGING  Ct Head Wo Contrast 06/17/2015   No acute intracranial findings. Chronic ischemic microvascular disease in minimal age related atrophic change.   MRI HEAD 06/17/2015  : No acute intracranial process, specifically no acute ischemia. Moderate to severe white matter changes compatible with chronic small vessel ischemic disease. Susceptibility artifact in a pattern most commonly seen with chronic hypertension. Old LEFT basal ganglia and thalamus lacunar infarcts.   MRA HEAD 06/17/2015  : No acute large vessel occlusion or high-grade stenosis. Mild stenosis LEFT LEFT P1-2 junction.    PHYSICAL EXAM Pleasant elderly Caucasian lady not in distress. . Afebrile. Head is nontraumatic. Neck is supple without bruit.    Cardiac exam no murmur or gallop. Lungs are clear to auscultation. Distal pulses are well felt. Neurological Exam ;  Awake  Alert oriented x 3. Normal speech and language.eye movements full without nystagmus.fundi were not visualized. Vision acuity and fields appear normal. Hearing is normal. Palatal movements are normal. Face symmetric. Tongue midline. Normal strength, tone, reflexes and coordination. Normal sensation. Gait deferred.  ASSESSMENT/PLAN Ms. Rebecca Perkins is a 81 y.o. female with history of DM2 on insulin, HTN, HLD presenting with expressive aphasia. She did not receive IV t-PA due to symptoms resolved.   L brain TIA  Resultant  Expressive aphaisa which lasted 30-40 mins, no resolved  Denies HA yesterday.   MRI  No acute stroke  MRA  No large vessel stenosis  Carotid Doppler  pending   2D Echo  pending   LDL 81  HgbA1c pending  Lovenox 40 mg sq daily for VTE prophylaxis  Diet heart healthy/carb modified Room service appropriate?: Yes; Fluid consistency:: Thin  aspirin 81 mg daily prior to admission, now on aspirin 81 mg daily. Recommended change to plavix 75 mg daily vs PARFAIT research trial. Family is considering their options.  Ongoing aggressive stroke risk factor  management  Therapy recommendations:  No therapy needs  Disposition:  Anticipate return home (lives alone, independent, drives)  Hypertension  Stable  Hyperlipidemia  Home meds:  Lovaza,  resumed in hospital  LDL 81, goal < 70  Intolerant to statins in past  Diabetes  HgbA1c pending , goal < 7.0  Other Stroke Risk Factors  Advanced age  Family hx stroke (mother, brother)  Migraines - Hx HA, bad a times per pt. Complains of L posterior HA. No HA yesterday  Hospital day #   [place] for Pager information 06/18/2015 11:04 AM  I have personally examined this patient, reviewed notes, independently viewed imaging studies, participated in medical decision making and plan of care. I have made any additions or clarifications directly to the above note. Agree with note above. She presented with transient episode of expressive aphasia and dysarthria likely due to a left hemispheric TIA patient remains at risk for neurological worsening, recurrent stroke, TIA and needs ongoing stroke evaluation and aggressive risk factor modification. I had a long discussion with the patient and her 2 sons at the bedside about her presentation, plan of care, secondary prevention strategies and answered questions. Patient was advised to consider possible participation in the [facility] Psychiatric TIA trial but she refused. Recommend he change aspirin to Plavix for stroke prevention. Antony Contras, MD Medical Director [facility] Pager: [phone] 06/18/2015 7:21 PM    To contact Stroke Continuity provider, please refer to [email or web address] After hours, contact General Neurology

## 2015-06-19 ENCOUNTER — Observation Stay (HOSPITAL_BASED_OUTPATIENT_CLINIC_OR_DEPARTMENT_OTHER): Payer: Medicare Other

## 2015-06-19 DIAGNOSIS — G451 Carotid artery syndrome (hemispheric): Secondary | ICD-10-CM

## 2015-06-19 DIAGNOSIS — Z794 Long term (current) use of insulin: Secondary | ICD-10-CM | POA: Diagnosis not present

## 2015-06-19 DIAGNOSIS — E1165 Type 2 diabetes mellitus with hyperglycemia: Secondary | ICD-10-CM | POA: Diagnosis not present

## 2015-06-19 DIAGNOSIS — I10 Essential (primary) hypertension: Secondary | ICD-10-CM | POA: Diagnosis not present

## 2015-06-19 LAB — GLUCOSE, CAPILLARY
Glucose-Capillary: 116 mg/dL — ABNORMAL HIGH (ref 65–99)
Glucose-Capillary: 185 mg/dL — ABNORMAL HIGH (ref 65–99)

## 2015-06-19 LAB — HEMOGLOBIN A1C
Hgb A1c MFr Bld: 7.7 % — ABNORMAL HIGH (ref 4.8–5.6)
Mean Plasma Glucose: 174 mg/dL

## 2015-06-19 MED ORDER — METOPROLOL TARTRATE 50 MG PO TABS: ORAL_TABLET | ORAL | Status: DC

## 2015-06-19 MED ORDER — INSULIN GLARGINE 100 UNIT/ML ~~LOC~~ SOLN: SUBCUTANEOUS | Status: DC

## 2015-06-19 MED ORDER — CLOPIDOGREL BISULFATE 75 MG PO TABS: 75.0000 mg | ORAL_TABLET | Freq: Every day | ORAL | Status: DC

## 2015-06-19 NOTE — Progress Notes (Signed)
Discharge orders received, Pt for discharge home today with home health PT per [place]. IV d/c'd. D/c instructions and RX given with verbalized understanding. Family at bedside to assist patient with discharge. Staff bought pt downstairs via wheelchair.

## 2015-06-19 NOTE — Progress Notes (Signed)
VASCULAR LAB PRELIMINARY  PRELIMINARY  PRELIMINARY  PRELIMINARY  Carotid duplex completed.    Preliminary report:  1-39% ICA stenosis.  Vertebral artery flow is antegrade.   Lovie Agresta, RVT 06/19/2015, 8:58 AM

## 2015-06-19 NOTE — Discharge Summary (Signed)
Physician Discharge Summary  Rebecca Perkins BSJ:[id number] DOB: 05/17/1929 DOA: 06/17/2015  PCP: Odette Fraction, MD  Admit date: 06/17/2015 Discharge date: 06/19/2015  Time spent: 25 minutes  Recommendations for Outpatient Follow-up:  1. FLP 6 weeks   Discharge Diagnoses:  Principal Problem:   TIA (transient ischemic attack) Active Problems:   Hypertension   Dyslipidemia   Diabetes mellitus type II, uncontrolled ([place])   Discharge Condition: improved  Diet recommendation: cardiac/diabetic  Filed Weights   06/17/15 2051  Weight: 70.171 kg (154 lb 11.2 oz)    History of present illness:  Rebecca Perkins is a 81 y.o. female with h/o DM2 on insulin, HTN, HLD. Patient presents to ED after an episode of difficulty getting the correct words out. Symptoms lasted 45 mins per family before resolution per family. NIH is 0 on presentation to the ED (symptoms resolved by time she got to ED) so no TPA given  Hospital Course:   TIA - MRI negative Echo/carotid:Carotid duplex completed.  Preliminary report: 1-39% ICA stenosis. Vertebral artery flow is antegrade.  FLP- LDL 81-- allergic to statins- needs dietary modifications, HgbA1C plavix PT- home health  HTN - continue home meds  HLD - continue home meds  DM2 - Continue lantus SSI med dose AC/HS HgbA1C: 7.7  Procedures:    Consultations:  neuro  Discharge Exam: Filed Vitals:   06/19/15 1031  BP: 142/54  Pulse: 58  Temp: 97.5 F (36.4 C)  Resp: 16      Discharge Instructions   Discharge Instructions    Diet - low sodium heart healthy    Complete by:  As directed      Diet Carb Modified    Complete by:  As directed      Discharge instructions    Complete by:  As directed   Home health     Increase activity slowly    Complete by:  As directed           Current Discharge Medication List    START taking these medications   Details  clopidogrel (PLAVIX) 75 MG tablet Take 1  tablet (75 mg total) by mouth daily. Qty: 30 tablet, Refills: 0      CONTINUE these medications which have CHANGED   Details  insulin glargine (LANTUS) 100 UNIT/ML injection INJECT 50 UNITS UNDER THE SKIN EVERY DAY. Qty: 20 mL, Refills: 3    metoprolol (LOPRESSOR) 50 MG tablet TAKE 1/2 TABLET  BY MOUTH TWICE DAILY Qty: 180 tablet, Refills: 0      CONTINUE these medications which have NOT CHANGED   Details  Calcium Carbonate-Vitamin D (CALTRATE 600+D PO) Take 1 tablet by mouth 2 (two) times daily.    ferrous sulfate 325 (65 FE) MG EC tablet Take 1 tablet (325 mg total) by mouth 3 (three) times daily with meals. Qty: 90 tablet, Refills: 11    furosemide (LASIX) 40 MG tablet Take 40 mg by mouth 2 (two) times daily.    gemfibrozil (LOPID) 600 MG tablet TAKE 1 TABLET BY MOUTH TWICE DAILY BEFORE A MEAL Qty: 60 tablet, Refills: 5    losartan (COZAAR) 100 MG tablet TAKE 1 TABLET BY MOUTH EVERY DAY Qty: 30 tablet, Refills: 5    Multiple Vitamin (MULTIVITAMIN) tablet Take 1 tablet by mouth daily.      omega-3 acid ethyl esters (LOVAZA) 1 G capsule TAKE 2 CAPSULES BY MOUTH DAILY Qty: 60 capsule, Refills: 3    omeprazole (PRILOSEC) 20 MG capsule TAKE 1 CAPSULE BY  MOUTH EVERY DAY Qty: 30 capsule, Refills: 11    potassium chloride SA (K-DUR,KLOR-CON) 20 MEQ tablet TAKE 1 TABLET BY MOUTH TWICE DAILY Qty: 180 tablet, Refills: 0    Pyridoxine HCl (VITAMIN B-6) 100 MG tablet Take 100 mg by mouth daily.      B-D ULTRAFINE III SHORT PEN 31G X 8 MM MISC USE AS DIRECTED Qty: 100 each, Refills: 5    Blood Glucose Monitoring Suppl (ONE TOUCH ULTRA SYSTEM KIT) W/DEVICE KIT 1 kit by Does not apply route once. Tests Blood sugar before meals and at bedtime Qty: 1 each, Refills: 0    glucose blood test strip 1 each by Other route 4 (four) times daily -  before meals and at bedtime. Use as instructed Qty: 150 each, Refills: 12    insulin aspart (NOVOLOG FLEXPEN) 100 UNIT/ML FlexPen Inject 5 units  at breakfast  and 7 units at lunch and supper Qty: 15 mL, Refills: 3    !! INSULIN SYRINGE 1CC/29G 29G X 1/2" 1 ML MISC Refills: 6    !! INSULIN SYRINGE 1CC/29G 29G X 1/2" 1 ML MISC USE AS DIRECTED. Qty: 100 each, Refills: 5     !! - Potential duplicate medications found. Please discuss with provider.    STOP taking these medications     aspirin EC 81 MG tablet        Allergies  Allergen Reactions  . Lipitor [Atorvastatin Calcium] Diarrhea  . Niacin And Related Hives  . Statins Diarrhea  . Macrobid [Nitrofurantoin Macrocrystal] Other (See Comments)    unknown      The results of significant diagnostics from this hospitalization (including imaging, microbiology, ancillary and laboratory) are listed below for reference.    Significant Diagnostic Studies: Ct Head Wo Contrast  06/17/2015  CLINICAL DATA:  Occipital headache at night 2 months. Intermittent slurred speech today. EXAM: CT HEAD WITHOUT CONTRAST TECHNIQUE: Contiguous axial images were obtained from the base of the skull through the vertex without intravenous contrast. COMPARISON:  05/23/2012 FINDINGS: Ventricles, cisterns and other CSF spaces are within normal. There is mild age related atrophy. There is moderate chronic ischemic microvascular disease. Possible old lacune infarct versus perivascular space just inferior to the right lentiform nucleus. No evidence of focal mass, mass effect, shift of midline structures or acute hemorrhage. No evidence of acute infarction. Remaining bones and soft tissues are within normal. IMPRESSION: No acute intracranial findings. Chronic ischemic microvascular disease in minimal age related atrophic change. Electronically Signed   By: Marin Olp M.D.   On: 06/17/2015 19:05   Mr Brain Wo Contrast  06/17/2015  CLINICAL DATA:  45 minutes episode of aphasia. History of diabetes, hypertension, dyslipidemia, migraines. EXAM: MRI HEAD WITHOUT CONTRAST MRA HEAD WITHOUT CONTRAST TECHNIQUE:  Multiplanar, multiecho pulse sequences of the brain and surrounding structures were obtained without intravenous contrast. Angiographic images of the head were obtained using MRA technique without contrast. COMPARISON:  Chest CT head June 17, 2015 at 1839 hours. FINDINGS: MRI HEAD FINDINGS The ventricles and sulci are normal for patient's age. No abnormal parenchymal signal, mass lesions, mass effect. No reduced diffusion to suggest acute ischemia. Scattered subcentimeter foci of susceptibility artifact though the peripheral brain and LEFT thalamus. Old LEFT basal ganglia and LEFT thalamus lacunar infarct. Patchy to confluent supratentorial and pontine white matter T2 hyperintensities. No abnormal extra-axial fluid collections. No extra-axial masses though, contrast enhanced sequences would be more sensitive. Normal major intracranial vascular flow voids seen at the skull base. Ocular globes and  orbital contents are unremarkable though not tailored for evaluation. No abnormal sellar expansion. Visualized paranasal sinuses and mastoid air cells are well-aerated. Mild nonspecific heterogeneous calvarial bone marrow signal. Craniocervical junction maintained. Patient is edentulous. Small amount of pannus about the odontoid process can be seen with CPPD. MRA HEAD FINDINGS Anterior circulation: Normal flow related enhancement of the included cervical, petrous, cavernous and supra clinoid internal carotid arteries. Patent anterior communicating artery. Normal flow related enhancement of the anterior and middle cerebral arteries, including more distal segments. No large vessel occlusion, high-grade stenosis, abnormal luminal irregularity, aneurysm. Posterior circulation: LEFT vertebral artery is dominant. Basilar artery is patent, with normal flow related enhancement of the main branch vessels. Tiny RIGHT posterior communicating artery is present. Normal flow related enhancement of the posterior cerebral arteries. Mild  stenosis LEFT P1-2 junction. No large vessel occlusion, high-grade stenosis, abnormal luminal irregularity, aneurysm. IMPRESSION: MRI HEAD: No acute intracranial process, specifically no acute ischemia. Moderate to severe white matter changes compatible with chronic small vessel ischemic disease. Susceptibility artifact in a pattern most commonly seen with chronic hypertension. Old LEFT basal ganglia and thalamus lacunar infarcts. MRA HEAD: No acute large vessel occlusion or high-grade stenosis. Mild stenosis LEFT LEFT P1-2 junction. Electronically Signed   By: Elon Alas M.D.   On: 06/17/2015 23:56   Mr Jodene Nam Head/brain Wo Cm  06/17/2015  CLINICAL DATA:  45 minutes episode of aphasia. History of diabetes, hypertension, dyslipidemia, migraines. EXAM: MRI HEAD WITHOUT CONTRAST MRA HEAD WITHOUT CONTRAST TECHNIQUE: Multiplanar, multiecho pulse sequences of the brain and surrounding structures were obtained without intravenous contrast. Angiographic images of the head were obtained using MRA technique without contrast. COMPARISON:  Chest CT head June 17, 2015 at 1839 hours. FINDINGS: MRI HEAD FINDINGS The ventricles and sulci are normal for patient's age. No abnormal parenchymal signal, mass lesions, mass effect. No reduced diffusion to suggest acute ischemia. Scattered subcentimeter foci of susceptibility artifact though the peripheral brain and LEFT thalamus. Old LEFT basal ganglia and LEFT thalamus lacunar infarct. Patchy to confluent supratentorial and pontine white matter T2 hyperintensities. No abnormal extra-axial fluid collections. No extra-axial masses though, contrast enhanced sequences would be more sensitive. Normal major intracranial vascular flow voids seen at the skull base. Ocular globes and orbital contents are unremarkable though not tailored for evaluation. No abnormal sellar expansion. Visualized paranasal sinuses and mastoid air cells are well-aerated. Mild nonspecific heterogeneous  calvarial bone marrow signal. Craniocervical junction maintained. Patient is edentulous. Small amount of pannus about the odontoid process can be seen with CPPD. MRA HEAD FINDINGS Anterior circulation: Normal flow related enhancement of the included cervical, petrous, cavernous and supra clinoid internal carotid arteries. Patent anterior communicating artery. Normal flow related enhancement of the anterior and middle cerebral arteries, including more distal segments. No large vessel occlusion, high-grade stenosis, abnormal luminal irregularity, aneurysm. Posterior circulation: LEFT vertebral artery is dominant. Basilar artery is patent, with normal flow related enhancement of the main branch vessels. Tiny RIGHT posterior communicating artery is present. Normal flow related enhancement of the posterior cerebral arteries. Mild stenosis LEFT P1-2 junction. No large vessel occlusion, high-grade stenosis, abnormal luminal irregularity, aneurysm. IMPRESSION: MRI HEAD: No acute intracranial process, specifically no acute ischemia. Moderate to severe white matter changes compatible with chronic small vessel ischemic disease. Susceptibility artifact in a pattern most commonly seen with chronic hypertension. Old LEFT basal ganglia and thalamus lacunar infarcts. MRA HEAD: No acute large vessel occlusion or high-grade stenosis. Mild stenosis LEFT LEFT P1-2 junction. Electronically Signed  By: Elon Alas M.D.   On: 06/17/2015 23:56    Microbiology: No results found for this or any previous visit (from the past 240 hour(s)).   Labs: Basic Metabolic Panel:  Recent Labs Lab 06/17/15 1810 06/17/15 1834  NA 140 141  K 4.1 4.1  CL 103 106  CO2 24  --   GLUCOSE 135* 138*  BUN 22* 24*  CREATININE 1.17* 1.10*  CALCIUM 9.9  --    Liver Function Tests:  Recent Labs Lab 06/17/15 1810  AST 25  ALT 18  ALKPHOS 91  BILITOT 0.6  PROT 6.9  ALBUMIN 3.7   No results for input(s): LIPASE, AMYLASE in the  last 168 hours. No results for input(s): AMMONIA in the last 168 hours. CBC:  Recent Labs Lab 06/17/15 1810 06/17/15 1834  WBC 6.1  --   NEUTROABS 3.3  --   HGB 11.8* 11.9*  HCT 35.2* 35.0*  MCV 84.0  --   PLT 200  --    Cardiac Enzymes: No results for input(s): CKTOTAL, CKMB, CKMBINDEX, TROPONINI in the last 168 hours. BNP: BNP (last 3 results) No results for input(s): BNP in the last 8760 hours.  ProBNP (last 3 results) No results for input(s): PROBNP in the last 8760 hours.  CBG:  Recent Labs Lab 06/18/15 1132 06/18/15 1701 06/18/15 2132 06/19/15 0629 06/19/15 1120  GLUCAP 145* 122* 197* 116* 185*       Signed:  JESSICA VANN  [facility] 06/19/2015, 1:03 PM

## 2015-06-19 NOTE — Progress Notes (Signed)
Patient discharging home today. Home health services set up yesterday with [facility]. Bedside RN updated.

## 2015-06-25 ENCOUNTER — Other Ambulatory Visit: Payer: PRIVATE HEALTH INSURANCE

## 2015-06-28 ENCOUNTER — Other Ambulatory Visit: Payer: Self-pay | Admitting: Family Medicine

## 2015-07-01 ENCOUNTER — Encounter: Payer: Medicare Other | Admitting: Family Medicine

## 2015-07-02 ENCOUNTER — Encounter: Payer: Self-pay | Admitting: Endocrinology

## 2015-07-02 ENCOUNTER — Ambulatory Visit (INDEPENDENT_AMBULATORY_CARE_PROVIDER_SITE_OTHER): Payer: Medicare Other | Admitting: Endocrinology

## 2015-07-02 VITALS — BP 138/68 | HR 67 | Temp 98.5°F | Resp 14 | Ht 64.0 in | Wt 149.6 lb

## 2015-07-02 DIAGNOSIS — Z794 Long term (current) use of insulin: Secondary | ICD-10-CM | POA: Diagnosis not present

## 2015-07-02 DIAGNOSIS — E1165 Type 2 diabetes mellitus with hyperglycemia: Secondary | ICD-10-CM

## 2015-07-02 DIAGNOSIS — E041 Nontoxic single thyroid nodule: Secondary | ICD-10-CM

## 2015-07-02 MED ORDER — VICTOZA 18 MG/3ML ~~LOC~~ SOPN: 1.2000 mg | PEN_INJECTOR | Freq: Every day | SUBCUTANEOUS | Status: DC

## 2015-07-02 NOTE — Patient Instructions (Addendum)
Check blood sugars on waking up 3  times a week Also check blood sugars about 2 hours after a meal and do this after different meals by rotation  Recommended blood sugar levels on waking up is 90-130 and about 2 hours after meal is 130-160  Please bring your blood sugar monitor to each visit, thank you  If am sugar stays <90 reduce Lantus to 45  Start VICTOZA injection as shown once daily at the same time of the day.  Dial the dose to 0.6 mg on the pen for the first week.  You may inject in the stomach, thigh or arm. You may experience nausea in the first few days which usually goes away.  You will feel fullness of the stomach with starting the medication and should try to keep the portions at meals small. After 1 week increase the dose to 1.2mg  daily if no nausea present.   If any questions or concerns are present call the office or the [place] helpline at [phone]. Visit [email or web address] for more useful information

## 2015-07-02 NOTE — Progress Notes (Signed)
Patient ID: Rebecca Perkins, female   DOB: Nov 24, 1928, 81 y.o.   MRN: [id number]            Reason for Appointment: Follow-up for Type 2 Diabetes  Referring physician: Pickard  History of Present Illness:          Diagnosis: Type 2 diabetes mellitus, date of diagnosis: 2006       Past history:  At the time of diagnosis she was feeling tired and weak but does not know what her initial blood sugar was. She probably was tried on metformin initially but because of GI side effects discussed not be continued Also did not tolerate Actos because of nausea. She thinks that she was started on insulin within a few months of her initial diagnosis  Not clear what insulin she was trying in the beginning but she thinks it was NovoLog; however for the last few years has been on Lantus only Her blood sugars have been persistently poorly controlled with A1c usually 8-9% about 2011 At initial consultation she was taking 60 units of Lantus insulin without any mealtime coverage Because of her tendency to low blood sugars including overnight she was told to reduce the dose to 50 units  Recent history:   INSULIN regimen is described as: 50 Lantus in am, NovoLog 0-5 at supper  She has not been seen in follow-up in 4/16 Although she has been repeatedly told to take Novolog consistently with her meals especially supper she has not been doing this in the past and more recently has not taken any Novolog at meals Usually has marked post prandial hyperglycemia A1c checked in her hospital stay was 7.7  Current blood sugars and problems identified:  She has stopped taking her NovoLog and not clear if she took it much after her last visit in April.  She still claims that she is afraid of low sugars at night and does not understand that he does not last all night long  She has had variable blood sugars in the mornings with only 3 good readings  Blood sugars in the mornings the last 3 days have been over 200 but  she has not checked readings after supper  She thinks her sugars are higher in the morning because of eating late and eating poorly, last night had fried food from a fast food restaurant.  She is not able to eat at home recently because of her husband being in the hospital  In mid November her blood sugars after supper were mostly in the 170-190, highest reading 397 on Thanksgiving day  No readings after lunch even though she is sometimes drinking regular soft drinks at that time Currently not able to exercise because of fatigue and dyspnea on exertion       Oral hypoglycemic drugs the patient is taking are:   none     Side effects from medications have been: Metformin, Actos: nausea, diarrhea    Compliance with the medical regimen: Fair  Hypoglycemia: None recently  Glucose monitoring:  done 1-2 times a day         Glucometer: One Touch.      Blood Glucose readings as above.    Mean values apply above for all meters except median for One Touch  PRE-MEAL Fasting Lunch Dinner Bedtime Overall  Glucose range:  77-306     166-397    Mean/median:  165     179   170     Self-care: The diet that the  patient has been following is: tries to limit portions .     Meals: 3 meals per day. Breakfast is variable, sometimes may have only some fruit with egg and other times may have a biscuit or toast.   Usually has an egg.  May sometimes have seafood, baked potato and hush puppies with dinner otherwise has relatively low amount of starch.   She will snack on peanut butter crackers which she will have regular soft drinks at lunch             Exercise: none because of shortness of breath and fatigue on exertion         Dietician visit, most recent none               Weight history: Highest weight has been 170  Wt Readings from Last 3 Encounters:  07/02/15 149 lb 9.6 oz (67.858 kg)  06/17/15 154 lb 11.2 oz (70.171 kg)  05/05/15 151 lb (68.493 kg)    Glycemic control:   Lab Results  Component  Value Date   HGBA1C 7.7* 06/18/2015   HGBA1C 8.7* 11/28/2014   HGBA1C 8.7* 07/24/2014   Lab Results  Component Value Date   MICROALBUR 0.8 05/05/2011   LDLCALC 81 06/18/2015   CREATININE 1.10* 06/17/2015         Medication List       This list is accurate as of: 07/02/15 12:59 PM.  Always use your most recent med list.               B-D ULTRAFINE III SHORT PEN 31G X 8 MM Misc  Generic drug:  Insulin Pen Needle  USE AS DIRECTED     CALTRATE 600+D PO  Take 1 tablet by mouth 2 (two) times daily.     clopidogrel 75 MG tablet  Commonly known as:  PLAVIX  Take 1 tablet (75 mg total) by mouth daily.     ferrous sulfate 325 (65 FE) MG EC tablet  Take 1 tablet (325 mg total) by mouth 3 (three) times daily with meals.     furosemide 40 MG tablet  Commonly known as:  LASIX  Take 40 mg by mouth 2 (two) times daily.     gemfibrozil 600 MG tablet  Commonly known as:  LOPID  TAKE 1 TABLET BY MOUTH TWICE DAILY BEFORE A MEAL     glucose blood test strip  1 each by Other route 4 (four) times daily -  before meals and at bedtime. Use as instructed     insulin aspart 100 UNIT/ML FlexPen  Commonly known as:  NOVOLOG FLEXPEN  Inject 5 units at breakfast  and 7 units at lunch and supper     insulin glargine 100 UNIT/ML injection  Commonly known as:  LANTUS  INJECT 50 UNITS UNDER THE SKIN EVERY DAY.     LANTUS 100 UNIT/ML injection  Generic drug:  insulin glargine  ADMINISTER 64 UNITS UNDER THE SKIN EVERY DAY     INSULIN SYRINGE 1CC/29G 29G X 1/2" 1 ML Misc     INSULIN SYRINGE 1CC/29G 29G X 1/2" 1 ML Misc  USE AS DIRECTED.     losartan 100 MG tablet  Commonly known as:  COZAAR  TAKE 1 TABLET BY MOUTH EVERY DAY     metoprolol 50 MG tablet  Commonly known as:  LOPRESSOR  TAKE 1/2 TABLET  BY MOUTH TWICE DAILY     multivitamin tablet  Take 1 tablet by mouth daily.  omega-3 acid ethyl esters 1 G capsule  Commonly known as:  LOVAZA  TAKE 2 CAPSULES BY MOUTH DAILY       omeprazole 20 MG capsule  Commonly known as:  PRILOSEC  TAKE 1 CAPSULE BY MOUTH EVERY DAY     ONE TOUCH ULTRA SYSTEM KIT W/DEVICE Kit  1 kit by Does not apply route once. Tests Blood sugar before meals and at bedtime     potassium chloride SA 20 MEQ tablet  Commonly known as:  K-DUR,KLOR-CON  TAKE 1 TABLET BY MOUTH TWICE DAILY     pyridOXINE 100 MG tablet  Commonly known as:  VITAMIN B-6  Take 100 mg by mouth daily.     VICTOZA 18 MG/3ML Sopn  Generic drug:  Liraglutide  Inject 0.2 mLs (1.2 mg total) into the skin daily. Inject once daily at the same time        Allergies:  Allergies  Allergen Reactions  . Lipitor [Atorvastatin Calcium] Diarrhea  . Niacin And Related Hives  . Statins Diarrhea  . Macrobid [Nitrofurantoin Macrocrystal] Other (See Comments)    unknown    Past Medical History  Diagnosis Date  . Diabetes mellitus   . Hypertension   . Dyslipidemia   . Varicose veins   . Migraines   . GERD (gastroesophageal reflux disease)   . RSD (reflex sympathetic dystrophy) 2007    R wrist/hand following fx  . Anemia   . Osteoporosis     Past Surgical History  Procedure Laterality Date  . Umbilical hernia repair    . Breast cyst excision    . Tubal ligation    . Cholecystectomy  1989  . Appendectomy  1966  . Abdominal hysterectomy  1988  . Several benign cyst removed      last 1 in 1972  . Cataract extraction  10/2009    Family History  Problem Relation Age of Onset  . Stroke Mother 42  . Hypertension Mother   . Clotting disorder Father   . Heart attack Father   . Arrhythmia Sister   . Stroke Brother   . Diabetes Neg Hx   . Thyroid disease Neg Hx     Social History:  reports that she has never smoked. She has never used smokeless tobacco. She reports that she does not drink alcohol or use illicit drugs.    Review of Systems   She had a TIA and is taking Plavix  Most recent eye exam was 10/15       Lipids: She is not on any statin  drugs, taking gemfibrozil       Lab Results  Component Value Date   CHOL 174 06/18/2015   HDL 26* 06/18/2015   LDLCALC 81 06/18/2015   LDLDIRECT 41.8 09/13/2011   TRIG 337* 06/18/2015   CHOLHDL 6.7 06/18/2015                   Thyroid:   Has complaints of significant fatigue On exam has a 3 cm nodule on the right side but she has refused to consider biopsy  Lab Results  Component Value Date   TSH 2.83 10/04/2014       The blood pressure has been managed with losartan and metoprolol, not taking amlodipine 5 mg         She has a history of Numbness in her feet and toes, mostly later in the day but better overnight.  She has had the symptoms over several months. Last foot  exam was in 2/16 showing sensory loss   LABS:  See hospital records  Physical Examination:  BP 138/68 mmHg  Pulse 67  Temp(Src) 98.5 F (36.9 C)  Resp 14  Ht $R'5\' 4"'jC$  (1.626 m)  Wt 149 lb 9.6 oz (67.858 kg)  BMI 25.67 kg/m2  SpO2 99%     ASSESSMENT /PLAN:  Diabetes type 2, uncontrolled    See history of present illness for detailed discussion of her current management and problems identified as well as blood sugar patterns  Her blood sugars are generally poorly controlled with mostly high postprandial readings but also recently high fasting readings Her blood sugar fluctuation is likely related to inconsistent and poor diet Surprisingly her A1c is better at 7.7 compared to previous visits and before her hospitalization her postprandial readings at night were not quite as high Since she has been steadfastly refusing to take Novolog at suppertime and does not understand the time course of action of this drug will give her a trial of a GLP-1 drug  Discussed with the patient the nature of GLP-1 drugs, the actions on various organ systems, how they benefit blood glucose control, as well as the benefit of weight loss and  increase satiety . Explained possible side effects especially nausea and vomiting  initially; discussed safety information in package insert.  Described the injection technique and dosage titration of Victoza  starting with 0.6 mg once a day at the same time for the first week and then increasing to 1.2 mg if no symptoms of nausea.  Educational brochure on Victoza and co-pay card given  Hopefully she will have better postprandial control with Victoza Discussed that she may need to reduce Lantus in the morning if fasting readings are consistently below 90  Also needs consultation with dietitian Discussed timing of glucose monitoring, blood sugar targets and adjustment of mealtime insulin   Patient Instructions  Check blood sugars on waking up 3  times a week Also check blood sugars about 2 hours after a meal and do this after different meals by rotation  Recommended blood sugar levels on waking up is 90-130 and about 2 hours after meal is 130-160  Please bring your blood sugar monitor to each visit, thank you  If am sugar stays <90 reduce Lantus to 45  Start VICTOZA injection as shown once daily at the same time of the day.  Dial the dose to 0.6 mg on the pen for the first week.  You may inject in the stomach, thigh or arm. You may experience nausea in the first few days which usually goes away.  You will feel fullness of the stomach with starting the medication and should try to keep the portions at meals small. After 1 week increase the dose to 1.$RemoveB'2mg'VvDiyXip$  daily if no nausea present.   If any questions or concerns are present call the office or the [place] helpline at [phone]. Visit [email or web address].wall.info/ for more useful information     Counseling time on subjects discussed above is over 50% of today's 25 minute visit  Verbon Giangregorio 07/02/2015, 12:59 PM   Note: This office note was prepared with Estate agent. Any transcriptional errors that result from this process are unintentional.

## 2015-07-09 ENCOUNTER — Other Ambulatory Visit: Payer: Self-pay | Admitting: Endocrinology

## 2015-07-09 ENCOUNTER — Other Ambulatory Visit: Payer: Self-pay | Admitting: Family Medicine

## 2015-07-15 ENCOUNTER — Ambulatory Visit
Admission: RE | Admit: 2015-07-15 | Discharge: 2015-07-15 | Disposition: A | Discharge status: Home / Self Care | Payer: Medicare Other | Source: Ambulatory Visit

## 2015-07-15 DIAGNOSIS — Z1231 Encounter for screening mammogram for malignant neoplasm of breast: Secondary | ICD-10-CM

## 2015-07-22 ENCOUNTER — Other Ambulatory Visit: Payer: Self-pay | Admitting: Family Medicine

## 2015-07-22 NOTE — Telephone Encounter (Signed)
Refill appropriate and filled per protocol. 

## 2015-07-30 ENCOUNTER — Ambulatory Visit: Payer: Medicare Other | Admitting: Endocrinology

## 2015-08-06 ENCOUNTER — Other Ambulatory Visit: Payer: Self-pay | Admitting: *Deleted

## 2015-08-06 MED ORDER — CLOPIDOGREL BISULFATE 75 MG PO TABS: 75.0000 mg | ORAL_TABLET | Freq: Every day | ORAL | Status: DC

## 2015-08-06 NOTE — Telephone Encounter (Signed)
Medication filled x1 with no refills.   Requires office visit before any further refills can be given.   Letter sent.  

## 2015-08-22 ENCOUNTER — Other Ambulatory Visit: Payer: Medicare Other

## 2015-08-22 DIAGNOSIS — M810 Age-related osteoporosis without current pathological fracture: Secondary | ICD-10-CM

## 2015-08-22 DIAGNOSIS — E785 Hyperlipidemia, unspecified: Secondary | ICD-10-CM

## 2015-08-22 DIAGNOSIS — E559 Vitamin D deficiency, unspecified: Secondary | ICD-10-CM

## 2015-08-22 DIAGNOSIS — E119 Type 2 diabetes mellitus without complications: Secondary | ICD-10-CM

## 2015-08-22 DIAGNOSIS — I10 Essential (primary) hypertension: Secondary | ICD-10-CM

## 2015-08-22 LAB — COMPLETE METABOLIC PANEL WITH GFR
ALT: 10 U/L (ref 6–29)
AST: 14 U/L (ref 10–35)
Albumin: 4.1 g/dL (ref 3.6–5.1)
Alkaline Phosphatase: 103 U/L (ref 33–130)
BUN: 32 mg/dL — ABNORMAL HIGH (ref 7–25)
CO2: 25 mmol/L (ref 20–31)
Calcium: 9.6 mg/dL (ref 8.6–10.4)
Chloride: 107 mmol/L (ref 98–110)
Creat: 1.35 mg/dL — ABNORMAL HIGH (ref 0.60–0.88)
GFR, Est African American: 42 mL/min — ABNORMAL LOW (ref >=60)
GFR, Est Non African American: 36 mL/min — ABNORMAL LOW (ref >=60)
Glucose, Bld: 163 mg/dL — ABNORMAL HIGH (ref 70–99)
Potassium: 4.7 mmol/L (ref 3.5–5.3)
Sodium: 143 mmol/L (ref 135–146)
Total Bilirubin: 0.3 mg/dL (ref 0.2–1.2)
Total Protein: 7.2 g/dL (ref 6.1–8.1)

## 2015-08-22 LAB — LIPID PANEL
Cholesterol: 208 mg/dL — ABNORMAL HIGH (ref 125–200)
HDL: 32 mg/dL — ABNORMAL LOW (ref >=46)
LDL Cholesterol: 102 mg/dL (ref <130)
Total CHOL/HDL Ratio: 6.5 Ratio — ABNORMAL HIGH (ref <=5.0)
Triglycerides: 372 mg/dL — ABNORMAL HIGH (ref <150)
VLDL: 74 mg/dL — ABNORMAL HIGH (ref <30)

## 2015-08-22 LAB — CBC WITH DIFFERENTIAL/PLATELET
Basophils Absolute: 0.1 10*3/uL (ref 0.0–0.1)
Basophils Relative: 1 % (ref 0–1)
Eosinophils Absolute: 0.2 10*3/uL (ref 0.0–0.7)
Eosinophils Relative: 3 % (ref 0–5)
HCT: 36.5 % (ref 36.0–46.0)
Hemoglobin: 12.2 g/dL (ref 12.0–15.0)
Lymphocytes Relative: 42 % (ref 12–46)
Lymphs Abs: 2.2 10*3/uL (ref 0.7–4.0)
MCH: 28.1 pg (ref 26.0–34.0)
MCHC: 33.4 g/dL (ref 30.0–36.0)
MCV: 84.1 fL (ref 78.0–100.0)
MPV: 9.7 fL (ref 8.6–12.4)
Monocytes Absolute: 0.6 10*3/uL (ref 0.1–1.0)
Monocytes Relative: 11 % (ref 3–12)
Neutro Abs: 2.3 10*3/uL (ref 1.7–7.7)
Neutrophils Relative %: 43 % (ref 43–77)
Platelets: 226 10*3/uL (ref 150–400)
RBC: 4.34 MIL/uL (ref 3.87–5.11)
RDW: 13.5 % (ref 11.5–15.5)
WBC: 5.3 10*3/uL (ref 4.0–10.5)

## 2015-08-23 LAB — VITAMIN D 25 HYDROXY (VIT D DEFICIENCY, FRACTURES): Vit D, 25-Hydroxy: 30 ng/mL (ref 30–100)

## 2015-08-23 LAB — HEMOGLOBIN A1C
Hgb A1c MFr Bld: 8.7 % — ABNORMAL HIGH (ref <5.7)
Mean Plasma Glucose: 203 mg/dL — ABNORMAL HIGH (ref <117)

## 2015-08-26 ENCOUNTER — Encounter: Payer: Self-pay | Admitting: Family Medicine

## 2015-08-26 ENCOUNTER — Ambulatory Visit (INDEPENDENT_AMBULATORY_CARE_PROVIDER_SITE_OTHER): Payer: Medicare Other | Admitting: Family Medicine

## 2015-08-26 VITALS — BP 128/68 | HR 74 | Temp 97.5°F | Resp 14 | Ht 64.0 in | Wt 144.0 lb

## 2015-08-26 DIAGNOSIS — Z0000 Encounter for general adult medical examination without abnormal findings: Secondary | ICD-10-CM | POA: Diagnosis not present

## 2015-08-26 MED ORDER — PRAVASTATIN SODIUM 20 MG PO TABS: 10.0000 mg | ORAL_TABLET | Freq: Every day | ORAL | Status: DC

## 2015-08-26 NOTE — Progress Notes (Signed)
Subjective:    Patient ID: Rebecca Perkins, female    DOB: [birth year], 82 y.o.   MRN: [id number]  HPI  Here for a CPE.  She has a history of a hysterectomy and therefore does not require a Pap smear. She has never had a colonoscopy and would be due for such however given her age she agrees that it is not prudent to proceed with a colonoscopy. Otherwise she is doing well. Her immunizations including her Pneumovax, Prevnar 13, and flu shot are up-to-date.  Mammogram was performed 12/16 and was normal.  DEXA 7/16 revelaed T score -2.1 in forearm.  In November, the patient was admitted to the hospital with a TIA. Echocardiogram revealed an ejection fraction of 50-55% but no cardioembolic source for stroke. Carotid Dopplers revealed 1-39% stenosis bilaterally in the internal carotid arteries. Her blood pressure today is excellent. She was switched to Plavix for secondary prevention of stroke and TIA. Unfortunately her blood sugar and her cholesterol remained poorly controlled. She has a history of intolerance to all statins. She has never tried pravastatin. Her endocrinologist recently started her on Victoza however the patient has not started it. Lab on 08/22/2015  Component Date Value Ref Range Status  . WBC 08/22/2015 5.3  4.0 - 10.5 K/uL Final  . RBC 08/22/2015 4.34  3.87 - 5.11 MIL/uL Final  . Hemoglobin 08/22/2015 12.2  12.0 - 15.0 g/dL Final  . HCT 08/22/2015 36.5  36.0 - 46.0 % Final  . MCV 08/22/2015 84.1  78.0 - 100.0 fL Final  . MCH 08/22/2015 28.1  26.0 - 34.0 pg Final  . MCHC 08/22/2015 33.4  30.0 - 36.0 g/dL Final  . RDW 08/22/2015 13.5  11.5 - 15.5 % Final  . Platelets 08/22/2015 226  150 - 400 K/uL Final  . MPV 08/22/2015 9.7  8.6 - 12.4 fL Final  . Neutrophils Relative % 08/22/2015 43  43 - 77 % Final  . Neutro Abs 08/22/2015 2.3  1.7 - 7.7 K/uL Final  . Lymphocytes Relative 08/22/2015 42  12 - 46 % Final  . Lymphs Abs 08/22/2015 2.2  0.7 - 4.0 K/uL Final  . Monocytes  Relative 08/22/2015 11  3 - 12 % Final  . Monocytes Absolute 08/22/2015 0.6  0.1 - 1.0 K/uL Final  . Eosinophils Relative 08/22/2015 3  0 - 5 % Final  . Eosinophils Absolute 08/22/2015 0.2  0.0 - 0.7 K/uL Final  . Basophils Relative 08/22/2015 1  0 - 1 % Final  . Basophils Absolute 08/22/2015 0.1  0.0 - 0.1 K/uL Final  . Smear Review 08/22/2015 Criteria for review not met   Final  . Sodium 08/22/2015 143  135 - 146 mmol/L Final  . Potassium 08/22/2015 4.7  3.5 - 5.3 mmol/L Final  . Chloride 08/22/2015 107  98 - 110 mmol/L Final  . CO2 08/22/2015 25  20 - 31 mmol/L Final  . Glucose, Bld 08/22/2015 163* 70 - 99 mg/dL Final  . BUN 08/22/2015 32* 7 - 25 mg/dL Final  . Creat 08/22/2015 1.35* 0.60 - 0.88 mg/dL Final  . Total Bilirubin 08/22/2015 0.3  0.2 - 1.2 mg/dL Final  . Alkaline Phosphatase 08/22/2015 103  33 - 130 U/L Final  . AST 08/22/2015 14  10 - 35 U/L Final  . ALT 08/22/2015 10  6 - 29 U/L Final  . Total Protein 08/22/2015 7.2  6.1 - 8.1 g/dL Final  . Albumin 08/22/2015 4.1  3.6 - 5.1 g/dL Final  .  Calcium 08/22/2015 9.6  8.6 - 10.4 mg/dL Final  . GFR, Est African American 08/22/2015 42* >=60 mL/min Final  . GFR, Est Non African American 08/22/2015 36* >=60 mL/min Final   Comment:   The estimated GFR is a calculation valid for adults (>=73 years old) that uses the CKD-EPI algorithm to adjust for age and sex. It is   not to be used for children, pregnant women, hospitalized patients,    patients on dialysis, or with rapidly changing kidney function. According to the NKDEP, eGFR >89 is normal, 60-89 shows mild impairment, 30-59 shows moderate impairment, 15-29 shows severe impairment and <15 is ESRD.     Marland Kitchen Hgb A1c MFr Bld 08/22/2015 8.7* <5.7 % Final   Comment:                                                                        According to the ADA Clinical Practice Recommendations for 2011, when HbA1c is used as a screening test:     >=6.5%   Diagnostic of Diabetes  Mellitus            (if abnormal result is confirmed)   5.7-6.4%   Increased risk of developing Diabetes Mellitus   References:Diagnosis and Classification of Diabetes Mellitus,Diabetes KYHC,6237,62(GBTDV 1):S62-S69 and Standards of Medical Care in         Diabetes - 2011,Diabetes VOHY,0737,10 (Suppl 1):S11-S61.     . Mean Plasma Glucose 08/22/2015 203* <117 mg/dL Final  . Vit D, 25-Hydroxy 08/22/2015 30  30 - 100 ng/mL Final   Comment: Vitamin D Status           25-OH Vitamin D        Deficiency                <20 ng/mL        Insufficiency         20 - 29 ng/mL        Optimal             > or = 30 ng/mL   For 25-OH Vitamin D testing on patients on D2-supplementation and patients for whom quantitation of D2 and D3 fractions is required, the QuestAssureD 25-OH VIT D, (D2,D3), LC/MS/MS is recommended: order code [phone] (patients > 2 yrs).   . Cholesterol 08/22/2015 208* 125 - 200 mg/dL Final  . Triglycerides 08/22/2015 372* <150 mg/dL Final  . HDL 08/22/2015 32* >=46 mg/dL Final  . Total CHOL/HDL Ratio 08/22/2015 6.5* <=5.0 Ratio Final  . VLDL 08/22/2015 74* <30 mg/dL Final  . LDL Cholesterol 08/22/2015 102  <130 mg/dL Final   Comment:   Total Cholesterol/HDL Ratio:CHD Risk                        Coronary Heart Disease Risk Table                                        Men       Women          1/2 Average Risk  3.4        3.3              Average Risk              5.0        4.4           2X Average Risk              9.6        7.1           3X Average Risk             23.4       11.0 Use the calculated Patient Ratio above and the CHD Risk table  to determine the patient's CHD Risk.     Past Medical History  Diagnosis Date  . Diabetes mellitus   . Hypertension   . Dyslipidemia   . Varicose veins   . Migraines   . GERD (gastroesophageal reflux disease)   . RSD (reflex sympathetic dystrophy) 2007    R wrist/hand following fx  . Anemia   . Osteoporosis     Past Surgical History  Procedure Laterality Date  . Umbilical hernia repair    . Breast cyst excision    . Tubal ligation    . Cholecystectomy  1989  . Appendectomy  1966  . Abdominal hysterectomy  1988  . Several benign cyst removed      last 1 in 1972  . Cataract extraction  10/2009   Current Outpatient Prescriptions on File Prior to Visit  Medication Sig Dispense Refill  . B-D ULTRAFINE III SHORT PEN 31G X 8 MM MISC USE AS DIRECTED 100 each 5  . Blood Glucose Monitoring Suppl (ONE TOUCH ULTRA SYSTEM KIT) W/DEVICE KIT 1 kit by Does not apply route once. Tests Blood sugar before meals and at bedtime 1 each 0  . Calcium Carbonate-Vitamin D (CALTRATE 600+D PO) Take 1 tablet by mouth 2 (two) times daily.    . clopidogrel (PLAVIX) 75 MG tablet Take 1 tablet (75 mg total) by mouth daily. 30 tablet 0  . ferrous sulfate 325 (65 FE) MG tablet TAKE 1 TABLET BY MOUTH THREE TIMES DAILY WITH MEALS 90 tablet 4  . furosemide (LASIX) 40 MG tablet Take 40 mg by mouth 2 (two) times daily.    Marland Kitchen gemfibrozil (LOPID) 600 MG tablet TAKE 1 TABLET BY MOUTH TWICE DAILY BEFORE A MEAL 60 tablet 5  . glucose blood test strip 1 each by Other route 4 (four) times daily -  before meals and at bedtime. Use as instructed 150 each 12  . insulin glargine (LANTUS) 100 UNIT/ML injection INJECT 50 UNITS UNDER THE SKIN EVERY DAY. 20 mL 3  . INSULIN SYRINGE 1CC/29G 29G X 1/2" 1 ML MISC   6  . INSULIN SYRINGE 1CC/29G 29G X 1/2" 1 ML MISC AS DIRECTED. 100 each 3  . losartan (COZAAR) 100 MG tablet TAKE 1 TABLET BY MOUTH EVERY DAY 30 tablet 5  . metoprolol (LOPRESSOR) 50 MG tablet TAKE 1/2 TABLET  BY MOUTH TWICE DAILY 180 tablet 0  . Multiple Vitamin (MULTIVITAMIN) tablet Take 1 tablet by mouth daily.      Marland Kitchen omega-3 acid ethyl esters (LOVAZA) 1 G capsule TAKE 2 CAPSULES BY MOUTH DAILY 60 capsule 3  . omeprazole (PRILOSEC) 20 MG capsule TAKE 1 CAPSULE BY MOUTH EVERY DAY 30 capsule 11  . potassium chloride SA (K-DUR,KLOR-CON)  20 MEQ tablet TAKE 1 TABLET  BY MOUTH TWICE DAILY 180 tablet 0  . Pyridoxine HCl (VITAMIN B-6) 100 MG tablet Take 100 mg by mouth daily.       No current facility-administered medications on file prior to visit.   Allergies  Allergen Reactions  . Lipitor [Atorvastatin Calcium] Diarrhea  . Niacin And Related Hives  . Statins Diarrhea  . Macrobid [Nitrofurantoin Macrocrystal] Other (See Comments)    unknown   Social History   Social History  . Marital Status: Married    Spouse Name: N/A  . Number of Children: 3  . Years of Education: N/A   Occupational History  . Retired    Social History Main Topics  . Smoking status: Never Smoker   . Smokeless tobacco: Never Used  . Alcohol Use: No  . Drug Use: No  . Sexual Activity: Not on file   Other Topics Concern  . 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 Stroke Mother 55  . Hypertension Mother   . Clotting disorder Father   . Heart attack Father   . Arrhythmia Sister   . Stroke Brother   . Diabetes Neg Hx   . Thyroid disease Neg Hx       Review of Systems  All other systems reviewed and are negative.      Objective:   Physical Exam  Constitutional: She is oriented to person, place, and time. She appears well-developed and well-nourished. No distress.  HENT:  Head: Normocephalic and atraumatic.  Right Ear: External ear normal.  Left Ear: External ear normal.  Nose: Nose normal.  Mouth/Throat: Oropharynx is clear and moist. No oropharyngeal exudate.  Eyes: Conjunctivae and EOM are normal. Pupils are equal, round, and reactive to light. Right eye exhibits no discharge. Left eye exhibits no discharge. No scleral icterus.  Neck: Normal range of motion. Neck supple. No JVD present. No tracheal deviation present. No thyromegaly present.   Cardiovascular: Normal rate, regular rhythm, normal heart sounds and intact distal pulses.  Exam reveals no gallop and no friction rub.   No murmur heard. Pulmonary/Chest: Effort normal and breath sounds normal. No stridor. No respiratory distress. She has no wheezes. She has no rales. She exhibits no tenderness.  Abdominal: Soft. Bowel sounds are normal. She exhibits no distension and no mass. There is no tenderness. There is no rebound and no guarding.  Musculoskeletal: Normal range of motion. She exhibits no edema or tenderness.  Lymphadenopathy:    She has no cervical adenopathy.  Neurological: She is alert and oriented to person, place, and time. She has normal reflexes. No cranial nerve deficit. She exhibits normal muscle tone. Coordination normal.  Skin: Skin is warm. No rash noted. She is not diaphoretic. No erythema. No pallor.  Psychiatric: She has a normal mood and affect. Her behavior is normal. Judgment and thought content normal.  Vitals reviewed.         Assessment & Plan:  Routine general medical examination at a health care facility  His physical exam is normal. Her immunizations are up-to-date. She does not require a Pap smear. . We agree that colonoscopy is not prudent for this patient.  Her diabetic eye exam is up-to-date and was performed in 9/16.   There is a precancerous lesion on her right earlobe and I have  recommended that she see her dermatologist for treatment.  At the present time her cholesterol and blood sugars remain poorly controlled. I strongly encouraged the patient to begin the Victoza her endocrinologist recommended. I also started her on pravastatin 10 mg by mouth daily and recheck a fasting lipid panel in 3 months

## 2015-09-01 ENCOUNTER — Encounter: Payer: Self-pay | Admitting: Neurology

## 2015-09-01 ENCOUNTER — Ambulatory Visit (INDEPENDENT_AMBULATORY_CARE_PROVIDER_SITE_OTHER): Payer: Medicare Other | Admitting: Neurology

## 2015-09-01 VITALS — BP 123/53 | HR 56 | Ht 64.0 in | Wt 146.6 lb

## 2015-09-01 DIAGNOSIS — R4701 Aphasia: Secondary | ICD-10-CM | POA: Diagnosis not present

## 2015-09-01 NOTE — Progress Notes (Signed)
[facility] [address] [place]. [place] [address] [phone]       OFFICE FOLLOW-UP NOTE  Rebecca Perkins Date of Birth:  May 10, 1929 Medical Record Number:  [id number]   HPI: 47 year Caucasian lady seen today for first office follow-up visit following hospital admission for TIA in November 2016.Rebecca Perkins is an 82 y.o. female with a 15 min episode of unable to speak the correct words out, winessed by her family. No other focal neuro symptoms at that time. Sx resolved in 10-15 min.Patient was not administered TPA secondary to resolved deficits. She was admitted for further evaluation and treatment. CT scan of the head on admission showed only chronic microvascular disease and MRI scan showed no acute infarct. Old lacunar infarcts noted in the left basal ganglia and thalamus. MRA of the brain showed no large vessel occlusion or stenosis. Carotid ultrasound showed no significant extracranial stenosis. Transthoracic echo showed ejection fraction of 50-55% without cardiac source of embolism. LDL cholesterol was 81 mg percent. Last hemoglobin A1c on 08/22/15 was 8.7. While lipid profile showed total cholesterol 208, triglycerides 372 HDL 32 and LDL 102 mg percent on 08/22/15. She was started on Plavix which is tolerating well with only minor bruising. She however has not yet started protocol during she has filled the prescription and plans to do so. She does have a prior history of statin myalgias.  ROS:   14 system review of systems is positive for easy bruising, increased thirst only and all other systems negative PMH:  Past Medical History  Diagnosis Date  . Diabetes mellitus   . Hypertension   . Dyslipidemia   . Varicose veins   . Migraines   . GERD (gastroesophageal reflux disease)   . RSD (reflex sympathetic dystrophy) 2007    R wrist/hand following fx  . Anemia   . Osteoporosis   . Stroke [name])     Social History:  Social History   Social History    . Marital Status: Married    Spouse Name: N/A  . Number of Children: 3  . Years of Education: N/A   Occupational History  . Retired    Social History Main Topics  . Smoking status: Never Smoker   . Smokeless tobacco: Never Used  . Alcohol Use: No  . Drug Use: No  . Sexual Activity: Not on file   Other Topics Concern  . 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 B-D ULTRAFINE III SHORT PEN 31G X 8 MM MISC USE AS DIRECTED 100 each 5  . Blood Glucose Monitoring Suppl (ONE TOUCH ULTRA SYSTEM KIT) W/DEVICE KIT 1 kit by Does not apply route once. Tests Blood sugar before meals and at bedtime 1 each 0  . Calcium Carbonate-Vitamin D (CALTRATE 600+D PO) Take 1 tablet by mouth 2 (two) times daily.    . clopidogrel (PLAVIX) 75 MG tablet Take 1 tablet (75 mg total) by mouth daily. 30 tablet 0  . ferrous sulfate 325 (65 FE) MG tablet TAKE 1 TABLET BY MOUTH THREE TIMES DAILY WITH MEALS 90 tablet 4  . furosemide (LASIX) 40 MG tablet Take 40 mg by mouth daily.     Marland Kitchen gemfibrozil (LOPID) 600 MG tablet TAKE  1 TABLET BY MOUTH TWICE DAILY BEFORE A MEAL 60 tablet 5  . glucose blood test strip 1 each by Other route 4 (four) times daily -  before meals and at bedtime. Use as instructed 150 each 12  . insulin glargine (LANTUS) 100 UNIT/ML injection INJECT 50 UNITS UNDER THE SKIN EVERY DAY. 20 mL 3  . INSULIN SYRINGE 1CC/29G 29G X 1/2" 1 ML MISC   6  . INSULIN SYRINGE 1CC/29G 29G X 1/2" 1 ML MISC AS DIRECTED. 100 each 3  . losartan (COZAAR) 100 MG tablet TAKE 1 TABLET BY MOUTH EVERY DAY 30 tablet 5  . metoprolol (LOPRESSOR) 50 MG tablet TAKE 1/2 TABLET  BY MOUTH TWICE DAILY 180 tablet 0  . Multiple Vitamin (MULTIVITAMIN) tablet Take 1 tablet by mouth daily.      Marland Kitchen  omega-3 acid ethyl esters (LOVAZA) 1 G capsule TAKE 2 CAPSULES BY MOUTH DAILY 60 capsule 3  . omeprazole (PRILOSEC) 20 MG capsule TAKE 1 CAPSULE BY MOUTH EVERY DAY 30 capsule 11  . potassium chloride SA (K-DUR,KLOR-CON) 20 MEQ tablet TAKE 1 TABLET BY MOUTH TWICE DAILY 180 tablet 0  . pravastatin (PRAVACHOL) 20 MG tablet Take 0.5 tablets (10 mg total) by mouth daily. 30 tablet 5  . Pyridoxine HCl (VITAMIN B-6) 100 MG tablet Take 100 mg by mouth daily.       No current facility-administered medications on file prior to visit.    Allergies:   Allergies  Allergen Reactions  . Lipitor [Atorvastatin Calcium] Diarrhea  . Niacin And Related Hives  . Statins Diarrhea  . Macrobid [Nitrofurantoin Macrocrystal] Other (See Comments)    unknown    Physical Exam General: Frail elderly Caucasian lady, seated, in no evident distress Head: head normocephalic and atraumatic.  Neck: supple with no carotid or supraclavicular bruits Cardiovascular: regular rate and rhythm, no murmurs Musculoskeletal: no deformity Skin:  no rash/petichiae Vascular:  Normal pulses all extremities Filed Vitals:   09/01/15 1532  BP: 123/53  Pulse: 56   Neurologic Exam Mental Status: Awake and fully alert. Oriented to place and time. Recent and remote memory intact. Attention span, concentration and fund of knowledge appropriate. Mood and affect appropriate.  Cranial Nerves: Fundoscopic exam reveals sharp disc margins. Pupils equal, briskly reactive to light. Extraocular movements full without nystagmus. Visual fields full to confrontation. Hearing intact. Facial sensation intact. Face, tongue, palate moves normally and symmetrically.  Motor: Normal bulk and tone. Normal strength in all tested extremity muscles. Sensory.: intact to touch ,pinprick .position and vibratory sensation.  Coordination: Rapid alternating movements normal in all extremities. Finger-to-nose and heel-to-shin performed accurately bilaterally. Gait  and Station: Arises from chair without difficulty. Stance is normal. Gait demonstrates normal stride length and balance . Unable to heel, toe and tandem walk without difficulty.  Reflexes: 1+ and symmetric. Toes downgoing.   NIHSS 0 Modified Rankin  0   ASSESSMENT: 53 year lady with left hemispheric TIA in November 2016 likely from small vessel disease with vascular risk factors of hypertension, hyperlipidemia, diabetes and age.    PLAN: I had a long d/w patient and son about her recent TIA, risk for recurrent stroke/TIAs, personally independently reviewed imaging studies and stroke evaluation results and answered questions.Continue Plavix  for secondary stroke prevention and maintain strict control of hypertension with blood pressure goal below 130/90, diabetes with hemoglobin A1c goal below 6.5% and lipids with LDL cholesterol goal below 70 mg/dL. I advised the patient to start taking coenzyme every 10 to help  with statin related myalgias I also advised the patient to eat a healthy diet with plenty of whole grains, cereals, fruits and vegetables, exercise regularly and maintain ideal body weightGreater than 50% of time during this 25 minute visit was spent on counseling,explanation of diagnosis, planning of further management, discussion with patient and family and coordination of care  .Followup in the future with nurse practitioner in 6 months or call earlier if necessary Antony Contras, MD  Note: This document was prepared with digital dictation and possible smart phrase technology. Any transcriptional errors that result from this process are unintentional

## 2015-09-01 NOTE — Patient Instructions (Addendum)
I had a long d/w patient and son about her recent TIA, risk for recurrent stroke/TIAs, personally independently reviewed imaging studies and stroke evaluation results and answered questions.Continue Plavix  for secondary stroke prevention and maintain strict control of hypertension with blood pressure goal below 130/90, diabetes with hemoglobin A1c goal below 6.5% and lipids with LDL cholesterol goal below 70 mg/dL. I advised the patient to take coenzyme Q 10 200 mg daily to help with statin related myalgias. I also advised the patient to eat a healthy diet with plenty of whole grains, cereals, fruits and vegetables, exercise regularly and maintain ideal body weight Followup in the future with nurse practitioner in 6 months or call earlier if necessary Stroke Prevention Some medical conditions and behaviors are associated with an increased chance of having a stroke. You may prevent a stroke by making healthy choices and managing medical conditions. HOW CAN I REDUCE MY RISK OF HAVING A STROKE?   Stay physically active. Get at least 30 minutes of activity on most or all days.  Do not smoke. It may also be helpful to avoid exposure to secondhand smoke.  Limit alcohol use. Moderate alcohol use is considered to be:  No more than 2 drinks per day for men.  No more than 1 drink per day for nonpregnant women.  Eat healthy foods. This involves:  Eating 5 or more servings of fruits and vegetables a day.  Making dietary changes that address high blood pressure (hypertension), high cholesterol, diabetes, or obesity.  Manage your cholesterol levels.  Making food choices that are high in fiber and low in saturated fat, trans fat, and cholesterol may control cholesterol levels.  Take any prescribed medicines to control cholesterol as directed by your health care provider.  Manage your diabetes.  Controlling your carbohydrate and sugar intake is recommended to manage diabetes.  Take any prescribed  medicines to control diabetes as directed by your health care provider.  Control your hypertension.  Making food choices that are low in salt (sodium), saturated fat, trans fat, and cholesterol is recommended to manage hypertension.  Ask your health care provider if you need treatment to lower your blood pressure. Take any prescribed medicines to control hypertension as directed by your health care provider.  If you are 53-55 years of age, have your blood pressure checked every 3-5 years. If you are 3 years of age or older, have your blood pressure checked every year.  Maintain a healthy weight.  Reducing calorie intake and making food choices that are low in sodium, saturated fat, trans fat, and cholesterol are recommended to manage weight.  Stop drug abuse.  Avoid taking birth control pills.  Talk to your health care provider about the risks of taking birth control pills if you are over 78 years old, smoke, get migraines, or have ever had a blood clot.  Get evaluated for sleep disorders (sleep apnea).  Talk to your health care provider about getting a sleep evaluation if you snore a lot or have excessive sleepiness.  Take medicines only as directed by your health care provider.  For some people, aspirin or blood thinners (anticoagulants) are helpful in reducing the risk of forming abnormal blood clots that can lead to stroke. If you have the irregular heart rhythm of atrial fibrillation, you should be on a blood thinner unless there is a good reason you cannot take them.  Understand all your medicine instructions.  Make sure that other conditions (such as anemia or atherosclerosis) are addressed.  SEEK IMMEDIATE MEDICAL CARE IF:   You have sudden weakness or numbness of the face, arm, or leg, especially on one side of the body.  Your face or eyelid droops to one side.  You have sudden confusion.  You have trouble speaking (aphasia) or understanding.  You have sudden trouble  seeing in one or both eyes.  You have sudden trouble walking.  You have dizziness.  You have a loss of balance or coordination.  You have a sudden, severe headache with no known cause.  You have new chest pain or an irregular heartbeat. Any of these symptoms may represent a serious problem that is an emergency. Do not wait to see if the symptoms will go away. Get medical help at once. Call your local emergency services (911 in [place] Do not drive yourself to the hospital.   This information is not intended to replace advice given to you by your health care provider. Make sure you discuss any questions you have with your health care provider.   Document Released: 08/26/2004 Document Revised: 08/09/2014 Document Reviewed: 01/19/2013 Elsevier Interactive Patient Education [employer].

## 2015-09-15 ENCOUNTER — Other Ambulatory Visit: Payer: Self-pay | Admitting: Family Medicine

## 2015-10-02 ENCOUNTER — Telehealth: Payer: Self-pay | Admitting: *Deleted

## 2015-10-02 ENCOUNTER — Ambulatory Visit: Payer: Medicare Other | Admitting: Physician Assistant

## 2015-10-02 ENCOUNTER — Emergency Department (HOSPITAL_BASED_OUTPATIENT_CLINIC_OR_DEPARTMENT_OTHER)
Admission: EM | Admit: 2015-10-02 | Discharge: 2015-10-02 | Disposition: A | Discharge status: Home / Self Care | Payer: Medicare Other | Attending: Emergency Medicine | Admitting: Emergency Medicine

## 2015-10-02 ENCOUNTER — Emergency Department (HOSPITAL_BASED_OUTPATIENT_CLINIC_OR_DEPARTMENT_OTHER): Payer: Medicare Other

## 2015-10-02 DIAGNOSIS — L03115 Cellulitis of right lower limb: Secondary | ICD-10-CM | POA: Diagnosis not present

## 2015-10-02 DIAGNOSIS — Z8673 Personal history of transient ischemic attack (TIA), and cerebral infarction without residual deficits: Secondary | ICD-10-CM | POA: Diagnosis not present

## 2015-10-02 DIAGNOSIS — G43909 Migraine, unspecified, not intractable, without status migrainosus: Secondary | ICD-10-CM | POA: Diagnosis not present

## 2015-10-02 DIAGNOSIS — Z794 Long term (current) use of insulin: Secondary | ICD-10-CM | POA: Insufficient documentation

## 2015-10-02 DIAGNOSIS — D649 Anemia, unspecified: Secondary | ICD-10-CM | POA: Insufficient documentation

## 2015-10-02 DIAGNOSIS — K219 Gastro-esophageal reflux disease without esophagitis: Secondary | ICD-10-CM | POA: Diagnosis not present

## 2015-10-02 DIAGNOSIS — R52 Pain, unspecified: Secondary | ICD-10-CM

## 2015-10-02 DIAGNOSIS — Z7902 Long term (current) use of antithrombotics/antiplatelets: Secondary | ICD-10-CM | POA: Insufficient documentation

## 2015-10-02 DIAGNOSIS — M810 Age-related osteoporosis without current pathological fracture: Secondary | ICD-10-CM | POA: Diagnosis not present

## 2015-10-02 DIAGNOSIS — E1140 Type 2 diabetes mellitus with diabetic neuropathy, unspecified: Secondary | ICD-10-CM | POA: Diagnosis not present

## 2015-10-02 DIAGNOSIS — L03031 Cellulitis of right toe: Secondary | ICD-10-CM

## 2015-10-02 DIAGNOSIS — I10 Essential (primary) hypertension: Secondary | ICD-10-CM | POA: Insufficient documentation

## 2015-10-02 DIAGNOSIS — Z79899 Other long term (current) drug therapy: Secondary | ICD-10-CM | POA: Insufficient documentation

## 2015-10-02 DIAGNOSIS — R2241 Localized swelling, mass and lump, right lower limb: Secondary | ICD-10-CM | POA: Diagnosis present

## 2015-10-02 LAB — BASIC METABOLIC PANEL
Anion gap: 8 (ref 5–15)
BUN: 20 mg/dL (ref 6–20)
CO2: 25 mmol/L (ref 22–32)
Calcium: 9.1 mg/dL (ref 8.9–10.3)
Chloride: 106 mmol/L (ref 101–111)
Creatinine, Ser: 0.93 mg/dL (ref 0.44–1.00)
GFR calc Af Amer: >60 mL/min (ref >60)
GFR calc non Af Amer: 55 mL/min — ABNORMAL LOW (ref >60)
Glucose, Bld: 272 mg/dL — ABNORMAL HIGH (ref 65–99)
Potassium: 4.4 mmol/L (ref 3.5–5.1)
Sodium: 139 mmol/L (ref 135–145)

## 2015-10-02 LAB — CBC WITH DIFFERENTIAL/PLATELET
Basophils Absolute: 0 10*3/uL (ref 0.0–0.1)
Basophils Relative: 0 %
Eosinophils Absolute: 0.1 10*3/uL (ref 0.0–0.7)
Eosinophils Relative: 1 %
HCT: 36.1 % (ref 36.0–46.0)
Hemoglobin: 11.9 g/dL — ABNORMAL LOW (ref 12.0–15.0)
Lymphocytes Relative: 24 %
Lymphs Abs: 2 10*3/uL (ref 0.7–4.0)
MCH: 28.1 pg (ref 26.0–34.0)
MCHC: 33 g/dL (ref 30.0–36.0)
MCV: 85.1 fL (ref 78.0–100.0)
Monocytes Absolute: 0.6 10*3/uL (ref 0.1–1.0)
Monocytes Relative: 7 %
Neutro Abs: 5.7 10*3/uL (ref 1.7–7.7)
Neutrophils Relative %: 68 %
Platelets: 178 10*3/uL (ref 150–400)
RBC: 4.24 MIL/uL (ref 3.87–5.11)
RDW: 13 % (ref 11.5–15.5)
WBC: 8.5 10*3/uL (ref 4.0–10.5)

## 2015-10-02 MED ORDER — DOXYCYCLINE HYCLATE 100 MG PO CAPS: 100.0000 mg | ORAL_CAPSULE | Freq: Two times a day (BID) | ORAL | Status: DC

## 2015-10-02 MED FILL — DOXYCYCLINE HYC 100 MG CAP: 100 | 10 days supply | Qty: 20 | Fill #0

## 2015-10-02 NOTE — Discharge Instructions (Signed)
1. Continue home medications. 2. Start taking Doxycycline twice daily.  Soak your foot in warm water for 20 minutes two times a day.  You may take tylenol or ibuprofen for pain. 3.  Go to your scheduled appointment at [facility].  Return to ED if you experience increased swelling, or spreading redness up your toe, fever, chills, nausea, or vomiting.  Cellulitis Cellulitis is an infection of the skin and the tissue beneath it. The infected area is usually red and tender. Cellulitis occurs most often in the arms and lower legs.  CAUSES  Cellulitis is caused by bacteria that enter the skin through cracks or cuts in the skin. The most common types of bacteria that cause cellulitis are staphylococci and streptococci. SIGNS AND SYMPTOMS   Redness and warmth.  Swelling.  Tenderness or pain.  Fever. DIAGNOSIS  Your health care provider can usually determine what is wrong based on a physical exam. Blood tests may also be done. TREATMENT  Treatment usually involves taking an antibiotic medicine. HOME CARE INSTRUCTIONS   Take your antibiotic medicine as directed by your health care provider. Finish the antibiotic even if you start to feel better.  Keep the infected arm or leg elevated to reduce swelling.  Apply a warm cloth to the affected area up to 4 times per day to relieve pain.  Take medicines only as directed by your health care provider.  Keep all follow-up visits as directed by your health care provider. SEEK MEDICAL CARE IF:   You notice red streaks coming from the infected area.  Your red area gets larger or turns dark in color.  Your bone or joint underneath the infected area becomes painful after the skin has healed.  Your infection returns in the same area or another area.  You notice a swollen bump in the infected area.  You develop new symptoms.  You have a fever. SEEK IMMEDIATE MEDICAL CARE IF:   You feel very sleepy.  You develop vomiting or  diarrhea.  You have a general ill feeling (malaise) with muscle aches and pains.   This information is not intended to replace advice given to you by your health care provider. Make sure you discuss any questions you have with your health care provider.   Document Released: 04/28/2005 Document Revised: 04/09/2015 Document Reviewed: 10/04/2011 Elsevier Interactive Patient Education [employer].

## 2015-10-02 NOTE — ED Provider Notes (Signed)
CSN: [id number]     Arrival date & time 10/02/15  1340 History   First MD Initiated Contact with Patient 10/02/15 1500     Chief Complaint  Patient presents with  . Wound Infection     (Consider location/radiation/quality/duration/timing/severity/associated sxs/prior Treatment) HPI   Rebecca Perkins is a 82 y.o. female with PMH significant for DM, HTN, dyslipidemia, GERD, stroke who presents with gradual onset, constant, worsening, moderate right 2nd toe redness, swelling, and drainage x 2 days.  Patient denies pain, she reports hx of diabetic neuropathy.  No aggravating or relieving factors.  Denies fever, chills, N/V, abdominal pain.  Denies injury/trauma. She has an appointment next week with [place].   Past Medical History  Diagnosis Date  . Diabetes mellitus   . Hypertension   . Dyslipidemia   . Varicose veins   . Migraines   . GERD (gastroesophageal reflux disease)   . RSD (reflex sympathetic dystrophy) 2007    R wrist/hand following fx  . Anemia   . Osteoporosis   . Stroke [facility])    Past Surgical History  Procedure Laterality Date  . Umbilical hernia repair    . Breast cyst excision    . Tubal ligation    . Cholecystectomy  1989  . Appendectomy  1966  . Abdominal hysterectomy  1988  . Several benign cyst removed      last 1 in 1972  . Cataract extraction  10/2009   Family History  Problem Relation Age of Onset  . Stroke Mother 12  . Hypertension Mother   . Clotting disorder Father   . Heart attack Father   . Arrhythmia Sister   . Stroke Brother   . Diabetes Neg Hx   . Thyroid disease Neg Hx    Social History  Substance Use Topics  . Smoking status: Never Smoker   . Smokeless tobacco: Never Used  . Alcohol Use: No   OB History    No data available     Review of Systems All other systems negative unless otherwise stated in HPI    Allergies  Lipitor; Niacin and related; Statins; and Macrobid  Home Medications   Prior to Admission  medications   Medication Sig Start Date End Date Taking? Authorizing Provider  B-D ULTRAFINE III SHORT PEN 31G X 8 MM MISC USE AS DIRECTED 01/06/15   [name], MD  Blood Glucose Monitoring Suppl (ONE TOUCH ULTRA SYSTEM KIT) W/DEVICE KIT 1 kit by Does not apply route once. Tests Blood sugar before meals and at bedtime 07/10/14   [name], MD  Calcium Carbonate-Vitamin D (CALTRATE 600+D PO) Take 1 tablet by mouth 2 (two) times daily.    Historical Provider, MD  clopidogrel (PLAVIX) 75 MG tablet TAKE 1 TABLET BY MOUTH DAILY 09/15/15   [name], MD  doxycycline (VIBRAMYCIN) 100 MG capsule Take 1 capsule (100 mg total) by mouth 2 (two) times daily. 10/02/15   Gloriann Loan, PA-C  ferrous sulfate 325 (65 FE) MG tablet TAKE 1 TABLET BY MOUTH THREE TIMES DAILY WITH MEALS 07/09/15   [name], MD  furosemide (LASIX) 40 MG tablet Take 40 mg by mouth daily.     Historical Provider, MD  gemfibrozil (LOPID) 600 MG tablet TAKE 1 TABLET BY MOUTH TWICE DAILY BEFORE A MEAL 01/13/15   [name], MD  glucose blood test strip 1 each by Other route 4 (four) times daily -  before meals and at bedtime. Use  as instructed 07/10/14   [name], MD  insulin glargine (LANTUS) 100 UNIT/ML injection INJECT 50 UNITS UNDER THE SKIN EVERY DAY. 06/19/15   Geradine Girt, DO  INSULIN SYRINGE 1CC/29G 29G X 1/2" 1 ML MISC  09/18/14   Historical Provider, MD  INSULIN SYRINGE 1CC/29G 29G X 1/2" 1 ML MISC AS DIRECTED. 07/09/15   Elayne Snare, MD  losartan (COZAAR) 100 MG tablet TAKE 1 TABLET BY MOUTH EVERY DAY 09/15/15   [name], MD  metoprolol (LOPRESSOR) 50 MG tablet TAKE 1/2 TABLET  BY MOUTH TWICE DAILY 06/19/15   Geradine Girt, DO  Multiple Vitamin (MULTIVITAMIN) tablet Take 1 tablet by mouth daily.      Historical Provider, MD  omega-3 acid ethyl esters (LOVAZA) 1 G capsule TAKE 2 CAPSULES BY MOUTH DAILY 12/09/14   [name], MD  omeprazole (PRILOSEC) 20 MG capsule TAKE 1 CAPSULE BY MOUTH  EVERY DAY 10/17/14   [name], MD  potassium chloride SA (K-DUR,KLOR-CON) 20 MEQ tablet TAKE 1 TABLET BY MOUTH TWICE DAILY 12/05/14   Alycia Rossetti, MD  pravastatin (PRAVACHOL) 20 MG tablet Take 0.5 tablets (10 mg total) by mouth daily. 08/26/15   [name], MD  Pyridoxine HCl (VITAMIN B-6) 100 MG tablet Take 100 mg by mouth daily.      Historical Provider, MD   BP 159/68 mmHg  Pulse 74  Temp(Src) 97.9 F (36.6 C) (Oral)  Resp 18  Ht 5' 4.5" (1.638 m)  Wt 68.04 kg  BMI 25.36 kg/m2  SpO2 99% Physical Exam  Constitutional: She is oriented to person, place, and time. She appears well-developed and well-nourished.  HENT:  Head: Normocephalic and atraumatic.  Right Ear: External ear normal.  Left Ear: External ear normal.  Eyes: Conjunctivae are normal. No scleral icterus.  Neck: No tracheal deviation present.  Cardiovascular:  Pulses:      Dorsalis pedis pulses are 2+ on the right side, and 2+ on the left side.  Right foot with good perfusion.   Pulmonary/Chest: Effort normal. No respiratory distress.  Abdominal: She exhibits no distension.  Musculoskeletal: Normal range of motion.  Neurological: She is alert and oriented to person, place, and time.  Decreased sensation in distal toes (chronic).  Strength intact.  Able to move all toes without difficulty.   Skin: Skin is warm and dry.  Right second toe erythematous, swollen, and with two areas of fluctuance.  No induration.  Psychiatric: She has a normal mood and affect. Her behavior is normal.    ED Course  Procedures (including critical care time) Labs Review Labs Reviewed  CBC WITH DIFFERENTIAL/PLATELET - Abnormal; Notable for the following:    Hemoglobin 11.9 (*)    All other components within normal limits  BASIC METABOLIC PANEL - Abnormal; Notable for the following:    Glucose, Bld 272 (*)    GFR calc non Af Amer 55 (*)    All other components within normal limits    Imaging Review Dg Foot Complete  Right  10/02/2015  CLINICAL DATA:  Second toe ulcer with redness and swelling, no known injury, initial encounter EXAM: RIGHT FOOT COMPLETE - 3+ VIEW COMPARISON:  None. FINDINGS: There is an oblique fracture through the distal aspect of the fifth metatarsal with mild medial displacement of the distal fracture fragment. No significant callus formation is noted in this is likely of an acute/ subacute nature. Degenerative changes at the first MTP joint are seen. Soft tissue changes  are noted within the second toe although no definitive bony erosion is noted. Tarsal degenerative changes are noted as well. IMPRESSION: Soft tissue changes in the second toe without bony erosion. Mildly displaced fifth metatarsal fracture distally as described. Electronically Signed   By: Inez Catalina M.D.   On: 10/02/2015 15:51   I have personally reviewed and evaluated these images and lab results as part of my medical decision-making.   EKG Interpretation None      MDM   Final diagnoses:  Cellulitis of toe of right foot    Right second toe erythema, swelling, and drainage.  VSS, NAD.  No systemic symptoms.  Hx of DM.  Will obtain plain films to evaluate for osteomyelitis and basic labs.  Patient will be seen by Dr. Rogene Houston.    Plain films remarkable for mildly displaced 5th metatarsal fracture, occurred in January secondary to fall.  Soft tissue changes in second toe without bony erosion. Labs without acute abnormalities.   Recommend 20 min soaks in warm water BID.  Plan to discharge home with Doxycycline.  Follow up with [facility].  Discussed return precautions. Patient agrees and acknowledges the above plan for discharge.   Gloriann Loan, PA-C 10/02/15 [place], MD 10/02/15 [phone]

## 2015-10-02 NOTE — ED Notes (Signed)
Pt. Reports the R second toe started to turn red and swollen on Tues and she made a Dr. appt but the toe became red and started to drain today so she felt it need attention today.  Pt. Has diabetes type 2 6-7 yrs.  Pt. Feels no pain in toe.  Toe is red and edematous and has noted white area noted to end of toe.  Nail bed is dry and thick.

## 2015-10-02 NOTE — ED Notes (Signed)
PA at bedside.

## 2015-10-02 NOTE — Telephone Encounter (Signed)
Pt's son called states pt has a serious infection in one of her toes, has an appt Wednesday of next week but would like to get in sooner.  Left message instructing pt to begin 1/2C epsom salt in 1 qt of warm water for 20 minutes, then pat dry and cover with a Neosporin bandaid.  I encouraged the family to call today for an appt tomorrow either in [place] or [place].

## 2015-10-08 ENCOUNTER — Ambulatory Visit (INDEPENDENT_AMBULATORY_CARE_PROVIDER_SITE_OTHER): Payer: Medicare Other | Admitting: Podiatry

## 2015-10-08 ENCOUNTER — Ambulatory Visit (INDEPENDENT_AMBULATORY_CARE_PROVIDER_SITE_OTHER): Payer: Medicare Other

## 2015-10-08 ENCOUNTER — Encounter: Payer: Self-pay | Admitting: Podiatry

## 2015-10-08 VITALS — BP 148/71 | HR 64 | Resp 16 | Ht 64.5 in | Wt 148.0 lb

## 2015-10-08 DIAGNOSIS — E1140 Type 2 diabetes mellitus with diabetic neuropathy, unspecified: Secondary | ICD-10-CM

## 2015-10-08 DIAGNOSIS — M2041 Other hammer toe(s) (acquired), right foot: Secondary | ICD-10-CM | POA: Diagnosis not present

## 2015-10-08 DIAGNOSIS — E11621 Type 2 diabetes mellitus with foot ulcer: Secondary | ICD-10-CM | POA: Diagnosis not present

## 2015-10-08 DIAGNOSIS — L97509 Non-pressure chronic ulcer of other part of unspecified foot with unspecified severity: Secondary | ICD-10-CM | POA: Diagnosis not present

## 2015-10-08 DIAGNOSIS — E1149 Type 2 diabetes mellitus with other diabetic neurological complication: Secondary | ICD-10-CM

## 2015-10-08 DIAGNOSIS — Q828 Other specified congenital malformations of skin: Secondary | ICD-10-CM | POA: Diagnosis not present

## 2015-10-08 DIAGNOSIS — M79671 Pain in right foot: Secondary | ICD-10-CM

## 2015-10-08 NOTE — Progress Notes (Signed)
   Subjective:    Patient ID: Rebecca Perkins, female    DOB: 03-26-1929, 82 y.o.   MRN: [id number]  HPI Patient presents with foot pain in their right foot; 2nd toe. Pt stated, "Was told had cellulitis in toe at [facility], last Thursday"; need to have toe checked.  Patient also presents with a callous in their right foot; 2nd toe.  Pt diabetic type 2; sugar=199 this am; A1C=8.1.   Review of Systems  All other systems reviewed and are negative.      Objective:   Physical Exam        Assessment & Plan:

## 2015-10-09 NOTE — Progress Notes (Signed)
Subjective:     Patient ID: Rebecca Perkins, female   DOB: 1929-02-12, 82 y.o.   MRN: [id number]  HPI patient presents with son after having what appears to be a draining ulceration distal aspect second digit right which has resolved but has keratotic tissue formation and the patient is a long-term diabetic under reasonable control. She is currently on antibiotic treatment which is taken out all redness   Review of Systems  All other systems reviewed and are negative.      Objective:   Physical Exam  Constitutional: She is oriented to person, place, and time.  Cardiovascular: Intact distal pulses.   Musculoskeletal: Normal range of motion.  Neurological: She is oriented to person, place, and time.  Skin: Skin is warm.  Nursing note and vitals reviewed.  neurovascular status was found to be mildly diminished both sharp Dole vibratory and pulses were weak but present bilateral. The distal aspect second digit right has keratotic tissue formation with minimal erythema noted and no active drainage at the current time. The toe is slightly long and appears to have additional pressure on it secondary to it structure is noted to have Fill time 3 seconds and is well oriented and again has caregiver with her     Assessment:     Digital deformity second digit right with at risk diabetic condition causing probable distal ulceration which has resolved currently with cellulitis that has resolved    Plan:     H&P and condition and x-rays reviewed with patient and caregiver. At this point I debrided the distal tissue and applied buttress pad to lift the toe and instructed on soaks. If any drainage redness swelling or pain were to occur patient is to contact us immediately and I do think she makes an excellent candidate for diabetic shoes to prevent this in the future and we will get permission for these types of devices which will be of benefit.  Report indicates that there appears to be no lysis  occurring but it is difficult to make complete determination of the distal phalanx. It will have to be followed clinically

## 2015-10-19 ENCOUNTER — Other Ambulatory Visit: Payer: Self-pay | Admitting: Family Medicine

## 2015-10-28 ENCOUNTER — Encounter: Payer: Self-pay | Admitting: Endocrinology

## 2015-10-28 ENCOUNTER — Ambulatory Visit (INDEPENDENT_AMBULATORY_CARE_PROVIDER_SITE_OTHER): Payer: Medicare Other | Admitting: Endocrinology

## 2015-10-28 ENCOUNTER — Telehealth: Payer: Self-pay | Admitting: Nutrition

## 2015-10-28 VITALS — BP 126/62 | HR 87 | Temp 97.9°F | Resp 14 | Ht 64.5 in | Wt 149.8 lb

## 2015-10-28 DIAGNOSIS — Z794 Long term (current) use of insulin: Secondary | ICD-10-CM

## 2015-10-28 DIAGNOSIS — E1165 Type 2 diabetes mellitus with hyperglycemia: Secondary | ICD-10-CM | POA: Diagnosis not present

## 2015-10-28 MED ORDER — INSULIN LISPRO 100 UNIT/ML (KWIKPEN): PEN_INJECTOR | SUBCUTANEOUS | Status: DC

## 2015-10-28 NOTE — Patient Instructions (Signed)
Novolog 6 units at supper before meal and keep 2 hour sugar <180  Lantus 46 units in am   Check blood sugars on waking up 3-4  times a week Also check blood sugars about 2-3 hours after a meal and do this after different meals by rotation  Recommended blood sugar levels on waking up is 90-130 and about 2 hours after meal is 130-180  Please bring your blood sugar monitor to each visit, thank you

## 2015-10-28 NOTE — Progress Notes (Signed)
Patient ID: Rebecca Perkins, female   DOB: 02-Oct-1928, 82 y.o.   MRN: [id number]            Reason for Appointment: Follow-up for Type 2 Diabetes  Referring physician: Pickard  History of Present Illness:          Diagnosis: Type 2 diabetes mellitus, date of diagnosis: 2006       Past history:  At the time of diagnosis she was feeling tired and weak but does not know what her initial blood sugar was. She probably was tried on metformin initially but because of GI side effects discussed not be continued Also did not tolerate Actos because of nausea. She thinks that she was started on insulin within a few months of her initial diagnosis  Not clear what insulin she was trying in the beginning but she thinks it was NovoLog; however for the last few years has been on Lantus only Her blood sugars have been persistently poorly controlled with A1c usually 8-9% about 2011 At initial consultation she was taking 60 units of Lantus insulin without any mealtime coverage Because of her tendency to low blood sugars including overnight she was told to reduce the dose to 50 units  Recent history:   INSULIN regimen is described as: 50 Lantus in am with syringe   She has not been seen in follow-up since 11/16 Although she has been repeatedly told to take Novolog consistently with her meals especially supper she has not been doing this again Also on the last visit she was suggested trying Victoza as a simpler way of controlling postprandial readings but she did not do so because of fear of nausea  A1c is higher this year at 8.7  Current management, blood sugars and problems identified:  She has again been checking blood sugars only in the morning despite reminders to check readings after meals  She has only one reading after supper which was 224 and the next morning glucose was 114  Blood sugars are markedly variable in the morning and she does not know why  She thinks she is eating small meals  at breakfast and lunch and usually no carbohydrate in the morning  Her weight remains the same  Has not been doing much walking or exercise partly because of her foot ulcer, also gets fatigued  She still has not stopped regular soft drinks even though she has cut back a little       Oral hypoglycemic drugs the patient is taking are:   none     Side effects from medications have been: Metformin, Actos: nausea, diarrhea    Compliance with the medical regimen: Fair  Hypoglycemia: None recently  Glucose monitoring:  done 1 times a day         Glucometer: One Touch.      Blood Glucose readings    Mean values apply above for all meters except median for One Touch  PRE-MEAL Fasting Lunch Dinner Bedtime Overall  Glucose range: 78-249    224    Mean/median: 161     161+/-45      Self-care: The diet that the patient has been following is: tries to limit portions .     Meals: 2-3 meals per day. Breakfast is variable, bacon, fruit egg    Usually has an egg.  May sometimes have seafood, baked potato and hush puppies with dinner otherwise has relatively low amount of starch.   She will snack on peanut butter crackers which  she will have some regular soft drinks at lunch             Exercise: none because of shortness of breath and fatigue on exertion         Dietician visit, most recent none               Weight history: Highest weight has been 170  Wt Readings from Last 3 Encounters:  10/28/15 149 lb 12.8 oz (67.949 kg)  10/08/15 148 lb (67.132 kg)  10/02/15 150 lb (68.04 kg)    Glycemic control:   Lab Results  Component Value Date   HGBA1C 8.7* 08/22/2015   HGBA1C 7.7* 06/18/2015   HGBA1C 8.7* 11/28/2014   Lab Results  Component Value Date   MICROALBUR 0.8 05/05/2011   LDLCALC 102 08/22/2015   CREATININE 0.93 10/02/2015         Medication List       This list is accurate as of: 10/28/15 10:48 AM.  Always use your most recent med list.               B-D  ULTRAFINE III SHORT PEN 31G X 8 MM Misc  Generic drug:  Insulin Pen Needle  USE AS DIRECTED     CALTRATE 600+D PO  Take 1 tablet by mouth 2 (two) times daily.     clopidogrel 75 MG tablet  Commonly known as:  PLAVIX  TAKE 1 TABLET BY MOUTH DAILY     doxycycline 100 MG capsule  Commonly known as:  VIBRAMYCIN  Take 1 capsule (100 mg total) by mouth 2 (two) times daily.     ferrous sulfate 325 (65 FE) MG tablet  TAKE 1 TABLET BY MOUTH THREE TIMES DAILY WITH MEALS     furosemide 40 MG tablet  Commonly known as:  LASIX  Take 40 mg by mouth daily.     gemfibrozil 600 MG tablet  Commonly known as:  LOPID  TAKE 1 TABLET BY MOUTH TWICE DAILY BEFORE A MEAL     glucose blood test strip  1 each by Other route 4 (four) times daily -  before meals and at bedtime. Use as instructed     insulin glargine 100 UNIT/ML injection  Commonly known as:  LANTUS  INJECT 50 UNITS UNDER THE SKIN EVERY DAY.     insulin lispro 100 UNIT/ML KiwkPen  Commonly known as:  HUMALOG KWIKPEN  6-10 units at mealtimes     INSULIN SYRINGE 1CC/29G 29G X 1/2" 1 ML Misc     INSULIN SYRINGE 1CC/29G 29G X 1/2" 1 ML Misc  AS DIRECTED.     losartan 100 MG tablet  Commonly known as:  COZAAR  TAKE 1 TABLET BY MOUTH EVERY DAY     metoprolol 50 MG tablet  Commonly known as:  LOPRESSOR  TAKE 1/2 TABLET  BY MOUTH TWICE DAILY     multivitamin tablet  Take 1 tablet by mouth daily.     omega-3 acid ethyl esters 1 g capsule  Commonly known as:  LOVAZA  TAKE 2 CAPSULES BY MOUTH DAILY     omeprazole 20 MG capsule  Commonly known as:  PRILOSEC  TAKE 1 CAPSULE BY MOUTH EVERY DAY     ONE TOUCH ULTRA SYSTEM KIT w/Device Kit  1 kit by Does not apply route once. Tests Blood sugar before meals and at bedtime     potassium chloride SA 20 MEQ tablet  Commonly known as:  K-DUR,KLOR-CON  TAKE 1 TABLET  BY MOUTH TWICE DAILY     pravastatin 20 MG tablet  Commonly known as:  PRAVACHOL  Take 0.5 tablets (10 mg total) by  mouth daily.     pyridOXINE 100 MG tablet  Commonly known as:  VITAMIN B-6  Take 100 mg by mouth daily.        Allergies:  Allergies  Allergen Reactions  . Lipitor [Atorvastatin Calcium] Diarrhea  . Niacin And Related Hives  . Statins Diarrhea  . Macrobid [Nitrofurantoin Macrocrystal] Other (See Comments)    unknown    Past Medical History  Diagnosis Date  . Diabetes mellitus   . Hypertension   . Dyslipidemia   . Varicose veins   . Migraines   . GERD (gastroesophageal reflux disease)   . RSD (reflex sympathetic dystrophy) 2007    R wrist/hand following fx  . Anemia   . Osteoporosis   . Stroke [facility])     Past Surgical History  Procedure Laterality Date  . Umbilical hernia repair    . Breast cyst excision    . Tubal ligation    . Cholecystectomy  1989  . Appendectomy  1966  . Abdominal hysterectomy  1988  . Several benign cyst removed      last 1 in 1972  . Cataract extraction  10/2009    Family History  Problem Relation Age of Onset  . Stroke Mother 23  . Hypertension Mother   . Clotting disorder Father   . Heart attack Father   . Arrhythmia Sister   . Stroke Brother   . Diabetes Neg Hx   . Thyroid disease Neg Hx     Social History:  reports that she has never smoked. She has never used smokeless tobacco. She reports that she does not drink alcohol or use illicit drugs.    Review of Systems   She had a TIA and is taking Plavix  Most recent eye exam was 10/15       Lipids: She is Now on pravastatin, taking gemfibrozil also       Lab Results  Component Value Date   CHOL 208* 08/22/2015   HDL 32* 08/22/2015   LDLCALC 102 08/22/2015   LDLDIRECT 41.8 09/13/2011   TRIG 372* 08/22/2015   CHOLHDL 6.5* 08/22/2015                   Thyroid:    On exam has a 3 cm nodule on the right side but she has refused to consider biopsy  Lab Results  Component Value Date   TSH 2.83 10/04/2014       The blood pressure has been managed with losartan and  metoprolol,         She has a history of Numbness in her feet and toes, mostly later in the day but better overnight.  She has had the symptoms over several months. Recently treated by a podiatrist for also on the right second toe  Last foot exam was in 2/16 showing sensory loss   Physical Examination:  BP 126/62 mmHg  Pulse 87  Temp(Src) 97.9 F (36.6 C)  Resp 14  Ht 5' 4.5" (1.638 m)  Wt 149 lb 12.8 oz (67.949 kg)  BMI 25.33 kg/m2  SpO2 97%  Healing ulcer present on the right second toe on the plantar surface, no redness or swelling No ankle edema    ASSESSMENT /PLAN:  Diabetes type 2, uncontrolled    See history of present illness for detailed discussion of  her current management and problems identified as well as blood sugar patterns  Her blood sugars are generally poorly controlled with last A1c 8.7 Fasting readings are inconsistent Previously has had usually high postprandial readings after supper Again she has been noncompliant with taking her mealtime coverage Most likely because of her low body weight she is insulin-dependent and will need mealtime coverage also along with Lantus   She agrees to start Humalog which is preferred on her insurance with 6 units at suppertime only to start with Discussed that we need to keep her postprandial readings at least under 180 consistently She can adjust the dose based on type of meals she is eating  Continues glucose monitoring sensor was applied today and this will help [place] identify her blood sugar patterns since she is very inconsistent with monitoring her blood sugars during the day Discussed timing of glucose monitoring, blood sugar targets and adjustment of mealtime insulin  To get better basal insulin action will change her on the next visit to [place] Meanwhile will reduce her Lantus by 4 units while starting evening Humalog  NEUROPATHIC foot ulcer: She will follow-up with podiatrist  Patient Instructions  Novolog 6  units at supper before meal and keep 2 hour sugar <180  Lantus 46 units in am   Check blood sugars on waking up 3-4  times a week Also check blood sugars about 2-3 hours after a meal and do this after different meals by rotation  Recommended blood sugar levels on waking up is 90-130 and about 2 hours after meal is 130-180  Please bring your blood sugar monitor to each visit, thank you      Counseling time on subjects discussed above is over 50% of today's 25 minute visit  Rashea Hoskie 10/28/2015, 10:48 AM   Note: This office note was prepared with Dragon voice recognition system technology. Any transcriptional errors that result from this process are unintentional.

## 2015-10-28 NOTE — Telephone Encounter (Signed)
Libre sensor inserted on Left upper out arm area.  Discussed with her how to keep a record of meals, insulin dose and blood sugars.  Discussed the importance of not taking Tylenol and large doses of Vit. C.  She had no final questions.  She will return in 2 weeks with meal record and meter to download sensor.

## 2015-10-31 ENCOUNTER — Other Ambulatory Visit: Payer: Self-pay | Admitting: Family Medicine

## 2015-10-31 NOTE — Telephone Encounter (Signed)
Refill appropriate and filled per protocol. 

## 2015-11-06 ENCOUNTER — Ambulatory Visit (INDEPENDENT_AMBULATORY_CARE_PROVIDER_SITE_OTHER): Payer: Medicare Other | Admitting: Podiatry

## 2015-11-06 ENCOUNTER — Encounter: Payer: Self-pay | Admitting: Podiatry

## 2015-11-06 VITALS — BP 152/70 | HR 61 | Resp 16

## 2015-11-06 DIAGNOSIS — L89891 Pressure ulcer of other site, stage 1: Secondary | ICD-10-CM

## 2015-11-06 DIAGNOSIS — L97509 Non-pressure chronic ulcer of other part of unspecified foot with unspecified severity: Secondary | ICD-10-CM

## 2015-11-06 DIAGNOSIS — M2041 Other hammer toe(s) (acquired), right foot: Secondary | ICD-10-CM

## 2015-11-06 DIAGNOSIS — E11621 Type 2 diabetes mellitus with foot ulcer: Secondary | ICD-10-CM

## 2015-11-06 NOTE — Progress Notes (Signed)
Subjective:     Patient ID: Rebecca Perkins, female   DOB: March 23, 1929, 82 y.o.   MRN: [id number]  HPI patient states it's improved but I still wanted to get my right second toe checked   Review of Systems     Objective:   Physical Exam Neurovascular status intact with digital deformity right second toe with distal keratotic lesion and long-term diabetes as complicating factor    Assessment:     At risk diabetic with ulceration history right second digit with keratotic lesion formation and minimal drainage noted    Plan:     Discussed hammertoe repair possible amputation which may be necessary the distal phalanx right second toe and at this time using sterile his mentation debrided the tissue applied a small amount of Iodosorb with dressing and advised on soaks and continuing to use for spanning. Reappoint if symptoms were to get worse or persist

## 2015-11-07 ENCOUNTER — Ambulatory Visit (INDEPENDENT_AMBULATORY_CARE_PROVIDER_SITE_OTHER): Payer: Medicare Other | Admitting: Family Medicine

## 2015-11-07 ENCOUNTER — Encounter: Payer: Self-pay | Admitting: Family Medicine

## 2015-11-07 VITALS — BP 122/68 | HR 86 | Temp 97.4°F | Resp 16 | Ht 64.0 in | Wt 150.0 lb

## 2015-11-07 DIAGNOSIS — R0989 Other specified symptoms and signs involving the circulatory and respiratory systems: Secondary | ICD-10-CM

## 2015-11-07 DIAGNOSIS — E781 Pure hyperglyceridemia: Secondary | ICD-10-CM

## 2015-11-07 LAB — LIPID PANEL
Cholesterol: 197 mg/dL (ref 125–200)
HDL: 28 mg/dL — ABNORMAL LOW (ref >=46)
Total CHOL/HDL Ratio: 7 Ratio — ABNORMAL HIGH (ref <=5.0)
Triglycerides: 707 mg/dL — ABNORMAL HIGH (ref <150)

## 2015-11-07 LAB — COMPLETE METABOLIC PANEL WITH GFR
ALT: 14 U/L (ref 6–29)
AST: 16 U/L (ref 10–35)
Albumin: 3.7 g/dL (ref 3.6–5.1)
Alkaline Phosphatase: 73 U/L (ref 33–130)
BUN: 15 mg/dL (ref 7–25)
CO2: 26 mmol/L (ref 20–31)
Calcium: 9.2 mg/dL (ref 8.6–10.4)
Chloride: 108 mmol/L (ref 98–110)
Creat: 0.86 mg/dL (ref 0.60–0.88)
GFR, Est African American: 72 mL/min (ref >=60)
GFR, Est Non African American: 62 mL/min (ref >=60)
Glucose, Bld: 150 mg/dL — ABNORMAL HIGH (ref 70–99)
Potassium: 4.7 mmol/L (ref 3.5–5.3)
Sodium: 141 mmol/L (ref 135–146)
Total Bilirubin: 0.5 mg/dL (ref 0.2–1.2)
Total Protein: 6.5 g/dL (ref 6.1–8.1)

## 2015-11-07 NOTE — Progress Notes (Signed)
Subjective:    Patient ID: Rebecca Perkins, female    DOB: 04-Nov-1928, 82 y.o.   MRN: [id number]  HPI 1/17 Here for a CPE.  She has a history of a hysterectomy and therefore does not require a Pap smear. She has never had a colonoscopy and would be due for such however given her age she agrees that it is not prudent to proceed with a colonoscopy. Otherwise she is doing well. Her immunizations including her Pneumovax, Prevnar 13, and flu shot are up-to-date.  Mammogram was performed 12/16 and was normal.  DEXA 7/16 revelaed T score -2.1 in forearm.  In November, the patient was admitted to the hospital with a TIA. Echocardiogram revealed an ejection fraction of 50-55% but no cardioembolic source for stroke. Carotid Dopplers revealed 1-39% stenosis bilaterally in the internal carotid arteries. Her blood pressure today is excellent. She was switched to Plavix for secondary prevention of stroke and TIA. Unfortunately her blood sugar and her cholesterol remained poorly controlled. She has a history of intolerance to all statins. She has never tried pravastatin. Her endocrinologist recently started her on Victoza however the patient has not started it.  11/07/15 Patient never started victoza.  Her endocrinologist and started her back on mealtime Humalog. She states that her fasting blood sugars are running between 90 and 120 and her two-hour postprandial sugars are between 180 and 200. I placed her on pravastatin in addition to the gemfibrozil. The patient inadvertently discontinued gemfibrozil and is only taking pravastatin. Furthermore she discontinued pravastatin one week ago due to myalgias in her legs. Recently she had an infection in her right second toe that was treated as cellulitis and improved. On examination today she has palpable popliteal pulses bilaterally. She has strong dorsalis pedis pulses in her left foot however in her right foot she has very weak pedal pulses. No visits with results  within 1 Month(s) from this visit. Latest known visit with results is:  Admission on 10/02/2015, Discharged on 10/02/2015  Component Date Value Ref Range Status  . WBC 10/02/2015 8.5  4.0 - 10.5 K/uL Final  . RBC 10/02/2015 4.24  3.87 - 5.11 MIL/uL Final  . Hemoglobin 10/02/2015 11.9* 12.0 - 15.0 g/dL Final  . HCT 10/02/2015 36.1  36.0 - 46.0 % Final  . MCV 10/02/2015 85.1  78.0 - 100.0 fL Final  . MCH 10/02/2015 28.1  26.0 - 34.0 pg Final  . MCHC 10/02/2015 33.0  30.0 - 36.0 g/dL Final  . RDW 10/02/2015 13.0  11.5 - 15.5 % Final  . Platelets 10/02/2015 178  150 - 400 K/uL Final  . Neutrophils Relative % 10/02/2015 68   Final  . Neutro Abs 10/02/2015 5.7  1.7 - 7.7 K/uL Final  . Lymphocytes Relative 10/02/2015 24   Final  . Lymphs Abs 10/02/2015 2.0  0.7 - 4.0 K/uL Final  . Monocytes Relative 10/02/2015 7   Final  . Monocytes Absolute 10/02/2015 0.6  0.1 - 1.0 K/uL Final  . Eosinophils Relative 10/02/2015 1   Final  . Eosinophils Absolute 10/02/2015 0.1  0.0 - 0.7 K/uL Final  . Basophils Relative 10/02/2015 0   Final  . Basophils Absolute 10/02/2015 0.0  0.0 - 0.1 K/uL Final  . Sodium 10/02/2015 139  135 - 145 mmol/L Final  . Potassium 10/02/2015 4.4  3.5 - 5.1 mmol/L Final  . Chloride 10/02/2015 106  101 - 111 mmol/L Final  . CO2 10/02/2015 25  22 - 32 mmol/L Final  .  Glucose, Bld 10/02/2015 272* 65 - 99 mg/dL Final  . BUN 10/02/2015 20  6 - 20 mg/dL Final  . Creatinine, Ser 10/02/2015 0.93  0.44 - 1.00 mg/dL Final  . Calcium 10/02/2015 9.1  8.9 - 10.3 mg/dL Final  . GFR calc non Af Amer 10/02/2015 55* >60 mL/min Final  . GFR calc Af Amer 10/02/2015 >60  >60 mL/min Final   Comment: (NOTE) The eGFR has been calculated using the CKD EPI equation. This calculation has not been validated in all clinical situations. eGFR's persistently <60 mL/min signify possible Chronic Kidney Disease.   . Anion gap 10/02/2015 8  5 - 15 Final    Past Medical History  Diagnosis Date  .  Diabetes mellitus   . Hypertension   . Dyslipidemia   . Varicose veins   . Migraines   . GERD (gastroesophageal reflux disease)   . RSD (reflex sympathetic dystrophy) 2007    R wrist/hand following fx  . Anemia   . Osteoporosis   . Stroke [facility])    Past Surgical History  Procedure Laterality Date  . Umbilical hernia repair    . Breast cyst excision    . Tubal ligation    . Cholecystectomy  1989  . Appendectomy  1966  . Abdominal hysterectomy  1988  . Several benign cyst removed      last 1 in 1972  . Cataract extraction  10/2009   Current Outpatient Prescriptions on File Prior to Visit  Medication Sig Dispense Refill  . B-D ULTRAFINE III SHORT PEN 31G X 8 MM MISC USE AS DIRECTED 100 each 5  . Blood Glucose Monitoring Suppl (ONE TOUCH ULTRA SYSTEM KIT) W/DEVICE KIT 1 kit by Does not apply route once. Tests Blood sugar before meals and at bedtime 1 each 0  . Calcium Carbonate-Vitamin D (CALTRATE 600+D PO) Take 1 tablet by mouth 2 (two) times daily.    . clopidogrel (PLAVIX) 75 MG tablet TAKE 1 TABLET BY MOUTH DAILY 30 tablet 3  . ferrous sulfate 325 (65 FE) MG tablet TAKE 1 TABLET BY MOUTH THREE TIMES DAILY WITH MEALS 90 tablet 4  . furosemide (LASIX) 40 MG tablet Take 40 mg by mouth daily.     Marland Kitchen gemfibrozil (LOPID) 600 MG tablet TAKE 1 TABLET BY MOUTH TWICE DAILY BEFORE A MEAL 60 tablet 5  . glucose blood test strip 1 each by Other route 4 (four) times daily -  before meals and at bedtime. Use as instructed 150 each 12  . insulin glargine (LANTUS) 100 UNIT/ML injection INJECT 50 UNITS UNDER THE SKIN EVERY DAY. (Patient taking differently: Inject 46 Units into the skin daily. INJECT 50 UNITS UNDER THE SKIN EVERY DAY.) 20 mL 3  . insulin lispro (HUMALOG KWIKPEN) 100 UNIT/ML KiwkPen 6-10 units at mealtimes 15 mL 1  . INSULIN SYRINGE 1CC/29G 29G X 1/2" 1 ML MISC   6  . INSULIN SYRINGE 1CC/29G 29G X 1/2" 1 ML MISC AS DIRECTED. 100 each 3  . losartan (COZAAR) 100 MG tablet TAKE 1 TABLET  BY MOUTH EVERY DAY 30 tablet 3  . metoprolol (LOPRESSOR) 50 MG tablet TAKE 1/2 TABLET  BY MOUTH TWICE DAILY 180 tablet 0  . Multiple Vitamin (MULTIVITAMIN) tablet Take 1 tablet by mouth daily.      Marland Kitchen omega-3 acid ethyl esters (LOVAZA) 1 g capsule TAKE 2 CAPSULES BY MOUTH DAILY 60 capsule 11  . omeprazole (PRILOSEC) 20 MG capsule TAKE ONE CAPSULE BY MOUTH DAILY 30 capsule 3  .  potassium chloride SA (K-DUR,KLOR-CON) 20 MEQ tablet TAKE 1 TABLET BY MOUTH TWICE DAILY 180 tablet 0  . pravastatin (PRAVACHOL) 20 MG tablet Take 0.5 tablets (10 mg total) by mouth daily. 30 tablet 5  . Pyridoxine HCl (VITAMIN B-6) 100 MG tablet Take 100 mg by mouth daily.       No current facility-administered medications on file prior to visit.   Allergies  Allergen Reactions  . Lipitor [Atorvastatin Calcium] Diarrhea  . Niacin And Related Hives  . Statins Diarrhea  . Macrobid [Nitrofurantoin Macrocrystal] Other (See Comments)    unknown   Social History   Social History  . Marital Status: Married    Spouse Name: N/A  . Number of Children: 3  . Years of Education: N/A   Occupational History  . Retired    Social History Main Topics  . Smoking status: Never Smoker   . Smokeless tobacco: Never Used  . Alcohol Use: No  . Drug Use: No  . Sexual Activity: Not on file   Other Topics Concern  . 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 Stroke Mother 44  . Hypertension Mother   . Clotting disorder Father   . Heart attack Father   . Arrhythmia Sister   . Stroke Brother   . Diabetes Neg Hx   . Thyroid disease Neg Hx       Review of Systems  All other systems reviewed and are negative.      Objective:   Physical Exam  Constitutional: She is oriented to person, place, and time. She appears well-developed and  well-nourished. No distress.  HENT:  Head: Normocephalic and atraumatic.  Right Ear: External ear normal.  Left Ear: External ear normal.  Nose: Nose normal.  Mouth/Throat: Oropharynx is clear and moist. No oropharyngeal exudate.  Eyes: Conjunctivae and EOM are normal. Pupils are equal, round, and reactive to light. Right eye exhibits no discharge. Left eye exhibits no discharge. No scleral icterus.  Neck: Normal range of motion. Neck supple. No JVD present. No tracheal deviation present. No thyromegaly present.  Cardiovascular: Normal rate, regular rhythm, normal heart sounds and intact distal pulses.  Exam reveals no gallop and no friction rub.   No murmur heard. Pulmonary/Chest: Effort normal and breath sounds normal. No stridor. No respiratory distress. She has no wheezes. She has no rales. She exhibits no tenderness.  Abdominal: Soft. Bowel sounds are normal. She exhibits no distension and no mass. There is no tenderness. There is no rebound and no guarding.  Musculoskeletal: Normal range of motion. She exhibits no edema or tenderness.  Lymphadenopathy:    She has no cervical adenopathy.  Neurological: She is alert and oriented to person, place, and time. She has normal reflexes. No cranial nerve deficit. She exhibits normal muscle tone. Coordination normal.  Skin: Skin is warm. No rash noted. She is not diaphoretic. No erythema. No pallor.  Psychiatric: She has a normal mood and affect. Her behavior is normal. Judgment and thought content normal.  Vitals reviewed.         Assessment & Plan:  Hypertriglyceridemia - Plan: COMPLETE METABOLIC PANEL WITH GFR, Lipid panel  Decreased pedal pulses - Plan: LE ART SEG MULTI (Segm & LE Reynauds) Schedule the patient for arterial Dopplers to evaluate for peripheral  vascular disease given the decreased pedal pulses in the right foot and her recent cellulitis. Her blood pressures well controlled. Her diabetes is being managed by her  endocrinologist. I will check a fasting lipid panel. However the patient cannot tolerate any statin and she refuses to take future statins. Therefore I would recommend her going back on the gemfibrozil

## 2015-11-11 ENCOUNTER — Telehealth: Payer: Self-pay | Admitting: Nutrition

## 2015-11-11 ENCOUNTER — Encounter: Payer: Medicare Other | Admitting: Nutrition

## 2015-11-11 NOTE — Telephone Encounter (Signed)
Sensor removed, and review of diet history and insulin dose.  Patient is still drinking 1/2 1/2 sweet tea and coke.  She was shown the blood sugar rise from theses.  Results given to Dr. Dwyane Dee

## 2015-11-14 ENCOUNTER — Ambulatory Visit: Payer: Medicare Other | Admitting: Dietician

## 2015-11-18 ENCOUNTER — Ambulatory Visit: Payer: Medicare Other | Admitting: Endocrinology

## 2015-12-01 ENCOUNTER — Telehealth: Payer: Self-pay | Admitting: Family Medicine

## 2015-12-01 ENCOUNTER — Encounter: Payer: Self-pay | Admitting: Podiatry

## 2015-12-01 ENCOUNTER — Ambulatory Visit (INDEPENDENT_AMBULATORY_CARE_PROVIDER_SITE_OTHER): Payer: Medicare Other

## 2015-12-01 ENCOUNTER — Other Ambulatory Visit: Payer: Self-pay | Admitting: Family Medicine

## 2015-12-01 ENCOUNTER — Ambulatory Visit (INDEPENDENT_AMBULATORY_CARE_PROVIDER_SITE_OTHER): Payer: Medicare Other | Admitting: Podiatry

## 2015-12-01 VITALS — BP 147/77 | HR 66 | Resp 16

## 2015-12-01 DIAGNOSIS — E11621 Type 2 diabetes mellitus with foot ulcer: Secondary | ICD-10-CM

## 2015-12-01 DIAGNOSIS — L97509 Non-pressure chronic ulcer of other part of unspecified foot with unspecified severity: Secondary | ICD-10-CM

## 2015-12-01 DIAGNOSIS — M79674 Pain in right toe(s): Secondary | ICD-10-CM

## 2015-12-01 DIAGNOSIS — R0989 Other specified symptoms and signs involving the circulatory and respiratory systems: Secondary | ICD-10-CM

## 2015-12-01 MED ORDER — CEPHALEXIN 500 MG PO CAPS: 500.0000 mg | ORAL_CAPSULE | Freq: Three times a day (TID) | ORAL | Status: DC

## 2015-12-01 NOTE — Telephone Encounter (Signed)
Have schedule vascular study for Friday 12/05/15 at [facility])  Arrive at 140PM for 2PM appt.

## 2015-12-01 NOTE — Telephone Encounter (Signed)
[phone] Patient calling regarding a referral that was supposed to be in place for her, she has not heard anything. Please call her at [phone]

## 2015-12-01 NOTE — Telephone Encounter (Signed)
Vascular studies were ordered early in April.  Not sure why not done yet.  Message left for someone.  Told pt sorry for delay will get ordered this week.

## 2015-12-02 NOTE — Telephone Encounter (Signed)
Pt aware of appt.

## 2015-12-04 ENCOUNTER — Telehealth: Payer: Self-pay | Admitting: *Deleted

## 2015-12-04 LAB — WOUND CULTURE: Gram Stain: NONE SEEN

## 2015-12-04 NOTE — Progress Notes (Signed)
Subjective:     Patient ID: Rebecca Perkins, female   DOB: 03-Jun-1929, 82 y.o.   MRN: [id number]  HPI patient states that her right second toe has started to become warm again in the last couple days and it was doing fine until then. Patient states that she has been walking on   Review of Systems     Objective:   Physical Exam Neurovascular status found to be intact with patient having localized redness the right second toe with distal irritation of the tissue and a small amount of distal breakdown within the second digit right foot. It is localized to this area and there is no proximal edema erythema currently at this time and no systemic signs of infection. States her sugar has been running okay at this time. The opening measures approximately 2 x 1 mm    Assessment:     Distal ulceration  of second digit right with possibility that there may be an osteomyelitic episode with cellulitic event. At this time localized    Plan:     Reoccurrence of distal ulceration with localized cellulitis of the right second toe that had improve with previous treatment and is just reoccur in the last several days. Today I went ahead and x-rayed and reviewed clean the area out did get a culture place him Iodosorb with sterile dressing and advised of any proximal redness were to occur to let us know immediately and if any systemic signs of infection were to occur or fever that she is to go straight to the emergency room. I did discuss that there is a strong possibility she is going to lose this toe long-term  X-ray indicates there is some lysis of the distal phalanx right indicating probable osteomyelitic changes and S1 have to be evaluated with possibility for long-term amputation

## 2015-12-04 NOTE — Telephone Encounter (Addendum)
Pt states she was seen 12/01/2015, Dr. Paulla Dolly instructed her to call with toe's status.  Pt states the toe has worsened even from 12/01/2015, and is now red down to where the toe joins the foot.  Dr. Paulla Dolly states get pt in to be seen 12/05/2015.  Informed pt of Dr. Mellody Drown orders to continue medication and wound care as instructed until seen in office 12/05/2015.  Pt states understanding and is transferred to schedulers.

## 2015-12-05 ENCOUNTER — Ambulatory Visit (INDEPENDENT_AMBULATORY_CARE_PROVIDER_SITE_OTHER): Payer: Medicare Other | Admitting: Podiatry

## 2015-12-05 ENCOUNTER — Encounter: Payer: Self-pay | Admitting: Podiatry

## 2015-12-05 ENCOUNTER — Ambulatory Visit
Admission: RE | Admit: 2015-12-05 | Discharge: 2015-12-05 | Disposition: A | Discharge status: Home / Self Care | Payer: Medicare Other | Source: Ambulatory Visit | Attending: Family Medicine | Admitting: Family Medicine

## 2015-12-05 ENCOUNTER — Ambulatory Visit: Payer: Medicare Other | Admitting: Dietician

## 2015-12-05 VITALS — BP 126/62 | HR 64 | Resp 12

## 2015-12-05 DIAGNOSIS — L97509 Non-pressure chronic ulcer of other part of unspecified foot with unspecified severity: Secondary | ICD-10-CM | POA: Diagnosis not present

## 2015-12-05 DIAGNOSIS — E1149 Type 2 diabetes mellitus with other diabetic neurological complication: Secondary | ICD-10-CM

## 2015-12-05 DIAGNOSIS — E11621 Type 2 diabetes mellitus with foot ulcer: Secondary | ICD-10-CM | POA: Diagnosis not present

## 2015-12-05 DIAGNOSIS — R0989 Other specified symptoms and signs involving the circulatory and respiratory systems: Secondary | ICD-10-CM

## 2015-12-05 DIAGNOSIS — E1140 Type 2 diabetes mellitus with diabetic neuropathy, unspecified: Secondary | ICD-10-CM | POA: Diagnosis not present

## 2015-12-05 NOTE — Progress Notes (Signed)
Subjective:     Patient ID: Rebecca Perkins, female   DOB: 1929/04/09, 82 y.o.   MRN: [id number]  HPI patient presents stating that she was worried because her second toe had turn colors and it now looks better but she still wanted to have it checked. She presents with son   Review of Systems     Objective:   Physical Exam Neurovascular status unchanged with erythema in the right second digit light in color with no proximal erythema to the metatarsophalangeal joint and no increased calor or other pathology noted. Patient's distal keratotic lesion is no longer draining and is crusted over    Assessment:     Appears to be responding to antibiotic at this time with no indication of systemic infection    Plan:     Discussed that it still could occur but antibiotics appear to be handling the situation currently and she will continue to take them. Discussed eventually amputation may be necessary of the toe but we'll continue to work with it conservatively and evaluate results next week or she has appointment. She is encouraged to call or go to the emergency room if any further redness should occur

## 2015-12-09 ENCOUNTER — Ambulatory Visit: Payer: Medicare Other

## 2015-12-10 ENCOUNTER — Encounter: Payer: Self-pay | Admitting: Podiatry

## 2015-12-10 ENCOUNTER — Ambulatory Visit (INDEPENDENT_AMBULATORY_CARE_PROVIDER_SITE_OTHER): Payer: Medicare Other | Admitting: Podiatry

## 2015-12-10 VITALS — BP 135/65 | HR 57 | Resp 16

## 2015-12-10 DIAGNOSIS — E11621 Type 2 diabetes mellitus with foot ulcer: Secondary | ICD-10-CM

## 2015-12-10 DIAGNOSIS — L97509 Non-pressure chronic ulcer of other part of unspecified foot with unspecified severity: Secondary | ICD-10-CM | POA: Diagnosis not present

## 2015-12-10 DIAGNOSIS — M2041 Other hammer toe(s) (acquired), right foot: Secondary | ICD-10-CM | POA: Diagnosis not present

## 2015-12-10 NOTE — Progress Notes (Signed)
Subjective:     Patient ID: Rebecca Perkins, female   DOB: 1929-06-24, 82 y.o.   MRN: [id number]  HPI patient presents stating my second toe seems to be getting better but I wanted checked   Review of Systems     Objective:   Physical Exam Neurovascular status unchanged with keratotic lesion distal second digit right with history of ulceration with minimal redness noted currently and no proximal edema erythema or drainage noted    Assessment:     Seems to be improving from having had a distal ulceration right second toe    Plan:     Went ahead today and I debrided the tissue and I advised this patient on continued buttress pad usage and the fact that amputation still may be necessary if infection were to reoccur. Finish antibiotic at this time

## 2015-12-11 ENCOUNTER — Encounter: Payer: Self-pay | Admitting: Family Medicine

## 2015-12-15 ENCOUNTER — Ambulatory Visit (INDEPENDENT_AMBULATORY_CARE_PROVIDER_SITE_OTHER): Payer: Medicare Other | Admitting: Endocrinology

## 2015-12-15 ENCOUNTER — Encounter: Payer: Self-pay | Admitting: Endocrinology

## 2015-12-15 VITALS — BP 122/64 | HR 71 | Temp 98.1°F | Resp 14 | Ht 64.0 in | Wt 149.2 lb

## 2015-12-15 DIAGNOSIS — E119 Type 2 diabetes mellitus without complications: Secondary | ICD-10-CM | POA: Diagnosis not present

## 2015-12-15 LAB — POCT GLYCOSYLATED HEMOGLOBIN (HGB A1C): Hemoglobin A1C: 8.5

## 2015-12-15 NOTE — Progress Notes (Signed)
Patient ID: Rebecca Perkins, female   DOB: Jan 18, 1929, 82 y.o.   MRN: [id number]            Reason for Appointment: Follow-up for Type 2 Diabetes  Referring physician: Pickard  History of Present Illness:          Diagnosis: Type 2 diabetes mellitus, date of diagnosis: 2006       Past history:  At the time of diagnosis she was feeling tired and weak but does not know what her initial blood sugar was. She probably was tried on metformin initially but because of GI side effects discussed not be continued Also did not tolerate Actos because of nausea. She thinks that she was started on insulin within a few months of her initial diagnosis  Not clear what insulin she was trying in the beginning but she thinks it was NovoLog; however for the last few years has been on Lantus only Her blood sugars have been persistently poorly controlled with A1c usually 8-9% about 2011 At initial consultation she was taking 60 units of Lantus insulin without any mealtime coverage Because of her tendency to low blood sugars including overnight she was told to reduce the dose to 50 units  Recent history:   INSULIN regimen is described as: 46Lantus in am with syringe, Humalog 6 Units, acs   A1c is higher this year at 8.7 and now it is 8.5 On her visit in 3/17 because of her high A1c she was advised to start taking mealtime insulin at suppertime which she had previously resisted doing; at the same time Lantus was reduced by 4 units  Current management, blood sugars and problems identified:  She has again been checking blood sugars only in the morning recently although did have some night readings last month  She has been compliant with taking her Humalog with suppertime everyday, using 6 units  Not clear which readings in her evening testing were after meals but she did have mostly high readings except for 2  A fasting readings have been fluctuating markedly but only mildly increased on an average  She  thinks some of her high fasting readings are when she goes off her diet in the evening such as eating fried food  She had more consistently high readings for 4 or 5 days at the end of April and she does not know why, did not get any steroids  She still has not stopped regular soft drinks even though she has cut back        Oral hypoglycemic drugs the patient is taking are:   none     Side effects from medications have been: Metformin, Actos: nausea, diarrhea    Compliance with the medical regimen: Fair  Hypoglycemia: None recently  Glucose monitoring:  done 1 times a day         Glucometer: One Touch.      Blood Glucose readings    Mean values apply above for all meters except median for One Touch  PRE-MEAL Fasting Lunch Dinner 9-11 PM  Overall  Glucose range: 86-269    72-350    Mean/median: 145     206  167      Self-care: The diet that the patient has been following is: tries to limit portions .     Meals: 2-3 meals per day. Breakfast is variable, bacon, fruit egg    Usually has an egg.  May Occasionally have fried seafood, baked potato and hush puppies with dinner  otherwise has relatively low amount of starch.   She will snack on peanut butter crackers which she will have some regular soft drinks at lunch             Exercise: none because of shortness of breath and fatigue on exertion         Dietician visit, most recent none               Weight history: Highest weight has been 170  Wt Readings from Last 3 Encounters:  12/15/15 149 lb 3.2 oz (67.677 kg)  11/07/15 150 lb (68.04 kg)  10/28/15 149 lb 12.8 oz (67.949 kg)    Glycemic control:   Lab Results  Component Value Date   HGBA1C 8.5 12/15/2015   HGBA1C 8.7* 08/22/2015   HGBA1C 7.7* 06/18/2015   Lab Results  Component Value Date   MICROALBUR 0.8 05/05/2011   LDLCALC NOT CALC 11/07/2015   CREATININE 0.86 11/07/2015         Medication List       This list is accurate as of: 12/15/15  9:16 PM.  Always  use your most recent med list.               B-D ULTRAFINE III SHORT PEN 31G X 8 MM Misc  Generic drug:  Insulin Pen Needle  USE AS DIRECTED     CALTRATE 600+D PO  Take 1 tablet by mouth 2 (two) times daily.     cephALEXin 500 MG capsule  Commonly known as:  KEFLEX  Take 1 capsule (500 mg total) by mouth 3 (three) times daily.     clopidogrel 75 MG tablet  Commonly known as:  PLAVIX  TAKE 1 TABLET BY MOUTH DAILY     ferrous sulfate 325 (65 FE) MG tablet  TAKE 1 TABLET BY MOUTH THREE TIMES DAILY WITH MEALS     furosemide 40 MG tablet  Commonly known as:  LASIX  Take 40 mg by mouth daily.     gemfibrozil 600 MG tablet  Commonly known as:  LOPID  TAKE 1 TABLET BY MOUTH TWICE DAILY BEFORE A MEAL     glucose blood test strip  1 each by Other route 4 (four) times daily -  before meals and at bedtime. Use as instructed     insulin glargine 100 UNIT/ML injection  Commonly known as:  LANTUS  INJECT 50 UNITS UNDER THE SKIN EVERY DAY.     insulin lispro 100 UNIT/ML KiwkPen  Commonly known as:  HUMALOG KWIKPEN  6-10 units at mealtimes     INSULIN SYRINGE 1CC/29G 29G X 1/2" 1 ML Misc     INSULIN SYRINGE 1CC/29G 29G X 1/2" 1 ML Misc  AS DIRECTED.     losartan 100 MG tablet  Commonly known as:  COZAAR  TAKE 1 TABLET BY MOUTH EVERY DAY     metoprolol 50 MG tablet  Commonly known as:  LOPRESSOR  TAKE 1/2 TABLET  BY MOUTH TWICE DAILY     multivitamin tablet  Take 1 tablet by mouth daily.     omega-3 acid ethyl esters 1 g capsule  Commonly known as:  LOVAZA  TAKE 2 CAPSULES BY MOUTH DAILY     omeprazole 20 MG capsule  Commonly known as:  PRILOSEC  TAKE ONE CAPSULE BY MOUTH DAILY     ONE TOUCH ULTRA SYSTEM KIT w/Device Kit  1 kit by Does not apply route once. Tests Blood sugar before meals and at bedtime  potassium chloride SA 20 MEQ tablet  Commonly known as:  K-DUR,KLOR-CON  TAKE 1 TABLET BY MOUTH TWICE DAILY     pravastatin 20 MG tablet  Commonly known as:   PRAVACHOL  Take 0.5 tablets (10 mg total) by mouth daily.     pyridOXINE 100 MG tablet  Commonly known as:  VITAMIN B-6  Take 100 mg by mouth daily.        Allergies:  Allergies  Allergen Reactions  . Lipitor [Atorvastatin Calcium] Diarrhea  . Niacin And Related Hives  . Statins Diarrhea  . Macrobid [Nitrofurantoin Macrocrystal] Other (See Comments)    unknown    Past Medical History  Diagnosis Date  . Diabetes mellitus   . Hypertension   . Dyslipidemia   . Varicose veins   . Migraines   . GERD (gastroesophageal reflux disease)   . RSD (reflex sympathetic dystrophy) 2007    R wrist/hand following fx  . Anemia   . Osteoporosis   . Stroke ([place])   . PVD (peripheral vascular disease) [facility]     Past Surgical History  Procedure Laterality Date  . Umbilical hernia repair    . Breast cyst excision    . Tubal ligation    . Cholecystectomy  1989  . Appendectomy  1966  . Abdominal hysterectomy  1988  . Several benign cyst removed      last 1 in 1972  . Cataract extraction  10/2009    Family History  Problem Relation Age of Onset  . Stroke Mother 13  . Hypertension Mother   . Clotting disorder Father   . Heart attack Father   . Arrhythmia Sister   . Stroke Brother   . Diabetes Neg Hx   . Thyroid disease Neg Hx     Social History:  reports that she has never smoked. She has never used smokeless tobacco. She reports that she does not drink alcohol or use illicit drugs.    Review of Systems   Following is a copy of the previous note:  She had a TIA and is taking Plavix  Most recent eye exam was 10/15       Lipids: She is Now on pravastatin, taking gemfibrozil also       Lab Results  Component Value Date   CHOL 197 11/07/2015   HDL 28* 11/07/2015   LDLCALC NOT CALC 11/07/2015   LDLDIRECT 41.8 09/13/2011   TRIG 707* 11/07/2015   CHOLHDL 7.0* 11/07/2015                   Thyroid:    On exam has a 3 cm nodule on the right side but she has refused to  consider biopsy  Lab Results  Component Value Date   TSH 2.83 10/04/2014       The blood pressure has been managed with losartan and metoprolol,         She has a history of Numbness in her feet and toes, mostly later in the day but better overnight.  She has had the symptoms over several months.  treated by a podiatrist for  on the right second toe  Last foot exam was in 2/16 showing sensory loss   Physical Examination:  BP 122/64 mmHg  Pulse 71  Temp(Src) 98.1 F (36.7 C)  Resp 14  Ht '5\' 4"'$  (1.626 m)  Wt 149 lb 3.2 oz (67.677 kg)  BMI 25.60 kg/m2  SpO2 96%      ASSESSMENT Kathyrn Lass:  Diabetes type 2, uncontrolled    See history of present illness for detailed discussion of her current management and problems identified as well as blood sugar patterns  Her blood sugars are only under fair control with A1c is still over 8% However considering her age this may be adequate She appears to be needing mealtime insulin in the evening at suppertime and has not had any low sugars with taking 6 units However she is very noncompliant with checking her sugars after supper recently Also not clear why she had marked increase in blood sugars on some days last month Fasting readings are overall improving but inconsistent  Discussed needing to check readings after supper at least every other day Will consider Toujeo insulin when she finishes her supply of Lantus which she is using in the form of a syringe and vial Follow-up in 2 months for further adjustment  Patient Instructions  Must check sugars after supper every 2 days  Need to change lantus, call for new Rx    [facility] 12/15/2015, 9:16 PM   Note: This office note was prepared with Dragon voice recognition system technology. Any transcriptional errors that result from this process are unintentional.

## 2015-12-15 NOTE — Patient Instructions (Addendum)
Must check sugars after supper every 2 days  Need to change lantus, call for new Rx

## 2015-12-22 ENCOUNTER — Other Ambulatory Visit: Payer: Self-pay | Admitting: *Deleted

## 2015-12-22 ENCOUNTER — Telehealth: Payer: Self-pay | Admitting: Endocrinology

## 2015-12-22 MED ORDER — INSULIN GLARGINE 300 UNIT/ML ~~LOC~~ SOPN: 46.0000 [IU] | PEN_INJECTOR | Freq: Every day | SUBCUTANEOUS | Status: DC

## 2015-12-22 MED ORDER — INSULIN PEN NEEDLE 31G X 8 MM MISC: Status: DC

## 2015-12-22 NOTE — Telephone Encounter (Signed)
Patient wanted you to know that she is on her last bottle of Lantus now, in your last note you talked about switching her to [facility], please advise on dose and if it's the pen or vial you want sent in?

## 2015-12-22 NOTE — Telephone Encounter (Signed)
Noted, rx sent.

## 2015-12-22 NOTE — Telephone Encounter (Signed)
Patient ask you to call her, it's in reference to the change Rebecca Perkins wanted you to know about.

## 2015-12-22 NOTE — Telephone Encounter (Signed)
46 units of Toujeo once a day, is only in a pen

## 2015-12-23 ENCOUNTER — Ambulatory Visit (INDEPENDENT_AMBULATORY_CARE_PROVIDER_SITE_OTHER): Payer: Medicare Other | Admitting: Family Medicine

## 2015-12-23 ENCOUNTER — Encounter: Payer: Self-pay | Admitting: Family Medicine

## 2015-12-23 VITALS — BP 146/78 | HR 60 | Temp 98.0°F | Resp 18 | Wt 146.0 lb

## 2015-12-23 DIAGNOSIS — Z87440 Personal history of urinary (tract) infections: Secondary | ICD-10-CM

## 2015-12-23 DIAGNOSIS — N390 Urinary tract infection, site not specified: Secondary | ICD-10-CM

## 2015-12-23 LAB — URINALYSIS, MICROSCOPIC ONLY
Casts: NONE SEEN [LPF]
Crystals: NONE SEEN [HPF]
Yeast: NONE SEEN [HPF]

## 2015-12-23 LAB — URINALYSIS, ROUTINE W REFLEX MICROSCOPIC
Bilirubin Urine: NEGATIVE
Glucose, UA: NEGATIVE
Ketones, ur: NEGATIVE
Nitrite: POSITIVE — AB
Specific Gravity, Urine: 1.02 (ref 1.001–1.035)
pH: 5.5 (ref 5.0–8.0)

## 2015-12-23 MED ORDER — CIPROFLOXACIN HCL 500 MG PO TABS: 500.0000 mg | ORAL_TABLET | Freq: Two times a day (BID) | ORAL | Status: DC

## 2015-12-23 NOTE — Progress Notes (Signed)
Patient ID: Rebecca Perkins, female   DOB: 10-Jul-1929, 82 y.o.   MRN: [id number]    Subjective:    Patient ID: Rebecca Perkins, female    DOB: 1929-05-02, 82 y.o.   MRN: [id number]  Patient presents for c/o poss UTI Patient here with UTI symptoms for the past 2 days. Started with some urgency and then she started having frequency with little output on occasion, other times normal amount of urination. She states that it feels like her previous infections. No change in her bowels no fever no chills does not feel ill otherwise. She is diabetic and is followed by endocrinology. Her last uric tract infection was back in October 2016.  Review Of Systems:  GEN- denies fatigue, fever, weight loss,weakness, recent illness HEENT- denies eye drainage, change in vision, nasal discharge, CVS- denies chest pain, palpitations RESP- denies SOB, cough, wheeze ABD- denies N/V, change in stools, abd pain GU- +dysuria, denies hematuria, dribbling, incontinence MSK- denies joint pain, muscle aches, injury Neuro- denies headache, dizziness, syncope, seizure activity       Objective:    BP 146/78 mmHg  Pulse 60  Temp(Src) 98 F (36.7 C) (Oral)  Resp 18  Wt 146 lb (66.225 kg) GEN- NAD, alert and oriented x3, well appearing  HEENT- PERRL, EOMI, non injected sclera, pink conjunctiva, MMM, oropharynx clear CVS- RRR, no murmur RESP-CTAB ABD-NABS,soft,NT,ND, no CVA tenderness EXT- No edema Pulses- Radial  2+        Assessment & Plan:      Problem List Items Addressed This Visit    None    Visit Diagnoses    UTI (lower urinary tract infection)    -  Primary    Recently on keflex, will put on Cipro, culture sent, no other systemic symptoms    Relevant Orders    Urinalysis, Routine w reflex microscopic (not at [facility]) (Completed)    Urine culture    Hx: UTI (urinary tract infection)        Relevant Orders    Urinalysis, Routine w reflex microscopic (not at [facility]) (Completed)    Urine culture        Note: This dictation was prepared with Dragon dictation along with smaller phrase technology. Any transcriptional errors that result from this process are unintentional.

## 2015-12-23 NOTE — Patient Instructions (Signed)
Take antibiotics as prescribed We will call with culture results  F/U as needed

## 2015-12-24 ENCOUNTER — Ambulatory Visit (INDEPENDENT_AMBULATORY_CARE_PROVIDER_SITE_OTHER): Payer: Medicare Other | Admitting: Podiatry

## 2015-12-24 ENCOUNTER — Encounter: Payer: Self-pay | Admitting: Podiatry

## 2015-12-24 VITALS — BP 154/70 | HR 61 | Resp 16

## 2015-12-24 DIAGNOSIS — M2041 Other hammer toe(s) (acquired), right foot: Secondary | ICD-10-CM

## 2015-12-24 DIAGNOSIS — E1149 Type 2 diabetes mellitus with other diabetic neurological complication: Secondary | ICD-10-CM

## 2015-12-24 DIAGNOSIS — E11621 Type 2 diabetes mellitus with foot ulcer: Secondary | ICD-10-CM | POA: Diagnosis not present

## 2015-12-24 DIAGNOSIS — E1140 Type 2 diabetes mellitus with diabetic neuropathy, unspecified: Secondary | ICD-10-CM | POA: Diagnosis not present

## 2015-12-24 DIAGNOSIS — L97509 Non-pressure chronic ulcer of other part of unspecified foot with unspecified severity: Secondary | ICD-10-CM

## 2015-12-24 NOTE — Progress Notes (Signed)
Subjective:     Patient ID: Rebecca Perkins, female   DOB: 1928/09/01, 82 y.o.   MRN: [id number]  HPI patient states my toe seems to be improving and getting gradually better   Review of Systems     Objective:   Physical Exam Neurovascular status intact with distal keratotic lesion digit 2 right that is no longer draining with small amount of scab tissue on the distal tip    Assessment:     Digital deformity second right which appears to be healing with no current erythema in the toe    Plan:     Reviewed with her and caregiver the importance of padding and dispensed silicone pad to lift the toe and utilizing wider-type shoes. We reviewed again the arthroplasty and hammertoe process patient has and will be seen back to recheck

## 2015-12-26 ENCOUNTER — Encounter: Payer: Self-pay | Admitting: Family Medicine

## 2015-12-26 LAB — URINE CULTURE: Colony Count: >=100000

## 2016-01-07 ENCOUNTER — Ambulatory Visit (INDEPENDENT_AMBULATORY_CARE_PROVIDER_SITE_OTHER): Payer: Medicare Other | Admitting: Podiatry

## 2016-01-07 DIAGNOSIS — L97509 Non-pressure chronic ulcer of other part of unspecified foot with unspecified severity: Secondary | ICD-10-CM

## 2016-01-07 DIAGNOSIS — E1149 Type 2 diabetes mellitus with other diabetic neurological complication: Secondary | ICD-10-CM | POA: Diagnosis not present

## 2016-01-07 DIAGNOSIS — M2041 Other hammer toe(s) (acquired), right foot: Secondary | ICD-10-CM

## 2016-01-07 DIAGNOSIS — L84 Corns and callosities: Secondary | ICD-10-CM

## 2016-01-07 DIAGNOSIS — E11621 Type 2 diabetes mellitus with foot ulcer: Secondary | ICD-10-CM

## 2016-01-07 DIAGNOSIS — E1140 Type 2 diabetes mellitus with diabetic neuropathy, unspecified: Secondary | ICD-10-CM

## 2016-01-07 NOTE — Patient Instructions (Signed)

## 2016-01-07 NOTE — Progress Notes (Signed)
Patient ID: Rebecca Perkins, female   DOB: [birth year], 82 y.o.   MRN: [id number] Patient presents for diabetic shoe pick up, shoes are tried on for good fit.  Patient received 1 Pair Apex [id number] Biomedical engineer in women's size 9.5 medium and 3 pairs custom molded diabetic inserts.  Verbal and written break in and wear instructions given.  Patient will follow up for scheduled routine care.

## 2016-01-13 ENCOUNTER — Encounter: Payer: Medicare Other | Attending: Endocrinology | Admitting: Nutrition

## 2016-01-13 ENCOUNTER — Telehealth: Payer: Self-pay | Admitting: Nutrition

## 2016-01-13 DIAGNOSIS — E1165 Type 2 diabetes mellitus with hyperglycemia: Secondary | ICD-10-CM | POA: Diagnosis not present

## 2016-01-13 DIAGNOSIS — E119 Type 2 diabetes mellitus without complications: Secondary | ICD-10-CM

## 2016-01-13 DIAGNOSIS — Z794 Long term (current) use of insulin: Secondary | ICD-10-CM | POA: Diagnosis present

## 2016-01-13 NOTE — Patient Instructions (Addendum)
Take 8u of Humalog when eating out, or eating at a church supper.   Take 8u of Humalog on day when you are drinking regular Coke for lunch. Stop drinking Coke and switch to diet coke, or diet lemonade, or tea sweetened with Splenda Call me when you get home to let me know what the name of the insulin is, that you picked up from the drug store.

## 2016-01-13 NOTE — Telephone Encounter (Signed)
Pt. Reported that she picked up Toujeo insulin from the drug store, but it has no directions. She was told to take the 46u once a day--the same dose as her Lantus.  She was reminded not to take any more Lantus.  She reported good undestanding of this.

## 2016-01-13 NOTE — Telephone Encounter (Signed)
Noted  

## 2016-01-13 NOTE — Progress Notes (Signed)
Patient did not bring meter. Says FBS today was 212, but she ate out last night.  Says they are ususally in the 140s.  One day it was 89. Bfast is at 9AM:  1 piece of bacon, 1 egg, and fruit with black coffee 12:30-1PM: snack of a package of Nabs, and regular Coke-1 can that she sips on all afternoon.  i told there that this will raise her blood sugar to over 300, but she is still wanting to do this.  Suggested she drink other things like diet lemonade, or tea sweetened with Splenda.  She agreed to try these, but would no commit to no Coke. At 6-7PM: she will take 6u of Humalog before supper--2-3 ounces of protein 15-30 grams of carbs, and no veg.   HS snack of 15-20 carbs. At 10PM usually.   She is now taking 46u of Lantus once a day.  Says she has 1 more day of lantus insulin, and then will switch to the new insulin she picked up at the drug store today.  She says it is not Toujeo.  She will call me when she gets home to tell me the name of the insulin.   She also says that her 2-3 hours after supper blood sugar readings are between 190-250.

## 2016-02-04 ENCOUNTER — Other Ambulatory Visit: Payer: Self-pay | Admitting: Family Medicine

## 2016-02-04 NOTE — Telephone Encounter (Signed)
Refill appropriate and filled per protocol. 

## 2016-02-10 ENCOUNTER — Other Ambulatory Visit (INDEPENDENT_AMBULATORY_CARE_PROVIDER_SITE_OTHER): Payer: Medicare Other

## 2016-02-10 DIAGNOSIS — Z794 Long term (current) use of insulin: Secondary | ICD-10-CM

## 2016-02-10 DIAGNOSIS — E041 Nontoxic single thyroid nodule: Secondary | ICD-10-CM | POA: Diagnosis not present

## 2016-02-10 DIAGNOSIS — E1165 Type 2 diabetes mellitus with hyperglycemia: Secondary | ICD-10-CM | POA: Diagnosis not present

## 2016-02-10 LAB — URINALYSIS, ROUTINE W REFLEX MICROSCOPIC
Bilirubin Urine: NEGATIVE
Hgb urine dipstick: NEGATIVE
Ketones, ur: NEGATIVE
Nitrite: NEGATIVE
RBC / HPF: NONE SEEN (ref 0–?)
Specific Gravity, Urine: 1.015 (ref 1.000–1.030)
Total Protein, Urine: NEGATIVE
Urine Glucose: NEGATIVE
Urobilinogen, UA: 0.2 (ref 0.0–1.0)
pH: 5 (ref 5.0–8.0)

## 2016-02-10 LAB — BASIC METABOLIC PANEL
BUN: 31 mg/dL — ABNORMAL HIGH (ref 6–23)
CO2: 26 mEq/L (ref 19–32)
Calcium: 9.9 mg/dL (ref 8.4–10.5)
Chloride: 105 mEq/L (ref 96–112)
Creatinine, Ser: 1.19 mg/dL (ref 0.40–1.20)
GFR: 45.85 mL/min — ABNORMAL LOW (ref >60.00)
Glucose, Bld: 145 mg/dL — ABNORMAL HIGH (ref 70–99)
Potassium: 4.2 mEq/L (ref 3.5–5.1)
Sodium: 138 mEq/L (ref 135–145)

## 2016-02-10 LAB — TSH: TSH: 3.01 u[IU]/mL (ref 0.35–4.50)

## 2016-02-10 LAB — MICROALBUMIN / CREATININE URINE RATIO
Creatinine,U: 117.7 mg/dL
Microalb Creat Ratio: 1 mg/g (ref 0.0–30.0)
Microalb, Ur: 1.2 mg/dL (ref 0.0–1.9)

## 2016-02-11 LAB — FRUCTOSAMINE: Fructosamine: 356 umol/L — ABNORMAL HIGH (ref 0–285)

## 2016-02-12 ENCOUNTER — Encounter: Payer: Self-pay | Admitting: Family Medicine

## 2016-02-13 ENCOUNTER — Encounter: Payer: Self-pay | Admitting: Endocrinology

## 2016-02-13 ENCOUNTER — Ambulatory Visit (INDEPENDENT_AMBULATORY_CARE_PROVIDER_SITE_OTHER): Payer: Medicare Other | Admitting: Endocrinology

## 2016-02-13 VITALS — BP 124/78 | HR 67 | Wt 146.0 lb

## 2016-02-13 DIAGNOSIS — Z794 Long term (current) use of insulin: Secondary | ICD-10-CM

## 2016-02-13 DIAGNOSIS — E1165 Type 2 diabetes mellitus with hyperglycemia: Secondary | ICD-10-CM

## 2016-02-13 NOTE — Progress Notes (Signed)
Patient ID: Rebecca Perkins, female   DOB: 1929-02-23, 82 y.o.   MRN: [id number]            Reason for Appointment: Follow-up for Type 2 Diabetes  Referring physician: Pickard  History of Present Illness:          Diagnosis: Type 2 diabetes mellitus, date of diagnosis: 2006       Past history:  At the time of diagnosis she was feeling tired and weak but does not know what her initial blood sugar was. She probably was tried on metformin initially but because of GI side effects discussed not be continued Also did not tolerate Actos because of nausea. She thinks that she was started on insulin within a few months of her initial diagnosis  Not clear what insulin she was trying in the beginning but she thinks it was NovoLog; however for the last few years has been on Lantus only Her blood sugars have been persistently poorly controlled with A1c usually 8-9% about 2011 At initial consultation she was taking 60 units of Lantus insulin without any mealtime coverage Because of her tendency to low blood sugars including overnight she was told to reduce the dose to 50 units  Recent history:   INSULIN regimen is described as: 46 units Toujeo in a.m., Humalog 6 units at supper   On her visit in 3/17 because of her high A1c she was advised to start taking mealtime insulin at suppertime  She is now also switching to Toujeo instead of Lantus, same doses  Her last A1c was 8.5 and fructosamine is high indicating overall poor control  Current management, blood sugars and problems identified:  She has been an average higher readings compared to her last visit  However most of her high readings are after her evening meals  She has only one or 2 readings at lunch and supper and these are also high  She does not adjust her evening dose based on what she is eating; does periodically eat out.  Her lunch is variable and sometimes she will eat a sandwich otherwise lighter meal  She has been told to  cut back on regular soft drinks also       Oral hypoglycemic drugs the patient is taking are:   none     Side effects from medications have been: Metformin, Actos: nausea, diarrhea    Compliance with the medical regimen: Fair  Hypoglycemia: None  but she feels a little hypoglycemic when blood sugars are around 80  Glucose monitoring:  done 1-2 times a day         Glucometer: One Touch.      Blood Glucose readings    Mean values apply above for all meters except median for One Touch  PRE-MEAL Fasting Lunch Dinner Bedtime Overall  Glucose range: 81-236  190  359  152-303    Mean/median: 160   230 187`     Self-care: The diet that the patient has been following is: tries to limit portions .     Meals: 2-3 meals per day. Breakfast is variable, bacon, fruit egg    OccasionallyAt dinner will have fried seafood, baked potato and hush puppies with dinner otherwise has relatively low amount of starch.   She will snack on peanut butter crackers at lunch along with the regular soft drinks and occasionally Sometimes will and a sandwich at lunch            Exercise: none because of shortness  of breath and fatigue on exertion         Dietician visit, most recent none               Weight history: Highest weight has been 170  Wt Readings from Last 3 Encounters:  02/13/16 146 lb (66.225 kg)  12/23/15 146 lb (66.225 kg)  12/15/15 149 lb 3.2 oz (67.677 kg)    Glycemic control:   Lab Results  Component Value Date   HGBA1C 8.5 12/15/2015   HGBA1C 8.7* 08/22/2015   HGBA1C 7.7* 06/18/2015   Lab Results  Component Value Date   MICROALBUR 1.2 02/10/2016   LDLCALC NOT CALC 11/07/2015   CREATININE 1.19 02/10/2016    Lab on 02/10/2016  Component Date Value Ref Range Status  . Sodium 02/10/2016 138  135 - 145 mEq/L Final  . Potassium 02/10/2016 4.2  3.5 - 5.1 mEq/L Final  . Chloride 02/10/2016 105  96 - 112 mEq/L Final  . CO2 02/10/2016 26  19 - 32 mEq/L Final  . Glucose, Bld 02/10/2016  145* 70 - 99 mg/dL Final  . BUN 91/11/1071 31* 6 - 23 mg/dL Final  . Creatinine, Ser 02/10/2016 1.19  0.40 - 1.20 mg/dL Final  . Calcium 07/13/4409 9.9  8.4 - 10.5 mg/dL Final  . GFR 77/85/4041 45.85* >60.00 mL/min Final  . Fructosamine 02/10/2016 356* 0 - 285 umol/L Final   Comment: Published reference interval for apparently healthy subjects between age 36 and 42 is 75 - 285 umol/L and in a poorly controlled diabetic population is 228 - 563 umol/L with a mean of 396 umol/L.   Marland Kitchen TSH 02/10/2016 3.01  0.35 - 4.50 uIU/mL Final  . Color, Urine 02/10/2016 YELLOW  Yellow;Lt. Yellow Final  . APPearance 02/10/2016 CLEAR  Clear Final  . Specific Gravity, Urine 02/10/2016 1.015  1.000-1.030 Final  . pH 02/10/2016 5.0  5.0 - 8.0 Final  . Total Protein, Urine 02/10/2016 NEGATIVE  Negative Final  . Urine Glucose 02/10/2016 NEGATIVE  Negative Final  . Ketones, ur 02/10/2016 NEGATIVE  Negative Final  . Bilirubin Urine 02/10/2016 NEGATIVE  Negative Final  . Hgb urine dipstick 02/10/2016 NEGATIVE  Negative Final  . Urobilinogen, UA 02/10/2016 0.2  0.0 - 1.0 Final  . Leukocytes, UA 02/10/2016 SMALL* Negative Final  . Nitrite 02/10/2016 NEGATIVE  Negative Final  . WBC, UA 02/10/2016 3-6/hpf* 0-2/hpf Final  . RBC / HPF 02/10/2016 none seen  0-2/hpf Final  . Squamous Epithelial / LPF 02/10/2016 Rare(0-4/hpf)  Rare(0-4/hpf) Final  . Microalb, Ur 02/10/2016 1.2  0.0 - 1.9 mg/dL Final  . Creatinine,U 96/79/4738 117.7   Final  . Microalb Creat Ratio 02/10/2016 1.0  0.0 - 30.0 mg/g Final        Medication List       This list is accurate as of: 02/13/16 11:59 PM.  Always use your most recent med list.               CALTRATE 600+D PO  Take 1 tablet by mouth 2 (two) times daily.     clopidogrel 75 MG tablet  Commonly known as:  PLAVIX  TAKE 1 TABLET BY MOUTH DAILY     ferrous sulfate 325 (65 FE) MG tablet  TAKE 1 TABLET BY MOUTH THREE TIMES DAILY WITH MEALS     furosemide 40 MG tablet    Commonly known as:  LASIX  Take 40 mg by mouth daily.     gemfibrozil 600 MG tablet  Commonly known  as:  LOPID  TAKE 1 TABLET BY MOUTH TWICE DAILY BEFORE A MEAL     glucose blood test strip  1 each by Other route 4 (four) times daily -  before meals and at bedtime. Use as instructed     Insulin Glargine 300 UNIT/ML Sopn  Commonly known as:  TOUJEO SOLOSTAR  Inject 46 Units into the skin daily.     insulin lispro 100 UNIT/ML KiwkPen  Commonly known as:  HUMALOG KWIKPEN  6-10 units at mealtimes     Insulin Pen Needle 31G X 8 MM Misc  Commonly known as:  B-D ULTRAFINE III SHORT PEN  USE AS DIRECTED TO INJECT TOUJEO DAILY     INSULIN SYRINGE 1CC/29G 29G X 1/2" 1 ML Misc     INSULIN SYRINGE 1CC/29G 29G X 1/2" 1 ML Misc  AS DIRECTED.     losartan 100 MG tablet  Commonly known as:  COZAAR  TAKE 1 TABLET BY MOUTH EVERY DAY     metoprolol 50 MG tablet  Commonly known as:  LOPRESSOR  TAKE 1/2 TABLET  BY MOUTH TWICE DAILY     multivitamin tablet  Take 1 tablet by mouth daily.     mupirocin ointment 2 %  Commonly known as:  BACTROBAN  Apply 1 application topically daily.     omega-3 acid ethyl esters 1 g capsule  Commonly known as:  LOVAZA  TAKE 2 CAPSULES BY MOUTH DAILY     omeprazole 20 MG capsule  Commonly known as:  PRILOSEC  TAKE ONE CAPSULE BY MOUTH DAILY     ONE TOUCH ULTRA SYSTEM KIT w/Device Kit  1 kit by Does not apply route once. Tests Blood sugar before meals and at bedtime     potassium chloride SA 20 MEQ tablet  Commonly known as:  K-DUR,KLOR-CON  TAKE 1 TABLET BY MOUTH TWICE DAILY     pravastatin 20 MG tablet  Commonly known as:  PRAVACHOL  Take 0.5 tablets (10 mg total) by mouth daily.     pyridOXINE 100 MG tablet  Commonly known as:  VITAMIN B-6  Take 100 mg by mouth daily.        Allergies:  Allergies  Allergen Reactions  . Lipitor [Atorvastatin Calcium] Diarrhea  . Niacin And Related Hives  . Statins Diarrhea  . Macrobid  [Nitrofurantoin Macrocrystal] Other (See Comments)    unknown    Past Medical History  Diagnosis Date  . Diabetes mellitus   . Hypertension   . Dyslipidemia   . Varicose veins   . Migraines   . GERD (gastroesophageal reflux disease)   . RSD (reflex sympathetic dystrophy) 2007    R wrist/hand following fx  . Anemia   . Osteoporosis   . Stroke ([place])   . PVD (peripheral vascular disease) ([place])     abi .83 (L), .92 (R)    Past Surgical History  Procedure Laterality Date  . Umbilical hernia repair    . Breast cyst excision    . Tubal ligation    . Cholecystectomy  1989  . Appendectomy  1966  . Abdominal hysterectomy  1988  . Several benign cyst removed      last 1 in 1972  . Cataract extraction  10/2009    Family History  Problem Relation Age of Onset  . Stroke Mother 57  . Hypertension Mother   . Clotting disorder Father   . Heart attack Father   . Arrhythmia Sister   . Stroke Brother   .  Diabetes Neg Hx   . Thyroid disease Neg Hx     Social History:  reports that she has never smoked. She has never used smokeless tobacco. She reports that she does not drink alcohol or use illicit drugs.    Review of Systems    She had a TIA and is taking Plavix  Most recent eye exam was In 8/16       Lipids: She is  on pravastatin 20 mg, taking gemfibrozil also but triglycerides tend to be high       Lab Results  Component Value Date   CHOL 197 11/07/2015   HDL 28* 11/07/2015   LDLCALC NOT CALC 11/07/2015   LDLDIRECT 41.8 09/13/2011   TRIG 707* 11/07/2015   CHOLHDL 7.0* 11/07/2015                   Thyroid:    On exam has a 3 cm nodule on the right side but she has refused to consider biopsy  Lab Results  Component Value Date   TSH 3.01 02/10/2016       The blood pressure has been managed with losartan and metoprolol,Well-controlled         She has a history of Numbness in her feet and toes, mostly later in the day but better overnight.  She has had the  symptoms over several months.  Last foot exam was in 2/16 showing sensory loss   Physical Examination:  BP 124/78 mmHg  Pulse 67  Wt 146 lb (66.225 kg)  SpO2 94%  3 cm right thyroid nodule palpable, smooth and uniform, slightly firm.  No lymphadenopathy in the neck No ankle edema    ASSESSMENT /PLAN:  Diabetes type 2, uncontrolled    See history of present illness for detailed discussion of her current management and problems identified as well as blood sugar patterns  Her blood sugars are only under fair control with fructosamine 356 and last A1c  still over 8% Not clear if blood sugars are better controlled with Toujeo compared to Lantus and also not clear why her fasting readings are better in the last few days compared to last month She still has mostly high readings after supper/at bedtime indicating inadequate insulin Also if she is getting more carbohydrate at lunch will probably have high readings, highest 359 at suppertime  Discussed adjusting insulin doses for meals and increasing the dose at suppertime to 8 units of Humalog She will take 6 units at lunch for sandwich As a precaution will reduce her Toujeo by 2 units to avoid potential nocturnal hypoglycemia since she lives alone  THYROID nodule: Clinically stable  Counseling time on subjects discussed above is over 50% of today's 25 minute visit   Patient Instructions  Take 8 units Humalog at supper unless eating light   6 Units at lunch for sandwich  Toujeo 44 daily  Some readings after lunch  Check blood sugars on waking up 4-5  times a week Also check blood sugars about 2 hours after a meal and do this after different meals by rotation  Recommended blood sugar levels on waking up is 90-130 and about 2 hours after meal is 130-160  Please bring your blood sugar monitor to each visit, thank you     [facility] 02/14/2016, 3:13 PM   Note: This office note was prepared with Dragon voice recognition  system technology. Any transcriptional errors that result from this process are unintentional.

## 2016-02-13 NOTE — Patient Instructions (Addendum)
Take 8 units Humalog at supper unless eating light   6 Units at lunch for sandwich  Toujeo 44 daily  Some readings after lunch  Check blood sugars on waking up 4-5  times a week Also check blood sugars about 2 hours after a meal and do this after different meals by rotation  Recommended blood sugar levels on waking up is 90-130 and about 2 hours after meal is 130-160  Please bring your blood sugar monitor to each visit, thank you

## 2016-02-24 ENCOUNTER — Telehealth: Payer: Self-pay | Admitting: Endocrinology

## 2016-02-24 NOTE — Telephone Encounter (Signed)
Patient took her Humalog  in the morning time and was suppose to take it at night, she want to know wilit affect her.

## 2016-02-24 NOTE — Telephone Encounter (Signed)
Her blood sugar will be lower within 4 hours with taking Humalog..  If she has a low blood sugar episode she can drink 6 ounces of regular soft drinks or juice.  Also she needs to let us know if she has taken Humalog when she is eating a sandwich at lunchtime as directed

## 2016-02-25 NOTE — Telephone Encounter (Signed)
Tried to call patient and advise of the low sugar and what to do, and check to make sure patient was taking humalog as directed. Line was busy. Will try again later.

## 2016-02-28 ENCOUNTER — Other Ambulatory Visit: Payer: Self-pay | Admitting: Family Medicine

## 2016-03-01 ENCOUNTER — Ambulatory Visit (INDEPENDENT_AMBULATORY_CARE_PROVIDER_SITE_OTHER): Payer: Medicare Other | Admitting: Nurse Practitioner

## 2016-03-01 ENCOUNTER — Telehealth: Payer: Self-pay | Admitting: Nurse Practitioner

## 2016-03-01 ENCOUNTER — Encounter: Payer: Self-pay | Admitting: Nurse Practitioner

## 2016-03-01 VITALS — BP 122/56 | HR 63 | Ht 64.0 in | Wt 150.0 lb

## 2016-03-01 DIAGNOSIS — E785 Hyperlipidemia, unspecified: Secondary | ICD-10-CM

## 2016-03-01 DIAGNOSIS — G451 Carotid artery syndrome (hemispheric): Secondary | ICD-10-CM | POA: Diagnosis not present

## 2016-03-01 DIAGNOSIS — I10 Essential (primary) hypertension: Secondary | ICD-10-CM | POA: Diagnosis not present

## 2016-03-01 NOTE — Progress Notes (Signed)
[facility]  PATIENT: Rebecca Perkins DOB: [birth year]   REASON FOR VISIT: Follow-up for  TIA in November 2016 [place] ILLNESS:UPDATE 07/31/2017CM Rebecca Perkins, 82 year old female returns for follow-up. She has a history of hospital admission for TIA in November 2016. She was unable to speak or get her words out for about 15 minutes and then her symptoms resolved she was started on Plavix. Most recent hemoglobin A1c 8.5 managed by Dr. Dwyane Dee. Lipid profile in April with triglycerides 707 unable to calculate a LDL. She was started back on gemfibrozil. Her diabetes and cholesterol have  remained poorly controlled. She has refused take statin drugs. She walks to the mailbox for exercise. Otherwise she gets little exercise she returns for reevaluation  HISTORY 09/01/15 PS86 year Caucasian lady seen today for first office follow-up visit following hospital admission for TIA in November 2016.Rebecca Perkins is an 82 y.o. female with a 15 min episode of unable to speak the correct words out, winessed by her family. No other focal neuro symptoms at that time. Sx resolved in 10-15 min.Patient was not administered TPA secondary to resolved deficits. She was admitted for further evaluation and treatment. CT scan of the head on admission showed only chronic microvascular disease and MRI scan showed no acute infarct. Old lacunar infarcts noted in the left basal ganglia and thalamus. MRA of the brain showed no large vessel occlusion or stenosis. Carotid ultrasound showed no significant extracranial stenosis. Transthoracic echo showed ejection fraction of 50-55% without cardiac source of embolism. LDL cholesterol was 81 mg percent. Last hemoglobin A1c on 08/22/15 was 8.7. While lipid profile showed total cholesterol 208, triglycerides 372 HDL 32 and LDL 102 mg percent on 08/22/15. She was started on Plavix which is tolerating well with only minor bruising. She  however has not yet started protocol during she has filled the prescription and plans to do so. She does have a prior history of statin myalgias.  REVIEW OF SYSTEMS: Full 14 system review of systems performed and notable only for those listed, all others are neg:  Constitutional: neg  Cardiovascular: neg Ear/Nose/Throat: neg  Skin: neg Eyes: neg Respiratory: neg Gastroitestinal: neg  Hematology/Lymphatic: neg  Endocrine: neg Musculoskeletal:neg Allergy/Immunology: neg Neurological: neg Psychiatric: neg Sleep : neg   ALLERGIES: Allergies  Allergen Reactions  . Lipitor [Atorvastatin Calcium] Diarrhea  . Niacin And Related Hives  . Statins Diarrhea  . Macrobid [Nitrofurantoin Macrocrystal] Other (See Comments)    unknown    HOME MEDICATIONS: Outpatient Medications Prior to Visit  Medication Sig Dispense Refill  . Blood Glucose Monitoring Suppl (ONE TOUCH ULTRA SYSTEM KIT) W/DEVICE KIT 1 kit by Does not apply route once. Tests Blood sugar before meals and at bedtime 1 each 0  . Calcium Carbonate-Vitamin D (CALTRATE 600+D PO) Take 1 tablet by mouth 2 (two) times daily.    . clopidogrel (PLAVIX) 75 MG tablet TAKE 1 TABLET BY MOUTH DAILY 30 tablet 3  . ferrous sulfate 325 (65 FE) MG tablet TAKE 1 TABLET BY MOUTH THREE TIMES DAILY WITH MEALS 90 tablet 4  . furosemide (LASIX) 40 MG tablet Take 40 mg by mouth daily.     Marland Kitchen gemfibrozil (LOPID) 600 MG tablet TAKE 1 TABLET BY MOUTH TWICE DAILY BEFORE A MEAL 60 tablet 0  . glucose blood test strip 1 each by Other route 4 (four) times daily -  before meals and at bedtime. Use as instructed 150 each 12  .  Insulin Glargine (TOUJEO SOLOSTAR) 300 UNIT/ML SOPN Inject 46 Units into the skin daily. 9 mL 3  . insulin lispro (HUMALOG KWIKPEN) 100 UNIT/ML KiwkPen 6-10 units at mealtimes 15 mL 1  . Insulin Pen Needle (B-D ULTRAFINE III SHORT PEN) 31G X 8 MM MISC USE AS DIRECTED TO INJECT TOUJEO DAILY 100 each 5  . INSULIN SYRINGE 1CC/29G 29G X 1/2" 1  ML MISC   6  . INSULIN SYRINGE 1CC/29G 29G X 1/2" 1 ML MISC AS DIRECTED. 100 each 3  . losartan (COZAAR) 100 MG tablet TAKE 1 TABLET BY MOUTH EVERY DAY 30 tablet 3  . metoprolol (LOPRESSOR) 50 MG tablet TAKE 1/2 TABLET  BY MOUTH TWICE DAILY 180 tablet 0  . Multiple Vitamin (MULTIVITAMIN) tablet Take 1 tablet by mouth daily.      . mupirocin ointment (BACTROBAN) 2 % Apply 1 application topically daily.    Marland Kitchen omega-3 acid ethyl esters (LOVAZA) 1 g capsule TAKE 2 CAPSULES BY MOUTH DAILY 60 capsule 11  . omeprazole (PRILOSEC) 20 MG capsule TAKE ONE CAPSULE BY MOUTH DAILY 30 capsule 0  . potassium chloride SA (K-DUR,KLOR-CON) 20 MEQ tablet TAKE 1 TABLET BY MOUTH TWICE DAILY 180 tablet 0  . pravastatin (PRAVACHOL) 20 MG tablet Take 0.5 tablets (10 mg total) by mouth daily. 30 tablet 5  . Pyridoxine HCl (VITAMIN B-6) 100 MG tablet Take 100 mg by mouth daily.       No facility-administered medications prior to visit.     PAST MEDICAL HISTORY: Past Medical History:  Diagnosis Date  . Anemia   . Diabetes mellitus   . Dyslipidemia   . GERD (gastroesophageal reflux disease)   . Hypertension   . Migraines   . Osteoporosis   . PVD (peripheral vascular disease) ([place])    abi .83 (L), .92 (R)  . RSD (reflex sympathetic dystrophy) 2007   R wrist/hand following fx  . Stroke ([place])   . Varicose veins     PAST SURGICAL HISTORY: Past Surgical History:  Procedure Laterality Date  . ABDOMINAL HYSTERECTOMY  1988  . APPENDECTOMY  1966  . BREAST CYST EXCISION    . CATARACT EXTRACTION  10/2009  . CHOLECYSTECTOMY  1989  . Several benign cyst removed     last 1 in 1972  . TUBAL LIGATION    . UMBILICAL HERNIA REPAIR      FAMILY HISTORY: Family History  Problem Relation Age of Onset  . Stroke Mother 56  . Hypertension Mother   . Clotting disorder Father   . Heart attack Father   . Arrhythmia Sister   . Stroke Brother   . Diabetes Neg Hx   . Thyroid disease Neg Hx     SOCIAL HISTORY: Social  History   Social History  . Marital status: Widowed    Spouse name: N/A  . Number of children: 3  . Years of education: N/A   Occupational History  . Retired    Social History Main Topics  . Smoking status: Never Smoker  . Smokeless tobacco: Never Used  . Alcohol use No  . Drug use: No  . Sexual activity: Not on file   Other Topics Concern  . 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 General: Frail elderly Caucasian lady, seated, in no evident distress Head: head normocephalic and atraumatic.  Neck: supple with no carotid  bruits Cardiovascular: regular rate and rhythm, no murmurs Musculoskeletal: no deformity Skin:  no rash/petichiae Vascular:  Normal pulses all extremities  Neurological examination  Mental Status: Awake and fully alert. Oriented to place and time.  Attention span, concentration and fund of knowledge appropriate. Mood and affect appropriate.  Cranial Nerves: Pupils equal, briskly reactive to light. Extraocular movements full without nystagmus. Visual fields full to confrontation. Hearing intact. Facial sensation intact. Face, tongue, palate moves normally and symmetrically.  Motor: Normal bulk and tone. Normal strength in all tested extremity muscles. Sensory.: intact to touch ,pinprick .position and vibratory sensation.  Coordination: Rapid alternating movements normal in all extremities. Finger-to-nose and heel-to-shin performed accurately bilaterally. Gait and Station: Arises from chair without difficulty. Stance is normal. Gait demonstrates normal stride length and balance . Unable to heel, toe and tandem walk without difficulty. No assistive device Reflexes: 1+ and symmetric. Toes downgoing.    DIAGNOSTIC DATA (LABS, IMAGING, TESTING) - I reviewed patient records, labs, notes, testing and imaging myself where available.  Lab Results  Component Value Date   WBC 8.5 10/02/2015   HGB 11.9 (L) 10/02/2015   HCT 36.1 10/02/2015   MCV 85.1 10/02/2015   PLT 178 10/02/2015      Component Value Date/Time   NA 138 02/10/2016 0922   K 4.2 02/10/2016 0922   CL 105 02/10/2016 0922   CO2 26 02/10/2016 0922   GLUCOSE 145 (H) 02/10/2016 0922   BUN 31 (H) 02/10/2016 0922   CREATININE 1.19 02/10/2016 0922   CREATININE 0.86 11/07/2015 1152   CALCIUM 9.9 02/10/2016 0922   PROT 6.5 11/07/2015 1152   ALBUMIN 3.7 11/07/2015 1152   AST 16 11/07/2015 1152   ALT 14 11/07/2015 1152   ALKPHOS 73 11/07/2015 1152   BILITOT 0.5 11/07/2015 1152   GFRNONAA 62 11/07/2015 1152   GFRAA 72 11/07/2015 1152   Lab Results  Component Value Date   CHOL 197 11/07/2015   HDL 28 (L) 11/07/2015   LDLCALC NOT CALC 11/07/2015   LDLDIRECT 41.8 09/13/2011   TRIG 707 (H) 11/07/2015   CHOLHDL 7.0 (H) 11/07/2015   Lab Results  Component Value Date   HGBA1C 8.5 12/15/2015   Lab Results  Component Value Date   VITAMINB12 1,072 (H) 12/10/2013   Lab Results  Component Value Date   TSH 3.01 02/10/2016      ASSESSMENT AND PLAN 46 year lady with left hemispheric TIA in November 2016 likely from small vessel disease with vascular risk factors of hypertension, hyperlipidemia, diabetes and age.  Continue Plavix for  secondary stroke prevention  Maintain strict control of hypertension with blood pressure goal below 130/90, Today's reading 122/56 diabetes with hemoglobin A1c goal below 6.5% Most recent 8.5 on 12/15/15 Continue diabetic medications Lipids with LDL cholesterol goal below 70 mg/dL. Continue gemfibrozil. Patient has refused statins I advised the patient to continue taking coenzyme for statin related myalgias Eat a healthy diet with plenty of whole grains, cereals, fruits and vegetables,  exercise regularly and maintain ideal body Follow-up in 6 months Dennie Bible, [facility], [facility], APRN  [facility] [address] [place], [place] [address] [phone]

## 2016-03-01 NOTE — Patient Instructions (Addendum)
Continue Plavix  for  secondary stroke prevention  Maintain strict control of hypertension with blood pressure goal below 130/90, Today's reading 122/56 diabetes with hemoglobin A1c goal below 6.5% Most recent 8.5 on 12/15/15 Continue diabetic medications Lipids with LDL cholesterol goal below 70 mg/dL. Recent triglycerides 707 Continue gemfibrozil I advised the patient to continue  taking coenzyme  10 to help with statin related myalgias Eat a healthy diet with plenty of whole grains, cereals, fruits and vegetables, exercise regularly and maintain ideal body Follow-up in 6 months

## 2016-03-01 NOTE — Telephone Encounter (Signed)
Refill appropriate and filled per protocol. 

## 2016-03-01 NOTE — Telephone Encounter (Signed)
I called the patient left message. Patient was on Plavix only when last seen by Dr. Leonie Man. Who put her on aspirin that was reported today please call back to clarify.

## 2016-03-02 ENCOUNTER — Telehealth: Payer: Self-pay

## 2016-03-02 ENCOUNTER — Other Ambulatory Visit: Payer: Self-pay | Admitting: Family Medicine

## 2016-03-02 NOTE — Telephone Encounter (Signed)
Attempted to contact patient again regarding sugars, no answer and no voicemail to leave message. Will keep trying to contact patient to advise.

## 2016-03-02 NOTE — Telephone Encounter (Signed)
Patient is returning a call. °

## 2016-03-02 NOTE — Addendum Note (Signed)
Addended byOliver Hum on: 03/02/2016 10:17 AM   Modules accepted: Orders

## 2016-03-02 NOTE — Telephone Encounter (Signed)
I spoke to pt.   She has been taking 81 aspirin for yrs when Dr. Mare Ferrari put her on it. She now see's Dr. Dennard Schaumann in [place].  Pt states that she remembers some talk about aspirin, but was not told to stop.  Looking on 06/2015 discharge note when had TIA, it does say to stop aspirin and take plavix.   I told pt this and to stop taking the aspirin but continue with plavix.   She verbalized understanding. If any changes would call her back.

## 2016-03-05 ENCOUNTER — Other Ambulatory Visit: Payer: Self-pay | Admitting: Family Medicine

## 2016-03-05 NOTE — Telephone Encounter (Signed)
Refill appropriate and filled per protocol. 

## 2016-03-08 ENCOUNTER — Other Ambulatory Visit: Payer: Self-pay | Admitting: Family Medicine

## 2016-03-09 NOTE — Progress Notes (Signed)
I agree with the above plan 

## 2016-03-09 NOTE — Telephone Encounter (Signed)
Refill appropriate and filled per protocol. 

## 2016-03-26 LAB — HM DIABETES EYE EXAM

## 2016-03-29 ENCOUNTER — Other Ambulatory Visit: Payer: Self-pay | Admitting: Family Medicine

## 2016-04-04 ENCOUNTER — Other Ambulatory Visit: Payer: Self-pay | Admitting: Family Medicine

## 2016-04-08 ENCOUNTER — Encounter: Payer: Self-pay | Admitting: *Deleted

## 2016-04-15 ENCOUNTER — Encounter: Payer: Self-pay | Admitting: Endocrinology

## 2016-04-15 ENCOUNTER — Ambulatory Visit (INDEPENDENT_AMBULATORY_CARE_PROVIDER_SITE_OTHER): Payer: Medicare Other | Admitting: Endocrinology

## 2016-04-15 VITALS — BP 124/64 | HR 78 | Temp 98.1°F | Resp 16 | Ht 64.0 in | Wt 146.8 lb

## 2016-04-15 DIAGNOSIS — Z794 Long term (current) use of insulin: Secondary | ICD-10-CM

## 2016-04-15 DIAGNOSIS — E1142 Type 2 diabetes mellitus with diabetic polyneuropathy: Secondary | ICD-10-CM

## 2016-04-15 DIAGNOSIS — E1165 Type 2 diabetes mellitus with hyperglycemia: Secondary | ICD-10-CM | POA: Diagnosis not present

## 2016-04-15 DIAGNOSIS — E119 Type 2 diabetes mellitus without complications: Secondary | ICD-10-CM

## 2016-04-15 LAB — POCT GLUCOSE (DEVICE FOR HOME USE): POC Glucose: 266 mg/dl — AB (ref 70–99)

## 2016-04-15 LAB — POCT GLYCOSYLATED HEMOGLOBIN (HGB A1C): Hemoglobin A1C: 8.1

## 2016-04-15 NOTE — Progress Notes (Signed)
Patient ID: Rebecca Perkins, female   DOB: 1928-12-30, 82 y.o.   MRN: [id number]            Reason for Appointment: Follow-up for Type 2 Diabetes  Referring physician: Pickard  History of Present Illness:          Diagnosis: Type 2 diabetes mellitus, date of diagnosis: 2006       Past history:  At the time of diagnosis she was feeling tired and weak but does not know what her initial blood sugar was. She probably was tried on metformin initially but because of GI side effects discussed not be continued Also did not tolerate Actos because of nausea. She thinks that she was started on insulin within a few months of her initial diagnosis  Not clear what insulin she was trying in the beginning but she thinks it was NovoLog; however for the last few years has been on Lantus only Her blood sugars have been persistently poorly controlled with A1c usually 8-9% about 2011 At initial consultation she was taking 60 units of Lantus insulin without any mealtime coverage Because of her tendency to low blood sugars including overnight she was told to reduce the dose to 50 units  Recent history:   INSULIN regimen is described as: 44 units Toujeo in a.m., Humalog 6 units at supper  On her visit in 3/17 because of her high A1c she was advised to start taking mealtime insulin at suppertime  She is now also switching to Toujeo instead of Lantus , same doses  Her last A1c was 8.5 and it is now better at 8.1  Current management, blood sugars and problems identified:  She has been again checking most of her blood sugars in the mornings and rarely in the evenings as discussed on previous visits.  Even when her blood sugars are high in the morning she does not take any rapid acting insulin.  She was told to take Humalog at lunch on the last visit but she does not do so, also generally eating at least one slice of bread at lunchtime.  More recently has not checked in the evenings for suppertime  She  has about 5 readings in the evenings recently and only twice these are below 175  Not clear why her blood sugars are quite variable in the mornings and over 200 occasionally including today without any excessive high-fat intake or snacks last evening  She is afraid of hypoglycemia in the early morning and she thinks she feels a little weaker when her blood sugars are in the 80s and 90s which is happening only twice in the last month.  This is despite reviewing her Toujeo by 2 units on the last visit.  She thinks her portions are small and has lost a little weight  She has been told to cut back on regular soft drinks        Oral hypoglycemic drugs the patient is taking are:   none     Side effects from medications have been: Metformin, Actos: nausea, diarrhea    Compliance with the medical regimen: Fair  Hypoglycemia: None  but she feels a little hypoglycemic when blood sugars are around 80  Glucose monitoring:  done 1-2 times a day         Glucometer: One Touch.      Blood Glucose readings    Mean values apply above for all meters except median for One Touch  PRE-MEAL Fasting Lunch Dinner Bedtime Overall  Glucose range: 86-241   166-289   Mean/median:     159    Self-care: The diet that the patient has been following is: tries to limit portions .     Meals: 2-3 meals per day. Breakfast is variable, bacon, fruit, egg    Dinner 7 pm  Occasionally At dinner will have fried seafood, baked potato and hush puppies with dinner otherwise has relatively low amount of starch.   She will snack on peanut butter crackers at lunch along with the regular soft drinks and occasionally Sometimes will and a sandwich at lunch            Exercise: a little because of shortness of breath and fatigue on exertion         Dietician visit, most recent none               Weight history: Highest weight has been 170  Wt Readings from Last 3 Encounters:  04/15/16 146 lb 12.8 oz (66.6 kg)  03/01/16 150  lb (68 kg)  02/13/16 146 lb (66.2 kg)    Glycemic control:   Lab Results  Component Value Date   HGBA1C 8.1 04/15/2016   HGBA1C 8.5 12/15/2015   HGBA1C 8.7 (H) 08/22/2015   Lab Results  Component Value Date   MICROALBUR 1.2 02/10/2016   LDLCALC NOT CALC 11/07/2015   CREATININE 1.19 02/10/2016    Office Visit on 04/15/2016  Component Date Value Ref Range Status  . Hemoglobin A1C 04/15/2016 8.1   Final  . POC Glucose 04/15/2016 266* 70 - 99 mg/dl Final        Medication List       Accurate as of 04/15/16  9:01 PM. Always use your most recent med list.          CALTRATE 600+D PO Take 1 tablet by mouth 2 (two) times daily.   clopidogrel 75 MG tablet Commonly known as:  PLAVIX TAKE 1 TABLET BY MOUTH DAILY   co-enzyme Q-10 30 MG capsule Take 100 mg by mouth 2 (two) times daily.   ferrous sulfate 325 (65 FE) MG tablet TAKE 1 TABLET BY MOUTH THREE TIMES DAILY WITH MEALS   furosemide 40 MG tablet Commonly known as:  LASIX Take 40 mg by mouth daily.   gemfibrozil 600 MG tablet Commonly known as:  LOPID TAKE 1 TABLET BY MOUTH TWICE DAILY BEFORE A MEAL   Insulin Glargine 300 UNIT/ML Sopn Commonly known as:  TOUJEO SOLOSTAR Inject 46 Units into the skin daily.   insulin lispro 100 UNIT/ML KiwkPen Commonly known as:  HUMALOG KWIKPEN 6-10 units at mealtimes   Insulin Pen Needle 31G X 8 MM Misc Commonly known as:  B-D ULTRAFINE III SHORT PEN USE AS DIRECTED TO INJECT TOUJEO DAILY   INSULIN SYRINGE 1CC/29G 29G X 1/2" 1 ML Misc   INSULIN SYRINGE 1CC/29G 29G X 1/2" 1 ML Misc AS DIRECTED.   losartan 100 MG tablet Commonly known as:  COZAAR TAKE 1 TABLET BY MOUTH EVERY DAY   metoprolol 50 MG tablet Commonly known as:  LOPRESSOR TAKE 1/2 TABLET  BY MOUTH TWICE DAILY   metoprolol 50 MG tablet Commonly known as:  LOPRESSOR TAKE 1 TABLET BY MOUTH TWICE DAILY   multivitamin tablet Take 1 tablet by mouth daily.   omega-3 acid ethyl esters 1 g  capsule Commonly known as:  LOVAZA TAKE 2 CAPSULES BY MOUTH DAILY   omeprazole 20 MG capsule Commonly known as:  PRILOSEC TAKE ONE CAPSULE  BY MOUTH DAILY   ONE TOUCH ULTRA SYSTEM KIT w/Device Kit 1 kit by Does not apply route once. Tests Blood sugar before meals and at bedtime   ONE TOUCH ULTRA TEST test strip Generic drug:  glucose blood TEST BLODD SUAGR FOUR TIMES DAILY BEFORE MEALS AND AT BEDTIME USE AS DIRECTED   potassium chloride SA 20 MEQ tablet Commonly known as:  K-DUR,KLOR-CON TAKE 1 TABLET BY MOUTH TWICE DAILY   pyridOXINE 100 MG tablet Commonly known as:  VITAMIN B-6 Take 100 mg by mouth daily.       Allergies:  Allergies  Allergen Reactions  . Lipitor [Atorvastatin Calcium] Diarrhea  . Niacin And Related Hives  . Statins Diarrhea  . Macrobid [Nitrofurantoin Macrocrystal] Other (See Comments)    unknown    Past Medical History:  Diagnosis Date  . Anemia   . Diabetes mellitus   . Dyslipidemia   . GERD (gastroesophageal reflux disease)   . Hypertension   . Migraines   . Osteoporosis   . PVD (peripheral vascular disease) ([place])    abi .83 (L), .92 (R)  . RSD (reflex sympathetic dystrophy) 2007   R wrist/hand following fx  . Stroke ([place])   . Varicose veins     Past Surgical History:  Procedure Laterality Date  . ABDOMINAL HYSTERECTOMY  1988  . APPENDECTOMY  1966  . BREAST CYST EXCISION    . CATARACT EXTRACTION  10/2009  . CHOLECYSTECTOMY  1989  . Several benign cyst removed     last 1 in 1972  . TUBAL LIGATION    . UMBILICAL HERNIA REPAIR      Family History  Problem Relation Age of Onset  . Stroke Mother 80  . Hypertension Mother   . Clotting disorder Father   . Heart attack Father   . Arrhythmia Sister   . Stroke Brother   . Diabetes Neg Hx   . Thyroid disease Neg Hx     Social History:  reports that she has never smoked. She has never used smokeless tobacco. She reports that she does not drink alcohol or use drugs.    Review of  Systems    She has had a TIA and is taking Plavix  Most recent eye exam was In 8/16       Lipids: She is  on pravastatin 20 mg, taking gemfibrozil also but triglycerides tend to be high, followed by cardiologist       Lab Results  Component Value Date   CHOL 197 11/07/2015   HDL 28 (L) 11/07/2015   LDLCALC NOT CALC 11/07/2015   LDLDIRECT 41.8 09/13/2011   TRIG 707 (H) 11/07/2015   CHOLHDL 7.0 (H) 11/07/2015                   Thyroid:    On exam has a 3 cm nodule on the right side but she has refused to consider biopsy  Lab Results  Component Value Date   TSH 3.01 02/10/2016       The blood pressure has been managed with losartan and metoprolol,Well-controlled         She has a history of Numbness in her feet and toes, mostly later in the day but better overnight.  She has had the symptoms over several months.  Last foot exam was in 2/16 showing sensory loss   Physical Examination:  BP 124/64   Pulse 78   Temp 98.1 F (36.7 C)   Resp 16  Ht '5\' 4"'$  (1.626 m)   Wt 146 lb 12.8 oz (66.6 kg)   SpO2 98%   BMI 25.20 kg/m     Diabetic Foot Exam - Simple   Simple Foot Form Diabetic Foot exam was performed with the following findings:  Yes 04/15/2016 11:20 AM  Visual Inspection No deformities, no ulcerations, no other skin breakdown bilaterally:  Yes Sensation Testing See comments:  Yes Pulse Check Posterior Tibialis and Dorsalis pulse intact bilaterally:  Yes Comments Slight decrease in monofilament sensation distal toes        ASSESSMENT /PLAN:  Diabetes type 2, uncontrolled    See history of present illness for detailed discussion of her current management and problems identified as well as blood sugar patterns  Her A1c is slightly better 8.1 which may be adequate for her age. As discussed above she does need to check readings after meals to help adjust and determine the need for the meal Humalog insulin. Most likely she is needing more Humalog at  suppertime than 6 units but also may need some for covering lunch when she has a sandwich Since she does not always have carbohydrate at breakfast may not need mealtime insulin but can also take extra Humalog when blood sugars are significantly high fasting She does not need to check her blood sugar every morning as this will not determine any change in her management and can do it every other day or so. Reminded her to be careful about selecting her insulin that she had one episode where she took Humalog instead of Toujeo. Again encouraged her to cut back on regular soft drinks Also discussed checking more readings after various meals  Reminded her to check her feet on the bottom and distal toes daily  Patient Instructions  Check blood sugars on waking up  3x weekly  Also check blood sugars about 2 hours after a meal and do this after different meals by rotation  Recommended blood sugar levels on waking up is 90-130 and about 2 hours after meal is 130-160  Please bring your blood sugar monitor to each visit, thank you  For more starchy foods at supper take 8 Units Humalog  If sugar >200 in am take 4 Humalog      Counseling time on subjects discussed above is over 50% of today's 25 minute visit   Rebecca Perkins 04/15/2016, 9:01 PM   Note: This office note was prepared with Estate agent. Any transcriptional errors that result from this process are unintentional.

## 2016-04-15 NOTE — Patient Instructions (Addendum)
Check blood sugars on waking up  3x weekly  Also check blood sugars about 2 hours after a meal and do this after different meals by rotation  Recommended blood sugar levels on waking up is 90-130 and about 2 hours after meal is 130-160  Please bring your blood sugar monitor to each visit, thank you  For more starchy foods at supper take 8 Units Humalog  If sugar >200 in am take 4 Humalog

## 2016-04-23 ENCOUNTER — Telehealth: Payer: Self-pay | Admitting: Endocrinology

## 2016-04-23 NOTE — Telephone Encounter (Signed)
Rebecca Perkins from [facility] need patient last b/p and A1C [phone]  Opt 1 ex [phone]

## 2016-04-30 ENCOUNTER — Other Ambulatory Visit: Payer: Self-pay | Admitting: Family Medicine

## 2016-05-04 ENCOUNTER — Ambulatory Visit (INDEPENDENT_AMBULATORY_CARE_PROVIDER_SITE_OTHER): Payer: Medicare Other | Admitting: Podiatry

## 2016-05-04 ENCOUNTER — Encounter: Payer: Self-pay | Admitting: Podiatry

## 2016-05-04 DIAGNOSIS — B351 Tinea unguium: Secondary | ICD-10-CM

## 2016-05-04 DIAGNOSIS — M79676 Pain in unspecified toe(s): Secondary | ICD-10-CM

## 2016-05-04 NOTE — Progress Notes (Addendum)
Complaint:  Visit Type: Patient returns to my office for continued preventative foot care services. Complaint: Patient states" my nails have grown long and thick and become painful to walk and wear shoes" Patient has been diagnosed with DM with vascular disease. The patient presents for preventative foot care services. No changes to ROS.  She says she was examined at the vascular lab.  Podiatric Exam: Vascular: dorsalis pedis and posterior tibial pulses are not  palpable bilateral. Capillary return is delayed . Skin turgor WNL  Sensorium: Absent  Semmes Weinstein monofilament test to the toes. Normal tactile sensation bilaterally. Nail Exam: Pt has thick disfigured discolored nails with subungual debris noted bilateral entire nail hallux through fifth toenails Ulcer Exam: There is no evidence of ulcer or pre-ulcerative changes or infection. Orthopedic Exam: Muscle tone and strength are WNL. No limitations in general ROM. No crepitus or effusions noted. Foot type and digits show no abnormalities.  Hammer toes B/L Skin: No Porokeratosis. No infection or ulcers  Diagnosis:  Onychomycosis, , Pain in right toe, pain in left toes  Treatment & Plan Procedures and Treatment: Consent by patient was obtained for treatment procedures. The patient understood the discussion of treatment and procedures well. All questions were answered thoroughly reviewed. Debridement of mycotic and hypertrophic toenails, 1 through 5 bilateral and clearing of subungual debris. No ulceration, no infection noted.  Return Visit-Office Procedure: Patient instructed to return to the office for a follow up visit 3 months for continued evaluation and treatment.    Gardiner Barefoot DPM

## 2016-05-07 ENCOUNTER — Ambulatory Visit (INDEPENDENT_AMBULATORY_CARE_PROVIDER_SITE_OTHER): Payer: Medicare Other | Admitting: Family Medicine

## 2016-05-07 ENCOUNTER — Encounter: Payer: Self-pay | Admitting: Family Medicine

## 2016-05-07 VITALS — BP 120/70 | HR 68 | Temp 98.1°F | Resp 16 | Ht 64.0 in | Wt 145.0 lb

## 2016-05-07 DIAGNOSIS — B372 Candidiasis of skin and nail: Secondary | ICD-10-CM | POA: Diagnosis not present

## 2016-05-07 DIAGNOSIS — G8929 Other chronic pain: Secondary | ICD-10-CM

## 2016-05-07 DIAGNOSIS — M25511 Pain in right shoulder: Secondary | ICD-10-CM

## 2016-05-07 MED ORDER — CLOTRIMAZOLE-BETAMETHASONE 1-0.05 % EX CREA: 1.0000 "application " | TOPICAL_CREAM | Freq: Two times a day (BID) | CUTANEOUS | 0 refills | Status: DC

## 2016-05-07 NOTE — Progress Notes (Signed)
Subjective:    Patient ID: Rebecca Perkins, female    DOB: 28-Sep-1928, 82 y.o.   MRN: [id number]  HPI  The last 6 months, patient has had increasing pain in the right shoulder. She is unable to abduct her greater than 80. She has significant pain in the shoulder joint with abduction. She has pain with internal and external rotation. She has significant weakness with abduction. I can easily push her arm down. She is barely able to support her arm against gravity. She also has a positive Hawkins sign. Because she is been unable to lift her arm she has developed a red rash in her arm. With a sharp serpiginous border and fine scale that looks consistent with Candida intertrigo. She denies any injuries. She is uninterested in any surgery Past Medical History:  Diagnosis Date  . Anemia   . Diabetes mellitus   . Dyslipidemia   . GERD (gastroesophageal reflux disease)   . Hypertension   . Migraines   . Osteoporosis   . PVD (peripheral vascular disease) ([place])    abi .83 (L), .92 (R)  . RSD (reflex sympathetic dystrophy) 2007   R wrist/hand following fx  . Stroke ([place])   . Varicose veins    Past Surgical History:  Procedure Laterality Date  . ABDOMINAL HYSTERECTOMY  1988  . APPENDECTOMY  1966  . BREAST CYST EXCISION    . CATARACT EXTRACTION  10/2009  . CHOLECYSTECTOMY  1989  . Several benign cyst removed     last 1 in 1972  . TUBAL LIGATION    . UMBILICAL HERNIA REPAIR     Current Outpatient Prescriptions on File Prior to Visit  Medication Sig Dispense Refill  . Blood Glucose Monitoring Suppl (ONE TOUCH ULTRA SYSTEM KIT) W/DEVICE KIT 1 kit by Does not apply route once. Tests Blood sugar before meals and at bedtime 1 each 0  . Calcium Carbonate-Vitamin D (CALTRATE 600+D PO) Take 1 tablet by mouth 2 (two) times daily.    . clopidogrel (PLAVIX) 75 MG tablet TAKE 1 TABLET BY MOUTH DAILY 30 tablet 11  . co-enzyme Q-10 30 MG capsule Take 100 mg by mouth 2 (two) times daily.    . ferrous  sulfate 325 (65 FE) MG tablet TAKE 1 TABLET BY MOUTH THREE TIMES DAILY WITH MEALS (Patient taking differently: TAKE 1 TABLET BY MOUTH TWO TIMES DAILY WITH MEALS) 90 tablet 4  . furosemide (LASIX) 40 MG tablet Take 40 mg by mouth daily.     Marland Kitchen gemfibrozil (LOPID) 600 MG tablet TAKE 1 TABLET BY MOUTH TWICE DAILY BEFORE A MEAL 60 tablet 0  . Insulin Glargine (TOUJEO SOLOSTAR) 300 UNIT/ML SOPN Inject 46 Units into the skin daily. (Patient taking differently: Inject 44 Units into the skin daily. ) 9 mL 3  . insulin lispro (HUMALOG KWIKPEN) 100 UNIT/ML KiwkPen 6-10 units at mealtimes 15 mL 1  . Insulin Pen Needle (B-D ULTRAFINE III SHORT PEN) 31G X 8 MM MISC USE AS DIRECTED TO INJECT TOUJEO DAILY 100 each 5  . INSULIN SYRINGE 1CC/29G 29G X 1/2" 1 ML MISC   6  . INSULIN SYRINGE 1CC/29G 29G X 1/2" 1 ML MISC AS DIRECTED. 100 each 3  . losartan (COZAAR) 100 MG tablet TAKE 1 TABLET BY MOUTH EVERY DAY 30 tablet 0  . metoprolol (LOPRESSOR) 50 MG tablet TAKE 1/2 TABLET  BY MOUTH TWICE DAILY 180 tablet 0  . Multiple Vitamin (MULTIVITAMIN) tablet Take 1 tablet by mouth daily.      Marland Kitchen  omega-3 acid ethyl esters (LOVAZA) 1 g capsule TAKE 2 CAPSULES BY MOUTH DAILY 60 capsule 11  . omeprazole (PRILOSEC) 20 MG capsule TAKE ONE CAPSULE BY MOUTH DAILY 30 capsule 11  . ONE TOUCH ULTRA TEST test strip TEST BLODD SUAGR FOUR TIMES DAILY BEFORE MEALS AND AT BEDTIME USE AS DIRECTED 150 each 0  . potassium chloride SA (K-DUR,KLOR-CON) 20 MEQ tablet TAKE 1 TABLET BY MOUTH TWICE DAILY 180 tablet 0  . Pyridoxine HCl (VITAMIN B-6) 100 MG tablet Take 100 mg by mouth daily.       No current facility-administered medications on file prior to visit.    Allergies  Allergen Reactions  . Lipitor [Atorvastatin Calcium] Diarrhea  . Niacin And Related Hives  . Statins Diarrhea  . Macrobid [Nitrofurantoin Macrocrystal] Other (See Comments)    unknown   Social History   Social History  . Marital status: Widowed    Spouse name: N/A    . Number of children: 3  . Years of education: N/A   Occupational History  . Retired    Social History Main Topics  . Smoking status: Never Smoker  . Smokeless tobacco: Never Used  . Alcohol use No  . Drug use: No  . Sexual activity: Not on file   Other Topics Concern  . 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      Objective:   Physical Exam  Constitutional: She appears well-developed and well-nourished.  Cardiovascular: Normal rate, regular rhythm and normal heart sounds.   Pulmonary/Chest: Effort normal and breath sounds normal.  Musculoskeletal:       Right shoulder: She exhibits decreased range of motion, tenderness, pain and decreased strength. She exhibits no swelling.  Vitals reviewed.         Assessment & Plan:  Candidal intertrigo - Plan: clotrimazole-betamethasone (LOTRISONE) cream  Chronic right shoulder pain  Patient has a frozen right shoulder which I believe began due to subacromial bursitis or possibly a tear in her rotator cuff. She wants to avoid surgery given her advanced age. Therefore we will try a cortisone injection and range of motion exercises at home. Using sterile technique, I injected the right shoulder with 2 mL of lidocaine, 2 mL of Marcaine, and 2 mL of 40 mg per mL Kenalog. She tolerated the procedure well with no complication. I will treat the Candida intertrigo with Lotrisone cream twice daily for 14 days. As the pain in her shoulder hopefully improves, I will her to start performing pendulum exercises to try to increase her range of motion

## 2016-05-13 ENCOUNTER — Other Ambulatory Visit: Payer: Self-pay | Admitting: Family Medicine

## 2016-05-26 ENCOUNTER — Other Ambulatory Visit: Payer: Self-pay | Admitting: Family Medicine

## 2016-05-26 DIAGNOSIS — E1142 Type 2 diabetes mellitus with diabetic polyneuropathy: Secondary | ICD-10-CM

## 2016-05-26 DIAGNOSIS — Z0000 Encounter for general adult medical examination without abnormal findings: Secondary | ICD-10-CM

## 2016-05-26 DIAGNOSIS — Z79899 Other long term (current) drug therapy: Secondary | ICD-10-CM

## 2016-05-26 DIAGNOSIS — M810 Age-related osteoporosis without current pathological fracture: Secondary | ICD-10-CM

## 2016-05-26 DIAGNOSIS — I10 Essential (primary) hypertension: Secondary | ICD-10-CM

## 2016-05-26 DIAGNOSIS — E785 Hyperlipidemia, unspecified: Secondary | ICD-10-CM

## 2016-05-27 ENCOUNTER — Other Ambulatory Visit: Payer: Self-pay | Admitting: Family Medicine

## 2016-06-04 ENCOUNTER — Other Ambulatory Visit: Payer: Self-pay | Admitting: Family Medicine

## 2016-06-04 DIAGNOSIS — Z1231 Encounter for screening mammogram for malignant neoplasm of breast: Secondary | ICD-10-CM

## 2016-06-16 ENCOUNTER — Other Ambulatory Visit: Payer: Self-pay | Admitting: Family Medicine

## 2016-06-23 ENCOUNTER — Other Ambulatory Visit: Payer: Self-pay | Admitting: Family Medicine

## 2016-07-15 ENCOUNTER — Other Ambulatory Visit: Payer: Medicare Other

## 2016-07-16 ENCOUNTER — Ambulatory Visit: Payer: Medicare Other

## 2016-07-16 ENCOUNTER — Ambulatory Visit (INDEPENDENT_AMBULATORY_CARE_PROVIDER_SITE_OTHER): Payer: Medicare Other | Admitting: Family Medicine

## 2016-07-16 VITALS — BP 130/80 | HR 80 | Temp 97.8°F | Resp 16 | Ht 64.0 in | Wt 141.0 lb

## 2016-07-16 DIAGNOSIS — J208 Acute bronchitis due to other specified organisms: Secondary | ICD-10-CM | POA: Diagnosis not present

## 2016-07-16 MED ORDER — BENZONATATE 100 MG PO CAPS: 200.0000 mg | ORAL_CAPSULE | Freq: Three times a day (TID) | ORAL | 0 refills | Status: DC | PRN

## 2016-07-16 MED ORDER — AZITHROMYCIN 250 MG PO TABS: ORAL_TABLET | ORAL | 0 refills | Status: DC

## 2016-07-16 NOTE — Progress Notes (Signed)
Subjective:    Patient ID: Rebecca Perkins, female    DOB: 09-04-1928, 82 y.o.   MRN: [id number]  HPI Symptoms began 5 days ago. Patient reports a nonproductive cough. She denies any shortness of breath. She denies any chest pain she denies any pleurisy. She denies any fever. She denies any sputum production. She denies any rhinorrhea or head congestion or sore throat Past Medical History:  Diagnosis Date  . Anemia   . Diabetes mellitus   . Dyslipidemia   . GERD (gastroesophageal reflux disease)   . Hypertension   . Migraines   . Osteoporosis   . PVD (peripheral vascular disease) ([place])    abi .83 (L), .92 (R)  . RSD (reflex sympathetic dystrophy) 2007   R wrist/hand following fx  . Stroke ([place])   . Varicose veins    Past Surgical History:  Procedure Laterality Date  . ABDOMINAL HYSTERECTOMY  1988  . APPENDECTOMY  1966  . BREAST CYST EXCISION    . CATARACT EXTRACTION  10/2009  . CHOLECYSTECTOMY  1989  . Several benign cyst removed     last 1 in 1972  . TUBAL LIGATION    . UMBILICAL HERNIA REPAIR     Current Outpatient Prescriptions on File Prior to Visit  Medication Sig Dispense Refill  . Blood Glucose Monitoring Suppl (ONE TOUCH ULTRA SYSTEM KIT) W/DEVICE KIT 1 kit by Does not apply route once. Tests Blood sugar before meals and at bedtime 1 each 0  . Calcium Carbonate-Vitamin D (CALTRATE 600+D PO) Take 1 tablet by mouth 2 (two) times daily.    . clopidogrel (PLAVIX) 75 MG tablet TAKE 1 TABLET BY MOUTH DAILY 30 tablet 11  . clotrimazole-betamethasone (LOTRISONE) cream Apply 1 application topically 2 (two) times daily. 30 g 0  . co-enzyme Q-10 30 MG capsule Take 100 mg by mouth 2 (two) times daily.    . ferrous sulfate 325 (65 FE) MG tablet TAKE 1 TABLET BY MOUTH THREE TIMES DAILY WITH MEALS (Patient taking differently: TAKE 1 TABLET BY MOUTH TWO TIMES DAILY WITH MEALS) 90 tablet 4  . furosemide (LASIX) 40 MG tablet TAKE 1 TABLET BY MOUTH EVERY DAY. 30 tablet 11  .  gemfibrozil (LOPID) 600 MG tablet TAKE 1 TABLET BY MOUTH TWICE DAILY BEFORE A MEAL 60 tablet 0  . Insulin Glargine (TOUJEO SOLOSTAR) 300 UNIT/ML SOPN Inject 46 Units into the skin daily. (Patient taking differently: Inject 44 Units into the skin daily. ) 9 mL 3  . insulin lispro (HUMALOG KWIKPEN) 100 UNIT/ML KiwkPen 6-10 units at mealtimes 15 mL 1  . Insulin Pen Needle (B-D ULTRAFINE III SHORT PEN) 31G X 8 MM MISC USE AS DIRECTED TO INJECT TOUJEO DAILY 100 each 5  . INSULIN SYRINGE 1CC/29G 29G X 1/2" 1 ML MISC   6  . INSULIN SYRINGE 1CC/29G 29G X 1/2" 1 ML MISC AS DIRECTED. 100 each 3  . losartan (COZAAR) 100 MG tablet TAKE 1 TABLET BY MOUTH EVERY DAY 30 tablet 0  . metoprolol (LOPRESSOR) 50 MG tablet TAKE 1/2 TABLET  BY MOUTH TWICE DAILY 180 tablet 0  . Multiple Vitamin (MULTIVITAMIN) tablet Take 1 tablet by mouth daily.      Marland Kitchen omega-3 acid ethyl esters (LOVAZA) 1 g capsule TAKE 2 CAPSULES BY MOUTH DAILY 60 capsule 11  . omeprazole (PRILOSEC) 20 MG capsule TAKE ONE CAPSULE BY MOUTH DAILY 30 capsule 11  . ONE TOUCH ULTRA TEST test strip TEST BLOOD SUGAR FOUR TIMES DAILY BEFORE  MEALS AND AT BEDTIME AS DIRECTED 150 each 0  . potassium chloride SA (K-DUR,KLOR-CON) 20 MEQ tablet TAKE 1 TABLET BY MOUTH TWICE DAILY 180 tablet 0  . Pyridoxine HCl (VITAMIN B-6) 100 MG tablet Take 100 mg by mouth daily.       No current facility-administered medications on file prior to visit.    Allergies  Allergen Reactions  . Lipitor [Atorvastatin Calcium] Diarrhea  . Niacin And Related Hives  . Statins Diarrhea  . Macrobid [Nitrofurantoin Macrocrystal] Other (See Comments)    unknown   Social History   Social History  . Marital status: Widowed    Spouse name: N/A  . Number of children: 3  . Years of education: N/A   Occupational History  . Retired    Social History Main Topics  . Smoking status: Never Smoker  . Smokeless tobacco: Never Used  . Alcohol use No  . Drug use: No  . Sexual activity: Not  on file   Other Topics Concern  . 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      Objective:   Physical Exam  Constitutional: She appears well-developed and well-nourished.  HENT:  Right Ear: External ear normal.  Left Ear: External ear normal.  Nose: Nose normal.  Mouth/Throat: Oropharynx is clear and moist. No oropharyngeal exudate.  Neck: Neck supple.  Cardiovascular: Normal rate, regular rhythm and normal heart sounds.   No murmur heard. Pulmonary/Chest: Effort normal and breath sounds normal. No respiratory distress. She has no wheezes. She has no rales.  Abdominal: Soft. Bowel sounds are normal.  Lymphadenopathy:    She has no cervical adenopathy.  Vitals reviewed.         Assessment & Plan:  Acute bronchitis due to other specified organisms - Plan: benzonatate (TESSALON) 100 MG capsule, azithromycin (ZITHROMAX) 250 MG tablet  Symptoms are consistent with viral bronchitis. I recommended tincture of time. Use Tessalon Perles 200 mg every 8 hours as needed for cough. Given her advanced age and diabetes, should she develop worsening cough, purulent sputum production, fever, I did give her a Z-Pak with strict instructions not to fill unless symptoms worsen

## 2016-07-20 ENCOUNTER — Ambulatory Visit: Payer: Medicare Other | Admitting: Endocrinology

## 2016-07-23 ENCOUNTER — Other Ambulatory Visit: Payer: Self-pay | Admitting: Family Medicine

## 2016-08-09 ENCOUNTER — Ambulatory Visit: Payer: Medicare Other | Admitting: Podiatry

## 2016-08-10 ENCOUNTER — Ambulatory Visit: Payer: Medicare Other | Admitting: Podiatry

## 2016-08-16 ENCOUNTER — Other Ambulatory Visit: Payer: Self-pay | Admitting: Family Medicine

## 2016-08-23 ENCOUNTER — Ambulatory Visit: Payer: Medicare Other | Admitting: Podiatry

## 2016-08-25 ENCOUNTER — Ambulatory Visit (INDEPENDENT_AMBULATORY_CARE_PROVIDER_SITE_OTHER): Payer: Medicare Other | Admitting: Podiatry

## 2016-08-25 ENCOUNTER — Encounter: Payer: Self-pay | Admitting: Podiatry

## 2016-08-25 DIAGNOSIS — M79676 Pain in unspecified toe(s): Secondary | ICD-10-CM

## 2016-08-25 DIAGNOSIS — B351 Tinea unguium: Secondary | ICD-10-CM | POA: Diagnosis not present

## 2016-08-25 NOTE — Progress Notes (Signed)
Subjective:     Patient ID: Rebecca Perkins, female   DOB: 13-Jul-1929, 83 y.o.   MRN: [id number]  HPI patient's found to have thick yellow nailbeds 1-5 both feet that are painful when pressed   Review of Systems     Objective:   Physical Exam Neurovascular status intact with yellow brittle nailbeds 1-5 both feet    Assessment:     Mycotic nail infections 1-5 both feet with no iatrogenic bleeding noted    Plan:     Debris painful nailbeds 1-5 both feet with no iatrogenic bleeding noted

## 2016-08-27 ENCOUNTER — Other Ambulatory Visit: Payer: Medicare Other

## 2016-08-27 DIAGNOSIS — E1142 Type 2 diabetes mellitus with diabetic polyneuropathy: Secondary | ICD-10-CM

## 2016-08-27 DIAGNOSIS — E785 Hyperlipidemia, unspecified: Secondary | ICD-10-CM

## 2016-08-27 DIAGNOSIS — M810 Age-related osteoporosis without current pathological fracture: Secondary | ICD-10-CM

## 2016-08-27 DIAGNOSIS — Z79899 Other long term (current) drug therapy: Secondary | ICD-10-CM

## 2016-08-27 DIAGNOSIS — I10 Essential (primary) hypertension: Secondary | ICD-10-CM

## 2016-08-27 DIAGNOSIS — Z0000 Encounter for general adult medical examination without abnormal findings: Secondary | ICD-10-CM

## 2016-08-27 LAB — CBC WITH DIFFERENTIAL/PLATELET
Basophils Absolute: 55 cells/uL (ref 0–200)
Basophils Relative: 1 %
Eosinophils Absolute: 165 cells/uL (ref 15–500)
Eosinophils Relative: 3 %
HCT: 36.9 % (ref 35.0–45.0)
Hemoglobin: 12.2 g/dL (ref 12.0–15.0)
Lymphocytes Relative: 37 %
Lymphs Abs: 2035 cells/uL (ref 850–3900)
MCH: 28.1 pg (ref 27.0–33.0)
MCHC: 33.1 g/dL (ref 32.0–36.0)
MCV: 85 fL (ref 80.0–100.0)
MPV: 9.7 fL (ref 7.5–12.5)
Monocytes Absolute: 495 cells/uL (ref 200–950)
Monocytes Relative: 9 %
Neutro Abs: 2750 cells/uL (ref 1500–7800)
Neutrophils Relative %: 50 %
Platelets: 207 10*3/uL (ref 140–400)
RBC: 4.34 MIL/uL (ref 3.80–5.10)
RDW: 12.9 % (ref 11.0–15.0)
WBC: 5.5 10*3/uL (ref 3.8–10.8)

## 2016-08-27 LAB — TSH: TSH: 2 mIU/L

## 2016-08-28 LAB — COMPLETE METABOLIC PANEL WITH GFR
ALT: 11 U/L (ref 6–29)
AST: 18 U/L (ref 10–35)
Albumin: 3.8 g/dL (ref 3.6–5.1)
Alkaline Phosphatase: 95 U/L (ref 33–130)
BUN: 16 mg/dL (ref 7–25)
CO2: 25 mmol/L (ref 20–31)
Calcium: 9.8 mg/dL (ref 8.6–10.4)
Chloride: 106 mmol/L (ref 98–110)
Creat: 0.89 mg/dL — ABNORMAL HIGH (ref 0.60–0.88)
GFR, Est African American: 68 mL/min (ref >=60)
GFR, Est Non African American: 59 mL/min — ABNORMAL LOW (ref >=60)
Glucose, Bld: 197 mg/dL — ABNORMAL HIGH (ref 70–99)
Potassium: 4.1 mmol/L (ref 3.5–5.3)
Sodium: 143 mmol/L (ref 135–146)
Total Bilirubin: 0.4 mg/dL (ref 0.2–1.2)
Total Protein: 6.7 g/dL (ref 6.1–8.1)

## 2016-08-28 LAB — LIPID PANEL
Cholesterol: 202 mg/dL — ABNORMAL HIGH (ref <200)
HDL: 32 mg/dL — ABNORMAL LOW (ref >50)
Total CHOL/HDL Ratio: 6.3 Ratio — ABNORMAL HIGH (ref <5.0)
Triglycerides: 564 mg/dL — ABNORMAL HIGH (ref <150)

## 2016-08-28 LAB — HEMOGLOBIN A1C
Hgb A1c MFr Bld: 9.9 % — ABNORMAL HIGH (ref <5.7)
Mean Plasma Glucose: 237 mg/dL

## 2016-08-28 LAB — VITAMIN D 25 HYDROXY (VIT D DEFICIENCY, FRACTURES): Vit D, 25-Hydroxy: 26 ng/mL — ABNORMAL LOW (ref 30–100)

## 2016-08-30 ENCOUNTER — Ambulatory Visit (INDEPENDENT_AMBULATORY_CARE_PROVIDER_SITE_OTHER): Payer: Medicare Other | Admitting: Family Medicine

## 2016-08-30 ENCOUNTER — Encounter: Payer: Self-pay | Admitting: Family Medicine

## 2016-08-30 VITALS — BP 140/68 | HR 80 | Temp 97.8°F | Resp 16 | Ht 64.0 in | Wt 142.0 lb

## 2016-08-30 DIAGNOSIS — E119 Type 2 diabetes mellitus without complications: Secondary | ICD-10-CM

## 2016-08-30 DIAGNOSIS — R0989 Other specified symptoms and signs involving the circulatory and respiratory systems: Secondary | ICD-10-CM

## 2016-08-30 DIAGNOSIS — Z0000 Encounter for general adult medical examination without abnormal findings: Secondary | ICD-10-CM | POA: Diagnosis not present

## 2016-08-30 DIAGNOSIS — E781 Pure hyperglyceridemia: Secondary | ICD-10-CM

## 2016-08-30 DIAGNOSIS — Z23 Encounter for immunization: Secondary | ICD-10-CM

## 2016-08-30 NOTE — Addendum Note (Signed)
Addended by: Shary Decamp B on: 08/30/2016 05:03 PM   Modules accepted: Orders

## 2016-08-30 NOTE — Progress Notes (Signed)
Subjective:    Patient ID: Rebecca Perkins, female    DOB: 1929-07-06, 83 y.o.   MRN: [id number]  HPI Here for a CPE.  She has a history of a hysterectomy and therefore does not require a Pap smear. She has never had a colonoscopy and would be due for such however given her age she agrees that it is not prudent to proceed with a colonoscopy. Otherwise she is doing well. Her immunizations including her Pneumovax, Prevnar 13, and flu shot are up-to-date.  Mammogram was performed 12/16 and was normal.  DEXA 7/16 revelaed T score -2.1 in forearm.  In November, the patient was admitted to the hospital with a TIA. Echocardiogram revealed an ejection fraction of 50-55% but no cardioembolic source for stroke. Carotid Dopplers revealed 1-39% stenosis bilaterally in the internal carotid arteries. Her blood pressure today is excellent. She was switched to Plavix for secondary prevention of stroke and TIA. Unfortunately her blood sugar and her cholesterol remained poorly controlled. She has a history of intolerance to all statins. She has never tried pravastatin. Her endocrinologist recently started her on Victoza however the patient has not started it.  08/30/16 Patient is here today for a physical exam. Her diabetes is out of control. Hemoglobin A1c has risen from over 8-9.9. She states that her fasting blood sugars in the morning are 150-200. She is taking toujeo 44 units once a day. She supposed to be on 6-10 units of Humalog with every meal but she is only taking it approximately 50% of the time with dinner. She denies any hypoglycemic spells. She has a history of being very reticent to make any changes to her diabetic regimen despite multiple doctors recommending otherwise. Her triglycerides are also extremely high today at over 400. She is due for a booster on Pneumovax 23. Otherwise her immunizations are up-to-date. Given her age, she does not require a Pap smear, colonoscopy, or mammogram. Appointment on  08/27/2016  Component Date Value Ref Range Status  . Sodium 08/27/2016 143  135 - 146 mmol/L Final  . Potassium 08/27/2016 4.1  3.5 - 5.3 mmol/L Final  . Chloride 08/27/2016 106  98 - 110 mmol/L Final  . CO2 08/27/2016 25  20 - 31 mmol/L Final  . Glucose, Bld 08/27/2016 197* 70 - 99 mg/dL Final  . BUN 08/27/2016 16  7 - 25 mg/dL Final  . Creat 08/27/2016 0.89* 0.60 - 0.88 mg/dL Final   Comment:   For patients > or = 81 years of age: The upper reference limit for Creatinine is approximately 13% higher for people identified as African-American.     . Total Bilirubin 08/27/2016 0.4  0.2 - 1.2 mg/dL Final  . Alkaline Phosphatase 08/27/2016 95  33 - 130 U/L Final  . AST 08/27/2016 18  10 - 35 U/L Final  . ALT 08/27/2016 11  6 - 29 U/L Final  . Total Protein 08/27/2016 6.7  6.1 - 8.1 g/dL Final  . Albumin 08/27/2016 3.8  3.6 - 5.1 g/dL Final  . Calcium 08/27/2016 9.8  8.6 - 10.4 mg/dL Final  . GFR, Est African American 08/27/2016 68  >=60 mL/min Final  . GFR, Est Non African American 08/27/2016 59* >=60 mL/min Final  . TSH 08/27/2016 2.00  mIU/L Final   Comment:   Reference Range   > or = 20 Years  0.40-4.50   Pregnancy Range First trimester  0.26-2.66 Second trimester 0.55-2.73 Third trimester  0.43-2.91     . Cholesterol 08/27/2016  202* <200 mg/dL Final  . Triglycerides 08/27/2016 564* <150 mg/dL Final  . HDL 08/27/2016 32* >50 mg/dL Final  . Total CHOL/HDL Ratio 08/27/2016 6.3* <5.0 Ratio Final  . VLDL 08/27/2016 NOT CALC  <30 mg/dL Final   Comment:   Not calculated due to Triglyceride >400. Suggest ordering Direct LDL (Unit Code: [phone]).   . LDL Cholesterol 08/27/2016 NOT CALC  <100 mg/dL Final   Comment:   Not calculated due to Triglyceride >400. Suggest ordering Direct LDL (Unit Code: [phone]).   . WBC 08/27/2016 5.5  3.8 - 10.8 K/uL Final  . RBC 08/27/2016 4.34  3.80 - 5.10 MIL/uL Final  . Hemoglobin 08/27/2016 12.2  12.0 - 15.0 g/dL Final  . HCT 08/27/2016 36.9   35.0 - 45.0 % Final  . MCV 08/27/2016 85.0  80.0 - 100.0 fL Final  . MCH 08/27/2016 28.1  27.0 - 33.0 pg Final  . MCHC 08/27/2016 33.1  32.0 - 36.0 g/dL Final  . RDW 08/27/2016 12.9  11.0 - 15.0 % Final  . Platelets 08/27/2016 207  140 - 400 K/uL Final  . MPV 08/27/2016 9.7  7.5 - 12.5 fL Final  . Neutro Abs 08/27/2016 2750  1,500 - 7,800 cells/uL Final  . Lymphs Abs 08/27/2016 2035  850 - 3,900 cells/uL Final  . Monocytes Absolute 08/27/2016 495  200 - 950 cells/uL Final  . Eosinophils Absolute 08/27/2016 165  15 - 500 cells/uL Final  . Basophils Absolute 08/27/2016 55  0 - 200 cells/uL Final  . Neutrophils Relative % 08/27/2016 50  % Final  . Lymphocytes Relative 08/27/2016 37  % Final  . Monocytes Relative 08/27/2016 9  % Final  . Eosinophils Relative 08/27/2016 3  % Final  . Basophils Relative 08/27/2016 1  % Final  . Smear Review 08/27/2016 Criteria for review not met   Final  . Hgb A1c MFr Bld 08/27/2016 9.9* <5.7 % Final   Comment:   For someone without known diabetes, a hemoglobin A1c value of 6.5% or greater indicates that they may have diabetes and this should be confirmed with a follow-up test.   For someone with known diabetes, a value <7% indicates that their diabetes is well controlled and a value greater than or equal to 7% indicates suboptimal control. A1c targets should be individualized based on duration of diabetes, age, comorbid conditions, and other considerations.   Currently, no consensus exists for use of hemoglobin A1c for diagnosis of diabetes for children.     . Mean Plasma Glucose 08/27/2016 237  mg/dL Final  . Vit D, 25-Hydroxy 08/27/2016 26* 30 - 100 ng/mL Final   Comment: Vitamin D Status           25-OH Vitamin D        Deficiency                <20 ng/mL        Insufficiency         20 - 29 ng/mL        Optimal             > or = 30 ng/mL   For 25-OH Vitamin D testing on patients on D2-supplementation and patients for whom quantitation of D2  and D3 fractions is required, the QuestAssureD 25-OH VIT D, (D2,D3), LC/MS/MS is recommended: order code [id number] (patients > 2 yrs).     Past Medical History:  Diagnosis Date  . Anemia   . Diabetes mellitus   .  Dyslipidemia   . GERD (gastroesophageal reflux disease)   . Hypertension   . Migraines   . Osteoporosis   . PVD (peripheral vascular disease) ([place])    abi .83 (L), .92 (R)  . RSD (reflex sympathetic dystrophy) 2007   R wrist/hand following fx  . Stroke ([place])   . Varicose veins    Past Surgical History:  Procedure Laterality Date  . ABDOMINAL HYSTERECTOMY  1988  . APPENDECTOMY  1966  . BREAST CYST EXCISION    . CATARACT EXTRACTION  10/2009  . CHOLECYSTECTOMY  1989  . Several benign cyst removed     last 1 in 1972  . TUBAL LIGATION    . UMBILICAL HERNIA REPAIR     Current Outpatient Prescriptions on File Prior to Visit  Medication Sig Dispense Refill  . Blood Glucose Monitoring Suppl (ONE TOUCH ULTRA SYSTEM KIT) W/DEVICE KIT 1 kit by Does not apply route once. Tests Blood sugar before meals and at bedtime 1 each 0  . Calcium Carbonate-Vitamin D (CALTRATE 600+D PO) Take 1 tablet by mouth 2 (two) times daily.    . clopidogrel (PLAVIX) 75 MG tablet TAKE 1 TABLET BY MOUTH DAILY 30 tablet 11  . clotrimazole-betamethasone (LOTRISONE) cream Apply 1 application topically 2 (two) times daily. 30 g 0  . co-enzyme Q-10 30 MG capsule Take 100 mg by mouth 2 (two) times daily.    . ferrous sulfate 325 (65 FE) MG tablet TAKE 1 TABLET BY MOUTH THREE TIMES DAILY WITH MEALS (Patient taking differently: TAKE 1 TABLET BY MOUTH TWO TIMES DAILY WITH MEALS) 90 tablet 4  . furosemide (LASIX) 40 MG tablet TAKE 1 TABLET BY MOUTH EVERY DAY. 30 tablet 11  . gemfibrozil (LOPID) 600 MG tablet TAKE 1 TABLET BY MOUTH TWICE DAILY BEFORE A MEAL 60 tablet 0  . Insulin Glargine (TOUJEO SOLOSTAR) 300 UNIT/ML SOPN Inject 46 Units into the skin daily. (Patient taking differently: Inject 44 Units into the skin  daily. ) 9 mL 3  . insulin lispro (HUMALOG KWIKPEN) 100 UNIT/ML KiwkPen 6-10 units at mealtimes 15 mL 1  . Insulin Pen Needle (B-D ULTRAFINE III SHORT PEN) 31G X 8 MM MISC USE AS DIRECTED TO INJECT TOUJEO DAILY 100 each 5  . INSULIN SYRINGE 1CC/29G 29G X 1/2" 1 ML MISC   6  . INSULIN SYRINGE 1CC/29G 29G X 1/2" 1 ML MISC AS DIRECTED. 100 each 3  . losartan (COZAAR) 100 MG tablet TAKE 1 TABLET BY MOUTH EVERY DAY 30 tablet 11  . metoprolol (LOPRESSOR) 50 MG tablet TAKE 1/2 TABLET  BY MOUTH TWICE DAILY 180 tablet 0  . Multiple Vitamin (MULTIVITAMIN) tablet Take 1 tablet by mouth daily.      Marland Kitchen omega-3 acid ethyl esters (LOVAZA) 1 g capsule TAKE 2 CAPSULES BY MOUTH DAILY 60 capsule 11  . omeprazole (PRILOSEC) 20 MG capsule TAKE ONE CAPSULE BY MOUTH DAILY 30 capsule 11  . ONE TOUCH ULTRA TEST test strip TEST BLOOD SUGAR FOUR TIMES DAILY BEFORE MEALS AND AT BEDTIME AS DIRECTED 150 each 0  . potassium chloride SA (K-DUR,KLOR-CON) 20 MEQ tablet TAKE 1 TABLET BY MOUTH TWICE DAILY 180 tablet 0  . Pyridoxine HCl (VITAMIN B-6) 100 MG tablet Take 100 mg by mouth daily.       No current facility-administered medications on file prior to visit.    Allergies  Allergen Reactions  . Lipitor [Atorvastatin Calcium] Diarrhea  . Niacin And Related Hives  . Statins Diarrhea  . Macrobid [employer]  Macrocrystal] Other (See Comments)    unknown   Social History   Social History  . Marital status: Widowed    Spouse name: N/A  . Number of children: 3  . Years of education: N/A   Occupational History  . Retired    Social History Main Topics  . Smoking status: Never Smoker  . Smokeless tobacco: Never Used  . Alcohol use No  . Drug use: No  . Sexual activity: Not on file   Other Topics Concern  . 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 Stroke Mother 43  . Hypertension Mother   . Clotting disorder Father   . Heart attack Father   . Arrhythmia Sister   . Stroke Brother   . Diabetes Neg Hx   . Thyroid disease Neg Hx       Review of Systems  All other systems reviewed and are negative.      Objective:   Physical Exam  Constitutional: She is oriented to person, place, and time. She appears well-developed and well-nourished. No distress.  HENT:  Head: Normocephalic and atraumatic.  Right Ear: External ear normal.  Left Ear: External ear normal.  Nose: Nose normal.  Mouth/Throat: Oropharynx is clear and moist. No oropharyngeal exudate.  Eyes: Conjunctivae and EOM are normal. Pupils are equal, round, and reactive to light. Right eye exhibits no discharge. Left eye exhibits no discharge. No scleral icterus.  Neck: Normal range of motion. Neck supple. No JVD present. No tracheal deviation present. No thyromegaly present.  Cardiovascular: Normal rate, regular rhythm, normal heart sounds and intact distal pulses.  Exam reveals no gallop and no friction rub.   No murmur heard. Pulmonary/Chest: Effort normal and breath sounds normal. No stridor. No respiratory distress. She has no wheezes. She has no rales. She exhibits no tenderness.  Abdominal: Soft. Bowel sounds are normal. She exhibits no distension and no mass. There is no tenderness. There is no rebound and no guarding.  Musculoskeletal: Normal range of motion. She exhibits no edema or tenderness.  Lymphadenopathy:    She has no cervical adenopathy.  Neurological: She is alert and oriented to person, place, and time. She has normal reflexes. No cranial nerve deficit. She exhibits normal muscle tone. Coordination normal.  Skin: Skin is warm. No rash noted. She is not diaphoretic. No erythema. No pallor.  Psychiatric: She has a normal mood and affect. Her behavior is normal. Judgment and thought content normal.  Vitals reviewed.           Assessment & Plan:  Routine general medical examination at a health care facility  Hypertriglyceridemia  Diabetes mellitus without complication (HCC)  Decreased pedal pulses  His physical exam is normal. Her immunizations are up-to-date. She does not require a Pap smear. . We agree that colonoscopy is not prudent for this patient.  I am very concerned about her blood sugars. Increase Toujeo to 50 units once a day and begin 6 units of Humalog with breakfast and 10 units of Humalog with dinner every day and recheck fasting blood sugars and postprandial sugars in one week. We will then determine the next step to manage her hypertriglyceridemia once we get her sugars under control

## 2016-09-01 ENCOUNTER — Ambulatory Visit (INDEPENDENT_AMBULATORY_CARE_PROVIDER_SITE_OTHER): Payer: Medicare Other | Admitting: Nurse Practitioner

## 2016-09-01 ENCOUNTER — Encounter: Payer: Self-pay | Admitting: Nurse Practitioner

## 2016-09-01 VITALS — BP 140/64 | HR 72 | Ht 64.0 in | Wt 144.8 lb

## 2016-09-01 DIAGNOSIS — E785 Hyperlipidemia, unspecified: Secondary | ICD-10-CM

## 2016-09-01 DIAGNOSIS — Z794 Long term (current) use of insulin: Secondary | ICD-10-CM

## 2016-09-01 DIAGNOSIS — I10 Essential (primary) hypertension: Secondary | ICD-10-CM | POA: Diagnosis not present

## 2016-09-01 DIAGNOSIS — IMO0001 Reserved for inherently not codable concepts without codable children: Secondary | ICD-10-CM

## 2016-09-01 DIAGNOSIS — E781 Pure hyperglyceridemia: Secondary | ICD-10-CM | POA: Diagnosis not present

## 2016-09-01 DIAGNOSIS — E1165 Type 2 diabetes mellitus with hyperglycemia: Secondary | ICD-10-CM

## 2016-09-01 NOTE — Patient Instructions (Signed)
Continue Plavix for  secondary stroke prevention  Maintain strict control of hypertension with blood pressure goal below 130/90, Continue blood pressure medications  diabetes with hemoglobin A1c goal below 6.5% Most recent 9.9 Continue diabetic medications Lipids with LDL cholesterol goal below 70 mg/dL. Continue gemfibrozil. I advised the patient to continue taking coenzyme for statin related myalgias Eat a healthy diet with plenty of whole grains, cereals, fruits and vegetables, exercise regularly  Discharge from neurology

## 2016-09-01 NOTE — Progress Notes (Signed)
[facility]  PATIENT: Rebecca Perkins DOB: 1929/04/04   REASON FOR VISIT: Follow-up for  TIA in November 2016 with risk factors of hypertension high for lipidemia diabetes uncontrolled HISTORY FROM:patient and son    HISTORY OF PRESENT ILLNESS:UPDATE 09/01/2016 Rebecca Perkins, 83 year old female returns for follow-up with history of TIA in November 2016. She is currently on Plavix without further TIA symptoms, no bruising and no bleeding. Her symptoms at that time was inability to speak or get her words out, no recurrence. She has risk factors of hypertension, blood pressure reading in office today 140/60. She is also insulin-dependent diabetic with most recent hemoglobin A1c 9.9. She complains with some numbness in her feet which may be a pearly manifestation of peripheral neuropathy from her diabetes. She had an increase in her insulin dosage. Her LDL was unable to be calculated Because her triglycerides were 564. Her most recent labs were drawn on 08/30/2016. She has refused statin drugs in the past. She continues to walk to the mailbox for exercise but otherwise gets very little. She remains independent in activities of daily living and she continues to drive without difficulty She returns for reevaluation.  UPDATE 07/31/2017CM Rebecca Perkins, 83 year old female returns for follow-up. She has a history of hospital admission for TIA in November 2016. She was unable to speak or get her words out for about 15 minutes and then her symptoms resolved she was started on Plavix. Most recent hemoglobin A1c 8.5 managed by Dr. Dwyane Dee. Lipid profile in April with triglycerides 707 unable to calculate a LDL. She was started back on gemfibrozil. Her diabetes and cholesterol have  remained poorly controlled. She has refused take statin drugs. She walks to the mailbox for exercise. Otherwise she gets little exercise she returns for reevaluation  HISTORY 09/01/15 PS86 year Caucasian lady seen today  for first office follow-up visit following hospital admission for TIA in November 2016.Rebecca Perkins is an 83 y.o. female with a 15 min episode of unable to speak the correct words out, winessed by her family. No other focal neuro symptoms at that time. Sx resolved in 10-15 min.Patient was not administered TPA secondary to resolved deficits. She was admitted for further evaluation and treatment. CT scan of the head on admission showed only chronic microvascular disease and MRI scan showed no acute infarct. Old lacunar infarcts noted in the left basal ganglia and thalamus. MRA of the brain showed no large vessel occlusion or stenosis. Carotid ultrasound showed no significant extracranial stenosis. Transthoracic echo showed ejection fraction of 50-55% without cardiac source of embolism. LDL cholesterol was 81 mg percent. Last hemoglobin A1c on 08/22/15 was 8.7. While lipid profile showed total cholesterol 208, triglycerides 372 HDL 32 and LDL 102 mg percent on 08/22/15. She was started on Plavix which is tolerating well with only minor bruising. She however has not yet started protocol during she has filled the prescription and plans to do so. She does have a prior history of statin myalgias.  REVIEW OF SYSTEMS: Full 14 system review of systems performed and notable only for those listed, all others are neg:  Constitutional: neg  Cardiovascular: neg Ear/Nose/Throat: neg  Skin: neg Eyes: neg Respiratory: neg Gastroitestinal: neg  Hematology/Lymphatic: neg  Endocrine: neg Musculoskeletal: Walking difficulty Allergy/Immunology: neg Neurological: Numbness in the feet Psychiatric: neg Sleep : neg   ALLERGIES: Allergies  Allergen Reactions  . Lipitor [Atorvastatin Calcium] Diarrhea  . Niacin And Related Hives  . Statins Diarrhea  . Macrobid [Nitrofurantoin Macrocrystal]  Other (See Comments)    unknown    HOME MEDICATIONS: Outpatient Medications Prior to Visit  Medication Sig Dispense Refill    . Blood Glucose Monitoring Suppl (ONE TOUCH ULTRA SYSTEM KIT) W/DEVICE KIT 1 kit by Does not apply route once. Tests Blood sugar before meals and at bedtime 1 each 0  . Calcium Carbonate-Vitamin D (CALTRATE 600+D PO) Take 1 tablet by mouth 2 (two) times daily.    . clopidogrel (PLAVIX) 75 MG tablet TAKE 1 TABLET BY MOUTH DAILY 30 tablet 11  . co-enzyme Q-10 30 MG capsule Take 100 mg by mouth 2 (two) times daily.    . ferrous sulfate 325 (65 FE) MG tablet TAKE 1 TABLET BY MOUTH THREE TIMES DAILY WITH MEALS (Patient taking differently: TAKE 1 TABLET BY MOUTH TWO TIMES DAILY WITH MEALS) 90 tablet 4  . furosemide (LASIX) 40 MG tablet TAKE 1 TABLET BY MOUTH EVERY DAY. 30 tablet 11  . gemfibrozil (LOPID) 600 MG tablet TAKE 1 TABLET BY MOUTH TWICE DAILY BEFORE A MEAL 60 tablet 0  . Insulin Glargine (TOUJEO SOLOSTAR) 300 UNIT/ML SOPN Inject 46 Units into the skin daily. (Patient taking differently: Inject 44 Units into the skin daily. ) 9 mL 3  . insulin lispro (HUMALOG KWIKPEN) 100 UNIT/ML KiwkPen 6-10 units at mealtimes 15 mL 1  . Insulin Pen Needle (B-D ULTRAFINE III SHORT PEN) 31G X 8 MM MISC USE AS DIRECTED TO INJECT TOUJEO DAILY 100 each 5  . INSULIN SYRINGE 1CC/29G 29G X 1/2" 1 ML MISC   6  . INSULIN SYRINGE 1CC/29G 29G X 1/2" 1 ML MISC AS DIRECTED. 100 each 3  . losartan (COZAAR) 100 MG tablet TAKE 1 TABLET BY MOUTH EVERY DAY 30 tablet 11  . metoprolol (LOPRESSOR) 50 MG tablet TAKE 1/2 TABLET  BY MOUTH TWICE DAILY 180 tablet 0  . Multiple Vitamin (MULTIVITAMIN) tablet Take 1 tablet by mouth daily.      Marland Kitchen omega-3 acid ethyl esters (LOVAZA) 1 g capsule TAKE 2 CAPSULES BY MOUTH DAILY 60 capsule 11  . omeprazole (PRILOSEC) 20 MG capsule TAKE ONE CAPSULE BY MOUTH DAILY 30 capsule 11  . ONE TOUCH ULTRA TEST test strip TEST BLOOD SUGAR FOUR TIMES DAILY BEFORE MEALS AND AT BEDTIME AS DIRECTED 150 each 0  . potassium chloride SA (K-DUR,KLOR-CON) 20 MEQ tablet TAKE 1 TABLET BY MOUTH TWICE DAILY 180 tablet  0  . Pyridoxine HCl (VITAMIN B-6) 100 MG tablet Take 100 mg by mouth daily.      . clotrimazole-betamethasone (LOTRISONE) cream Apply 1 application topically 2 (two) times daily. 30 g 0   No facility-administered medications prior to visit.     PAST MEDICAL HISTORY: Past Medical History:  Diagnosis Date  . Anemia   . Diabetes mellitus   . Dyslipidemia   . GERD (gastroesophageal reflux disease)   . Hypertension   . Migraines   . Osteoporosis   . PVD (peripheral vascular disease) ([place])    abi .83 (L), .92 (R)  . RSD (reflex sympathetic dystrophy) 2007   R wrist/hand following fx  . Stroke ([place])   . Varicose veins     PAST SURGICAL HISTORY: Past Surgical History:  Procedure Laterality Date  . ABDOMINAL HYSTERECTOMY  1988  . APPENDECTOMY  1966  . BREAST CYST EXCISION    . CATARACT EXTRACTION  10/2009  . CHOLECYSTECTOMY  1989  . Several benign cyst removed     last 1 in 1972  . TUBAL LIGATION    .  UMBILICAL HERNIA REPAIR      FAMILY HISTORY: Family History  Problem Relation Age of Onset  . Stroke Mother 82  . Hypertension Mother   . Clotting disorder Father   . Heart attack Father   . Arrhythmia Sister   . Stroke Brother   . Diabetes Neg Hx   . Thyroid disease Neg Hx     SOCIAL HISTORY: Social History   Social History  . Marital status: Widowed    Spouse name: N/A  . Number of children: 3  . Years of education: N/A   Occupational History  . Retired    Social History Main Topics  . Smoking status: Never Smoker  . Smokeless tobacco: Never Used  . Alcohol use No  . Drug use: No  . Sexual activity: Not on file   Other Topics Concern  . 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 General:  Caucasian lady, seated, in no evident distress Head: head normocephalic and atraumatic.  Neck: supple with no carotid  bruits Cardiovascular: regular rate and rhythm, no murmurs Musculoskeletal: no deformity Skin:  no rash/petichiae   Neurological examination  Mental Status: Awake and fully alert. Oriented to place and time.  Attention span, concentration and fund of knowledge appropriate. Mood and affect appropriate.  Cranial Nerves: Pupils equal, briskly reactive to light. Extraocular movements full without nystagmus. Visual fields full to confrontation. Hearing intact. Facial sensation intact. Face, tongue, palate moves normally and symmetrically.  Motor: Normal bulk and tone. Normal strength in all tested extremity muscles. Sensory.: intact to touch ,pinprick .position and vibratory sensation In the upper and lower extremities.  Coordination: Rapid alternating movements normal in all extremities. Finger-to-nose and heel-to-shin performed accurately bilaterally. Gait and Station: Arises from chair without difficulty. Stance is normal. Gait demonstrates normal stride length and balance . Unable to heel, toe and tandem walk without difficulty. No assistive device Reflexes: 1+ and symmetric. Toes downgoing.   DIAGNOSTIC DATA (LABS, IMAGING, TESTING) - I reviewed patient records, labs, notes, testing and imaging myself where available.  Lab Results  Component Value Date   WBC 5.5 08/27/2016   HGB 12.2 08/27/2016   HCT 36.9 08/27/2016   MCV 85.0 08/27/2016   PLT 207 08/27/2016      Component Value Date/Time   NA 143 08/27/2016 1036   K 4.1 08/27/2016 1036   CL 106 08/27/2016 1036   CO2 25 08/27/2016 1036   GLUCOSE 197 (H) 08/27/2016 1036   BUN 16 08/27/2016 1036   CREATININE 0.89 (H) 08/27/2016 1036   CALCIUM 9.8 08/27/2016 1036   PROT 6.7 08/27/2016 1036   ALBUMIN 3.8 08/27/2016 1036   AST 18 08/27/2016 1036   ALT 11 08/27/2016 1036   ALKPHOS  95 08/27/2016 1036   BILITOT 0.4 08/27/2016 1036   GFRNONAA 59 (L) 08/27/2016 1036   GFRAA 68 08/27/2016 1036   Lab Results  Component Value Date   CHOL 202 (H) 08/27/2016   HDL 32 (L) 08/27/2016   LDLCALC NOT CALC 08/27/2016   LDLDIRECT 41.8 09/13/2011   TRIG 564 (H) 08/27/2016   CHOLHDL 6.3 (H) 08/27/2016  Lab Results  Component Value Date   HGBA1C 9.9 (H) 08/27/2016   Lab Results  Component Value Date   VITAMINB12 1,072 (H) 12/10/2013   Lab Results  Component Value Date   TSH 2.00 08/27/2016      ASSESSMENT AND PLAN 47 year lady with left hemispheric TIA in November 2016 likely from small vessel disease with vascular risk factors of hypertension, hyperlipidemia, diabetes and age.  PLAN: Continue Plavix for  secondary stroke prevention  Maintain strict control of hypertension with blood pressure goal below 130/90, Continue blood pressure medications  diabetes with hemoglobin A1c goal below 6.5% Most recent 9.9 Continue diabetic medications Lipids with LDL cholesterol goal below 70 mg/dL. Continue gemfibrozil. I advised the patient to continue taking coenzyme for statin related myalgias Eat a healthy diet with plenty of whole grains, cereals, fruits and vegetables, exercise regularly  Discharge from neurology I spent 20 minutes in total face to face time with the patient more than 50% of which was spent counseling and coordination of care, reviewing labs  results reviewing medications and discussing and reviewing the diagnosis of stroke and importance of risk factor management. I'm concerned with her uncontrolled diabetes and elevated triglycerides. Her numbness in her feet may be the beginning of peripheral neuropathy from her diabetes. Additional questions answered for son patient Dennie Bible, [facility], [facility], [facility] [address] [place], [place] [address] [phone]

## 2016-09-02 NOTE — Progress Notes (Signed)
I agree with the above plan 

## 2016-09-06 ENCOUNTER — Ambulatory Visit (INDEPENDENT_AMBULATORY_CARE_PROVIDER_SITE_OTHER): Payer: Medicare Other | Admitting: Family Medicine

## 2016-09-06 ENCOUNTER — Encounter: Payer: Self-pay | Admitting: Family Medicine

## 2016-09-06 VITALS — BP 162/70 | HR 78 | Temp 98.2°F | Resp 16 | Ht 64.0 in | Wt 144.0 lb

## 2016-09-06 DIAGNOSIS — E119 Type 2 diabetes mellitus without complications: Secondary | ICD-10-CM | POA: Diagnosis not present

## 2016-09-06 NOTE — Progress Notes (Signed)
Subjective:    Patient ID: Rebecca Perkins, female    DOB: 1929-07-06, 83 y.o.   MRN: [id number]  HPI Here for a CPE.  She has a history of a hysterectomy and therefore does not require a Pap smear. She has never had a colonoscopy and would be due for such however given her age she agrees that it is not prudent to proceed with a colonoscopy. Otherwise she is doing well. Her immunizations including her Pneumovax, Prevnar 13, and flu shot are up-to-date.  Mammogram was performed 12/16 and was normal.  DEXA 7/16 revelaed T score -2.1 in forearm.  In November, the patient was admitted to the hospital with a TIA. Echocardiogram revealed an ejection fraction of 50-55% but no cardioembolic source for stroke. Carotid Dopplers revealed 1-39% stenosis bilaterally in the internal carotid arteries. Her blood pressure today is excellent. She was switched to Plavix for secondary prevention of stroke and TIA. Unfortunately her blood sugar and her cholesterol remained poorly controlled. She has a history of intolerance to all statins. She has never tried pravastatin. Her endocrinologist recently started her on Victoza however the patient has not started it.  08/30/16 Patient is here today for a physical exam. Her diabetes is out of control. Hemoglobin A1c has risen from over 8-9.9. She states that her fasting blood sugars in the morning are 150-200. She is taking toujeo 44 units once a day. She supposed to be on 6-10 units of Humalog with every meal but she is only taking it approximately 50% of the time with dinner. She denies any hypoglycemic spells. She has a history of being very reticent to make any changes to her diabetic regimen despite multiple doctors recommending otherwise. Her triglycerides are also extremely high today at over 400. She is due for a booster on Pneumovax 23. Otherwise her immunizations are up-to-date. Given her age, she does not require a Pap smear, colonoscopy, or mammogram. Appointment on  08/27/2016  Component Date Value Ref Range Status  . Sodium 08/27/2016 143  135 - 146 mmol/L Final  . Potassium 08/27/2016 4.1  3.5 - 5.3 mmol/L Final  . Chloride 08/27/2016 106  98 - 110 mmol/L Final  . CO2 08/27/2016 25  20 - 31 mmol/L Final  . Glucose, Bld 08/27/2016 197* 70 - 99 mg/dL Final  . BUN 08/27/2016 16  7 - 25 mg/dL Final  . Creat 08/27/2016 0.89* 0.60 - 0.88 mg/dL Final   Comment:   For patients > or = 81 years of age: The upper reference limit for Creatinine is approximately 13% higher for people identified as African-American.     . Total Bilirubin 08/27/2016 0.4  0.2 - 1.2 mg/dL Final  . Alkaline Phosphatase 08/27/2016 95  33 - 130 U/L Final  . AST 08/27/2016 18  10 - 35 U/L Final  . ALT 08/27/2016 11  6 - 29 U/L Final  . Total Protein 08/27/2016 6.7  6.1 - 8.1 g/dL Final  . Albumin 08/27/2016 3.8  3.6 - 5.1 g/dL Final  . Calcium 08/27/2016 9.8  8.6 - 10.4 mg/dL Final  . GFR, Est African American 08/27/2016 68  >=60 mL/min Final  . GFR, Est Non African American 08/27/2016 59* >=60 mL/min Final  . TSH 08/27/2016 2.00  mIU/L Final   Comment:   Reference Range   > or = 20 Years  0.40-4.50   Pregnancy Range First trimester  0.26-2.66 Second trimester 0.55-2.73 Third trimester  0.43-2.91     . Cholesterol 08/27/2016  202* <200 mg/dL Final  . Triglycerides 08/27/2016 564* <150 mg/dL Final  . HDL 08/27/2016 32* >50 mg/dL Final  . Total CHOL/HDL Ratio 08/27/2016 6.3* <5.0 Ratio Final  . VLDL 08/27/2016 NOT CALC  <30 mg/dL Final   Comment:   Not calculated due to Triglyceride >400. Suggest ordering Direct LDL (Unit Code: [id number]).   . LDL Cholesterol 08/27/2016 NOT CALC  <100 mg/dL Final   Comment:   Not calculated due to Triglyceride >400. Suggest ordering Direct LDL (Unit Code: [id number]).   . WBC 08/27/2016 5.5  3.8 - 10.8 K/uL Final  . RBC 08/27/2016 4.34  3.80 - 5.10 MIL/uL Final  . Hemoglobin 08/27/2016 12.2  12.0 - 15.0 g/dL Final  . HCT 08/27/2016 36.9   35.0 - 45.0 % Final  . MCV 08/27/2016 85.0  80.0 - 100.0 fL Final  . MCH 08/27/2016 28.1  27.0 - 33.0 pg Final  . MCHC 08/27/2016 33.1  32.0 - 36.0 g/dL Final  . RDW 08/27/2016 12.9  11.0 - 15.0 % Final  . Platelets 08/27/2016 207  140 - 400 K/uL Final  . MPV 08/27/2016 9.7  7.5 - 12.5 fL Final  . Neutro Abs 08/27/2016 2750  1,500 - 7,800 cells/uL Final  . Lymphs Abs 08/27/2016 2035  850 - 3,900 cells/uL Final  . Monocytes Absolute 08/27/2016 495  200 - 950 cells/uL Final  . Eosinophils Absolute 08/27/2016 165  15 - 500 cells/uL Final  . Basophils Absolute 08/27/2016 55  0 - 200 cells/uL Final  . Neutrophils Relative % 08/27/2016 50  % Final  . Lymphocytes Relative 08/27/2016 37  % Final  . Monocytes Relative 08/27/2016 9  % Final  . Eosinophils Relative 08/27/2016 3  % Final  . Basophils Relative 08/27/2016 1  % Final  . Smear Review 08/27/2016 Criteria for review not met   Final  . Hgb A1c MFr Bld 08/27/2016 9.9* <5.7 % Final   Comment:   For someone without known diabetes, a hemoglobin A1c value of 6.5% or greater indicates that they may have diabetes and this should be confirmed with a follow-up test.   For someone with known diabetes, a value <7% indicates that their diabetes is well controlled and a value greater than or equal to 7% indicates suboptimal control. A1c targets should be individualized based on duration of diabetes, age, comorbid conditions, and other considerations.   Currently, no consensus exists for use of hemoglobin A1c for diagnosis of diabetes for children.     . Mean Plasma Glucose 08/27/2016 237  mg/dL Final  . Vit D, 25-Hydroxy 08/27/2016 26* 30 - 100 ng/mL Final   Comment: Vitamin D Status           25-OH Vitamin D        Deficiency                <20 ng/mL        Insufficiency         20 - 29 ng/mL        Optimal             > or = 30 ng/mL   For 25-OH Vitamin D testing on patients on D2-supplementation and patients for whom quantitation of D2  and D3 fractions is required, the [facility] 25-OH VIT D, (D2,D3), LC/MS/MS is recommended: order code [phone] (patients > 2 yrs).    At that time, my plan was: His physical exam is normal. Her immunizations are up-to-date. She does not  require a Pap smear. . We agree that colonoscopy is not prudent for this patient.  I am very concerned about her blood sugars. Increase Toujeo to 50 units once a day and begin 6 units of Humalog with breakfast and 10 units of Humalog with dinner every day and recheck fasting blood sugars and postprandial sugars in one week. We will then determine the next step to manage her hypertriglyceridemia once we get her sugars under control  09/06/16 Patient took 1 dose Toujeo 50 units. She did not even try to use the Humalog. She had hypoglycemia with a blood sugar in the 80s. She states that she felt so bad she did not even try the Humalog. Immediately she discontinued her plan after that and resume her previous dose of 44 units of Toujeo and Humalog 5 units with dinner. Past Medical History:  Diagnosis Date  . Anemia   . Diabetes mellitus   . Dyslipidemia   . GERD (gastroesophageal reflux disease)   . Hypertension   . Migraines   . Osteoporosis   . PVD (peripheral vascular disease) ([place])    abi .83 (L), .92 (R)  . RSD (reflex sympathetic dystrophy) 2007   R wrist/hand following fx  . Stroke ([place])   . Varicose veins    Past Surgical History:  Procedure Laterality Date  . ABDOMINAL HYSTERECTOMY  1988  . APPENDECTOMY  1966  . BREAST CYST EXCISION    . CATARACT EXTRACTION  10/2009  . CHOLECYSTECTOMY  1989  . Several benign cyst removed     last 1 in 1972  . TUBAL LIGATION    . UMBILICAL HERNIA REPAIR     Current Outpatient Prescriptions on File Prior to Visit  Medication Sig Dispense Refill  . Blood Glucose Monitoring Suppl (ONE TOUCH ULTRA SYSTEM KIT) W/DEVICE KIT 1 kit by Does not apply route once. Tests Blood sugar before meals and at bedtime 1 each 0  .  Calcium Carbonate-Vitamin D (CALTRATE 600+D PO) Take 1 tablet by mouth 2 (two) times daily.    . clopidogrel (PLAVIX) 75 MG tablet TAKE 1 TABLET BY MOUTH DAILY 30 tablet 11  . co-enzyme Q-10 30 MG capsule Take 100 mg by mouth 2 (two) times daily.    . ferrous sulfate 325 (65 FE) MG tablet TAKE 1 TABLET BY MOUTH THREE TIMES DAILY WITH MEALS (Patient taking differently: TAKE 1 TABLET BY MOUTH TWO TIMES DAILY WITH MEALS) 90 tablet 4  . furosemide (LASIX) 40 MG tablet TAKE 1 TABLET BY MOUTH EVERY DAY. 30 tablet 11  . gemfibrozil (LOPID) 600 MG tablet TAKE 1 TABLET BY MOUTH TWICE DAILY BEFORE A MEAL 60 tablet 0  . Insulin Glargine (TOUJEO SOLOSTAR) 300 UNIT/ML SOPN Inject 46 Units into the skin daily. (Patient taking differently: Inject 44 Units into the skin daily. ) 9 mL 3  . insulin lispro (HUMALOG KWIKPEN) 100 UNIT/ML KiwkPen 6-10 units at mealtimes 15 mL 1  . Insulin Pen Needle (B-D ULTRAFINE III SHORT PEN) 31G X 8 MM MISC USE AS DIRECTED TO INJECT TOUJEO DAILY 100 each 5  . INSULIN SYRINGE 1CC/29G 29G X 1/2" 1 ML MISC   6  . INSULIN SYRINGE 1CC/29G 29G X 1/2" 1 ML MISC AS DIRECTED. 100 each 3  . losartan (COZAAR) 100 MG tablet TAKE 1 TABLET BY MOUTH EVERY DAY 30 tablet 11  . metoprolol (LOPRESSOR) 50 MG tablet TAKE 1/2 TABLET  BY MOUTH TWICE DAILY 180 tablet 0  . Multiple Vitamin (MULTIVITAMIN) tablet Take  1 tablet by mouth daily.      Marland Kitchen omega-3 acid ethyl esters (LOVAZA) 1 g capsule TAKE 2 CAPSULES BY MOUTH DAILY 60 capsule 11  . omeprazole (PRILOSEC) 20 MG capsule TAKE ONE CAPSULE BY MOUTH DAILY 30 capsule 11  . ONE TOUCH ULTRA TEST test strip TEST BLOOD SUGAR FOUR TIMES DAILY BEFORE MEALS AND AT BEDTIME AS DIRECTED 150 each 0  . potassium chloride SA (K-DUR,KLOR-CON) 20 MEQ tablet TAKE 1 TABLET BY MOUTH TWICE DAILY 180 tablet 0  . Pyridoxine HCl (VITAMIN B-6) 100 MG tablet Take 100 mg by mouth daily.       No current facility-administered medications on file prior to visit.    Allergies    Allergen Reactions  . Lipitor [Atorvastatin Calcium] Diarrhea  . Niacin And Related Hives  . Statins Diarrhea  . Macrobid [Nitrofurantoin Macrocrystal] Other (See Comments)    unknown   Social History   Social History  . Marital status: Widowed    Spouse name: N/A  . Number of children: 3  . Years of education: N/A   Occupational History  . Retired    Social History Main Topics  . Smoking status: Never Smoker  . Smokeless tobacco: Never Used  . Alcohol use No  . Drug use: No  . Sexual activity: Not on file   Other Topics Concern  . 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 Stroke Mother 75  . Hypertension Mother   . Clotting disorder Father   . Heart attack Father   . Arrhythmia Sister   . Stroke Brother   . Diabetes Neg Hx   . Thyroid disease Neg Hx       Review of Systems  All other systems reviewed and are negative.      Objective:   Physical Exam  Constitutional: She is oriented to person, place, and time. She appears well-developed and well-nourished. No distress.  HENT:  Head: Normocephalic and atraumatic.  Right Ear: External ear normal.  Left Ear: External ear normal.  Nose: Nose normal.  Mouth/Throat: Oropharynx is clear and moist. No oropharyngeal exudate.  Eyes: Conjunctivae and EOM are normal. Pupils are equal, round, and reactive to light. Right eye exhibits no discharge. Left eye exhibits no discharge. No scleral icterus.  Neck: Normal range of motion. Neck supple. No JVD present. No tracheal deviation present. No thyromegaly present.  Cardiovascular: Normal rate, regular rhythm, normal heart sounds and intact distal pulses.  Exam reveals no gallop and no friction rub.   No murmur heard. Pulmonary/Chest: Effort normal and breath sounds normal. No stridor. No  respiratory distress. She has no wheezes. She has no rales. She exhibits no tenderness.  Abdominal: Soft. Bowel sounds are normal. She exhibits no distension and no mass. There is no tenderness. There is no rebound and no guarding.  Musculoskeletal: Normal range of motion. She exhibits no edema or tenderness.  Lymphadenopathy:    She has no cervical adenopathy.  Neurological: She is alert and oriented to person, place, and time. She has normal reflexes. No cranial nerve deficit. She exhibits normal muscle tone. Coordination normal.  Skin: Skin is warm. No rash noted. She is not diaphoretic. No erythema. No pallor.  Psychiatric: She has a normal mood and affect. Her behavior is normal. Judgment  and thought content normal.  Vitals reviewed.         Assessment & Plan:  Diabetes mellitus without complication [facility])  Patient is noncompliant. She is extremely sweet. She is a very nice person. However she is very scared of hypoglycemia. I tried to reason with her to explain that her total chemotherapy dose of insulin was 1 unit more than what she had been taking and that I doubted this is what caused her hypoglycemic episode. However I believe that the patient is resistant to any change as she does not see any negative effect to having sugars run between 60-67 at 83 years old.  She is scheduled to see Dr. Dwyane Dee later this week

## 2016-09-08 ENCOUNTER — Ambulatory Visit (INDEPENDENT_AMBULATORY_CARE_PROVIDER_SITE_OTHER): Payer: Medicare Other | Admitting: Endocrinology

## 2016-09-08 ENCOUNTER — Encounter: Payer: Self-pay | Admitting: Endocrinology

## 2016-09-08 VITALS — BP 150/82 | HR 62 | Wt 142.0 lb

## 2016-09-08 DIAGNOSIS — E782 Mixed hyperlipidemia: Secondary | ICD-10-CM | POA: Diagnosis not present

## 2016-09-08 DIAGNOSIS — E1165 Type 2 diabetes mellitus with hyperglycemia: Secondary | ICD-10-CM

## 2016-09-08 DIAGNOSIS — Z794 Long term (current) use of insulin: Secondary | ICD-10-CM

## 2016-09-08 MED ORDER — VICTOZA 18 MG/3ML ~~LOC~~ SOPN: 1.2000 mg | PEN_INJECTOR | Freq: Every day | SUBCUTANEOUS | 3 refills | Status: DC

## 2016-09-08 NOTE — Progress Notes (Signed)
Patient ID: Rebecca Perkins, female   DOB: [birth year], 83 y.o.   MRN: [id number]            Reason for Appointment: Follow-up for Type 2 Diabetes  Referring physician: Pickard  History of Present Illness:          Diagnosis: Type 2 diabetes mellitus, date of diagnosis: 2006       Past history:  At the time of diagnosis she was feeling tired and weak but does not know what her initial blood sugar was. She probably was tried on metformin initially but because of GI side effects discussed not be continued Also did not tolerate Actos because of nausea. She thinks that she was started on insulin within a few months of her initial diagnosis  Not clear what insulin she was trying in the beginning but she thinks it was NovoLog; however for the last few years has been on Lantus only Her blood sugars have been persistently poorly controlled with A1c usually 8-9% about 2011 At initial consultation she was taking 60 units of Lantus insulin without any mealtime coverage Because of her tendency to low blood sugars including overnight she was told to reduce the dose to 50 units  Recent history:   INSULIN regimen is described as: 44 units Toujeo in a.m., Humalog 6-8 units at supper  On her visit in 3/17 because of her high A1c she was advised to start taking mealtime insulin at suppertime   Her last A1c was 8.1 and is now 9.9 as of 1/18 She also has not been seen in follow-up in several months  Current management, blood sugars and problems identified:  She has overall much higher sugars and she does not know why  Although she is trying to take her mealtime insulin more consistently she may forget about once or twice a week  However now she appears to be having mostly high fasting readings also compared to before  Morning sugars are still showing fluctuation with some readings as low as 78  She usually has consistently high readings late at night and probably also in the afternoon when she  is not checking much  She was told by her PCP to take 10 units of Humalog at suppertime with she felt her sugar dropping when it was 82 after supper and she will bring back to her previous doses  She does not think she forgets her Toujeo  Although she does not eat much carbohydrate at breakfast or any not clear if her sugars are going up during the day  Currently not taking any oral hypoglycemic drugs and does not think she ever tried Victoza  She has back on regular soft drinks        Oral hypoglycemic drugs the patient is taking are:   none     Side effects from medications have been: Metformin, Actos: nausea, diarrhea    Compliance with the medical regimen: Fair  Hypoglycemia: None  but she feels a little hypoglycemic when blood sugars are around 80  Glucose monitoring:  done 1-2 times a day         Glucometer: One Touch.      Blood Glucose readings    Mean values apply above for all meters except median for One Touch  PRE-MEAL Fasting Lunch Dinner Bedtime Overall  Glucose range: 78-313    82-455    Mean/median: 210    270  229     Self-care: The diet that the patient has  been following is: tries to limit portions .     Meals: 2-3 meals per day. Breakfast is variable, bacon, fruit, egg    Dinner 7 pm  Occasionally At dinner will have fried seafood, baked potato and hush puppies with dinner otherwise has relatively low amount of starch.   She will snack on peanut butter crackers at lunch along with the regular soft drinks and occasionally Sometimes will and a sandwich at lunch            Exercise: a little because of shortness of breath and fatigue on exertion         Dietician visit, most recent none               Weight history: Highest weight has been 170  Wt Readings from Last 3 Encounters:  09/08/16 142 lb (64.4 kg)  09/06/16 144 lb (65.3 kg)  09/01/16 144 lb 12.8 oz (65.7 kg)    Glycemic control:   Lab Results  Component Value Date   HGBA1C 9.9 (H)  08/27/2016   HGBA1C 8.1 04/15/2016   HGBA1C 8.5 12/15/2015   Lab Results  Component Value Date   MICROALBUR 1.2 02/10/2016   LDLCALC NOT CALC 08/27/2016   CREATININE 0.89 (H) 08/27/2016    No visits with results within 1 Week(s) from this visit.  Latest known visit with results is:  Appointment on 08/27/2016  Component Date Value Ref Range Status  . Sodium 08/27/2016 143  135 - 146 mmol/L Final  . Potassium 08/27/2016 4.1  3.5 - 5.3 mmol/L Final  . Chloride 08/27/2016 106  98 - 110 mmol/L Final  . CO2 08/27/2016 25  20 - 31 mmol/L Final  . Glucose, Bld 08/27/2016 197* 70 - 99 mg/dL Final  . BUN 08/27/2016 16  7 - 25 mg/dL Final  . Creat 08/27/2016 0.89* 0.60 - 0.88 mg/dL Final   Comment:   For patients > or = 81 years of age: The upper reference limit for Creatinine is approximately 13% higher for people identified as African-American.     . Total Bilirubin 08/27/2016 0.4  0.2 - 1.2 mg/dL Final  . Alkaline Phosphatase 08/27/2016 95  33 - 130 U/L Final  . AST 08/27/2016 18  10 - 35 U/L Final  . ALT 08/27/2016 11  6 - 29 U/L Final  . Total Protein 08/27/2016 6.7  6.1 - 8.1 g/dL Final  . Albumin 08/27/2016 3.8  3.6 - 5.1 g/dL Final  . Calcium 08/27/2016 9.8  8.6 - 10.4 mg/dL Final  . GFR, Est African American 08/27/2016 68  >=60 mL/min Final  . GFR, Est Non African American 08/27/2016 59* >=60 mL/min Final  . TSH 08/27/2016 2.00  mIU/L Final   Comment:   Reference Range   > or = 20 Years  0.40-4.50   Pregnancy Range First trimester  0.26-2.66 Second trimester 0.55-2.73 Third trimester  0.43-2.91     . Cholesterol 08/27/2016 202* <200 mg/dL Final  . Triglycerides 08/27/2016 564* <150 mg/dL Final  . HDL 08/27/2016 32* >50 mg/dL Final  . Total CHOL/HDL Ratio 08/27/2016 6.3* <5.0 Ratio Final  . VLDL 08/27/2016 NOT CALC  <30 mg/dL Final   Comment:   Not calculated due to Triglyceride >400. Suggest ordering Direct LDL (Unit Code: [phone]).   . LDL Cholesterol 08/27/2016  NOT CALC  <100 mg/dL Final   Comment:   Not calculated due to Triglyceride >400. Suggest ordering Direct LDL (Unit Code: [phone]).   . WBC 08/27/2016 5.5  3.8 - 10.8 K/uL Final  . RBC 08/27/2016 4.34  3.80 - 5.10 MIL/uL Final  . Hemoglobin 08/27/2016 12.2  12.0 - 15.0 g/dL Final  . HCT 08/27/2016 36.9  35.0 - 45.0 % Final  . MCV 08/27/2016 85.0  80.0 - 100.0 fL Final  . MCH 08/27/2016 28.1  27.0 - 33.0 pg Final  . MCHC 08/27/2016 33.1  32.0 - 36.0 g/dL Final  . RDW 08/27/2016 12.9  11.0 - 15.0 % Final  . Platelets 08/27/2016 207  140 - 400 K/uL Final  . MPV 08/27/2016 9.7  7.5 - 12.5 fL Final  . Neutro Abs 08/27/2016 2750  1,500 - 7,800 cells/uL Final  . Lymphs Abs 08/27/2016 2035  850 - 3,900 cells/uL Final  . Monocytes Absolute 08/27/2016 495  200 - 950 cells/uL Final  . Eosinophils Absolute 08/27/2016 165  15 - 500 cells/uL Final  . Basophils Absolute 08/27/2016 55  0 - 200 cells/uL Final  . Neutrophils Relative % 08/27/2016 50  % Final  . Lymphocytes Relative 08/27/2016 37  % Final  . Monocytes Relative 08/27/2016 9  % Final  . Eosinophils Relative 08/27/2016 3  % Final  . Basophils Relative 08/27/2016 1  % Final  . Smear Review 08/27/2016 Criteria for review not met   Final  . Hgb A1c MFr Bld 08/27/2016 9.9* <5.7 % Final   Comment:   For someone without known diabetes, a hemoglobin A1c value of 6.5% or greater indicates that they may have diabetes and this should be confirmed with a follow-up test.   For someone with known diabetes, a value <7% indicates that their diabetes is well controlled and a value greater than or equal to 7% indicates suboptimal control. A1c targets should be individualized based on duration of diabetes, age, comorbid conditions, and other considerations.   Currently, no consensus exists for use of hemoglobin A1c for diagnosis of diabetes for children.     . Mean Plasma Glucose 08/27/2016 237  mg/dL Final  . Vit D, 25-Hydroxy 08/27/2016 26* 30 -  100 ng/mL Final   Comment: Vitamin D Status           25-OH Vitamin D        Deficiency                <20 ng/mL        Insufficiency         20 - 29 ng/mL        Optimal             > or = 30 ng/mL   For 25-OH Vitamin D testing on patients on D2-supplementation and patients for whom quantitation of D2 and D3 fractions is required, the QuestAssureD 25-OH VIT D, (D2,D3), LC/MS/MS is recommended: order code [phone] (patients > 2 yrs).       Allergies as of 09/08/2016      Reactions   Lipitor [atorvastatin Calcium] Diarrhea   Niacin And Related Hives   Statins Diarrhea   Macrobid [nitrofurantoin Macrocrystal] Other (See Comments)   unknown      Medication List       Accurate as of 09/08/16  2:08 PM. Always use your most recent med list.          CALTRATE 600+D PO Take 1 tablet by mouth 2 (two) times daily.   clopidogrel 75 MG tablet Commonly known as:  PLAVIX TAKE 1 TABLET BY MOUTH DAILY   co-enzyme Q-10 30 MG capsule Take 100  mg by mouth 2 (two) times daily.   ferrous sulfate 325 (65 FE) MG tablet TAKE 1 TABLET BY MOUTH THREE TIMES DAILY WITH MEALS   furosemide 40 MG tablet Commonly known as:  LASIX TAKE 1 TABLET BY MOUTH EVERY DAY.   gemfibrozil 600 MG tablet Commonly known as:  LOPID TAKE 1 TABLET BY MOUTH TWICE DAILY BEFORE A MEAL   Insulin Glargine 300 UNIT/ML Sopn Commonly known as:  TOUJEO SOLOSTAR Inject 46 Units into the skin daily.   insulin lispro 100 UNIT/ML KiwkPen Commonly known as:  HUMALOG KWIKPEN 6-10 units at mealtimes   Insulin Pen Needle 31G X 8 MM Misc Commonly known as:  B-D ULTRAFINE III SHORT PEN USE AS DIRECTED TO INJECT TOUJEO DAILY   INSULIN SYRINGE 1CC/29G 29G X 1/2" 1 ML Misc   INSULIN SYRINGE 1CC/29G 29G X 1/2" 1 ML Misc AS DIRECTED.   losartan 100 MG tablet Commonly known as:  COZAAR TAKE 1 TABLET BY MOUTH EVERY DAY   metoprolol 50 MG tablet Commonly known as:  LOPRESSOR TAKE 1/2 TABLET  BY MOUTH TWICE DAILY     multivitamin tablet Take 1 tablet by mouth daily.   omega-3 acid ethyl esters 1 g capsule Commonly known as:  LOVAZA TAKE 2 CAPSULES BY MOUTH DAILY   omeprazole 20 MG capsule Commonly known as:  PRILOSEC TAKE ONE CAPSULE BY MOUTH DAILY   ONE TOUCH ULTRA SYSTEM KIT w/Device Kit 1 kit by Does not apply route once. Tests Blood sugar before meals and at bedtime   ONE TOUCH ULTRA TEST test strip Generic drug:  glucose blood TEST BLOOD SUGAR FOUR TIMES DAILY BEFORE MEALS AND AT BEDTIME AS DIRECTED   potassium chloride SA 20 MEQ tablet Commonly known as:  K-DUR,KLOR-CON TAKE 1 TABLET BY MOUTH TWICE DAILY   pyridOXINE 100 MG tablet Commonly known as:  VITAMIN B-6 Take 100 mg by mouth daily.   VICTOZA 18 MG/3ML Sopn Generic drug:  liraglutide Inject 0.2 mLs (1.2 mg total) into the skin daily. Inject once daily at the same time       Allergies:  Allergies  Allergen Reactions  . Lipitor [Atorvastatin Calcium] Diarrhea  . Niacin And Related Hives  . Statins Diarrhea  . Macrobid [Nitrofurantoin Macrocrystal] Other (See Comments)    unknown    Past Medical History:  Diagnosis Date  . Anemia   . Diabetes mellitus   . Dyslipidemia   . GERD (gastroesophageal reflux disease)   . Hypertension   . Migraines   . Osteoporosis   . PVD (peripheral vascular disease) ([place])    abi .83 (L), .92 (R)  . RSD (reflex sympathetic dystrophy) 2007   R wrist/hand following fx  . Stroke ([place])   . Varicose veins     Past Surgical History:  Procedure Laterality Date  . ABDOMINAL HYSTERECTOMY  1988  . APPENDECTOMY  1966  . BREAST CYST EXCISION    . CATARACT EXTRACTION  10/2009  . CHOLECYSTECTOMY  1989  . Several benign cyst removed     last 1 in 1972  . TUBAL LIGATION    . UMBILICAL HERNIA REPAIR      Family History  Problem Relation Age of Onset  . Stroke Mother 24  . Hypertension Mother   . Clotting disorder Father   . Heart attack Father   . Arrhythmia Sister   . Stroke  Brother   . Diabetes Neg Hx   . Thyroid disease Neg Hx     Social  History:  reports that she has never smoked. She has never used smokeless tobacco. She reports that she does not drink alcohol or use drugs.    Review of Systems    She has had a TIA and is taking Plavix  Most recent eye exam was In 03/2016    She is on gemfibrozil and low-dose Lovaza also but triglycerides tend to be high, followed by PCP She claimed that she gets diarrhea with all statin drugs, previously on pravastatin       Lab Results  Component Value Date   CHOL 202 (H) 08/27/2016   HDL 32 (L) 08/27/2016   LDLCALC NOT CALC 08/27/2016   LDLDIRECT 41.8 09/13/2011   TRIG 564 (H) 08/27/2016   CHOLHDL 6.3 (H) 08/27/2016                   Thyroid:    On exam has Had a 3 cm nodule on the right side but she has refused to consider biopsy  Lab Results  Component Value Date   TSH 2.00 08/27/2016       The blood pressure has been managed with losartan and metoprolol, Followed by PCP         She has a history of Numbness in her feet and toes.    Last foot exam was in 9/17 showing sensory loss   Physical Examination:  BP (!) 150/82   Pulse 62   Wt 142 lb (64.4 kg)   SpO2 98%   BMI 24.37 kg/m        ASSESSMENT /PLAN:  Diabetes type 2, uncontrolled    See history of present illness for detailed discussion of her current management and problems identified as well as blood sugar patterns  Her A1c is much higher at 9.9 Not clear why her sugars are overall higher and she likely is more insulin deficient She is also having difficulty taking her mealtime insulin consistently Her meals are somewhat erratic although she is trying to do better with cutting back on regular soft drinks However has had readings occasionally over 400 after evening meal indicating need for increased mealtime insulin Also may benefit from a GLP-1 drug since most of her high readings are after meals, this may also help reduce  the fluctuation   HYPERLIPIDEMIA: Poorly controlled  Recommendations:  Although she is a good candidate for a V-go pump she refuses to consider this is despite explaining how this is used and the benefits over injectable insulin Discussed with the patient the nature of GLP-1 drugs, the actions on various organ systems, how they benefit blood glucose control, as well as the benefit of weight loss and  increase satiety . Explained possible side effects especially nausea and vomiting initially; discussed safety information in package insert.  Described the injection technique and dosage titration of Victoza  starting with 0.6 mg once a day at the same time for the first week and then increasing to 1.2 mg if no symptoms of nausea.  Educational brochure on Victoza and co-pay card given  Increase Toujeo by 4 units  Increase suppertime Humalog to 8 units and start 4 units at breakfast  Start monitoring blood sugars in the afternoon more regularly and some after supper also  Needs to take her Humalog consistently before eating  Increase LOVAZA to 4 capsules, may consider adding a statin drug again especially with her history of cerebrovascular disease  Patient Instructions  Check blood sugars on waking up    Also  check blood sugars about 2 hours after a meal and do this after different meals by rotation  Recommended blood sugar levels on waking up is 90-130 and about 2 hours after meal is 130-160  Please bring your blood sugar monitor to each visit, thank you  Humalog 4 at Bfst and 8 at supper  Toujeo 48 units  Start VICTOZA injection as shown once daily at the same time of the day.  Dial the dose to 0.6 mg on the pen for the first week.  You may inject in the stomach, thigh or arm. You may experience nausea in the first few days which usually goes away.  You will feel fullness of the stomach with starting the medication and should try to keep the portions at meals small.   After 1 week  increase the dose by 5 clicks daily if no nausea present.    If any questions or concerns are present call the office or the [place] helpline at [phone]. Visit [email or web address].info/ for more useful information  Lovaza 4 daily     Counseling time on subjects discussed above is over 50% of today's 25 minute visit      Rebecca Perkins 09/08/2016, 2:08 PM   Note: This office note was prepared with Dragon voice recognition system technology. Any transcriptional errors that result from this process are unintentional.

## 2016-09-08 NOTE — Patient Instructions (Addendum)
Check blood sugars on waking up    Also check blood sugars about 2 hours after a meal and do this after different meals by rotation  Recommended blood sugar levels on waking up is 90-130 and about 2 hours after meal is 130-160  Please bring your blood sugar monitor to each visit, thank you  Humalog 4 at Bfst and 8 at supper  Toujeo 48 units  Start VICTOZA injection as shown once daily at the same time of the day.  Dial the dose to 0.6 mg on the pen for the first week.  You may inject in the stomach, thigh or arm. You may experience nausea in the first few days which usually goes away.  You will feel fullness of the stomach with starting the medication and should try to keep the portions at meals small.   After 1 week increase the dose by 5 clicks daily if no nausea present.    If any questions or concerns are present call the office or the [place] helpline at [phone]. Visit [email or web address].[email or web address] for more useful information  Lovaza 4 daily

## 2016-09-10 ENCOUNTER — Encounter: Payer: Medicare Other | Admitting: Dietician

## 2016-09-10 ENCOUNTER — Telehealth: Payer: Self-pay | Admitting: Endocrinology

## 2016-09-10 NOTE — Telephone Encounter (Signed)
Patient son ask you to give him a call on the status of last message.

## 2016-09-10 NOTE — Telephone Encounter (Signed)
Patient son has questions about dosage of medication Victoza.. Please advise

## 2016-09-10 NOTE — Telephone Encounter (Signed)
Spoke with patient's son and he is angry that his mothers prescription was not clearly given to him and he wants to make sure that he doing the right thing- says the paper work given to him states to go up 5 clicks after 1 week and another 5 clicks the next week but he stated it does not state whether he should go up another 5 clicks after that- I read prescription and it states 1.2 mg so I advised patients son of that- please advise

## 2016-09-11 NOTE — Telephone Encounter (Signed)
She will start with 0.6 mg for the first week, then increase 5 clicks for another week and finally take 1.2 mg as on prescription

## 2016-09-13 ENCOUNTER — Telehealth: Payer: Self-pay | Admitting: Endocrinology

## 2016-09-13 NOTE — Telephone Encounter (Signed)
Pt needs her Omega-3 script sent to [facility] on [address] [facility].  She said the doctor increased it to 4x daily and she needs that sent in.

## 2016-09-13 NOTE — Telephone Encounter (Signed)
I contacted the patient and advised of message. She voiced understanding.  

## 2016-09-16 ENCOUNTER — Other Ambulatory Visit: Payer: Self-pay

## 2016-09-16 MED ORDER — OMEGA-3-ACID ETHYL ESTERS 1 G PO CAPS: 3.0000 | ORAL_CAPSULE | Freq: Every day | ORAL | 11 refills | Status: DC

## 2016-09-16 NOTE — Telephone Encounter (Signed)
Ordered 09/16/16

## 2016-09-20 ENCOUNTER — Other Ambulatory Visit: Payer: Self-pay | Admitting: Family Medicine

## 2016-09-30 ENCOUNTER — Ambulatory Visit (INDEPENDENT_AMBULATORY_CARE_PROVIDER_SITE_OTHER): Payer: Medicare Other | Admitting: Endocrinology

## 2016-09-30 ENCOUNTER — Encounter: Payer: Self-pay | Admitting: Endocrinology

## 2016-09-30 VITALS — BP 110/60 | HR 76 | Ht 64.0 in | Wt 140.0 lb

## 2016-09-30 DIAGNOSIS — E1165 Type 2 diabetes mellitus with hyperglycemia: Secondary | ICD-10-CM | POA: Diagnosis not present

## 2016-09-30 DIAGNOSIS — Z794 Long term (current) use of insulin: Secondary | ICD-10-CM | POA: Diagnosis not present

## 2016-09-30 NOTE — Patient Instructions (Addendum)
You can check blood  sugar in the morning every other day Try to check some readings around 10:-11 AM also  TOUJEO: Reduce the dose to 44 units  If her sugars are high after breakfast, at least 180 then start taking 4 units of Humalog at breakfast  VICTOZA: Reduce this down to 0.6 mg for now and take this in the evenings after supper  Next Monday increase the dose by 5 clicks beyond the 0.6 mg dosage and continue this  If having significant nausea with the [id number] extra 5 clicks reduce the dose by 2-3 clicks

## 2016-09-30 NOTE — Telephone Encounter (Signed)
Patient stated she wanted her labs the same day, to hard for her to come twice a week.  Send to [facility]

## 2016-09-30 NOTE — Progress Notes (Signed)
Patient ID: Rebecca Perkins, female   DOB: 04-03-1929, 83 y.o.   MRN: [id number]            Reason for Appointment: Follow-up for Type 2 Diabetes  Referring physician: Pickard  History of Present Illness:          Diagnosis: Type 2 diabetes mellitus, date of diagnosis: 2006       Past history:  At the time of diagnosis she was feeling tired and weak but does not know what her initial blood sugar was. She probably was tried on metformin initially but because of GI side effects discussed not be continued Also did not tolerate Actos because of nausea. She thinks that she was started on insulin within a few months of her initial diagnosis  Not clear what insulin she was trying in the beginning but she thinks it was NovoLog; however for the last few years has been on Lantus only Her blood sugars have been persistently poorly controlled with A1c usually 8-9% about 2011 At initial consultation she was taking 60 units of Lantus insulin without any mealtime coverage Because of her tendency to low blood sugars including overnight she was told to reduce the dose to 50 units  Recent history:   INSULIN regimen is described as: 48 units Toujeo in a.m., Humalog 6-8 units at supper Non-insulin hypoglycemic drugs the patient is taking are:   Victoza 1.2 mg daily in a.m.      On her visit in 3/17 because of her high A1c she was advised to start taking mealtime insulin at suppertime  In 09/2016 because of A1c being 9.9 she was told to start Victoza in addition to her basal bolus insulin regimen  Current management, blood sugars and problems identified:  She has overall much better sugars with starting Victoza, previously they were averaging 229 at home  She did try to increase her dose gradually from 0.6 up to 0.9 and then 1.2 mg daily  She is taking Victoza in the morning  She now says that she is having tendency to nausea in the afternoons and this is mostly with taking this in the afternoon and  also now not able to eat as much  Blood sugars are excellent throughout the day even in the evenings which were previously high.  She thinks she feels little hypoglycemic when her blood sugars are near 100 and this has happened 45 times in the last month now; however not clear why her fasting readings were higher the last 2 mornings  She has back on regular soft drinks and also watching her diet fairly well       Side effects from medications have been: Metformin, Actos: nausea, diarrhea    Compliance with the medical regimen: Fair  Hypoglycemia: None  but she feels a little hypoglycemic when blood sugars are around 80  Glucose monitoring:  done 1-2 times a day         Glucometer: One Touch.      Blood Glucose readings    Mean values apply above for all meters except median for One Touch  PRE-MEAL Fasting Lunch Dinner Bedtime Overall  Glucose range: 77-190    100-192    Mean/median: 107    136  126       Self-care: The diet that the patient has been following is: tries to limit portions .     Meals: 2-3 meals per day. Breakfast is variable, bacon, fruit, egg    Dinner 7 pm  She will snack on peanut butter crackers at lunch Sometimes will Eat a sandwich at lunch            Exercise: a little walking now Dietician visit, most recent none               Weight history: Highest weight has been 170  Wt Readings from Last 3 Encounters:  09/30/16 140 lb (63.5 kg)  09/08/16 142 lb (64.4 kg)  09/06/16 144 lb (65.3 kg)    Glycemic control:   Lab Results  Component Value Date   HGBA1C 9.9 (H) 08/27/2016   HGBA1C 8.1 04/15/2016   HGBA1C 8.5 12/15/2015   Lab Results  Component Value Date   MICROALBUR 1.2 02/10/2016   LDLCALC NOT CALC 08/27/2016   CREATININE 0.89 (H) 08/27/2016    No visits with results within 1 Week(s) from this visit.  Latest known visit with results is:  Appointment on 08/27/2016  Component Date Value Ref Range Status  . Sodium 08/27/2016 143  135 -  146 mmol/L Final  . Potassium 08/27/2016 4.1  3.5 - 5.3 mmol/L Final  . Chloride 08/27/2016 106  98 - 110 mmol/L Final  . CO2 08/27/2016 25  20 - 31 mmol/L Final  . Glucose, Bld 08/27/2016 197* 70 - 99 mg/dL Final  . BUN 08/27/2016 16  7 - 25 mg/dL Final  . Creat 08/27/2016 0.89* 0.60 - 0.88 mg/dL Final   Comment:   For patients > or = 81 years of age: The upper reference limit for Creatinine is approximately 13% higher for people identified as African-American.     . Total Bilirubin 08/27/2016 0.4  0.2 - 1.2 mg/dL Final  . Alkaline Phosphatase 08/27/2016 95  33 - 130 U/L Final  . AST 08/27/2016 18  10 - 35 U/L Final  . ALT 08/27/2016 11  6 - 29 U/L Final  . Total Protein 08/27/2016 6.7  6.1 - 8.1 g/dL Final  . Albumin 08/27/2016 3.8  3.6 - 5.1 g/dL Final  . Calcium 08/27/2016 9.8  8.6 - 10.4 mg/dL Final  . GFR, Est African American 08/27/2016 68  >=60 mL/min Final  . GFR, Est Non African American 08/27/2016 59* >=60 mL/min Final  . TSH 08/27/2016 2.00  mIU/L Final   Comment:   Reference Range   > or = 20 Years  0.40-4.50   Pregnancy Range First trimester  0.26-2.66 Second trimester 0.55-2.73 Third trimester  0.43-2.91     . Cholesterol 08/27/2016 202* <200 mg/dL Final  . Triglycerides 08/27/2016 564* <150 mg/dL Final  . HDL 08/27/2016 32* >50 mg/dL Final  . Total CHOL/HDL Ratio 08/27/2016 6.3* <5.0 Ratio Final  . VLDL 08/27/2016 NOT CALC  <30 mg/dL Final   Comment:   Not calculated due to Triglyceride >400. Suggest ordering Direct LDL (Unit Code: [phone]).   . LDL Cholesterol 08/27/2016 NOT CALC  <100 mg/dL Final   Comment:   Not calculated due to Triglyceride >400. Suggest ordering Direct LDL (Unit Code: [phone]).   . WBC 08/27/2016 5.5  3.8 - 10.8 K/uL Final  . RBC 08/27/2016 4.34  3.80 - 5.10 MIL/uL Final  . Hemoglobin 08/27/2016 12.2  12.0 - 15.0 g/dL Final  . HCT 08/27/2016 36.9  35.0 - 45.0 % Final  . MCV 08/27/2016 85.0  80.0 - 100.0 fL Final  . MCH 08/27/2016  28.1  27.0 - 33.0 pg Final  . MCHC 08/27/2016 33.1  32.0 - 36.0 g/dL Final  . RDW 08/27/2016 12.9  11.0 - 15.0 % Final  . Platelets 08/27/2016 207  140 - 400 K/uL Final  . MPV 08/27/2016 9.7  7.5 - 12.5 fL Final  . Neutro Abs 08/27/2016 2750  1,500 - 7,800 cells/uL Final  . Lymphs Abs 08/27/2016 2035  850 - 3,900 cells/uL Final  . Monocytes Absolute 08/27/2016 495  200 - 950 cells/uL Final  . Eosinophils Absolute 08/27/2016 165  15 - 500 cells/uL Final  . Basophils Absolute 08/27/2016 55  0 - 200 cells/uL Final  . Neutrophils Relative % 08/27/2016 50  % Final  . Lymphocytes Relative 08/27/2016 37  % Final  . Monocytes Relative 08/27/2016 9  % Final  . Eosinophils Relative 08/27/2016 3  % Final  . Basophils Relative 08/27/2016 1  % Final  . Smear Review 08/27/2016 Criteria for review not met   Final  . Hgb A1c MFr Bld 08/27/2016 9.9* <5.7 % Final   Comment:   For someone without known diabetes, a hemoglobin A1c value of 6.5% or greater indicates that they may have diabetes and this should be confirmed with a follow-up test.   For someone with known diabetes, a value <7% indicates that their diabetes is well controlled and a value greater than or equal to 7% indicates suboptimal control. A1c targets should be individualized based on duration of diabetes, age, comorbid conditions, and other considerations.   Currently, no consensus exists for use of hemoglobin A1c for diagnosis of diabetes for children.     . Mean Plasma Glucose 08/27/2016 237  mg/dL Final  . Vit D, 25-Hydroxy 08/27/2016 26* 30 - 100 ng/mL Final   Comment: Vitamin D Status           25-OH Vitamin D        Deficiency                <20 ng/mL        Insufficiency         20 - 29 ng/mL        Optimal             > or = 30 ng/mL   For 25-OH Vitamin D testing on patients on D2-supplementation and patients for whom quantitation of D2 and D3 fractions is required, the [facility] 25-OH VIT D, (D2,D3), LC/MS/MS is  recommended: order code [phone] (patients > 2 yrs).       Allergies as of 09/30/2016      Reactions   Lipitor [atorvastatin Calcium] Diarrhea   Niacin And Related Hives   Statins Diarrhea   Macrobid [nitrofurantoin Macrocrystal] Other (See Comments)   unknown      Medication List       Accurate as of 09/30/16  1:54 PM. Always use your most recent med list.          CALTRATE 600+D PO Take 1 tablet by mouth 2 (two) times daily.   clopidogrel 75 MG tablet Commonly known as:  PLAVIX TAKE 1 TABLET BY MOUTH DAILY   co-enzyme Q-10 30 MG capsule Take 100 mg by mouth 2 (two) times daily.   ferrous sulfate 325 (65 FE) MG tablet TAKE 1 TABLET BY MOUTH THREE TIMES DAILY WITH MEALS   furosemide 40 MG tablet Commonly known as:  LASIX TAKE 1 TABLET BY MOUTH EVERY DAY.   gemfibrozil 600 MG tablet Commonly known as:  LOPID TAKE 1 TABLET BY MOUTH TWICE DAILY BEFORE A MEAL   Insulin Glargine 300 UNIT/ML Sopn Commonly known as:  TOUJEO  SOLOSTAR Inject 46 Units into the skin daily.   insulin lispro 100 UNIT/ML KiwkPen Commonly known as:  HUMALOG KWIKPEN 6-10 units at mealtimes   Insulin Pen Needle 31G X 8 MM Misc Commonly known as:  B-D ULTRAFINE III SHORT PEN USE AS DIRECTED TO INJECT TOUJEO DAILY   INSULIN SYRINGE 1CC/29G 29G X 1/2" 1 ML Misc   INSULIN SYRINGE 1CC/29G 29G X 1/2" 1 ML Misc AS DIRECTED.   losartan 100 MG tablet Commonly known as:  COZAAR TAKE 1 TABLET BY MOUTH EVERY DAY   metoprolol 50 MG tablet Commonly known as:  LOPRESSOR TAKE 1/2 TABLET  BY MOUTH TWICE DAILY   multivitamin tablet Take 1 tablet by mouth daily.   omega-3 acid ethyl esters 1 g capsule Commonly known as:  LOVAZA Take 3 capsules (3 g total) by mouth daily.   omeprazole 20 MG capsule Commonly known as:  PRILOSEC TAKE ONE CAPSULE BY MOUTH DAILY   ONE TOUCH ULTRA SYSTEM KIT w/Device Kit 1 kit by Does not apply route once. Tests Blood sugar before meals and at bedtime   ONE TOUCH  ULTRA TEST test strip Generic drug:  glucose blood TEST BLOOD SUGAR FOUR TIMES DAILY BEFORE MEALS AND AT BEDTIME AS DIRECTED   potassium chloride SA 20 MEQ tablet Commonly known as:  K-DUR,KLOR-CON TAKE 1 TABLET BY MOUTH TWICE DAILY   pyridOXINE 100 MG tablet Commonly known as:  VITAMIN B-6 Take 100 mg by mouth daily.   VICTOZA 18 MG/3ML Sopn Generic drug:  liraglutide Inject 0.2 mLs (1.2 mg total) into the skin daily. Inject once daily at the same time       Allergies:  Allergies  Allergen Reactions  . Lipitor [Atorvastatin Calcium] Diarrhea  . Niacin And Related Hives  . Statins Diarrhea  . Macrobid [Nitrofurantoin Macrocrystal] Other (See Comments)    unknown    Past Medical History:  Diagnosis Date  . Anemia   . Diabetes mellitus   . Dyslipidemia   . GERD (gastroesophageal reflux disease)   . Hypertension   . Migraines   . Osteoporosis   . PVD (peripheral vascular disease) ([place])    abi .83 (L), .92 (R)  . RSD (reflex sympathetic dystrophy) 2007   R wrist/hand following fx  . Stroke ([place])   . Varicose veins     Past Surgical History:  Procedure Laterality Date  . ABDOMINAL HYSTERECTOMY  1988  . APPENDECTOMY  1966  . BREAST CYST EXCISION    . CATARACT EXTRACTION  10/2009  . CHOLECYSTECTOMY  1989  . Several benign cyst removed     last 1 in 1972  . TUBAL LIGATION    . UMBILICAL HERNIA REPAIR      Family History  Problem Relation Age of Onset  . Stroke Mother 16  . Hypertension Mother   . Clotting disorder Father   . Heart attack Father   . Arrhythmia Sister   . Stroke Brother   . Diabetes Neg Hx   . Thyroid disease Neg Hx     Social History:  reports that she has never smoked. She has never used smokeless tobacco. She reports that she does not drink alcohol or use drugs.    Review of Systems    She has had a TIA and is taking Plavix  Most recent eye exam was In 03/2016    She is on gemfibrozil and low-dose Lovaza also but triglycerides  tend to be high, followed by PCP She claimed that she  gets diarrhea with all statin drugs, previously on pravastatin Recently her Lovaza was increased up to 4 capsules a day       Lab Results  Component Value Date   CHOL 202 (H) 08/27/2016   HDL 32 (L) 08/27/2016   LDLCALC NOT CALC 08/27/2016   LDLDIRECT 41.8 09/13/2011   TRIG 564 (H) 08/27/2016   CHOLHDL 6.3 (H) 08/27/2016                   Thyroid:    On exam has Had a 3 cm nodule on the right side but she has refused to consider biopsy  Lab Results  Component Value Date   TSH 2.00 08/27/2016       The blood pressure has been managed with losartan and metoprolol, Followed by PCP         She has a history of Numbness in her feet and toes.    Last foot exam was in 9/17 showing sensory loss   Physical Examination:  BP 110/60   Pulse 76   Ht _0  (1.626 m)   Wt 140 lb (63.5 kg)   SpO2 94%   BMI 24.03 kg/m        ASSESSMENT /PLAN:  Diabetes type 2, uncontrolled    See history of present illness for detailed discussion of her current management and problems identified as well as blood sugar patterns  Her blood sugars are significantly better with adding Victoza Her Toujeo insulin dose was increased only 4 units in the morning otherwise mealtime insulin was unchanged. With this she is cutting back on portions but also is having some degree of nausea in the afternoons with taking Victoza in the morning  HYPERLIPIDEMIA: She would improve with adding full dose Lovaza and also losing weight and improving blood sugars  Recommendations:  She will cut back to 0.6 mg Victoza again and I taking this in the evening at suppertime  She will increase this back to 0.9 mg and only increase further if there is no nausea, detailed instructions given  Cut back Toujeo back to 44 again  Patient Instructions  You can check blood  sugar in the morning every other day Try to check some readings around 10:-11 AM also  TOUJEO:  Reduce the dose to 44 units  If her sugars are high after breakfast, at least 180 then start taking 4 units of Humalog at breakfast  VICTOZA: Reduce this down to 0.6 mg for now and take this in the evenings after supper  Next Monday increase the dose by 5 clicks beyond the 0.6 mg dosage and continue this  If having significant nausea with the 3.3+ extra 5 clicks reduce the dose by 2-3 clicks       Jacobe Study 09/30/2016, 1:54 PM   Note: This office note was prepared with Estate agent. Any transcriptional errors that result from this process are unintentional.

## 2016-09-30 NOTE — Telephone Encounter (Signed)
ok 

## 2016-10-06 ENCOUNTER — Other Ambulatory Visit: Payer: Self-pay | Admitting: Family Medicine

## 2016-10-07 ENCOUNTER — Other Ambulatory Visit: Payer: Self-pay | Admitting: Endocrinology

## 2016-10-11 ENCOUNTER — Telehealth: Payer: Self-pay | Admitting: Endocrinology

## 2016-10-11 NOTE — Telephone Encounter (Signed)
[facility] Call: Caller is from [employer], has question for doctor about Rx.

## 2016-11-24 ENCOUNTER — Encounter: Payer: Self-pay | Admitting: Podiatry

## 2016-11-24 ENCOUNTER — Ambulatory Visit (INDEPENDENT_AMBULATORY_CARE_PROVIDER_SITE_OTHER): Payer: Medicare Other | Admitting: Podiatry

## 2016-11-24 DIAGNOSIS — M79676 Pain in unspecified toe(s): Secondary | ICD-10-CM | POA: Diagnosis not present

## 2016-11-24 DIAGNOSIS — B351 Tinea unguium: Secondary | ICD-10-CM

## 2016-11-24 NOTE — Progress Notes (Signed)
Subjective:    Patient ID: Rebecca Perkins, female   DOB: 83 y.o.   MRN: [id number]   HPI patient presents with painful nails 1-5 both feet that are thick and incurvated    ROS      Objective:  Physical Exam No change neurovascular with thick yellow brittle nailbeds 1-5 both feet that are painful    Assessment:     Mycotic nail infection 1-5 both feet with pain    Plan:     Debris painful nailbeds 1-5 both feet with no iatrogenic bleeding noted

## 2016-12-01 ENCOUNTER — Ambulatory Visit: Payer: Medicare Other | Admitting: Endocrinology

## 2016-12-07 ENCOUNTER — Ambulatory Visit (INDEPENDENT_AMBULATORY_CARE_PROVIDER_SITE_OTHER): Payer: Medicare Other | Admitting: Endocrinology

## 2016-12-07 ENCOUNTER — Encounter: Payer: Self-pay | Admitting: Endocrinology

## 2016-12-07 VITALS — BP 120/78 | HR 62 | Ht 64.0 in | Wt 139.4 lb

## 2016-12-07 DIAGNOSIS — E1165 Type 2 diabetes mellitus with hyperglycemia: Secondary | ICD-10-CM

## 2016-12-07 DIAGNOSIS — Z794 Long term (current) use of insulin: Secondary | ICD-10-CM | POA: Diagnosis not present

## 2016-12-07 LAB — POCT GLYCOSYLATED HEMOGLOBIN (HGB A1C): Hemoglobin A1C: 7.8

## 2016-12-07 MED ORDER — DULAGLUTIDE 0.75 MG/0.5ML ~~LOC~~ SOAJ
SUBCUTANEOUS | 0 refills | Status: DC
Start: 2016-12-07 — End: 2017-01-07

## 2016-12-07 NOTE — Progress Notes (Signed)
Patient ID: Rebecca Perkins, female   DOB: 12/24/1928, 83 y.o.   MRN: [id number]            Reason for Appointment: Follow-up for Type 2 Diabetes  Referring physician: Pickard  History of Present Illness:          Diagnosis: Type 2 diabetes mellitus, date of diagnosis: 2006       Past history:  At the time of diagnosis she was feeling tired and weak but does not know what her initial blood sugar was. She probably was tried on metformin initially but because of GI side effects discussed not be continued Also did not tolerate Actos because of nausea. She thinks that she was started on insulin within a few months of her initial diagnosis  Not clear what insulin she was trying in the beginning but she thinks it was NovoLog; however for the last few years has been on Lantus only Her blood sugars have been persistently poorly controlled with A1c usually 8-9% about 2011 At initial consultation she was taking 60 units of Lantus insulin without any mealtime coverage On her visit in 3/17 because of her high A1c she was advised to start taking mealtime insulin at suppertime   Recent history:   INSULIN regimen is described as: 44 units Toujeo in a.m., Humalog 6-8 units at supper Non-insulin hypoglycemic drugs the patient is taking are:   none     In 09/2016 because of A1c being 9.9 she was told to start Victoza in addition to her basal bolus insulin regimen She is not taking this since her last visit in 3/18  Today A1c is 7.8  Current management, blood sugars and problems identified:  She did have overall much better sugars with starting Victoza, however she started having nausea and on her last visit she was told to reduce her dose somewhat but she thinks that even with 0.6 mg at night she was having persistent nausea and stopped her medication a couple of weeks after her visit in March and did not notify us  Again she is having mostly high blood sugars after meals at night, highest  379  However she has not checked readings after evening meal since 4/25  She is sometimes eating sweets or more carbohydrates causing the sugars to be higher also  She does not think she is getting regular soft drinks now  FASTING blood sugars are variable although reasonably good on an average without overnight hypoglycemia  She is asking about getting more fruit at times       Side effects from medications have been: Metformin, Actos: nausea, diarrhea    Compliance with the medical regimen: Fair  Hypoglycemia: None  but she feels a little hypoglycemic when blood sugars are around 80  Glucose monitoring:  done 1-2 times a day         Glucometer: One Touch.      Blood Glucose readings    Mean values apply above for all meters except median for One Touch  PRE-MEAL Fasting Lunch Dinner Bedtime Overall  Glucose range:  79-272    149-379    Mean/median: 134    300  147      Self-care: The diet that the patient has been following is: tries to limit portions .     Meals: 2-3 meals per day. Breakfast is variable, bacon, fruit, egg    Dinner 7 pm She will snack on peanut butter crackers at lunch Sometimes will Eat a sandwich   at lunch            Exercise: walking now Dietician visit, most recent none               Weight history: Highest weight has been 170  Wt Readings from Last 3 Encounters:  12/07/16 139 lb 6.4 oz (63.2 kg)  09/30/16 140 lb (63.5 kg)  09/08/16 142 lb (64.4 kg)    Glycemic control:   Lab Results  Component Value Date   HGBA1C 7.8 12/07/2016   HGBA1C 9.9 (H) 08/27/2016   HGBA1C 8.1 04/15/2016   Lab Results  Component Value Date   MICROALBUR 1.2 02/10/2016   LDLCALC NOT CALC 08/27/2016   CREATININE 0.89 (H) 08/27/2016    Office Visit on 12/07/2016  Component Date Value Ref Range Status  . Hemoglobin A1C 12/07/2016 7.8   Final      Allergies as of 12/07/2016      Reactions   Lipitor [atorvastatin Calcium] Diarrhea   Niacin And Related Hives    Statins Diarrhea   Macrobid [nitrofurantoin Macrocrystal] Other (See Comments)   unknown      Medication List       Accurate as of 12/07/16  9:20 PM. Always use your most recent med list.          CALTRATE 600+D PO Take 1 tablet by mouth 2 (two) times daily.   clopidogrel 75 MG tablet Commonly known as:  PLAVIX TAKE 1 TABLET BY MOUTH DAILY   co-enzyme Q-10 30 MG capsule Take 100 mg by mouth 2 (two) times daily.   Dulaglutide 0.75 MG/0.5ML Sopn Commonly known as:  TRULICITY Inject in the abdominal skin as directed once a week   ferrous sulfate 325 (65 FE) MG tablet TAKE 1 TABLET BY MOUTH THREE TIMES DAILY WITH MEALS   furosemide 40 MG tablet Commonly known as:  LASIX TAKE 1 TABLET BY MOUTH EVERY DAY.   gemfibrozil 600 MG tablet Commonly known as:  LOPID TAKE 1 TABLET BY MOUTH TWICE DAILY BEFORE A MEAL   insulin lispro 100 UNIT/ML KiwkPen Commonly known as:  HUMALOG KWIKPEN 6-10 units at mealtimes   Insulin Pen Needle 31G X 8 MM Misc Commonly known as:  B-D ULTRAFINE III SHORT PEN USE AS DIRECTED TO INJECT TOUJEO DAILY   INSULIN SYRINGE 1CC/29G 29G X 1/2" 1 ML Misc   INSULIN SYRINGE 1CC/29G 29G X 1/2" 1 ML Misc AS DIRECTED.   losartan 100 MG tablet Commonly known as:  COZAAR TAKE 1 TABLET BY MOUTH EVERY DAY   metoprolol 50 MG tablet Commonly known as:  LOPRESSOR TAKE 1/2 TABLET  BY MOUTH TWICE DAILY   multivitamin tablet Take 1 tablet by mouth daily.   omega-3 acid ethyl esters 1 g capsule Commonly known as:  LOVAZA Take 3 capsules (3 g total) by mouth daily.   omeprazole 20 MG capsule Commonly known as:  PRILOSEC TAKE ONE CAPSULE BY MOUTH DAILY   ONE TOUCH ULTRA SYSTEM KIT w/Device Kit 1 kit by Does not apply route once. Tests Blood sugar before meals and at bedtime   ONE TOUCH ULTRA TEST test strip Generic drug:  glucose blood USE FOUR TIMES DAILY BEFORE MEALS AND EVERY NIGHT AT BEDTIME   potassium chloride SA 20 MEQ tablet Commonly known  as:  K-DUR,KLOR-CON TAKE 1 TABLET BY MOUTH TWICE DAILY   pyridOXINE 100 MG tablet Commonly known as:  VITAMIN B-6 Take 100 mg by mouth daily.   TOUJEO SOLOSTAR 300 UNIT/ML Sopn Generic drug:  Insulin Glargine INJECT 46 UNITS UNDER THE SKIN DAILY   VICTOZA 18 MG/3ML Sopn Generic drug:  liraglutide Inject 0.2 mLs (1.2 mg total) into the skin daily. Inject once daily at the same time       Allergies:  Allergies  Allergen Reactions  . Lipitor [Atorvastatin Calcium] Diarrhea  . Niacin And Related Hives  . Statins Diarrhea  . Macrobid [Nitrofurantoin Macrocrystal] Other (See Comments)    unknown    Past Medical History:  Diagnosis Date  . Anemia   . Diabetes mellitus   . Dyslipidemia   . GERD (gastroesophageal reflux disease)   . Hypertension   . Migraines   . Osteoporosis   . PVD (peripheral vascular disease) ([place])    abi .83 (L), .92 (R)  . RSD (reflex sympathetic dystrophy) 2007   R wrist/hand following fx  . Stroke ([place])   . Varicose veins     Past Surgical History:  Procedure Laterality Date  . ABDOMINAL HYSTERECTOMY  1988  . APPENDECTOMY  1966  . BREAST CYST EXCISION    . CATARACT EXTRACTION  10/2009  . CHOLECYSTECTOMY  1989  . Several benign cyst removed     last 1 in 1972  . TUBAL LIGATION    . UMBILICAL HERNIA REPAIR      Family History  Problem Relation Age of Onset  . Stroke Mother 36  . Hypertension Mother   . Clotting disorder Father   . Heart attack Father   . Arrhythmia Sister   . Stroke Brother   . Diabetes Neg Hx   . Thyroid disease Neg Hx     Social History:  reports that she has never smoked. She has never used smokeless tobacco. She reports that she does not drink alcohol or use drugs.    Review of Systems    She has had a TIA and is taking Plavix  Most recent eye exam was In 03/2016    She is on gemfibrozil and low-dose Lovaza also but triglycerides tend to be high, followed by PCP She claimed that she gets diarrhea with  all statin drugs, previously on pravastatin Lovaza was increased up to 4 capsules a day       Lab Results  Component Value Date   CHOL 202 (H) 08/27/2016   HDL 32 (L) 08/27/2016   LDLCALC NOT CALC 08/27/2016   LDLDIRECT 41.8 09/13/2011   TRIG 564 (H) 08/27/2016   CHOLHDL 6.3 (H) 08/27/2016                   Thyroid:    On exam has Had a 3 cm nodule on the right side but she has refused to consider biopsy  Lab Results  Component Value Date   TSH 2.00 08/27/2016       The blood pressure has been managed with losartan and metoprolol, Followed by PCP         She has a history of Numbness in her feet and toes From diabetic neuropathy.    Last foot exam was in 9/17 showing sensory loss   Physical Examination:  BP 120/78   Pulse 62   Ht 5' 4" (1.626 m)   Wt 139 lb 6.4 oz (63.2 kg)   SpO2 97%   BMI 23.93 kg/m        ASSESSMENT /PLAN:  Diabetes type 2, uncontrolled    See history of present illness for detailed discussion of her current management and problems identified as well as blood  sugar patterns  Her postprandial readings have gone up significantly with stopping Victoza Although her A1c is only 7.8 she is usually getting fairly good readings in the morning but most likely has consistently high postprandial readings with blood sugars as high as 379 She thinks she is taking Humalog consistently at suppertime but likely not taking enough most of the time  HYPERLIPIDEMIA: Needs fasting labs  Recommendations: Discussed with the patient the nature of GLP-1 drugs, the action on various organ systems, how they benefit blood glucose control, as well as the benefit of weight loss and  increase satiety . Explained possible side effects, particularly nausea and vomiting that usually resolve over time; discussed safety information in package insert.  Demonstrated the medication injection device and injection technique to the patient.  Showed patient where to inject the  medication. To start with 0.75 mg dosage weekly for the first 4 weeks  Follow-up in 4 weeks  Reduce Toujeo to at least 42 units  She was reminded of the need to check her sugars at night after supper and some after breakfast also  May need to adjust her Humalog again based on postprandial readings  If not able to get enough readings will do a professional CGM  Patient Instructions  Start TRULICITYwith the pen as shown once weekly on the same day of the week.   You may inject in the stomach, thigh or arm  You will feel fullness of the stomach with starting the medication and should try to keep the portions at meals small.  You may experience nausea in the first few days which usually gets better over time    If any questions or concerns are present call the office or the  [facility] at [phone]. Also visit [email or web address].com website for more useful information  Reduce Toujeo to 42 units  If not taking Trulicity use 6-8 humalog at supper  Check blood sugars on waking up    Also check blood sugars about 2-3 hours after a meal and do this after different meals by rotation  Recommended blood sugar levels on waking up is 90-130 and about 2 hours after meal is 130-160  Please bring your blood sugar monitor to each visit, thank you        Counseling time on subjects discussed above is over 50% of today's 25 minute visit    , 12/07/2016, 9:20 PM   Note: This office note was prepared with Dragon voice recognition system technology. Any transcriptional errors that result from this process are unintentional.  

## 2016-12-07 NOTE — Patient Instructions (Signed)
Start TRULICITYwith the pen as shown once weekly on the same day of the week.   You may inject in the stomach, thigh or arm  You will feel fullness of the stomach with starting the medication and should try to keep the portions at meals small.  You may experience nausea in the first few days which usually gets better over time    If any questions or concerns are present call the office or the  [facility] at [phone]. Also visit [email or web address].com website for more useful information  Reduce Toujeo to 42 units  If not taking Trulicity use 6-8 humalog at supper  Check blood sugars on waking up    Also check blood sugars about 2-3 hours after a meal and do this after different meals by rotation  Recommended blood sugar levels on waking up is 90-130 and about 2 hours after meal is 130-160  Please bring your blood sugar monitor to each visit, thank you

## 2016-12-30 ENCOUNTER — Encounter: Payer: Self-pay | Admitting: Family Medicine

## 2016-12-30 ENCOUNTER — Ambulatory Visit (INDEPENDENT_AMBULATORY_CARE_PROVIDER_SITE_OTHER): Payer: Medicare Other | Admitting: Family Medicine

## 2016-12-30 VITALS — BP 132/68 | HR 78 | Temp 97.9°F | Resp 16 | Ht 64.0 in | Wt 138.0 lb

## 2016-12-30 DIAGNOSIS — R42 Dizziness and giddiness: Secondary | ICD-10-CM

## 2016-12-30 DIAGNOSIS — G4452 New daily persistent headache (NDPH): Secondary | ICD-10-CM | POA: Diagnosis not present

## 2016-12-30 DIAGNOSIS — Z8673 Personal history of transient ischemic attack (TIA), and cerebral infarction without residual deficits: Secondary | ICD-10-CM | POA: Diagnosis not present

## 2016-12-30 NOTE — Progress Notes (Signed)
Subjective:    Patient ID: Rebecca Perkins, female    DOB: July 27, 1929, 83 y.o.   MRN: [id number]  HPI 01/16/15 Patient reports one year of episodic dizziness. The dizziness sounds more like disequilibrium. She feels lightheaded. There is a rocking sensation to her surroundings. She denies any true vertigo. She denies any vision changes. She denies any diplopia. She denies any severe headache. She denies any neurologic deficits. She denies any slurring speech or numbness or paralysis or weakness in extremity. She denies any head trauma. She denies any tinnitus or hearing loss.  The dizziness occurs and lasts for several minutes. There are no exacerbating or alleviating factors although it does seem to happen more often when she standing. Recently at home her blood pressures have been low in the door will. She attributes the dizziness to this.  At that time my plan was: I explained to the patient that this is likely multifactorial and related to age, peripheral neuropathy, low blood pressure, medications. I doubt serious intracranial pathology given her history and are normally review of systems. I believe the likelihood of a intracranial mass or CVA is very low. There are no symptoms of syncope. I have asked the patient to temporarily discontinue amlodipine to raise her blood pressure and then recheck in 2 weeks. If her symptoms are worsening or not improving, at that time we can check a head CT  02/06/15 Patient is here for follow up. Seems to be doing better since discontinuing the amlodipine. Her blood pressure has been ranging 110-130/60-80. This is off amlodipine. The dizziness has not been as significant. She is not falling. She does occasionally have some vertigo with rapid turning of her head which is also contributing. At the present time she is not interested in a CT scan or MRI. She is also not interested in physical therapy for the dizziness. She does have a history of osteoporosis with a T  score of -2.5 in her left forearm. She is overdue to recheck a bone density test.  At that time, my plan was: dizziness is multifactorial and improved. Continue to refrain from amlodipine. If the dizziness worsens, I would recommend physical therapy through [facility] neurology. I will schedule the patient for a bone density test to monitor her osteoporosis and determine if she would benefit from bisphosphonate therapy  12/30/16 Patient states that the dizziness never truly resolved. In fact, over the last few months it has worsened. She reports that the room spins with any position change. This seems to be occurring more frequently. She also reports worsening headache. The headache is located over her left temple. It is a pressure-like headache and seems to be associated with the vertigo. She denies any hearing loss. She denies any tinnitus. She had an MRI of the brain performed in 2016 which revealed 2 old left-sided lacunar infarcts in the basal ganglia and in the thalamus but no space-occupying lesion in the brain. Headaches and vertigo have worsened since that MRI was performed. Today on examination, Dix-Hallpike maneuver is negative bilaterally. Cranial nerves II through XII are grossly intact muscle strength 5 over 5 equal and symmetric in the upper and lower extremities. Vertigo seems to be a chronic symptom for the patient however I'm concerned by the association now with chronic left-sided daily headache Past Medical History:  Diagnosis Date  . Anemia   . Diabetes mellitus   . Dyslipidemia   . GERD (gastroesophageal reflux disease)   . Hypertension   . Migraines   .  Osteoporosis   . PVD (peripheral vascular disease) ([place])    abi .83 (L), .92 (R)  . RSD (reflex sympathetic dystrophy) 2007   R wrist/hand following fx  . Stroke ([place])   . Varicose veins    Past Surgical History:  Procedure Laterality Date  . ABDOMINAL HYSTERECTOMY  1988  . APPENDECTOMY  1966  . BREAST CYST EXCISION    .  CATARACT EXTRACTION  10/2009  . CHOLECYSTECTOMY  1989  . Several benign cyst removed     last 1 in 1972  . TUBAL LIGATION    . UMBILICAL HERNIA REPAIR     Current Outpatient Prescriptions on File Prior to Visit  Medication Sig Dispense Refill  . Blood Glucose Monitoring Suppl (ONE TOUCH ULTRA SYSTEM KIT) W/DEVICE KIT 1 kit by Does not apply route once. Tests Blood sugar before meals and at bedtime 1 each 0  . Calcium Carbonate-Vitamin D (CALTRATE 600+D PO) Take 1 tablet by mouth 2 (two) times daily.    . clopidogrel (PLAVIX) 75 MG tablet TAKE 1 TABLET BY MOUTH DAILY 30 tablet 11  . co-enzyme Q-10 30 MG capsule Take 100 mg by mouth 2 (two) times daily.    . Dulaglutide (TRULICITY) 8.67 EH/2.0NO SOPN Inject in the abdominal skin as directed once a week 4 pen 0  . ferrous sulfate 325 (65 FE) MG tablet TAKE 1 TABLET BY MOUTH THREE TIMES DAILY WITH MEALS (Patient taking differently: TAKE 1 TABLET BY MOUTH TWO TIMES DAILY WITH MEALS) 90 tablet 4  . furosemide (LASIX) 40 MG tablet TAKE 1 TABLET BY MOUTH EVERY DAY. 30 tablet 11  . gemfibrozil (LOPID) 600 MG tablet TAKE 1 TABLET BY MOUTH TWICE DAILY BEFORE A MEAL 60 tablet 11  . insulin lispro (HUMALOG KWIKPEN) 100 UNIT/ML KiwkPen 6-10 units at mealtimes (Patient taking differently: 6-10 units at dinner time) 15 mL 1  . Insulin Pen Needle (B-D ULTRAFINE III SHORT PEN) 31G X 8 MM MISC USE AS DIRECTED TO INJECT TOUJEO DAILY 100 each 5  . INSULIN SYRINGE 1CC/29G 29G X 1/2" 1 ML MISC   6  . INSULIN SYRINGE 1CC/29G 29G X 1/2" 1 ML MISC AS DIRECTED. 100 each 3  . losartan (COZAAR) 100 MG tablet TAKE 1 TABLET BY MOUTH EVERY DAY 30 tablet 11  . metoprolol (LOPRESSOR) 50 MG tablet TAKE 1/2 TABLET  BY MOUTH TWICE DAILY 180 tablet 0  . Multiple Vitamin (MULTIVITAMIN) tablet Take 1 tablet by mouth daily.      Marland Kitchen omega-3 acid ethyl esters (LOVAZA) 1 g capsule Take 3 capsules (3 g total) by mouth daily. (Patient taking differently: Take 4 capsules by mouth daily.  Takes 2 in am and 2 in the pm) 60 capsule 11  . omeprazole (PRILOSEC) 20 MG capsule TAKE ONE CAPSULE BY MOUTH DAILY 30 capsule 11  . ONE TOUCH ULTRA TEST test strip USE FOUR TIMES DAILY BEFORE MEALS AND EVERY NIGHT AT BEDTIME 150 each 5  . potassium chloride SA (K-DUR,KLOR-CON) 20 MEQ tablet TAKE 1 TABLET BY MOUTH TWICE DAILY 180 tablet 0  . Pyridoxine HCl (VITAMIN B-6) 100 MG tablet Take 100 mg by mouth daily.      Nelva Nay SOLOSTAR 300 UNIT/ML SOPN INJECT 46 UNITS UNDER THE SKIN DAILY (Patient taking differently: INJECT 42 UNITS UNDER THE SKIN DAILY) 9 pen 1   No current facility-administered medications on file prior to visit.    Allergies  Allergen Reactions  . Lipitor [Atorvastatin Calcium] Diarrhea  . Niacin And  Related Hives  . Statins Diarrhea  . Macrobid [Nitrofurantoin Macrocrystal] Other (See Comments)    unknown   Social History   Social History  . Marital status: Widowed    Spouse name: N/A  . Number of children: 3  . Years of education: N/A   Occupational History  . Retired    Social History Main Topics  . Smoking status: Never Smoker  . Smokeless tobacco: Never Used  . Alcohol use No  . Drug use: No  . Sexual activity: Not on file   Other Topics Concern  . 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  All other systems reviewed and are negative.      Objective:   Physical Exam  Constitutional: She is oriented to person, place, and time. She appears well-developed and well-nourished.  HENT:  Right Ear: Tympanic membrane and ear canal normal.  Left Ear: Tympanic membrane and ear canal normal.  Cardiovascular: Normal rate, regular rhythm and normal heart sounds.   No murmur heard. Pulmonary/Chest: Effort normal and breath sounds normal. No respiratory distress. She  has no wheezes. She has no rales.  Abdominal: Soft. Bowel sounds are normal. She exhibits no distension. There is no tenderness. There is no rebound and no guarding.  Neurological: She is alert and oriented to person, place, and time. She has normal reflexes. No cranial nerve deficit. She exhibits normal muscle tone. Coordination normal.  Vitals reviewed.         Assessment & Plan:  NPDH (new persistent daily headache) - Plan: MR Brain W Wo Contrast  Vertigo - Plan: MR Brain W Wo Contrast  History of CVA (cerebrovascular accident) - Plan: MR Brain W Wo Contrast Given the persistence of the symptoms (dizziness and vertigo), the new worsening daily headache, I will proceed with an MRI of the brain to rule out an acoustic neuroma or posterior infarct in the cerebellum as a cause of her vertigo. Depending upon the results of MRI, the patient may need physical therapy for vestibular rehabilitation/Epley maneuvers.  Await results of MRI.

## 2017-01-07 ENCOUNTER — Other Ambulatory Visit: Payer: Self-pay | Admitting: Endocrinology

## 2017-01-13 ENCOUNTER — Ambulatory Visit (HOSPITAL_COMMUNITY)
Admission: RE | Admit: 2017-01-13 | Discharge: 2017-01-13 | Disposition: A | Discharge status: Home / Self Care | Payer: Medicare Other | Source: Ambulatory Visit | Attending: Family Medicine | Admitting: Family Medicine

## 2017-01-13 DIAGNOSIS — G319 Degenerative disease of nervous system, unspecified: Secondary | ICD-10-CM | POA: Insufficient documentation

## 2017-01-13 DIAGNOSIS — I6782 Cerebral ischemia: Secondary | ICD-10-CM | POA: Diagnosis not present

## 2017-01-13 DIAGNOSIS — I69018 Other symptoms and signs involving cognitive functions following nontraumatic subarachnoid hemorrhage: Secondary | ICD-10-CM | POA: Insufficient documentation

## 2017-01-13 DIAGNOSIS — I10 Essential (primary) hypertension: Secondary | ICD-10-CM | POA: Diagnosis not present

## 2017-01-13 DIAGNOSIS — G4452 New daily persistent headache (NDPH): Secondary | ICD-10-CM

## 2017-01-13 DIAGNOSIS — R42 Dizziness and giddiness: Secondary | ICD-10-CM

## 2017-01-13 DIAGNOSIS — Z8673 Personal history of transient ischemic attack (TIA), and cerebral infarction without residual deficits: Secondary | ICD-10-CM

## 2017-01-13 LAB — CREATININE, SERUM
Creatinine, Ser: 1.55 mg/dL — ABNORMAL HIGH (ref 0.44–1.00)
GFR calc Af Amer: 34 mL/min — ABNORMAL LOW (ref >60)
GFR calc non Af Amer: 29 mL/min — ABNORMAL LOW (ref >60)

## 2017-01-13 MED ORDER — GADOBENATE DIMEGLUMINE 529 MG/ML IV SOLN
7.0000 mL | Freq: Once | INTRAVENOUS | Status: AC | PRN
  Administered 2017-01-13: 7 mL via INTRAVENOUS

## 2017-01-14 ENCOUNTER — Other Ambulatory Visit: Payer: Self-pay | Admitting: Family Medicine

## 2017-01-14 DIAGNOSIS — R42 Dizziness and giddiness: Secondary | ICD-10-CM

## 2017-01-14 DIAGNOSIS — R269 Unspecified abnormalities of gait and mobility: Secondary | ICD-10-CM

## 2017-01-18 ENCOUNTER — Ambulatory Visit (INDEPENDENT_AMBULATORY_CARE_PROVIDER_SITE_OTHER): Payer: Medicare Other | Admitting: Endocrinology

## 2017-01-18 ENCOUNTER — Encounter: Payer: Self-pay | Admitting: Endocrinology

## 2017-01-18 VITALS — BP 116/68 | HR 71 | Ht 64.0 in | Wt 138.0 lb

## 2017-01-18 DIAGNOSIS — E1165 Type 2 diabetes mellitus with hyperglycemia: Secondary | ICD-10-CM

## 2017-01-18 DIAGNOSIS — N289 Disorder of kidney and ureter, unspecified: Secondary | ICD-10-CM | POA: Diagnosis not present

## 2017-01-18 DIAGNOSIS — Z794 Long term (current) use of insulin: Secondary | ICD-10-CM | POA: Diagnosis not present

## 2017-01-18 DIAGNOSIS — I951 Orthostatic hypotension: Secondary | ICD-10-CM

## 2017-01-18 LAB — BASIC METABOLIC PANEL
BUN: 38 mg/dL — ABNORMAL HIGH (ref 6–23)
CO2: 26 mEq/L (ref 19–32)
Calcium: 10.1 mg/dL (ref 8.4–10.5)
Chloride: 104 mEq/L (ref 96–112)
Creatinine, Ser: 1.3 mg/dL — ABNORMAL HIGH (ref 0.40–1.20)
GFR: 41.31 mL/min — ABNORMAL LOW (ref >60.00)
Glucose, Bld: 222 mg/dL — ABNORMAL HIGH (ref 70–99)
Potassium: 4.5 mEq/L (ref 3.5–5.1)
Sodium: 139 mEq/L (ref 135–145)

## 2017-01-18 NOTE — Progress Notes (Signed)
Patient ID: Rebecca Perkins, female   DOB: 1929-03-18, 83 y.o.   MRN: [id number]            Reason for Appointment: Follow-up for Type 2 Diabetes  Referring physician: Pickard  History of Present Illness:          Diagnosis: Type 2 diabetes mellitus, date of diagnosis: 2006       Past history:  At the time of diagnosis she was feeling tired and weak but does not know what her initial blood sugar was. She probably was tried on metformin initially but because of GI side effects discussed not be continued Also did not tolerate Actos because of nausea. She thinks that she was started on insulin within a few months of her initial diagnosis  Not clear what insulin she was trying in the beginning but she thinks it was NovoLog; however for the last few years has been on Lantus only Her blood sugars have been persistently poorly controlled with A1c usually 8-9% about 2011 At initial consultation she was taking 60 units of Lantus insulin without any mealtime coverage On her visit in 3/17 because of her high A1c she was advised to start taking mealtime insulin at suppertime  In 09/2016 because of A1c being 9.9 she was told to start Victoza in addition to her basal bolus insulin regimen  Recent history:   INSULIN regimen is described as: 42 units Toujeo in a.m., Humalog 6-8 units at supper Non-insulin hypoglycemic drugs the patient is taking are:   Trulicity 3.30 mg weekly     Her last A1c is 7.8  Current management, blood sugars and problems identified:   She did have overall much better sugars with Victoza but this was stopped because of nausea in March  Since she is very reluctant to take mealtime insulin and also monitor blood sugars after meals she was empirically started on Trulicity instead on her last visit  She does not think she is having nausea with this  Although her weight is about the same not clear if her postprandial readings are better  She does appear to have some  readings over 200 nonfasting still but previously was having readings as high as 379  Her family is still not able to make her compliant with checking her blood sugar AFTER her evening meal  She is also very fixed in her ways about eating and having some regular soft drinks consistently  FASTING blood sugars are variable and for a few days were low normal blood in the last week they have been mildly increased overall       Side effects from medications have been: Metformin, Actos: nausea, diarrhea    Compliance with the medical regimen: Fair  Hypoglycemia: None  but she feels a little hypoglycemic when blood sugars are around 80  Glucose monitoring:  done 1-2 times a day         Glucometer: One Touch.      Blood Glucose readings    FASTING range recently 73-1 96 with median 120 Nonfasting 2 26, 239    Self-care: The diet that the patient has been following is: tries to limit portions .     Meals: 2-3 meals per day. Breakfast is variable, bacon, fruit, egg    Dinner 7 pm She will snack on peanut butter crackers at lunch, sometimes will eat a sandwich at lunch            Exercise: walking occasionally Dietician visit, most recent  none               Weight history: Highest weight has been 170  Wt Readings from Last 3 Encounters:  01/18/17 138 lb (62.6 kg)  12/30/16 138 lb (62.6 kg)  12/07/16 139 lb 6.4 oz (63.2 kg)    Glycemic control:   Lab Results  Component Value Date   HGBA1C 7.8 12/07/2016   HGBA1C 9.9 (H) 08/27/2016   HGBA1C 8.1 04/15/2016   Lab Results  Component Value Date   MICROALBUR 1.2 02/10/2016   LDLCALC NOT CALC 08/27/2016   CREATININE 1.30 (H) 01/18/2017    Office Visit on 01/18/2017  Component Date Value Ref Range Status  . Sodium 01/18/2017 139  135 - 145 mEq/L Final  . Potassium 01/18/2017 4.5  3.5 - 5.1 mEq/L Final  . Chloride 01/18/2017 104  96 - 112 mEq/L Final  . CO2 01/18/2017 26  19 - 32 mEq/L Final  . Glucose, Bld 01/18/2017 222* 70 -  99 mg/dL Final  . BUN 01/18/2017 38* 6 - 23 mg/dL Final  . Creatinine, Ser 01/18/2017 1.30* 0.40 - 1.20 mg/dL Final  . Calcium 01/18/2017 10.1  8.4 - 10.5 mg/dL Final  . GFR 01/18/2017 41.31* >60.00 mL/min Final  Hospital Outpatient Visit on 01/13/2017  Component Date Value Ref Range Status  . Creatinine, Ser 01/13/2017 1.55* 0.44 - 1.00 mg/dL Final  . GFR calc non Af Amer 01/13/2017 29* >60 mL/min Final  . GFR calc Af Amer 01/13/2017 34* >60 mL/min Final   Comment: (NOTE) The eGFR has been calculated using the CKD EPI equation. This calculation has not been validated in all clinical situations. eGFR's persistently <60 mL/min signify possible Chronic Kidney Disease.       Allergies as of 01/18/2017      Reactions   Lipitor [atorvastatin Calcium] Diarrhea   Niacin And Related Hives   Statins Diarrhea   Macrobid [nitrofurantoin Macrocrystal] Other (See Comments)   unknown      Medication List       Accurate as of 01/18/17  9:21 PM. Always use your most recent med list.          CALTRATE 600+D PO Take 1 tablet by mouth 2 (two) times daily.   clopidogrel 75 MG tablet Commonly known as:  PLAVIX TAKE 1 TABLET BY MOUTH DAILY   co-enzyme Q-10 30 MG capsule Take 100 mg by mouth 2 (two) times daily.   ferrous sulfate 325 (65 FE) MG tablet TAKE 1 TABLET BY MOUTH THREE TIMES DAILY WITH MEALS   furosemide 40 MG tablet Commonly known as:  LASIX TAKE 1 TABLET BY MOUTH EVERY DAY.   gemfibrozil 600 MG tablet Commonly known as:  LOPID TAKE 1 TABLET BY MOUTH TWICE DAILY BEFORE A MEAL   insulin lispro 100 UNIT/ML KiwkPen Commonly known as:  HUMALOG KWIKPEN 6-10 units at mealtimes   Insulin Pen Needle 31G X 8 MM Misc Commonly known as:  B-D ULTRAFINE III SHORT PEN USE AS DIRECTED TO INJECT TOUJEO DAILY   INSULIN SYRINGE 1CC/29G 29G X 1/2" 1 ML Misc AS DIRECTED.   losartan 100 MG tablet Commonly known as:  COZAAR TAKE 1 TABLET BY MOUTH EVERY DAY   metoprolol tartrate  50 MG tablet Commonly known as:  LOPRESSOR TAKE 1/2 TABLET  BY MOUTH TWICE DAILY   multivitamin tablet Take 1 tablet by mouth daily.   omega-3 acid ethyl esters 1 g capsule Commonly known as:  LOVAZA Take 3 capsules (3 g total)  by mouth daily.   omeprazole 20 MG capsule Commonly known as:  PRILOSEC TAKE ONE CAPSULE BY MOUTH DAILY   ONE TOUCH ULTRA SYSTEM KIT w/Device Kit 1 kit by Does not apply route once. Tests Blood sugar before meals and at bedtime   ONE TOUCH ULTRA TEST test strip Generic drug:  glucose blood USE FOUR TIMES DAILY BEFORE MEALS AND EVERY NIGHT AT BEDTIME   potassium chloride SA 20 MEQ tablet Commonly known as:  K-DUR,KLOR-CON TAKE 1 TABLET BY MOUTH TWICE DAILY   pyridOXINE 100 MG tablet Commonly known as:  VITAMIN B-6 Take 100 mg by mouth daily.   TOUJEO SOLOSTAR 300 UNIT/ML Sopn Generic drug:  Insulin Glargine INJECT 46 UNITS UNDER THE SKIN DAILY   TRULICITY 9.45 WT/8.8EK Sopn Generic drug:  Dulaglutide INJECT INTO THE ABDOMINAL SKIN ONCE WEEKLY       Allergies:  Allergies  Allergen Reactions  . Lipitor [Atorvastatin Calcium] Diarrhea  . Niacin And Related Hives  . Statins Diarrhea  . Macrobid [Nitrofurantoin Macrocrystal] Other (See Comments)    unknown    Past Medical History:  Diagnosis Date  . Anemia   . Diabetes mellitus   . Dyslipidemia   . GERD (gastroesophageal reflux disease)   . Hypertension   . Migraines   . Osteoporosis   . PVD (peripheral vascular disease) ([place])    abi .83 (L), .92 (R)  . RSD (reflex sympathetic dystrophy) 2007   R wrist/hand following fx  . Stroke ([place])   . Varicose veins     Past Surgical History:  Procedure Laterality Date  . ABDOMINAL HYSTERECTOMY  1988  . APPENDECTOMY  1966  . BREAST CYST EXCISION    . CATARACT EXTRACTION  10/2009  . CHOLECYSTECTOMY  1989  . Several benign cyst removed     last 1 in 1972  . TUBAL LIGATION    . UMBILICAL HERNIA REPAIR      Family History  Problem  Relation Age of Onset  . Stroke Mother 88  . Hypertension Mother   . Clotting disorder Father   . Heart attack Father   . Arrhythmia Sister   . Stroke Brother   . Diabetes Neg Hx   . Thyroid disease Neg Hx     Social History:  reports that she has never smoked. She has never used smokeless tobacco. She reports that she does not drink alcohol or use drugs.    Review of Systems    She has had a TIA and is taking Plavix  Most recent eye exam was In 03/2016    She is on gemfibrozil and low-dose Lovaza also but triglycerides tend to be high, followed by PCP She claimed that she gets diarrhea with all statin drugs, previously on pravastatin She has been prescribed Lovaza 4 capsules daily Needs follow-up      Lab Results  Component Value Date   CHOL 202 (H) 08/27/2016   HDL 32 (L) 08/27/2016   LDLCALC NOT CALC 08/27/2016   LDLDIRECT 41.8 09/13/2011   TRIG 564 (H) 08/27/2016   CHOLHDL 6.3 (H) 08/27/2016                   Thyroid:    On exam has Had a 3 cm nodule on the right side but she has refused to consider biopsy  Lab Results  Component Value Date   TSH 2.00 08/27/2016       The blood pressure has been managed with losartan 100 mg and metoprolol,  Followed by PCP She now says that she does feel lightheaded at times on standing up more recently Her blood pressure was normal with her PCP on a recent visit         She has a history of Numbness in her feet and toes From diabetic neuropathy.    Last foot exam was in 9/17 showing sensory loss   Physical Examination:  BP 116/68   Pulse 71   Ht _0  (1.626 m)   Wt 138 lb (62.6 kg)   SpO2 95%   BMI 23.69 kg/m   Standing 95/50    ASSESSMENT /PLAN:  Diabetes type 2, uncontrolled    See history of present illness for detailed discussion of her current management and problems identified as well as blood sugar patterns  Her postprandial readings have not been assessed and not clear if she is benefiting from  Trulicity However she was clearly benefiting from [place] which she cannot tolerate Fasting readings are variable but not consistently improved and no hypoglycemia overnight also Difficult to adjust her suppertime Humalog doses and she does not do readings at night   Orthostatic HYPOTENSION: She appears symptomatic and her recent creatinine has gone up and likely needs less losartan on diuretics   HYPERLIPIDEMIA: Needs fasting labs  Recommendations:  Discussed need to check readings after meals and she can try to do a reading around 10:00 meals at night  She will not change her Toujeo or Trulicity as it  She does need to reduce her losartan to half tablet daily and follow-up with PCP  She will need to follow-up with PCP regarding her blood pressure Also recommend getting fasting labs with him in the morning  Patient Instructions  Check blood sugars on waking up 4/7 days   Also check blood sugars about 2 hours after a meal and do this after different meals by rotation  Recommended blood sugar levels on waking up is 90-130 and about 2 hours after meal is 130-160  Please bring your blood sugar monitor to each visit, thank you  Take Losartan 1/2 daily    Total visit time for evaluation and management of various problems including diabetes, counseling, review of downloaded glucose monitor, medication review, lab review and communications = 25 minutes   Rebecca Perkins 01/18/2017, 9:21 PM   Addendum: Creatinine 1.3, glucose 222   Note: This office note was prepared with Estate agent. Any transcriptional errors that result from this process are unintentional.

## 2017-01-18 NOTE — Progress Notes (Signed)
Please call to let patient know that the kidney test is slightly better but still not normal, she will follow-up with PCP as discussed

## 2017-01-18 NOTE — Patient Instructions (Addendum)
Check blood sugars on waking up 4/7 days   Also check blood sugars about 2 hours after a meal and do this after different meals by rotation  Recommended blood sugar levels on waking up is 90-130 and about 2 hours after meal is 130-160  Please bring your blood sugar monitor to each visit, thank you  Take Losartan 1/2 daily

## 2017-01-19 LAB — FRUCTOSAMINE: Fructosamine: 309 umol/L — ABNORMAL HIGH (ref 0–285)

## 2017-01-21 ENCOUNTER — Ambulatory Visit (INDEPENDENT_AMBULATORY_CARE_PROVIDER_SITE_OTHER): Payer: Medicare Other | Admitting: Family Medicine

## 2017-01-21 ENCOUNTER — Other Ambulatory Visit: Payer: Self-pay | Admitting: Family Medicine

## 2017-01-21 ENCOUNTER — Encounter: Payer: Self-pay | Admitting: Family Medicine

## 2017-01-21 VITALS — BP 118/60 | HR 62 | Temp 97.8°F | Resp 16 | Ht 64.0 in | Wt 137.0 lb

## 2017-01-21 DIAGNOSIS — N289 Disorder of kidney and ureter, unspecified: Secondary | ICD-10-CM

## 2017-01-21 LAB — URINALYSIS, MICROSCOPIC ONLY
Casts: NONE SEEN [LPF]
Crystals: NONE SEEN [HPF]
RBC / HPF: NONE SEEN RBC/HPF (ref <=2)
Yeast: NONE SEEN [HPF]

## 2017-01-21 LAB — URINALYSIS, ROUTINE W REFLEX MICROSCOPIC
Bilirubin Urine: NEGATIVE
Glucose, UA: NEGATIVE
Hgb urine dipstick: NEGATIVE
Ketones, ur: NEGATIVE
Nitrite: POSITIVE — AB
Protein, ur: NEGATIVE
Specific Gravity, Urine: 1.015 (ref 1.001–1.035)
pH: 5.5 (ref 5.0–8.0)

## 2017-01-21 MED ORDER — CEPHALEXIN 500 MG PO CAPS: 500.0000 mg | ORAL_CAPSULE | Freq: Three times a day (TID) | ORAL | 0 refills | Status: DC

## 2017-01-21 NOTE — Progress Notes (Signed)
Subjective:    Patient ID: Rebecca Perkins, female    DOB: January 12, 1929, 83 y.o.   MRN: [id number]  HPI The patient last had lab work in our office in January and at that time her creatinine was 0.89. Her calculated glomerular filtration rate was approximate 60 mL of blood per minute. Last week, her endocrinologist recheck her creatinine and found to be 1.55 with a GFR of 30 mL of blood per minute. Repeat BMP revealed a creatinine of 1.37 with a GFR of 41 mL of blood per minute. She is here today to discuss. Patient is an 83 year old Caucasian female with a long-standing history of hypertriglyceridemia and insulin-dependent diabetes mellitus that has been poorly controlled. She is not taking any type of NSAID. However she is not drinking enough fluid. Recently she was found to be orthostatic and hypotensive causing her dizziness upon standing. She is also taking Lasix due to swelling in her legs. She denies any symptoms of a bladder infection Past Medical History:  Diagnosis Date  . Anemia   . Diabetes mellitus   . Dyslipidemia   . GERD (gastroesophageal reflux disease)   . Hypertension   . Migraines   . Osteoporosis   . PVD (peripheral vascular disease) ([place])    abi .83 (L), .92 (R)  . RSD (reflex sympathetic dystrophy) 2007   R wrist/hand following fx  . Stroke ([place])   . Varicose veins    Past Surgical History:  Procedure Laterality Date  . ABDOMINAL HYSTERECTOMY  1988  . APPENDECTOMY  1966  . BREAST CYST EXCISION    . CATARACT EXTRACTION  10/2009  . CHOLECYSTECTOMY  1989  . Several benign cyst removed     last 1 in 1972  . TUBAL LIGATION    . UMBILICAL HERNIA REPAIR     Current Outpatient Prescriptions on File Prior to Visit  Medication Sig Dispense Refill  . Blood Glucose Monitoring Suppl (ONE TOUCH ULTRA SYSTEM KIT) W/DEVICE KIT 1 kit by Does not apply route once. Tests Blood sugar before meals and at bedtime 1 each 0  . Calcium Carbonate-Vitamin D (CALTRATE 600+D PO) Take  1 tablet by mouth 2 (two) times daily.    . clopidogrel (PLAVIX) 75 MG tablet TAKE 1 TABLET BY MOUTH DAILY 30 tablet 11  . co-enzyme Q-10 30 MG capsule Take 100 mg by mouth 2 (two) times daily.    . ferrous sulfate 325 (65 FE) MG tablet TAKE 1 TABLET BY MOUTH THREE TIMES DAILY WITH MEALS (Patient taking differently: TAKE 1 TABLET BY MOUTH TWO TIMES DAILY WITH MEALS) 90 tablet 4  . furosemide (LASIX) 40 MG tablet TAKE 1 TABLET BY MOUTH EVERY DAY. 30 tablet 11  . gemfibrozil (LOPID) 600 MG tablet TAKE 1 TABLET BY MOUTH TWICE DAILY BEFORE A MEAL 60 tablet 11  . insulin lispro (HUMALOG KWIKPEN) 100 UNIT/ML KiwkPen 6-10 units at mealtimes (Patient taking differently: 6-10 units at dinner time) 15 mL 1  . Insulin Pen Needle (B-D ULTRAFINE III SHORT PEN) 31G X 8 MM MISC USE AS DIRECTED TO INJECT TOUJEO DAILY 100 each 5  . INSULIN SYRINGE 1CC/29G 29G X 1/2" 1 ML MISC AS DIRECTED. 100 each 3  . losartan (COZAAR) 100 MG tablet TAKE 1 TABLET BY MOUTH EVERY DAY (Patient taking differently: TAKE 1/2 TABLET BY MOUTH EVERY DAY) 30 tablet 11  . metoprolol (LOPRESSOR) 50 MG tablet TAKE 1/2 TABLET  BY MOUTH TWICE DAILY 180 tablet 0  . Multiple Vitamin (MULTIVITAMIN) tablet  Take 1 tablet by mouth daily.      Marland Kitchen omega-3 acid ethyl esters (LOVAZA) 1 g capsule Take 3 capsules (3 g total) by mouth daily. (Patient taking differently: Take 4 capsules by mouth daily. Takes 2 in am and 2 in the pm) 60 capsule 11  . omeprazole (PRILOSEC) 20 MG capsule TAKE ONE CAPSULE BY MOUTH DAILY 30 capsule 11  . ONE TOUCH ULTRA TEST test strip USE FOUR TIMES DAILY BEFORE MEALS AND EVERY NIGHT AT BEDTIME 150 each 5  . potassium chloride SA (K-DUR,KLOR-CON) 20 MEQ tablet TAKE 1 TABLET BY MOUTH TWICE DAILY 180 tablet 0  . Pyridoxine HCl (VITAMIN B-6) 100 MG tablet Take 100 mg by mouth daily.      . TOUJEO SOLOSTAR 300 UNIT/ML SOPN INJECT 46 UNITS UNDER THE SKIN DAILY (Patient taking differently: INJECT 42 UNITS UNDER THE SKIN DAILY) 9 pen 1    . TRULICITY 5.72 IO/0.3TD SOPN INJECT INTO THE ABDOMINAL SKIN ONCE WEEKLY 2 mL 0   No current facility-administered medications on file prior to visit.    Allergies  Allergen Reactions  . Lipitor [Atorvastatin Calcium] Diarrhea  . Niacin And Related Hives  . Statins Diarrhea  . Macrobid [Nitrofurantoin Macrocrystal] Other (See Comments)    unknown   Social History   Social History  . Marital status: Widowed    Spouse name: N/A  . Number of children: 3  . Years of education: N/A   Occupational History  . Retired    Social History Main Topics  . Smoking status: Never Smoker  . Smokeless tobacco: Never Used  . Alcohol use No  . Drug use: No  . Sexual activity: Not on file   Other Topics Concern  . 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      Objective:   Physical Exam  Cardiovascular: Normal rate, regular rhythm and normal heart sounds.   Pulmonary/Chest: Effort normal and breath sounds normal. No respiratory distress. She has no wheezes. She has no rales.  Abdominal: Soft. Bowel sounds are normal. She exhibits no distension. There is no tenderness. There is no rebound.  Musculoskeletal: She exhibits no edema.  Vitals reviewed.         Assessment & Plan:  Renal insufficiency - Plan: Urinalysis, Routine w reflex microscopic, US Renal  Multi-factorial. Suspect related to age, chronic medical comorbidities, uncontrolled diabetes mellitus, dehydration, and diuretic use. I've asked the patient to discontinue her use of Lasix.  Recheck BMP in 1 week.  Obtain renal US and UA.

## 2017-02-03 ENCOUNTER — Ambulatory Visit
Admission: RE | Admit: 2017-02-03 | Discharge: 2017-02-03 | Disposition: A | Discharge status: Home / Self Care | Payer: Medicare Other | Source: Ambulatory Visit | Attending: Family Medicine | Admitting: Family Medicine

## 2017-02-03 DIAGNOSIS — N289 Disorder of kidney and ureter, unspecified: Secondary | ICD-10-CM

## 2017-02-04 ENCOUNTER — Ambulatory Visit (INDEPENDENT_AMBULATORY_CARE_PROVIDER_SITE_OTHER): Payer: Medicare Other | Admitting: Family Medicine

## 2017-02-04 ENCOUNTER — Encounter: Payer: Self-pay | Admitting: Family Medicine

## 2017-02-04 VITALS — BP 136/78 | HR 80 | Temp 97.9°F | Resp 16 | Ht 64.0 in | Wt 137.0 lb

## 2017-02-04 DIAGNOSIS — N179 Acute kidney failure, unspecified: Secondary | ICD-10-CM

## 2017-02-04 LAB — URINALYSIS, MICROSCOPIC ONLY
Casts: NONE SEEN [LPF]
Crystals: NONE SEEN [HPF]
RBC / HPF: NONE SEEN RBC/HPF (ref <=2)
Yeast: NONE SEEN [HPF]

## 2017-02-04 LAB — URINALYSIS, ROUTINE W REFLEX MICROSCOPIC
Bilirubin Urine: NEGATIVE
Glucose, UA: NEGATIVE
Hgb urine dipstick: NEGATIVE
Ketones, ur: NEGATIVE
Nitrite: NEGATIVE
Protein, ur: NEGATIVE
Specific Gravity, Urine: 1.015 (ref 1.001–1.035)
pH: 6 (ref 5.0–8.0)

## 2017-02-04 MED ORDER — FERROUS SULFATE 325 (65 FE) MG PO TABS: 325.0000 mg | ORAL_TABLET | Freq: Three times a day (TID) | ORAL | 4 refills | Status: DC

## 2017-02-04 NOTE — Progress Notes (Signed)
Subjective:    Patient ID: Rebecca Perkins, female    DOB: 11-Mar-1929, 83 y.o.   MRN: [id number]  HPI  01/21/17 The patient last had lab work in our office in January and at that time her creatinine was 0.89. Her calculated glomerular filtration rate was approximate 60 mL of blood per minute. Last week, her endocrinologist recheck her creatinine and found to be 1.55 with a GFR of 30 mL of blood per minute. Repeat BMP revealed a creatinine of 1.37 with a GFR of 41 mL of blood per minute. She is here today to discuss. Patient is an 83 year old Caucasian female with a long-standing history of hypertriglyceridemia and insulin-dependent diabetes mellitus that has been poorly controlled. She is not taking any type of NSAID. However she is not drinking enough fluid. Recently she was found to be orthostatic and hypotensive causing her dizziness upon standing. She is also taking Lasix due to swelling in her legs. She denies any symptoms of a bladder infection.  At that time, my plan was:  Multi-factorial. Suspect related to age, chronic medical comorbidities, uncontrolled diabetes mellitus, dehydration, and diuretic use. I've asked the patient to discontinue her use of Lasix.  Recheck BMP in 1 week.  Obtain renal US and UA.   02/04/17 Urinalysis obtained at that office visit suggested a serious infection and the patient was treated with antibiotics. She states that by the time she finished antibiotics she felt much better. The dizziness upon standing had essentially resolved. She was no longer orthostatic. She's been trying to drink more fluid. Renal ultrasound was unremarkable. There is no hydronephrosis. There is no mass. She is here today to recheck her urine sample and also to recheck her renal function. Past Medical History:  Diagnosis Date  . Anemia   . Diabetes mellitus   . Dyslipidemia   . GERD (gastroesophageal reflux disease)   . Hypertension   . Migraines   . Osteoporosis   . PVD (peripheral  vascular disease) ([place])    abi .83 (L), .92 (R)  . RSD (reflex sympathetic dystrophy) 2007   R wrist/hand following fx  . Stroke ([place])   . Varicose veins    Past Surgical History:  Procedure Laterality Date  . ABDOMINAL HYSTERECTOMY  1988  . APPENDECTOMY  1966  . BREAST CYST EXCISION    . CATARACT EXTRACTION  10/2009  . CHOLECYSTECTOMY  1989  . Several benign cyst removed     last 1 in 1972  . TUBAL LIGATION    . UMBILICAL HERNIA REPAIR     Current Outpatient Prescriptions on File Prior to Visit  Medication Sig Dispense Refill  . Blood Glucose Monitoring Suppl (ONE TOUCH ULTRA SYSTEM KIT) W/DEVICE KIT 1 kit by Does not apply route once. Tests Blood sugar before meals and at bedtime 1 each 0  . Calcium Carbonate-Vitamin D (CALTRATE 600+D PO) Take 1 tablet by mouth 2 (two) times daily.    . clopidogrel (PLAVIX) 75 MG tablet TAKE 1 TABLET BY MOUTH DAILY 30 tablet 11  . co-enzyme Q-10 30 MG capsule Take 100 mg by mouth 2 (two) times daily.    . ferrous sulfate 325 (65 FE) MG tablet TAKE 1 TABLET BY MOUTH THREE TIMES DAILY WITH MEALS (Patient taking differently: TAKE 1 TABLET BY MOUTH TWO TIMES DAILY WITH MEALS) 90 tablet 4  . gemfibrozil (LOPID) 600 MG tablet TAKE 1 TABLET BY MOUTH TWICE DAILY BEFORE A MEAL 60 tablet 11  . insulin lispro (HUMALOG KWIKPEN)  100 UNIT/ML KiwkPen 6-10 units at mealtimes (Patient taking differently: 6-10 units at dinner time) 15 mL 1  . Insulin Pen Needle (B-D ULTRAFINE III SHORT PEN) 31G X 8 MM MISC USE AS DIRECTED TO INJECT TOUJEO DAILY 100 each 5  . INSULIN SYRINGE 1CC/29G 29G X 1/2" 1 ML MISC AS DIRECTED. 100 each 3  . losartan (COZAAR) 100 MG tablet TAKE 1 TABLET BY MOUTH EVERY DAY (Patient taking differently: TAKE 1/2 TABLET BY MOUTH EVERY DAY) 30 tablet 11  . metoprolol (LOPRESSOR) 50 MG tablet TAKE 1/2 TABLET  BY MOUTH TWICE DAILY 180 tablet 0  . Multiple Vitamin (MULTIVITAMIN) tablet Take 1 tablet by mouth daily.      Marland Kitchen omega-3 acid ethyl esters  (LOVAZA) 1 g capsule Take 3 capsules (3 g total) by mouth daily. (Patient taking differently: Take 4 capsules by mouth daily. Takes 2 in am and 2 in the pm) 60 capsule 11  . omeprazole (PRILOSEC) 20 MG capsule TAKE ONE CAPSULE BY MOUTH DAILY 30 capsule 11  . ONE TOUCH ULTRA TEST test strip USE FOUR TIMES DAILY BEFORE MEALS AND EVERY NIGHT AT BEDTIME 150 each 5  . Pyridoxine HCl (VITAMIN B-6) 100 MG tablet Take 100 mg by mouth daily.      . TOUJEO SOLOSTAR 300 UNIT/ML SOPN INJECT 46 UNITS UNDER THE SKIN DAILY (Patient taking differently: INJECT 42 UNITS UNDER THE SKIN DAILY) 9 pen 1  . TRULICITY 3.84 TX/6.4WO SOPN INJECT INTO THE ABDOMINAL SKIN ONCE WEEKLY 2 mL 0  . furosemide (LASIX) 40 MG tablet TAKE 1 TABLET BY MOUTH EVERY DAY. (Patient not taking: Reported on 02/04/2017) 30 tablet 11  . potassium chloride SA (K-DUR,KLOR-CON) 20 MEQ tablet TAKE 1 TABLET BY MOUTH TWICE DAILY (Patient not taking: Reported on 02/04/2017) 180 tablet 0   No current facility-administered medications on file prior to visit.    Allergies  Allergen Reactions  . Lipitor [Atorvastatin Calcium] Diarrhea  . Niacin And Related Hives  . Statins Diarrhea  . Macrobid [Nitrofurantoin Macrocrystal] Other (See Comments)    unknown   Social History   Social History  . Marital status: Widowed    Spouse name: N/A  . Number of children: 3  . Years of education: N/A   Occupational History  . Retired    Social History Main Topics  . Smoking status: Never Smoker  . Smokeless tobacco: Never Used  . Alcohol use No  . Drug use: No  . Sexual activity: Not on file   Other Topics Concern  . 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      Objective:   Physical Exam  Cardiovascular: Normal rate, regular rhythm and normal  heart sounds.   Pulmonary/Chest: Effort normal and breath sounds normal. No respiratory distress. She has no wheezes. She has no rales.  Abdominal: Soft. Bowel sounds are normal. She exhibits no distension. There is no tenderness. There is no rebound.  Musculoskeletal: She exhibits no edema.  Vitals reviewed.         Assessment & Plan:  AKI (acute kidney injury) ([place]) - Plan: Urinalysis, Routine w reflex microscopic, BASIC METABOLIC PANEL WITH GFR  Symptomatically, the patient feels much better. Orthostatic dizziness has resolved. She appears more hydrated today. I will  repeat a urinalysis to ensure resolution of her urinary tract infection. I will also repeat her renal function as I anticipated it would be better and back to her baseline. Renal ultrasound was reassuring. If worsening, consider an SPEP or UPEP

## 2017-02-04 NOTE — Addendum Note (Signed)
Addended by: Shary Decamp B on: 02/04/2017 02:37 PM   Modules accepted: Orders

## 2017-02-05 ENCOUNTER — Other Ambulatory Visit: Payer: Self-pay | Admitting: Endocrinology

## 2017-02-05 LAB — BASIC METABOLIC PANEL WITH GFR
BUN: 15 mg/dL (ref 7–25)
CO2: 25 mmol/L (ref 20–31)
Calcium: 9.6 mg/dL (ref 8.6–10.4)
Chloride: 109 mmol/L (ref 98–110)
Creat: 0.87 mg/dL (ref 0.60–0.88)
GFR, Est African American: 70 mL/min (ref >=60)
GFR, Est Non African American: 61 mL/min (ref >=60)
Glucose, Bld: 95 mg/dL (ref 70–99)
Potassium: 3.9 mmol/L (ref 3.5–5.3)
Sodium: 143 mmol/L (ref 135–146)

## 2017-02-09 ENCOUNTER — Ambulatory Visit: Payer: Medicare Other | Admitting: Physical Therapy

## 2017-02-09 ENCOUNTER — Telehealth (HOSPITAL_COMMUNITY): Payer: Self-pay | Admitting: Family Medicine

## 2017-02-09 ENCOUNTER — Ambulatory Visit (HOSPITAL_COMMUNITY): Payer: Medicare Other | Admitting: Physical Therapy

## 2017-02-09 NOTE — Telephone Encounter (Signed)
02/09/17  pt left a message to cx she said that she was having problems with her blood sugar and can't come in but does want a call back to reschedule

## 2017-02-11 ENCOUNTER — Other Ambulatory Visit: Payer: Medicare Other

## 2017-02-11 ENCOUNTER — Other Ambulatory Visit: Payer: Self-pay | Admitting: Family Medicine

## 2017-02-11 DIAGNOSIS — R309 Painful micturition, unspecified: Secondary | ICD-10-CM

## 2017-02-11 LAB — URINALYSIS, ROUTINE W REFLEX MICROSCOPIC
Bilirubin Urine: NEGATIVE
Glucose, UA: NEGATIVE
Ketones, ur: NEGATIVE
Nitrite: POSITIVE — AB
Specific Gravity, Urine: 1.02 (ref 1.001–1.035)
pH: 7 (ref 5.0–8.0)

## 2017-02-11 LAB — URINALYSIS, MICROSCOPIC ONLY
Casts: NONE SEEN [LPF]
Crystals: NONE SEEN [HPF]
Squamous Epithelial / LPF: NONE SEEN [HPF] (ref <=5)
WBC, UA: >60 WBC/HPF — AB (ref <=5)
Yeast: NONE SEEN [HPF]

## 2017-02-11 MED ORDER — CIPROFLOXACIN HCL 250 MG PO TABS: 250.0000 mg | ORAL_TABLET | Freq: Two times a day (BID) | ORAL | 0 refills | Status: DC

## 2017-02-11 NOTE — Progress Notes (Signed)
cipro

## 2017-02-14 ENCOUNTER — Encounter (HOSPITAL_COMMUNITY): Payer: Self-pay | Admitting: Physical Therapy

## 2017-02-14 ENCOUNTER — Ambulatory Visit (HOSPITAL_COMMUNITY): Payer: Medicare Other | Attending: Family Medicine | Admitting: Physical Therapy

## 2017-02-14 DIAGNOSIS — M6281 Muscle weakness (generalized): Secondary | ICD-10-CM | POA: Insufficient documentation

## 2017-02-14 DIAGNOSIS — R2689 Other abnormalities of gait and mobility: Secondary | ICD-10-CM | POA: Insufficient documentation

## 2017-02-14 DIAGNOSIS — R2681 Unsteadiness on feet: Secondary | ICD-10-CM | POA: Insufficient documentation

## 2017-02-14 NOTE — Therapy (Signed)
[place], [place], [address] Phone: [phone]   Fax:  [phone]  Physical Therapy Evaluation  Patient Details  Name: Rebecca Perkins MRN: [id number] Date of Birth: 13-Nov-1928 Referring Provider: Jenna Luo, MD   Encounter Date: 02/14/2017      PT End of Session - 02/14/17 1219    Visit Number 1   Number of Visits 13   Date for PT Re-Evaluation 03/07/17   Authorization Type U[employer] Medicare    Authorization Time Period 02/14/17 to 04/01/17   PT Start Time 1030   PT Stop Time 1115   PT Time Calculation (min) 45 min   Equipment Utilized During Treatment Gait belt   Activity Tolerance Patient tolerated treatment well;No increased pain   Behavior During Therapy WFL for tasks assessed/performed      Past Medical History:  Diagnosis Date  . Anemia   . Diabetes mellitus   . Dyslipidemia   . GERD (gastroesophageal reflux disease)   . Hypertension   . Migraines   . Osteoporosis   . PVD (peripheral vascular disease) ([place])    abi .83 (L), .92 (R)  . RSD (reflex sympathetic dystrophy) 2007   R wrist/hand following fx  . Stroke ([place])   . Varicose veins     Past Surgical History:  Procedure Laterality Date  . ABDOMINAL HYSTERECTOMY  1988  . APPENDECTOMY  1966  . BREAST CYST EXCISION    . CATARACT EXTRACTION  10/2009  . CHOLECYSTECTOMY  1989  . Several benign cyst removed     last 1 in 1972  . TUBAL LIGATION    . UMBILICAL HERNIA REPAIR      There were no vitals filed for this visit.       Subjective Assessment - 02/14/17 1037    Subjective Pt reports that she had a mini stroke back in 2016, and since that time, she just hasn't felt very steady with walking and other activity. She initially had bad dizziness which has been fully resolved since getting re-hydrated.    Pertinent History stroke 2016, PVD, osteoporosis, HTN, diabetes   Patient Stated Goals improve balance    Currently in Pain?  No/denies            [facility] PT Assessment - 02/14/17 0001      Assessment   Medical Diagnosis Gait abnormality   Referring Provider Jenna Luo, MD    Next MD Visit none for this    Prior Therapy none      Precautions   Precautions None     Balance Screen   Has the patient fallen in the past 6 months No   Has the patient had a decrease in activity level because of a fear of falling?  No   Is the patient reluctant to leave their home because of a fear of falling?  No     Home Environment   Living Environment Private residence   Living Arrangements Alone   Type of [place]   Additional Comments 3 STE with handrails      Prior Function   Level of Independence Independent     Cognition   Overall Cognitive Status Within Functional Limits for tasks assessed     Observation/Other Assessments   Focus on Therapeutic Outcomes (FOTO)  40% limited      Sensation   Light Touch Appears Intact     ROM / Strength   AROM / PROM / Strength Strength  Strength   Strength Assessment Site Hip;Knee;Ankle   Right/Left Hip Right;Left   Right Hip Flexion 4-/5   Right Hip Extension 3/5   Right Hip ABduction 4-/5   Left Hip Flexion 4-/5   Left Hip Extension 3/5   Left Hip ABduction 4-/5   Right/Left Knee Right;Left   Right Knee Flexion 3+/5   Right Knee Extension 5/5   Left Knee Flexion 3+/5   Left Knee Extension 5/5   Right/Left Ankle Right;Left   Right Ankle Dorsiflexion 4+/5   Left Ankle Dorsiflexion 4/5     Transfers   Five time sit to stand comments  10.4 sec, no UE      Standardized Balance Assessment   Standardized Balance Assessment Berg Balance Test;Dynamic Gait Index     Berg Balance Test   Sit to Stand Able to stand without using hands and stabilize independently   Standing Unsupported Able to stand safely 2 minutes   Sitting with Back Unsupported but Feet Supported on Floor or Stool Able to sit safely and securely 2 minutes   Stand to Sit Sits safely  with minimal use of hands   Transfers Able to transfer safely, minor use of hands   Standing Unsupported with Eyes Closed Able to stand 10 seconds safely   Standing Ubsupported with Feet Together Able to place feet together independently and stand 1 minute safely   From Standing, Reach Forward with Outstretched Arm Can reach forward >12 cm safely (5")  unable with RUE due to stiffness/discfomfort   From Standing Position, Pick up Object from Floor Able to pick up shoe safely and easily   From Standing Position, Turn to Look Behind Over each Shoulder Looks behind from both sides and weight shifts well   Turn 360 Degrees Able to turn 360 degrees safely in 4 seconds or less   Standing Unsupported, Alternately Place Feet on Step/Stool Able to stand independently and safely and complete 8 steps in 20 seconds   Standing Unsupported, One Foot in Front Able to plae foot ahead of the other independently and hold 30 seconds   Standing on One Leg Tries to lift leg/unable to hold 3 seconds but remains standing independently  1-2 sec each   Total Score 51     Dynamic Gait Index   Level Surface Mild Impairment   Change in Gait Speed Mild Impairment   Gait with Horizontal Head Turns Mild Impairment   Gait with Vertical Head Turns Mild Impairment   Gait and Pivot Turn Normal   Step Over Obstacle Mild Impairment   Step Around Obstacles Mild Impairment   Steps Moderate Impairment   Total Score 16            Objective measurements completed on examination: See above findings.          [facility] Adult PT Treatment/Exercise - 02/14/17 0001      Exercises   Exercises Knee/Hip     Knee/Hip Exercises: Standing   Heel Raises Both;1 set;5 reps   Heel Raises Limitations toe raises x5 reps BLE, HEP demo     Knee/Hip Exercises: Supine   Bridges Both;1 set;5 reps   Bridges Limitations HEP demo    Other Supine Knee/Hip Exercises single leg clamshells with red TB x5 reps each                  PT Education - 02/14/17 1218    Education provided Yes   Education Details eval findings/POC; discussed results of balance testing  and benefits of PT; implemented and reviewed HEP   Person(s) Educated Patient   Methods Explanation;Verbal cues;Demonstration;Handout   Comprehension Returned demonstration;Verbalized understanding          PT Short Term Goals - 02/14/17 1227      PT SHORT TERM GOAL #1   Title Pt will demo consistency and independence with her HEP to improve BLE strength and balance.   Time 3   Period Weeks   Status New     PT SHORT TERM GOAL #2   Title Pt will demo improved single leg balance up to atleast 5 sec for 2/3 trials, without LOB, to improve safety with ambulation.    Time 3   Period Weeks   Status New           PT Long Term Goals - 02/14/17 1229      PT LONG TERM GOAL #1   Title Pt will demo improved BLE strenght to atleast 4/5 MMT, which will improve her tolerance and safety with daily activity.   Time 6   Period Weeks   Status New     PT LONG TERM GOAL #2   Title Pt will demo improved safety with stair negotiation, evident by her ability to ascend and descend atleast 4, 6" steps without handrails, using reciprocal pattern 2/3 trials.    Time 6   Period Weeks   Status New     PT LONG TERM GOAL #3   Title Pt will score greater than 19 points on the DGI, to reflect improvements in her dynamic balance and decrease her risk of falling at home and in the community without an AD.    Time 6   Period Weeks   Status New     PT LONG TERM GOAL #4   Title Pt will complete single leg balance on each LE for atleast 10 sec, 2/3 trials, to decrease her risk of falling and causing injury to herself.   Time 6   Period Weeks   Status New                Plan - 02/14/17 1220    Clinical Impression Statement Pt is a pleasant 83 y.o F referred to OPPT for evaluation of gait abnormality and unsteadiness. She reports no falls as of lately,  however she does report feeling unsteady throughout the day with her regular activity. She is trying to prevent a bad fall and is hoping to improve. She demonstrates significant weakness of the BLE, poor single leg balance, and difficulty with stair negotiation. She scored 51/56 on the BERG which places her in a low falls risk for more static balance activity, however she scored below a 19 on the DGI placing her at an increased risk of falling during activity. She would benefit from skilled PT to address her limitations in strength, balance and mobility, to decrease her risk of falling and causing injury to herself. HEP was implemented and therapist discussed PT POC/frequency with pt who verbalized agreement at this time.    History and Personal Factors relevant to plan of care: pt lives alone, balance has improved some since improving her hyration    Clinical Presentation Stable   Clinical Decision Making Low   Rehab Potential Good   Clinical Impairments Affecting Rehab Potential (+) motivated    PT Frequency 2x / week   PT Duration 6 weeks   PT Treatment/Interventions ADLs/Self Care Home Management;Balance training;Therapeutic exercise;Patient/family education;Functional mobility training;Stair training;Gait training;Neuromuscular re-education;Manual techniques;Passive  range of motion   PT Next Visit Plan Focus on relatively simple hip extensor/abductor strengthening, hamstring curls, begin static balance activity    PT Home Exercise Plan Supine bridge x10 reps, supine single leg clamshells with red TB x10 reps each, standing heel/toe raises x15 reps    Consulted and Agree with Plan of Care Patient      Patient will benefit from skilled therapeutic intervention in order to improve the following deficits and impairments:  Abnormal gait, Decreased activity tolerance, Decreased balance, Difficulty walking, Impaired flexibility, Hypomobility, Decreased strength, Decreased range of motion, Decreased  mobility, Improper body mechanics  Visit Diagnosis: Unsteadiness on feet  Muscle weakness (generalized)  Other abnormalities of gait and mobility      G-Codes - Mar 08, 2017 1232    Functional Assessment Tool Used (Outpatient Only) FOTO: 40% limited   Functional Limitation Mobility: Walking and moving around   Mobility: Walking and Moving Around Current Status [phone]) At least 40 percent but less than 60 percent impaired, limited or restricted   Mobility: Walking and Moving Around Goal Status [id number]) At least 20 percent but less than 40 percent impaired, limited or restricted       Problem List Patient Active Problem List   Diagnosis Date Noted  . Aphasia 09/01/2015  . TIA (transient ischemic attack) 06/17/2015  . Osteoporosis 02/18/2015  . Normocytic anemia 12/13/2013  . Diabetes mellitus type II, uncontrolled ([place]) 12/10/2013  . Pneumonia 12/10/2013  . Sepsis ([place]) 12/09/2013  . Type 2 diabetes mellitus with insulin deficiency ([place])   . Hypertension   . Hypertriglyceridemia   . Dyslipidemia    12:35 PM,March 08, 2017 Elly Modena PT, DPT [facility] [place] [address] [place], [place], [address] Phone: [phone]   Fax:  [phone]  Name: SHAMIYAH NGU MRN: [id number] Date of Birth: [birth year]

## 2017-02-18 ENCOUNTER — Ambulatory Visit (HOSPITAL_COMMUNITY): Payer: Medicare Other | Admitting: Physical Therapy

## 2017-02-18 DIAGNOSIS — R2681 Unsteadiness on feet: Secondary | ICD-10-CM | POA: Diagnosis not present

## 2017-02-18 DIAGNOSIS — M6281 Muscle weakness (generalized): Secondary | ICD-10-CM

## 2017-02-18 DIAGNOSIS — R2689 Other abnormalities of gait and mobility: Secondary | ICD-10-CM

## 2017-02-18 NOTE — Therapy (Signed)
[place], [place], [address] Phone: [phone]   Fax:  [phone]  Physical Therapy Treatment  Patient Details  Name: Rebecca Perkins MRN: [id number] Date of Birth: [birth year] Referring Provider: Jenna Luo, MD   Encounter Date: 02/18/2017      PT End of Session - 02/18/17 1549    Visit Number 2   Number of Visits 13   Date for PT Re-Evaluation 03/07/17   Authorization Type UHC Medicare    Authorization Time Period 02/14/17 to 04/01/17   PT Start Time 1324   PT Stop Time 1627   PT Time Calculation (min) 42 min   Equipment Utilized During Treatment Gait belt   Activity Tolerance Patient tolerated treatment well;No increased pain   Behavior During Therapy WFL for tasks assessed/performed      Past Medical History:  Diagnosis Date  . Anemia   . Diabetes mellitus   . Dyslipidemia   . GERD (gastroesophageal reflux disease)   . Hypertension   . Migraines   . Osteoporosis   . PVD (peripheral vascular disease) ([place])    abi .83 (L), .92 (R)  . RSD (reflex sympathetic dystrophy) 2007   R wrist/hand following fx  . Stroke ([place])   . Varicose veins     Past Surgical History:  Procedure Laterality Date  . ABDOMINAL HYSTERECTOMY  1988  . APPENDECTOMY  1966  . BREAST CYST EXCISION    . CATARACT EXTRACTION  10/2009  . CHOLECYSTECTOMY  1989  . Several benign cyst removed     last 1 in 1972  . TUBAL LIGATION    . UMBILICAL HERNIA REPAIR      There were no vitals filed for this visit.      Subjective Assessment - 02/18/17 1546    Subjective Pt reports things are going well. She has no complaints currently.    Pertinent History stroke 2016, PVD, osteoporosis, HTN, diabetes   Patient Stated Goals improve balance    Currently in Pain? No/denies                         [facility] Adult PT Treatment/Exercise - 02/18/17 0001      Knee/Hip Exercises: Stretches   Passive Hamstring Stretch  Both;2 reps;30 seconds   Passive Hamstring Stretch Limitations 12" box      Knee/Hip Exercises: Standing   Heel Raises Both;1 set;15 reps   Heel Raises Limitations on incline   Other Standing Knee Exercises sidestepping in // bars with red TB around ankles x3 RT      Knee/Hip Exercises: Seated   Sit to Sand 10 reps;Other (comment)  blue TB around knees, table slightly elevated      Knee/Hip Exercises: Supine   Bridges Both;1 set;10 reps   Bridges Limitations red TB around knees    Bridges with [employer] Both;1 set;10 reps   Other Supine Knee/Hip Exercises straight leg bridge on 6" box with foam pad x15 reps      Knee/Hip Exercises: Prone   Hamstring Curl 2 sets;10 reps   Hamstring Curl Limitations single red TB              Balance Exercises - 02/18/17 1609      Balance Exercises: Standing   Standing Eyes Opened Narrow base of support (BOS);Other (comment)  trunk rotation Lt/Rt x10 reps each    SLS Eyes open;2 reps;Solid surface;30 secs;Other (comment)  single leg stance on  foam with contralat hip flexion x10 ea.           PT Education - 02/18/17 1602    Education provided Yes   Education Details technique with therex   Person(s) Educated Patient   Methods Tactile cues;Verbal cues;Demonstration   Comprehension Verbalized understanding;Returned demonstration          PT Short Term Goals - 02/14/17 1227      PT SHORT TERM GOAL #1   Title Pt will demo consistency and independence with her HEP to improve BLE strength and balance.   Time 3   Period Weeks   Status New     PT SHORT TERM GOAL #2   Title Pt will demo improved single leg balance up to atleast 5 sec for 2/3 trials, without LOB, to improve safety with ambulation.    Time 3   Period Weeks   Status New           PT Long Term Goals - 02/14/17 1229      PT LONG TERM GOAL #1   Title Pt will demo improved BLE strenght to atleast 4/5 MMT, which will improve her tolerance and safety with  daily activity.   Time 6   Period Weeks   Status New     PT LONG TERM GOAL #2   Title Pt will demo improved safety with stair negotiation, evident by her ability to ascend and descend atleast 4, 6" steps without handrails, using reciprocal pattern 2/3 trials.    Time 6   Period Weeks   Status New     PT LONG TERM GOAL #3   Title Pt will score greater than 19 points on the DGI, to reflect improvements in her dynamic balance and decrease her risk of falling at home and in the community without an AD.    Time 6   Period Weeks   Status New     PT LONG TERM GOAL #4   Title Pt will complete single leg balance on each LE for atleast 10 sec, 2/3 trials, to decrease her risk of falling and causing injury to herself.   Time 6   Period Weeks   Status New               Plan - 02/18/17 1628    Clinical Impression Statement Today's session focused on review of several exercises from HEP provided at eval. Pt was able to demonstrate proper technique. Also worked on Conservation officer, nature activity, specifically single leg stance, to improve pt's safety with stair negotiation and activity at home. Pt was able to demonstrate single leg balance up to 7 sec on the Rt compared to her evaluation. Unstable surface was introduced with increased unsteadiness and use of UEs intermittently for support. Will continue with current POC.    Rehab Potential Good   Clinical Impairments Affecting Rehab Potential (+) motivated    PT Frequency 2x / week   PT Duration 6 weeks   PT Treatment/Interventions ADLs/Self Care Home Management;Balance training;Therapeutic exercise;Patient/family education;Functional mobility training;Stair training;Gait training;Neuromuscular re-education;Manual techniques;Passive range of motion   PT Next Visit Plan progress reps and resistance with hip strengthening exercise, single leg balance and balance on varied surfaces   PT Home Exercise Plan Supine bridge x10 reps, supine single leg  clamshells with red TB x10 reps each, standing heel/toe raises x20 reps    Consulted and Agree with Plan of Care Patient      Patient will benefit from skilled therapeutic intervention  in order to improve the following deficits and impairments:  Abnormal gait, Decreased activity tolerance, Decreased balance, Difficulty walking, Impaired flexibility, Hypomobility, Decreased strength, Decreased range of motion, Decreased mobility, Improper body mechanics  Visit Diagnosis: Unsteadiness on feet  Muscle weakness (generalized)  Other abnormalities of gait and mobility     Problem List Patient Active Problem List   Diagnosis Date Noted  . Aphasia 09/01/2015  . TIA (transient ischemic attack) 06/17/2015  . Osteoporosis 02/18/2015  . Normocytic anemia 12/13/2013  . Diabetes mellitus type II, uncontrolled ([place]) 12/10/2013  . Pneumonia 12/10/2013  . Sepsis ([place]) 12/09/2013  . Type 2 diabetes mellitus with insulin deficiency ([place])   . Hypertension   . Hypertriglyceridemia   . Dyslipidemia      4:34 PM,02/18/17 Elly Modena PT, DPT [facility] [address] [place], [place], [address] Phone: [phone]   Fax:  [phone]  Name: Rebecca Perkins MRN: [id number] Date of Birth: [birth year]

## 2017-02-22 ENCOUNTER — Ambulatory Visit (HOSPITAL_COMMUNITY): Payer: Medicare Other | Admitting: Physical Therapy

## 2017-02-22 DIAGNOSIS — R2681 Unsteadiness on feet: Secondary | ICD-10-CM

## 2017-02-22 DIAGNOSIS — R2689 Other abnormalities of gait and mobility: Secondary | ICD-10-CM

## 2017-02-22 DIAGNOSIS — M6281 Muscle weakness (generalized): Secondary | ICD-10-CM

## 2017-02-22 NOTE — Therapy (Signed)
[place], [place], [address] Phone: [phone]   Fax:  [phone]  Physical Therapy Treatment  Patient Details  Name: Rebecca Perkins MRN: [id number] Date of Birth: 13-Jul-1929 Referring Provider: Jenna Luo, MD   Encounter Date: 02/22/2017      PT End of Session - 02/22/17 1028    Visit Number 3   Number of Visits 13   Date for PT Re-Evaluation 03/07/17   Authorization Type UHC Medicare    Authorization Time Period 02/14/17 to 04/01/17   PT Start Time 0945   PT Stop Time 1027   PT Time Calculation (min) 42 min   Activity Tolerance Patient tolerated treatment well   Behavior During Therapy [facility] for tasks assessed/performed      Past Medical History:  Diagnosis Date  . Anemia   . Diabetes mellitus   . Dyslipidemia   . GERD (gastroesophageal reflux disease)   . Hypertension   . Migraines   . Osteoporosis   . PVD (peripheral vascular disease) ([place])    abi .83 (L), .92 (R)  . RSD (reflex sympathetic dystrophy) 2007   R wrist/hand following fx  . Stroke ([place])   . Varicose veins     Past Surgical History:  Procedure Laterality Date  . ABDOMINAL HYSTERECTOMY  1988  . APPENDECTOMY  1966  . BREAST CYST EXCISION    . CATARACT EXTRACTION  10/2009  . CHOLECYSTECTOMY  1989  . Several benign cyst removed     last 1 in 1972  . TUBAL LIGATION    . UMBILICAL HERNIA REPAIR      There were no vitals filed for this visit.      Subjective Assessment - 02/22/17 0947    Subjective patient arrives she is doing well, she was sore from last session and no changes since last time    Pertinent History stroke 2016, PVD, osteoporosis, HTN, diabetes   Patient Stated Goals improve balance    Currently in Pain? Yes   Pain Score 3    Pain Location Other (Comment)  R UE/knee, back    Pain Orientation Right   Pain Descriptors / Indicators Other (Comment)  back is chronic; R UE aching; knee chronic    Pain Type  Chronic pain   Pain Radiating Towards none    Pain Onset More than a month ago   Pain Frequency Intermittent   Aggravating Factors  none, chronic    Pain Relieving Factors unknown    Effect of Pain on Daily Activities none, just annoying                          [facility] Adult PT Treatment/Exercise - 02/22/17 0001      Knee/Hip Exercises: Standing   Lateral Step Up Both;1 set;10 reps   Lateral Step Up Limitations 4 inch box    Forward Step Up Both;1 set;10 reps   Forward Step Up Limitations 4 inch box      Knee/Hip Exercises: Supine   Bridges Both;1 set;20 reps   Bridges Limitations 3 second holds    Straight Leg Raises Both;1 set;10 reps   Straight Leg Raises Limitations core set/eccentric lower      Knee/Hip Exercises: Sidelying   Hip ABduction Both;1 set;10 reps   Hip ABduction Limitations cues for form      Knee/Hip Exercises: Prone   Hip Extension Both;1 set;10 reps   Hip Extension Limitations cues for  form              Balance Exercises - 02/22/17 1009      Balance Exercises: Standing   Tandem Stance Eyes open;Foam/compliant surface;3 reps;20 secs   Rockerboard Anterior/posterior;Lateral;EO;Other (comment);Intermittent UE support  x2 min each way    Tandem Gait Forward;3 reps;Other (comment)  full laps in parallel bars    Heel Raises Limitations x10 on foam    Toe Raise Limitations x10 on foam            PT Education - 02/22/17 1027    Education provided Yes   Education Details possible DOMS, management strategies; HEP cmopliance    Person(s) Educated Patient   Methods Explanation   Comprehension Verbalized understanding          PT Short Term Goals - 02/14/17 1227      PT SHORT TERM GOAL #1   Title Pt will demo consistency and independence with her HEP to improve BLE strength and balance.   Time 3   Period Weeks   Status New     PT SHORT TERM GOAL #2   Title Pt will demo improved single leg balance up to atleast 5 sec for  2/3 trials, without LOB, to improve safety with ambulation.    Time 3   Period Weeks   Status New           PT Long Term Goals - 02/14/17 1229      PT LONG TERM GOAL #1   Title Pt will demo improved BLE strenght to atleast 4/5 MMT, which will improve her tolerance and safety with daily activity.   Time 6   Period Weeks   Status New     PT LONG TERM GOAL #2   Title Pt will demo improved safety with stair negotiation, evident by her ability to ascend and descend atleast 4, 6" steps without handrails, using reciprocal pattern 2/3 trials.    Time 6   Period Weeks   Status New     PT LONG TERM GOAL #3   Title Pt will score greater than 19 points on the DGI, to reflect improvements in her dynamic balance and decrease her risk of falling at home and in the community without an AD.    Time 6   Period Weeks   Status New     PT LONG TERM GOAL #4   Title Pt will complete single leg balance on each LE for atleast 10 sec, 2/3 trials, to decrease her risk of falling and causing injury to herself.   Time 6   Period Weeks   Status New               Plan - 02/22/17 1028    Clinical Impression Statement Patient arrives today very pleasant and doing well. Continued to work on strength and balance, progressing where appropriate and tolerated per POC. Introduced unsteady surfaces today for increased balance challenge with patient requiring min guard for safety. Overall patient doing very well with skilled PT Services and will benefit from ongoing skilled care to address functional impairments.    Rehab Potential Good   Clinical Impairments Affecting Rehab Potential (+) motivated    PT Frequency 2x / week   PT Duration 6 weeks   PT Treatment/Interventions ADLs/Self Care Home Management;Balance training;Therapeutic exercise;Patient/family education;Functional mobility training;Stair training;Gait training;Neuromuscular re-education;Manual techniques;Passive range of motion   PT Next  Visit Plan progress reps and resistance with hip strengthening exercise, single leg balance  and balance on varied surfaces   PT Home Exercise Plan Supine bridge x10 reps, supine single leg clamshells with red TB x10 reps each, standing heel/toe raises x20 reps    Consulted and Agree with Plan of Care Patient      Patient will benefit from skilled therapeutic intervention in order to improve the following deficits and impairments:  Abnormal gait, Decreased activity tolerance, Decreased balance, Difficulty walking, Impaired flexibility, Hypomobility, Decreased strength, Decreased range of motion, Decreased mobility, Improper body mechanics  Visit Diagnosis: Unsteadiness on feet  Muscle weakness (generalized)  Other abnormalities of gait and mobility     Problem List Patient Active Problem List   Diagnosis Date Noted  . Aphasia 09/01/2015  . TIA (transient ischemic attack) 06/17/2015  . Osteoporosis 02/18/2015  . Normocytic anemia 12/13/2013  . Diabetes mellitus type II, uncontrolled ([place]) 12/10/2013  . Pneumonia 12/10/2013  . Sepsis ([place]) 12/09/2013  . Type 2 diabetes mellitus with insulin deficiency ([place])   . Hypertension   . Hypertriglyceridemia   . Dyslipidemia     Deniece Ree PT, DPT [phone]  [place] [address] [place], [place], [address] Phone: [phone]   Fax:  [phone]  Name: Rebecca Perkins MRN: [id number] Date of Birth: [birth year]

## 2017-02-23 ENCOUNTER — Ambulatory Visit (INDEPENDENT_AMBULATORY_CARE_PROVIDER_SITE_OTHER): Payer: Medicare Other | Admitting: Podiatry

## 2017-02-23 DIAGNOSIS — M79676 Pain in unspecified toe(s): Secondary | ICD-10-CM

## 2017-02-23 DIAGNOSIS — B351 Tinea unguium: Secondary | ICD-10-CM

## 2017-02-23 NOTE — Progress Notes (Signed)
Subjective:    Patient ID: Rebecca Perkins, female   DOB: 83 y.o.   MRN: [id number]   HPI patient presents with elongated nailbeds 1-5 both feet that are thick yellow and painful    ROS      Objective:  Physical Exam neurovascular status unchanged with thick yellow brittle nailbeds 1-5 both feet     Assessment:    Mycotic nail infection with pain 1-5 both feet     Plan:    Debris painful nailbeds 1-5 both feet with no iatrogenic bleeding noted

## 2017-02-24 ENCOUNTER — Encounter (HOSPITAL_COMMUNITY): Payer: Self-pay

## 2017-02-24 ENCOUNTER — Ambulatory Visit (HOSPITAL_COMMUNITY): Payer: Medicare Other

## 2017-02-24 DIAGNOSIS — R2681 Unsteadiness on feet: Secondary | ICD-10-CM | POA: Diagnosis not present

## 2017-02-24 DIAGNOSIS — M6281 Muscle weakness (generalized): Secondary | ICD-10-CM

## 2017-02-24 DIAGNOSIS — R2689 Other abnormalities of gait and mobility: Secondary | ICD-10-CM

## 2017-02-24 NOTE — Therapy (Signed)
[place], [place], [address] Phone: [phone]   Fax:  [phone]  Physical Therapy Treatment  Patient Details  Name: Rebecca Perkins MRN: [id number] Date of Birth: 1928/09/26 Referring Provider: Jenna Luo, MD   Encounter Date: 02/24/2017      PT End of Session - 02/24/17 1026    Visit Number 4   Number of Visits 13   Date for PT Re-Evaluation 03/07/17   Authorization Type UHC Medicare    Authorization Time Period 02/14/17 to 04/01/17   PT Start Time 1025   PT Stop Time 1106   PT Time Calculation (min) 41 min   Activity Tolerance Patient tolerated treatment well   Behavior During Therapy [facility] for tasks assessed/performed      Past Medical History:  Diagnosis Date  . Anemia   . Diabetes mellitus   . Dyslipidemia   . GERD (gastroesophageal reflux disease)   . Hypertension   . Migraines   . Osteoporosis   . PVD (peripheral vascular disease) ([place])    abi .83 (L), .92 (R)  . RSD (reflex sympathetic dystrophy) 2007   R wrist/hand following fx  . Stroke ([place])   . Varicose veins     Past Surgical History:  Procedure Laterality Date  . ABDOMINAL HYSTERECTOMY  1988  . APPENDECTOMY  1966  . BREAST CYST EXCISION    . CATARACT EXTRACTION  10/2009  . CHOLECYSTECTOMY  1989  . Several benign cyst removed     last 1 in 1972  . TUBAL LIGATION    . UMBILICAL HERNIA REPAIR      There were no vitals filed for this visit.      Subjective Assessment - 02/24/17 1026    Subjective Pt states that she is moving slow this morning. She took a nap before she came. She denies any falls or close calls.   Pertinent History stroke 2016, PVD, osteoporosis, HTN, diabetes   Patient Stated Goals improve balance    Currently in Pain? Yes   Pain Score 4    Pain Location Back   Pain Orientation Lower;Right;Left   Pain Descriptors / Indicators --  hurts all the time   Pain Onset More than a month ago   Pain  Frequency Constant   Aggravating Factors  none, chronic   Pain Relieving Factors unknown   Effect of Pain on Daily Activities none, just annoying              [facility] Adult PT Treatment/Exercise - 02/24/17 0001      Knee/Hip Exercises: Standing   Functional Squat 2 sets;10 reps  chair behind for form     Knee/Hip Exercises: Seated   Sit to Sand 15 reps;without UE support  GTB around knees     Knee/Hip Exercises: Supine   Bridges Both;1 set;20 reps   Bridges Limitations GTB, 3 sec hold         Balance Exercises - 02/24/17 1050      Balance Exercises: Standing   SLS Eyes open;Foam/compliant surface;Intermittent upper extremity support;3 reps;10 secs  BLE   SLS with Vectors Solid surface;Intermittent upper extremity assist;3 reps  3 sec holds each   Tandem Gait Forward;Intermittent upper extremity support;Foam/compliant surface;3 reps  in // bars   Sidestepping Foam/compliant support;3 reps  in // bars              PT Education - 02/24/17 1107    Education provided Yes   Education  Details exercise technique, continue HEP   Person(s) Educated Patient   Methods Explanation;Demonstration   Comprehension Verbalized understanding;Returned demonstration          PT Short Term Goals - 02/14/17 1227      PT SHORT TERM GOAL #1   Title Pt will demo consistency and independence with her HEP to improve BLE strength and balance.   Time 3   Period Weeks   Status New     PT SHORT TERM GOAL #2   Title Pt will demo improved single leg balance up to atleast 5 sec for 2/3 trials, without LOB, to improve safety with ambulation.    Time 3   Period Weeks   Status New           PT Long Term Goals - 02/14/17 1229      PT LONG TERM GOAL #1   Title Pt will demo improved BLE strenght to atleast 4/5 MMT, which will improve her tolerance and safety with daily activity.   Time 6   Period Weeks   Status New     PT LONG TERM GOAL #2   Title Pt will demo improved  safety with stair negotiation, evident by her ability to ascend and descend atleast 4, 6" steps without handrails, using reciprocal pattern 2/3 trials.    Time 6   Period Weeks   Status New     PT LONG TERM GOAL #3   Title Pt will score greater than 19 points on the DGI, to reflect improvements in her dynamic balance and decrease her risk of falling at home and in the community without an AD.    Time 6   Period Weeks   Status New     PT LONG TERM GOAL #4   Title Pt will complete single leg balance on each LE for atleast 10 sec, 2/3 trials, to decrease her risk of falling and causing injury to herself.   Time 6   Period Weeks   Status New               Plan - 02/24/17 1108    Clinical Impression Statement Began therex for functional hip strengthening, which pt did well with but did have some fatigue. Followed up with dynamic balance activities, progressing pt from last session. She required CGA to min A throughout for steadiness. Continue POC as planned.   Rehab Potential Good   Clinical Impairments Affecting Rehab Potential (+) motivated    PT Frequency 2x / week   PT Duration 6 weeks   PT Treatment/Interventions ADLs/Self Care Home Management;Balance training;Therapeutic exercise;Patient/family education;Functional mobility training;Stair training;Gait training;Neuromuscular re-education;Manual techniques;Passive range of motion   PT Next Visit Plan progress reps and resistance with hip strengthening exercise, single leg balance and balance on varied surfaces; add sidestepping on foam over hurdles, step ups on 4" step with foam   PT Home Exercise Plan Supine bridge x10 reps, supine single leg clamshells with red TB x10 reps each, standing heel/toe raises x20 reps    Consulted and Agree with Plan of Care Patient      Patient will benefit from skilled therapeutic intervention in order to improve the following deficits and impairments:  Abnormal gait, Decreased activity  tolerance, Decreased balance, Difficulty walking, Impaired flexibility, Hypomobility, Decreased strength, Decreased range of motion, Decreased mobility, Improper body mechanics  Visit Diagnosis: Unsteadiness on feet  Muscle weakness (generalized)  Other abnormalities of gait and mobility     Problem List Patient Active Problem  List   Diagnosis Date Noted  . Aphasia 09/01/2015  . TIA (transient ischemic attack) 06/17/2015  . Osteoporosis 02/18/2015  . Normocytic anemia 12/13/2013  . Diabetes mellitus type II, uncontrolled ([place]) 12/10/2013  . Pneumonia 12/10/2013  . Sepsis ([place]) 12/09/2013  . Type 2 diabetes mellitus with insulin deficiency ([place])   . Hypertension   . Hypertriglyceridemia   . Dyslipidemia      Geraldine Solar PT, [place] [address] [place], [place], [address] Phone: [phone]   Fax:  [phone]  Name: IZA PRESTON MRN: [id number] Date of Birth: 03/20/1929

## 2017-03-01 ENCOUNTER — Ambulatory Visit (HOSPITAL_COMMUNITY): Payer: Medicare Other

## 2017-03-01 ENCOUNTER — Encounter (HOSPITAL_COMMUNITY): Payer: Self-pay

## 2017-03-01 DIAGNOSIS — R2689 Other abnormalities of gait and mobility: Secondary | ICD-10-CM

## 2017-03-01 DIAGNOSIS — M6281 Muscle weakness (generalized): Secondary | ICD-10-CM

## 2017-03-01 DIAGNOSIS — R2681 Unsteadiness on feet: Secondary | ICD-10-CM | POA: Diagnosis not present

## 2017-03-01 NOTE — Therapy (Signed)
[place], [place], [address] Phone: [phone]   Fax:  [phone]  Physical Therapy Treatment  Patient Details  Name: Rebecca Perkins MRN: [id number] Date of Birth: [birth year] Referring Provider: Jenna Luo, MD   Encounter Date: 03/01/2017      PT End of Session - 03/01/17 1102    Visit Number 5   Number of Visits 13   Date for PT Re-Evaluation 03/07/17   Authorization Type UHC Medicare    Authorization Time Period 02/14/17 to 04/01/17   PT Start Time 1102   PT Stop Time 1147   PT Time Calculation (min) 45 min   Activity Tolerance Patient tolerated treatment well   Behavior During Therapy [facility] for tasks assessed/performed      Past Medical History:  Diagnosis Date  . Anemia   . Diabetes mellitus   . Dyslipidemia   . GERD (gastroesophageal reflux disease)   . Hypertension   . Migraines   . Osteoporosis   . PVD (peripheral vascular disease) ([place])    abi .83 (L), .92 (R)  . RSD (reflex sympathetic dystrophy) 2007   R wrist/hand following fx  . Stroke ([place])   . Varicose veins     Past Surgical History:  Procedure Laterality Date  . ABDOMINAL HYSTERECTOMY  1988  . APPENDECTOMY  1966  . BREAST CYST EXCISION    . CATARACT EXTRACTION  10/2009  . CHOLECYSTECTOMY  1989  . Several benign cyst removed     last 1 in 1972  . TUBAL LIGATION    . UMBILICAL HERNIA REPAIR      There were no vitals filed for this visit.      Subjective Assessment - 03/01/17 1102    Subjective Pt states that she is moving slow this morning. She states that she had a tough weekend but feels better today.    Pertinent History stroke 2016, PVD, osteoporosis, HTN, diabetes   Patient Stated Goals improve balance    Currently in Pain? Yes   Pain Score 4    Pain Location Back   Pain Orientation Lower;Right;Left   Pain Descriptors / Indicators Sore   Pain Type Chronic pain   Pain Onset More than a month ago   Pain  Frequency Constant   Aggravating Factors  none, chronic,   Pain Relieving Factors unknown   Effect of Pain on Daily Activities none, just annoying              [facility] Adult PT Treatment/Exercise - 03/01/17 0001      Knee/Hip Exercises: Standing   Functional Squat 1 set;15 reps   Functional Squat Limitations bil 3# DB   Other Standing Knee Exercises sidestepping with RTB 100ftx2RT     Knee/Hip Exercises: Seated   Sit to Sand 2 sets;15 reps;without UE support  GTB around knees         Balance Exercises - 03/01/17 1120      Balance Exercises: Standing   SLS Eyes open;Foam/compliant surface;Intermittent upper extremity support;5 reps;10 secs   Step Ups Forward;4 inch;Intermittent UE support  + foam pad on top x 10 reps BLE   Tandem Gait Forward;Retro;Intermittent upper extremity support;Foam/compliant surface;5 reps  cues for bigger steps with LLE   Sidestepping Foam/compliant support;3 reps  with (4) 6" hurdles             PT Education - 03/01/17 1148    Education provided Yes   Education Details exercise technique, bigger  steps with retro tandem gait   Person(s) Educated Patient   Methods Explanation;Demonstration   Comprehension Verbalized understanding;Returned demonstration          PT Short Term Goals - 02/14/17 1227      PT SHORT TERM GOAL #1   Title Pt will demo consistency and independence with her HEP to improve BLE strength and balance.   Time 3   Period Weeks   Status New     PT SHORT TERM GOAL #2   Title Pt will demo improved single leg balance up to atleast 5 sec for 2/3 trials, without LOB, to improve safety with ambulation.    Time 3   Period Weeks   Status New           PT Long Term Goals - 02/14/17 1229      PT LONG TERM GOAL #1   Title Pt will demo improved BLE strenght to atleast 4/5 MMT, which will improve her tolerance and safety with daily activity.   Time 6   Period Weeks   Status New     PT LONG TERM GOAL #2   Title  Pt will demo improved safety with stair negotiation, evident by her ability to ascend and descend atleast 4, 6" steps without handrails, using reciprocal pattern 2/3 trials.    Time 6   Period Weeks   Status New     PT LONG TERM GOAL #3   Title Pt will score greater than 19 points on the DGI, to reflect improvements in her dynamic balance and decrease her risk of falling at home and in the community without an AD.    Time 6   Period Weeks   Status New     PT LONG TERM GOAL #4   Title Pt will complete single leg balance on each LE for atleast 10 sec, 2/3 trials, to decrease her risk of falling and causing injury to herself.   Time 6   Period Weeks   Status New               Plan - 03/01/17 1149    Clinical Impression Statement Continued to focus on improving functional BLE strength as well as dynamic balance. Pt tolerated increased reps and weights with therex but continues to demo fatigue. She required CGA thorughout NMR for steadiness and was challenged by retro gait on foam. Continue POC as planned.   Rehab Potential Good   Clinical Impairments Affecting Rehab Potential (+) motivated    PT Frequency 2x / week   PT Duration 6 weeks   PT Treatment/Interventions ADLs/Self Care Home Management;Balance training;Therapeutic exercise;Patient/family education;Functional mobility training;Stair training;Gait training;Neuromuscular re-education;Manual techniques;Passive range of motion   PT Next Visit Plan progress reps and resistance with hip strengthening exercise, single leg balance and balance on varied surfaces; Continue to progress balance on compliant surface, add in dynamice UE movements to further challenge balance; trial EC   PT Home Exercise Plan Supine bridge x10 reps, supine single leg clamshells with red TB x10 reps each, standing heel/toe raises x20 reps    Consulted and Agree with Plan of Care Patient      Patient will benefit from skilled therapeutic intervention in  order to improve the following deficits and impairments:  Abnormal gait, Decreased activity tolerance, Decreased balance, Difficulty walking, Impaired flexibility, Hypomobility, Decreased strength, Decreased range of motion, Decreased mobility, Improper body mechanics  Visit Diagnosis: Unsteadiness on feet  Muscle weakness (generalized)  Other abnormalities of gait and  mobility     Problem List Patient Active Problem List   Diagnosis Date Noted  . Aphasia 09/01/2015  . TIA (transient ischemic attack) 06/17/2015  . Osteoporosis 02/18/2015  . Normocytic anemia 12/13/2013  . Diabetes mellitus type II, uncontrolled ([place]) 12/10/2013  . Pneumonia 12/10/2013  . Sepsis ([place]) 12/09/2013  . Type 2 diabetes mellitus with insulin deficiency ([place])   . Hypertension   . Hypertriglyceridemia   . Dyslipidemia      Geraldine Solar PT, [place] [address] [place], [place], [address] Phone: [phone]   Fax:  [phone]  Name: Rebecca Perkins MRN: [id number] Date of Birth: October 12, 1928

## 2017-03-03 ENCOUNTER — Telehealth (HOSPITAL_COMMUNITY): Payer: Self-pay | Admitting: Family Medicine

## 2017-03-03 ENCOUNTER — Ambulatory Visit (HOSPITAL_COMMUNITY): Payer: Medicare Other | Admitting: Physical Therapy

## 2017-03-03 NOTE — Telephone Encounter (Signed)
03/03/17  it was pouring rain and she left a message to say that she didn't feel comfortable driving here and she didn't have anyone to bring her

## 2017-03-08 ENCOUNTER — Encounter (HOSPITAL_COMMUNITY): Payer: Self-pay

## 2017-03-08 ENCOUNTER — Ambulatory Visit (HOSPITAL_COMMUNITY): Payer: Medicare Other | Attending: Family Medicine

## 2017-03-08 DIAGNOSIS — R2681 Unsteadiness on feet: Secondary | ICD-10-CM | POA: Insufficient documentation

## 2017-03-08 DIAGNOSIS — R2689 Other abnormalities of gait and mobility: Secondary | ICD-10-CM | POA: Insufficient documentation

## 2017-03-08 DIAGNOSIS — M6281 Muscle weakness (generalized): Secondary | ICD-10-CM | POA: Insufficient documentation

## 2017-03-08 NOTE — Therapy (Signed)
[place], [place], [address] Phone: [phone]   Fax:  [phone]  Physical Therapy Treatment  Patient Details  Name: Rebecca Perkins MRN: [id number] Date of Birth: 18-Feb-1929 Referring Provider: Jenna Luo, MD   Encounter Date: 03/08/2017      PT End of Session - 03/08/17 1115    Visit Number 6   Number of Visits 13   Date for PT Re-Evaluation 03/07/17   Authorization Type U[employer] Medicare (g-codes done on 10-13-22 visit)   Authorization Time Period 02/14/17 to 04/01/17   PT Start Time 1115   PT Stop Time 1158   PT Time Calculation (min) 43 min   Activity Tolerance Patient tolerated treatment well   Behavior During Therapy [facility] for tasks assessed/performed      Past Medical History:  Diagnosis Date  . Anemia   . Diabetes mellitus   . Dyslipidemia   . GERD (gastroesophageal reflux disease)   . Hypertension   . Migraines   . Osteoporosis   . PVD (peripheral vascular disease) ([place])    abi .83 (L), .92 (R)  . RSD (reflex sympathetic dystrophy) 2007   R wrist/hand following fx  . Stroke ([place])   . Varicose veins     Past Surgical History:  Procedure Laterality Date  . ABDOMINAL HYSTERECTOMY  1988  . APPENDECTOMY  1966  . BREAST CYST EXCISION    . CATARACT EXTRACTION  10/2009  . CHOLECYSTECTOMY  1989  . Several benign cyst removed     last 1 in 1972  . TUBAL LIGATION    . UMBILICAL HERNIA REPAIR      There were no vitals filed for this visit.      Subjective Assessment - 03/08/17 1116    Subjective Pt states that she feels okay this morning. She states that she is just a little stiff this morning.    Pertinent History stroke 2016, PVD, osteoporosis, HTN, diabetes   Patient Stated Goals improve balance    Currently in Pain? Yes   Pain Score 4    Pain Location Back   Pain Orientation Lower;Right   Pain Descriptors / Indicators Sore   Pain Type Chronic pain   Pain Onset More than a month ago    Pain Frequency Intermittent   Aggravating Factors  none, chronic   Pain Relieving Factors unknown   Effect of Pain on Daily Activities none, just annoying             [facility] PT Assessment - 03/08/17 0001      Strength   Right Hip Flexion 4/5  was 4-   Right Hip Extension 4-/5  was 3   Right Hip ABduction 4/5  was 4-   Left Hip Flexion 4-/5  was 4-   Left Hip Extension 3+/5  was 3   Left Hip ABduction 4/5  was 4-   Right Knee Flexion 4+/5  was 3+   Left Knee Flexion 4+/5  was 3+   Right Ankle Dorsiflexion 4+/5  was 4+   Left Ankle Dorsiflexion 4/5  was 4     Balance   Balance Assessed Yes     Static Standing Balance   Static Standing - Balance Support No upper extremity supported   Static Standing Balance -  Activities  Single Leg Stance - Right Leg;Single Leg Stance - Left Leg   Static Standing - Comment/# of Minutes R:  L:     Dynamic Gait Index  Level Surface Mild Impairment   Change in Gait Speed Mild Impairment   Gait with Horizontal Head Turns Mild Impairment   Gait with Vertical Head Turns Normal   Gait and Pivot Turn Normal   Step Over Obstacle Mild Impairment   Step Around Obstacles Normal   Steps Mild Impairment   Total Score 19             [facility] Adult PT Treatment/Exercise - 03/08/17 0001      Knee/Hip Exercises: Standing   Other Standing Knee Exercises sidestepping with GTB 25f x2RT     Knee/Hip Exercises: Seated   Sit to Sand 1 set;15 reps;without UE support  GTB around knees (added to HEP)                PT Education - 03/08/17 1159    Education provided Yes   Education Details reassessment findings, updated HEP, importance of compliance with HEP   Person(s) Educated Patient   Methods Explanation;Demonstration;Handout   Comprehension Verbalized understanding;Returned demonstration          PT Short Term Goals - 03/08/17 1122      PT SHORT TERM GOAL #1   Title Pt will demo consistency and independence with her  HEP to improve BLE strength and balance.   Baseline 8/7: not performing consistently as instructed   Time 3   Period Weeks   Status On-going     PT SHORT TERM GOAL #2   Title Pt will demo improved single leg balance up to atleast 5 sec for 2/3 trials, without LOB, to improve safety with ambulation.    Baseline 8/7: LLE: 4, 2, and 5 seconds; RLE: 10, 9, 6 seconds   Time 3   Period Weeks   Status Partially Met           PT Long Term Goals - 03/08/17 1142      PT LONG TERM GOAL #1   Title Pt will demo improved BLE strenght to atleast 4/5 MMT, which will improve her tolerance and safety with daily activity.   Baseline 8/7: pt has made improvements in almost all of her MMT, but is still deficient in some muscle groups   Time 6   Period Weeks   Status Partially Met     PT LONG TERM GOAL #2   Title Pt will demo improved safety with stair negotiation, evident by her ability to ascend and descend atleast 4, 6" steps without handrails, using reciprocal pattern 2/3 trials.    Baseline 8/7: uses handrails for steadiness   Time 6   Period Weeks   Status On-going     PT LONG TERM GOAL #3   Title Pt will score greater than 19 points on the DGI, to reflect improvements in her dynamic balance and decrease her risk of falling at home and in the community without an AD.    Baseline 8/7: 19/24 this date   Time 6   Period Weeks   Status Achieved     PT LONG TERM GOAL #4   Title Pt will complete single leg balance on each LE for atleast 10 sec, 2/3 trials, to decrease her risk of falling and causing injury to herself.   Baseline 8/7: LLE: 4, 2, and 5 seconds; RLE: 10, 9, 6 seconds   Time 6   Period Weeks   Status On-going               Plan - 03/08/17 1200    Clinical  Impression Statement PT reassessed pt's goals and outcome measures this date. Pt has made progress towards all goals, meeting 1, partially meeting 2, with all others on-going. Pt verbalizes that she feels more steady  when she is walking out in the community now since starting therapy. Pt scored 19/24 on the DGI this date and she was a 16/24 at initial eval. Her overall strength and balance are her main limiting factors as her proximal hip strength was still deficient and her SLS is still impaired, L>R. POC will be continued as planned until she is reassessed again at the end of her cert.    Rehab Potential Good   Clinical Impairments Affecting Rehab Potential (+) motivated    PT Frequency 2x / week   PT Duration 6 weeks   PT Treatment/Interventions ADLs/Self Care Home Management;Balance training;Therapeutic exercise;Patient/family education;Functional mobility training;Stair training;Gait training;Neuromuscular re-education;Manual techniques;Passive range of motion   PT Next Visit Plan progress reps and resistance with hip strengthening exercise, single leg balance and balance on varied surfaces; Continue to progress balance on compliant surface, add in dynamice UE movements to further challenge balance; trial EC   PT Home Exercise Plan Supine bridge x10 reps, supine single leg clamshells with red TB x10 reps each, standing heel/toe raises x20 reps; 03/15/23: sit <> stands with GTB, sidestepping with GTB   Consulted and Agree with Plan of Care Patient      Patient will benefit from skilled therapeutic intervention in order to improve the following deficits and impairments:  Abnormal gait, Decreased activity tolerance, Decreased balance, Difficulty walking, Impaired flexibility, Hypomobility, Decreased strength, Decreased range of motion, Decreased mobility, Improper body mechanics  Visit Diagnosis: Unsteadiness on feet  Muscle weakness (generalized)  Other abnormalities of gait and mobility       G-Codes - 03-14-2017 1206    Functional Assessment Tool Used (Outpatient Only) FOTO, DGI, MMT, SLS   Functional Limitation Mobility: Walking and moving around   Mobility: Walking and Moving Around Current Status  [phone]) At least 40 percent but less than 60 percent impaired, limited or restricted   Mobility: Walking and Moving Around Goal Status [phone]) At least 20 percent but less than 40 percent impaired, limited or restricted      Problem List Patient Active Problem List   Diagnosis Date Noted  . Aphasia 09/01/2015  . TIA (transient ischemic attack) 06/17/2015  . Osteoporosis 02/18/2015  . Normocytic anemia 12/13/2013  . Diabetes mellitus type II, uncontrolled ([place]) 12/10/2013  . Pneumonia 12/10/2013  . Sepsis ([place]) 12/09/2013  . Type 2 diabetes mellitus with insulin deficiency ([place])   . Hypertension   . Hypertriglyceridemia   . Dyslipidemia      Geraldine Solar PT, [place] [address] [place], [place], [address] Phone: [phone]   Fax:  [phone]  Name: Rebecca Perkins MRN: [id number] Date of Birth: [birth year]

## 2017-03-08 NOTE — Patient Instructions (Signed)
  SIT TO STAND - NO SUPPORT  Start by scooting close to the front of the chair.  Next, lean forward at your trunk and reach forward with your arms and rise to standing without using your hands to push off from the chair or other object.   Use your arms as a counter-balance by reaching forward when in sitting and lower them as you approach standing.   Perform 1x/day, 2-3 sets of 10-15 reps. Place the green band around your knees while performing this.   ELASTIC BAND LATERAL WALKS   With an elastic band around both ankles, walk side to side while keeping your feet spread apart. Keep your knees STRAIGHT the entire time. (his are bent in the picture but keep yours straight.)  Perform 1x/day, 5-10 laps at your kitchen counter with the green or red band.

## 2017-03-10 ENCOUNTER — Ambulatory Visit (HOSPITAL_COMMUNITY): Payer: Medicare Other | Admitting: Physical Therapy

## 2017-03-10 DIAGNOSIS — R2681 Unsteadiness on feet: Secondary | ICD-10-CM | POA: Diagnosis not present

## 2017-03-10 DIAGNOSIS — M6281 Muscle weakness (generalized): Secondary | ICD-10-CM

## 2017-03-10 DIAGNOSIS — R2689 Other abnormalities of gait and mobility: Secondary | ICD-10-CM

## 2017-03-10 NOTE — Therapy (Signed)
[place], [place], [address] Phone: [phone]   Fax:  [phone]  Physical Therapy Treatment  Patient Details  Name: Rebecca Perkins MRN: [id number] Date of Birth: 03-25-1929 Referring Provider: Jenna Luo, MD   Encounter Date: 03/10/2017      PT End of Session - 03/10/17 1158    Visit Number 7   Number of Visits 13   Date for PT Re-Evaluation 03/28/17   Authorization Type UHC Medicare (g-codes done on 10-08-2022 visit)   Authorization Time Period 02/14/17 to 04/01/17   Authorization - Visit Number 7   Authorization - Number of Visits 16   PT Start Time 1115   PT Stop Time 1155   PT Time Calculation (min) 40 min   Equipment Utilized During Treatment Gait belt   Activity Tolerance Patient tolerated treatment well   Behavior During Therapy [facility] for tasks assessed/performed      Past Medical History:  Diagnosis Date  . Anemia   . Diabetes mellitus   . Dyslipidemia   . GERD (gastroesophageal reflux disease)   . Hypertension   . Migraines   . Osteoporosis   . PVD (peripheral vascular disease) ([place])    abi .83 (L), .92 (R)  . RSD (reflex sympathetic dystrophy) 2007   R wrist/hand following fx  . Stroke ([place])   . Varicose veins     Past Surgical History:  Procedure Laterality Date  . ABDOMINAL HYSTERECTOMY  1988  . APPENDECTOMY  1966  . BREAST CYST EXCISION    . CATARACT EXTRACTION  10/2009  . CHOLECYSTECTOMY  1989  . Several benign cyst removed     last 1 in 1972  . TUBAL LIGATION    . UMBILICAL HERNIA REPAIR      There were no vitals filed for this visit.      Subjective Assessment - 03/10/17 1116    Subjective Patient arrives stating she is doing well, no major changes and her back hurts    Pertinent History stroke 2016, PVD, osteoporosis, HTN, diabetes   Patient Stated Goals improve balance    Currently in Pain? Yes   Pain Score 4    Pain Location Back   Pain Orientation Right;Lower                          [facility] Adult PT Treatment/Exercise - 03/10/17 0001      Knee/Hip Exercises: Supine   Bridges Both;2 sets;20 reps   Bridges Limitations staggered position B    Straight Leg Raises Both;1 set;15 reps   Straight Leg Raises Limitations slow lower      Knee/Hip Exercises: Sidelying   Hip ABduction Both;1 set;15 reps   Hip ABduction Limitations cues and assist PRN for form      Knee/Hip Exercises: Prone   Hip Extension Both;1 set;15 reps   Hip Extension Limitations cues for form              Balance Exercises - 03/10/17 1135      Balance Exercises: Standing   Standing Eyes Closed Narrow base of support (BOS);Foam/compliant surface;Other (comment);4 reps  60 seconds   Tandem Stance Eyes open;Foam/compliant surface;3 reps;15 secs;Other (comment)  head turns    Other Standing Exercises tandem stance with UE bar raise L UE 3 rounds foam            PT Education - 03/10/17 1158    Education provided Yes  Education Details lumbar roll for back support   Person(s) Educated Patient   Methods Explanation   Comprehension Verbalized understanding          PT Short Term Goals - 03/08/17 1122      PT SHORT TERM GOAL #1   Title Pt will demo consistency and independence with her HEP to improve BLE strength and balance.   Baseline 8/7: not performing consistently as instructed   Time 3   Period Weeks   Status On-going     PT SHORT TERM GOAL #2   Title Pt will demo improved single leg balance up to atleast 5 sec for 2/3 trials, without LOB, to improve safety with ambulation.    Baseline 8/7: LLE: 4, 2, and 5 seconds; RLE: 10, 9, 6 seconds   Time 3   Period Weeks   Status Partially Met           PT Long Term Goals - 03/08/17 1142      PT LONG TERM GOAL #1   Title Pt will demo improved BLE strenght to atleast 4/5 MMT, which will improve her tolerance and safety with daily activity.   Baseline 8/7: pt has made improvements in  almost all of her MMT, but is still deficient in some muscle groups   Time 6   Period Weeks   Status Partially Met     PT LONG TERM GOAL #2   Title Pt will demo improved safety with stair negotiation, evident by her ability to ascend and descend atleast 4, 6" steps without handrails, using reciprocal pattern 2/3 trials.    Baseline 8/7: uses handrails for steadiness   Time 6   Period Weeks   Status On-going     PT LONG TERM GOAL #3   Title Pt will score greater than 19 points on the DGI, to reflect improvements in her dynamic balance and decrease her risk of falling at home and in the community without an AD.    Baseline 8/7: 19/24 this date   Time 6   Period Weeks   Status Achieved     PT LONG TERM GOAL #4   Title Pt will complete single leg balance on each LE for atleast 10 sec, 2/3 trials, to decrease her risk of falling and causing injury to herself.   Baseline 8/7: LLE: 4, 2, and 5 seconds; RLE: 10, 9, 6 seconds   Time 6   Period Weeks   Status On-going               Plan - 03/10/17 1158    Clinical Impression Statement Continued to progress hip strength with reps and weights as indicated in PT POC, otherwise continued to focus on functional balance activities this session, continuing to note that patient is challenged unsteady surfaces at this point. Also discussed use of towel roll for lumbar support to attempt to reduce lumbar pain moving forward.    Rehab Potential Good   Clinical Impairments Affecting Rehab Potential (+) motivated    PT Frequency 2x / week   PT Duration 6 weeks   PT Treatment/Interventions ADLs/Self Care Home Management;Balance training;Therapeutic exercise;Patient/family education;Functional mobility training;Stair training;Gait training;Neuromuscular re-education;Manual techniques;Passive range of motion   PT Next Visit Plan progress reps and resistance with hip strengthening exercise, single leg balance and balance on varied surfaces; Continue  to progress balance on compliant surface, add in dynamice UE movements to further challenge balance; trial EC   PT Home Exercise Plan Supine bridge x10  reps, supine single leg clamshells with red TB x10 reps each, standing heel/toe raises x20 reps; 8/7: sit <> stands with GTB, sidestepping with GTB   Consulted and Agree with Plan of Care Patient      Patient will benefit from skilled therapeutic intervention in order to improve the following deficits and impairments:  Abnormal gait, Decreased activity tolerance, Decreased balance, Difficulty walking, Impaired flexibility, Hypomobility, Decreased strength, Decreased range of motion, Decreased mobility, Improper body mechanics  Visit Diagnosis: Unsteadiness on feet  Muscle weakness (generalized)  Other abnormalities of gait and mobility     Problem List Patient Active Problem List   Diagnosis Date Noted  . Aphasia 09/01/2015  . TIA (transient ischemic attack) 06/17/2015  . Osteoporosis 02/18/2015  . Normocytic anemia 12/13/2013  . Diabetes mellitus type II, uncontrolled ([place]) 12/10/2013  . Pneumonia 12/10/2013  . Sepsis ([place]) 12/09/2013  . Type 2 diabetes mellitus with insulin deficiency ([place])   . Hypertension   . Hypertriglyceridemia   . Dyslipidemia     Deniece Ree PT, DPT [phone]  [facility] [address] [place], [place], [address] Phone: [phone]   Fax:  [phone]  Name: Rebecca Perkins MRN: [id number] Date of Birth: 1928/10/14

## 2017-03-15 ENCOUNTER — Ambulatory Visit (HOSPITAL_COMMUNITY): Payer: Medicare Other | Admitting: Physical Therapy

## 2017-03-15 DIAGNOSIS — R2681 Unsteadiness on feet: Secondary | ICD-10-CM | POA: Diagnosis not present

## 2017-03-15 DIAGNOSIS — M6281 Muscle weakness (generalized): Secondary | ICD-10-CM

## 2017-03-15 DIAGNOSIS — R2689 Other abnormalities of gait and mobility: Secondary | ICD-10-CM

## 2017-03-15 NOTE — Therapy (Signed)
[place], [place], [address] Phone: [phone]   Fax:  [phone]  Physical Therapy Treatment  Patient Details  Name: Rebecca Perkins MRN: [id number] Date of Birth: Apr 18, 1929 Referring Provider: Jenna Luo, MD   Encounter Date: 03/15/2017      PT End of Session - 03/15/17 1206    Visit Number 8   Number of Visits 13   Date for PT Re-Evaluation 03/28/17   Authorization Type [employer] Medicare (g-codes done on 06-Oct-2022 visit)   Authorization Time Period 02/14/17 to 04/01/17   Authorization - Visit Number 8   Authorization - Number of Visits 16   PT Start Time 5670   PT Stop Time 1158   PT Time Calculation (min) 41 min   Activity Tolerance Patient tolerated treatment well   Behavior During Therapy [facility] for tasks assessed/performed      Past Medical History:  Diagnosis Date  . Anemia   . Diabetes mellitus   . Dyslipidemia   . GERD (gastroesophageal reflux disease)   . Hypertension   . Migraines   . Osteoporosis   . PVD (peripheral vascular disease) ([place])    abi .83 (L), .92 (R)  . RSD (reflex sympathetic dystrophy) 2007   R wrist/hand following fx  . Stroke ([place])   . Varicose veins     Past Surgical History:  Procedure Laterality Date  . ABDOMINAL HYSTERECTOMY  1988  . APPENDECTOMY  1966  . BREAST CYST EXCISION    . CATARACT EXTRACTION  10/2009  . CHOLECYSTECTOMY  1989  . Several benign cyst removed     last 1 in 1972  . TUBAL LIGATION    . UMBILICAL HERNIA REPAIR      There were no vitals filed for this visit.      Subjective Assessment - 03/15/17 1120    Subjective Patient arrives stating that she is doing OK, her back is bothering her and it is just a "slugglish day"; one of her good friends passed away recently    Pertinent History stroke 2016, PVD, osteoporosis, HTN, diabetes   Patient Stated Goals improve balance    Currently in Pain? Yes   Pain Score 5    Pain Location Back   Pain  Orientation Right;Lower   Pain Descriptors / Indicators Sore;Discomfort   Pain Type Chronic pain   Pain Radiating Towards none    Pain Onset More than a month ago   Pain Frequency Intermittent   Aggravating Factors  nothing    Pain Relieving Factors heat    Effect of Pain on Daily Activities just annoying                          [facility] Adult PT Treatment/Exercise - 03/15/17 0001      Knee/Hip Exercises: Supine   Bridges Both;2 sets;20 reps   Bridges Limitations staggered position B    Straight Leg Raises Both;1 set;15 reps   Straight Leg Raises Limitations slow lower    Other Supine Knee/Hip Exercises LE leg press aagainst PT body weight 1x10  B   Other Supine Knee/Hip Exercises functional supine to sit/sit to supine with correct mechanics      Knee/Hip Exercises: Sidelying   Hip ABduction Both;1 set;15 reps   Hip ABduction Limitations cues and assist PRN for form              Balance Exercises - 03/15/17 1143  Balance Exercises: Standing   Tandem Stance Eyes open;Foam/compliant surface;3 reps;20 secs   SLS Eyes open;Solid surface;3 reps;15 secs;Modified  fingertip    Tandem Gait Retro;2 reps  inside parallel bars    Retro Gait 2 reps;Other (comment)  in parallel bars            PT Education - 03/15/17 1206    Education provided Yes   Education Details correct mechancis for supine to sit/sit to supine    Person(s) Educated Patient   Methods Explanation   Comprehension Verbalized understanding          PT Short Term Goals - 03/08/17 1122      PT SHORT TERM GOAL #1   Title Pt will demo consistency and independence with her HEP to improve BLE strength and balance.   Baseline 8/7: not performing consistently as instructed   Time 3   Period Weeks   Status On-going     PT SHORT TERM GOAL #2   Title Pt will demo improved single leg balance up to atleast 5 sec for 2/3 trials, without LOB, to improve safety with ambulation.    Baseline  8/7: LLE: 4, 2, and 5 seconds; RLE: 10, 9, 6 seconds   Time 3   Period Weeks   Status Partially Met           PT Long Term Goals - 03/08/17 1142      PT LONG TERM GOAL #1   Title Pt will demo improved BLE strenght to atleast 4/5 MMT, which will improve her tolerance and safety with daily activity.   Baseline 8/7: pt has made improvements in almost all of her MMT, but is still deficient in some muscle groups   Time 6   Period Weeks   Status Partially Met     PT LONG TERM GOAL #2   Title Pt will demo improved safety with stair negotiation, evident by her ability to ascend and descend atleast 4, 6" steps without handrails, using reciprocal pattern 2/3 trials.    Baseline 8/7: uses handrails for steadiness   Time 6   Period Weeks   Status On-going     PT LONG TERM GOAL #3   Title Pt will score greater than 19 points on the DGI, to reflect improvements in her dynamic balance and decrease her risk of falling at home and in the community without an AD.    Baseline 8/7: 19/24 this date   Time 6   Period Weeks   Status Achieved     PT LONG TERM GOAL #4   Title Pt will complete single leg balance on each LE for atleast 10 sec, 2/3 trials, to decrease her risk of falling and causing injury to herself.   Baseline 8/7: LLE: 4, 2, and 5 seconds; RLE: 10, 9, 6 seconds   Time 6   Period Weeks   Status On-going               Plan - 03/15/17 1207    Clinical Impression Statement Continued functional hip strengthening and also discussed correct mechanics for supine to sit/sit to supine to assist in reducing back pain. Also continued to work on functional balance moving forward. Patient continues to do well with skilled PT services moving forward.    Rehab Potential Good   Clinical Impairments Affecting Rehab Potential (+) motivated    PT Frequency 2x / week   PT Duration 6 weeks   PT Treatment/Interventions ADLs/Self Care Home Management;Balance training;Therapeutic  exercise;Patient/family education;Functional mobility training;Stair training;Gait training;Neuromuscular re-education;Manual techniques;Passive range of motion   PT Next Visit Plan progress reps and resistance with hip strengthening exercise, single leg balance and balance on varied surfaces; Continue to progress balance on compliant surface, add in dynamice UE movements to further challenge balance; trial EC   PT Home Exercise Plan Supine bridge x10 reps, supine single leg clamshells with red TB x10 reps each, standing heel/toe raises x20 reps; 8/7: sit <> stands with GTB, sidestepping with GTB   Consulted and Agree with Plan of Care Patient      Patient will benefit from skilled therapeutic intervention in order to improve the following deficits and impairments:  Abnormal gait, Decreased activity tolerance, Decreased balance, Difficulty walking, Impaired flexibility, Hypomobility, Decreased strength, Decreased range of motion, Decreased mobility, Improper body mechanics  Visit Diagnosis: Unsteadiness on feet  Muscle weakness (generalized)  Other abnormalities of gait and mobility     Problem List Patient Active Problem List   Diagnosis Date Noted  . Aphasia 09/01/2015  . TIA (transient ischemic attack) 06/17/2015  . Osteoporosis 02/18/2015  . Normocytic anemia 12/13/2013  . Diabetes mellitus type II, uncontrolled ([place]) 12/10/2013  . Pneumonia 12/10/2013  . Sepsis ([place]) 12/09/2013  . Type 2 diabetes mellitus with insulin deficiency ([place])   . Hypertension   . Hypertriglyceridemia   . Dyslipidemia     Deniece Ree PT, DPT [phone]  [facility] [address] [place], [place], [address] Phone: [phone]   Fax:  [phone]  Name: Rebecca Perkins MRN: [id number] Date of Birth: 09-28-1928

## 2017-03-15 NOTE — Patient Instructions (Signed)
BED MOBILITY: Side-Lying to Sit    Lie on side, move legs to edge of bed. Push down with both hands while moving legs off bed to reach sitting position.    To get from sitting to side-lying, simply perform the opposite motion.      Copyright  VHI. All rights reserved.

## 2017-03-17 ENCOUNTER — Ambulatory Visit (HOSPITAL_COMMUNITY): Payer: Medicare Other

## 2017-03-22 ENCOUNTER — Ambulatory Visit (HOSPITAL_COMMUNITY): Payer: Medicare Other | Admitting: Physical Therapy

## 2017-03-22 DIAGNOSIS — M6281 Muscle weakness (generalized): Secondary | ICD-10-CM

## 2017-03-22 DIAGNOSIS — R2681 Unsteadiness on feet: Secondary | ICD-10-CM | POA: Diagnosis not present

## 2017-03-22 DIAGNOSIS — R2689 Other abnormalities of gait and mobility: Secondary | ICD-10-CM

## 2017-03-22 NOTE — Therapy (Signed)
[facility] [place], [place], [address] Phone: [phone]   Fax:  [phone]  Physical Therapy Treatment  Patient Details  Name: Rebecca Perkins MRN: [id number] Date of Birth: 11-30-1928 Referring Provider: Jenna Luo, MD   Encounter Date: 03/22/2017      PT End of Session - 03/22/17 1202    Visit Number 9   Number of Visits 13   Date for PT Re-Evaluation 03/28/17   Authorization Type [employer] (g-codes done on 08-27-22 visit)   Authorization Time Period 02/14/17 to 04/01/17   Authorization - Visit Number 9   Authorization - Number of Visits 16   PT Start Time 2947  Nustep not included in billing    PT Stop Time 1158   PT Time Calculation (min) 34 min   Equipment Utilized During Treatment Gait belt   Activity Tolerance Patient tolerated treatment well   Behavior During Therapy [facility] for tasks assessed/performed      Past Medical History:  Diagnosis Date  . Anemia   . Diabetes mellitus   . Dyslipidemia   . GERD (gastroesophageal reflux disease)   . Hypertension   . Migraines   . Osteoporosis   . PVD (peripheral vascular disease) ([place])    abi .83 (L), .92 (R)  . RSD (reflex sympathetic dystrophy) 2007   R wrist/hand following fx  . Stroke ([place])   . Varicose veins     Past Surgical History:  Procedure Laterality Date  . ABDOMINAL HYSTERECTOMY  1988  . APPENDECTOMY  1966  . BREAST CYST EXCISION    . CATARACT EXTRACTION  10/2009  . CHOLECYSTECTOMY  1989  . Several benign cyst removed     last 1 in 1972  . TUBAL LIGATION    . UMBILICAL HERNIA REPAIR      There were no vitals filed for this visit.      Subjective Assessment - 03/22/17 1126    Subjective Patient arrives stating no major changes, her back continues to hurt    Pertinent History stroke 2016, PVD, osteoporosis, HTN, diabetes   Patient Stated Goals improve balance    Currently in Pain? Yes   Pain Score 5    Pain Location Back   Pain  Orientation Right;Lower   Pain Descriptors / Indicators Sore;Discomfort   Pain Type Chronic pain   Pain Radiating Towards none    Pain Onset More than a month ago   Pain Frequency Intermittent   Aggravating Factors  nothing    Pain Relieving Factors heat    Effect of Pain on Daily Activities just annoying                          [facility] Adult PT Treatment/Exercise - 03/22/17 0001      Knee/Hip Exercises: Aerobic   Nustep Nustep resistance 1 60SPM x8 minutes   not included in billing              Balance Exercises - 03/22/17 1201      Balance Exercises: Standing   Tandem Stance Eyes open;Foam/compliant surface;5 reps;Other (comment)  yellow weighted ball trunk rotations x5 3 rounds each side    Other Standing Exercises seated ball toss on dynadisc with back off chair, LEs supported; standing catch and cross mildine boom whackers in tandem stance on and off of foam            PT Education - 03/22/17 1202  Education provided No          PT Short Term Goals - 03/08/17 1122      PT SHORT TERM GOAL #1   Title Pt will demo consistency and independence with her HEP to improve BLE strength and balance.   Baseline 8/7: not performing consistently as instructed   Time 3   Period Weeks   Status On-going     PT SHORT TERM GOAL #2   Title Pt will demo improved single leg balance up to atleast 5 sec for 2/3 trials, without LOB, to improve safety with ambulation.    Baseline 8/7: LLE: 4, 2, and 5 seconds; RLE: 10, 9, 6 seconds   Time 3   Period Weeks   Status Partially Met           PT Long Term Goals - 03/08/17 1142      PT LONG TERM GOAL #1   Title Pt will demo improved BLE strenght to atleast 4/5 MMT, which will improve her tolerance and safety with daily activity.   Baseline 8/7: pt has made improvements in almost all of her MMT, but is still deficient in some muscle groups   Time 6   Period Weeks   Status Partially Met     PT LONG TERM  GOAL #2   Title Pt will demo improved safety with stair negotiation, evident by her ability to ascend and descend atleast 4, 6" steps without handrails, using reciprocal pattern 2/3 trials.    Baseline 8/7: uses handrails for steadiness   Time 6   Period Weeks   Status On-going     PT LONG TERM GOAL #3   Title Pt will score greater than 19 points on the DGI, to reflect improvements in her dynamic balance and decrease her risk of falling at home and in the community without an AD.    Baseline 8/7: 19/24 this date   Time 6   Period Weeks   Status Achieved     PT LONG TERM GOAL #4   Title Pt will complete single leg balance on each LE for atleast 10 sec, 2/3 trials, to decrease her risk of falling and causing injury to herself.   Baseline 8/7: LLE: 4, 2, and 5 seconds; RLE: 10, 9, 6 seconds   Time 6   Period Weeks   Status On-going               Plan - 03/22/17 1205    Clinical Impression Statement Introduced Nustep this session, not included in billing. Otherwise spent quite a bit of time working on balance based activities on and off of foam and dynadiscs and with external load, also requiring patient to react to external stimulus such as ball during balance tasks. All tasks modified for R shoulder pain/reduced mobility this session. Patient continues to have difficulty with dynamic tasks on unsteady surfaces such as foam.    Rehab Potential Good   Clinical Impairments Affecting Rehab Potential (+) motivated    PT Frequency 2x / week   PT Duration 6 weeks   PT Treatment/Interventions ADLs/Self Care Home Management;Balance training;Therapeutic exercise;Patient/family education;Functional mobility training;Stair training;Gait training;Neuromuscular re-education;Manual techniques;Passive range of motion   PT Next Visit Plan continue Nustep, continue proximal strengthening progression, continue balance on varied surfaces and dynamic balance as well    PT Home Exercise Plan Supine  bridge x10 reps, supine single leg clamshells with red TB x10 reps each, standing heel/toe raises x20 reps; 8/7: sit <>  stands with GTB, sidestepping with GTB   Consulted and Agree with Plan of Care Patient      Patient will benefit from skilled therapeutic intervention in order to improve the following deficits and impairments:  Abnormal gait, Decreased activity tolerance, Decreased balance, Difficulty walking, Impaired flexibility, Hypomobility, Decreased strength, Decreased range of motion, Decreased mobility, Improper body mechanics  Visit Diagnosis: Unsteadiness on feet  Muscle weakness (generalized)  Other abnormalities of gait and mobility     Problem List Patient Active Problem List   Diagnosis Date Noted  . Aphasia 09/01/2015  . TIA (transient ischemic attack) 06/17/2015  . Osteoporosis 02/18/2015  . Normocytic anemia 12/13/2013  . Diabetes mellitus type II, uncontrolled ([place]) 12/10/2013  . Pneumonia 12/10/2013  . Sepsis ([place]) 12/09/2013  . Type 2 diabetes mellitus with insulin deficiency ([place])   . Hypertension   . Hypertriglyceridemia   . Dyslipidemia     Deniece Ree PT, DPT [phone]  [facility] [address] [place], [place], [address] Phone: [phone]   Fax:  [phone]  Name: Rebecca Perkins MRN: [id number] Date of Birth: 1928-12-11

## 2017-03-23 ENCOUNTER — Encounter: Payer: Self-pay | Admitting: Endocrinology

## 2017-03-23 ENCOUNTER — Ambulatory Visit (INDEPENDENT_AMBULATORY_CARE_PROVIDER_SITE_OTHER): Payer: Medicare Other | Admitting: Endocrinology

## 2017-03-23 VITALS — BP 132/70 | HR 72 | Ht 64.0 in | Wt 141.4 lb

## 2017-03-23 DIAGNOSIS — E782 Mixed hyperlipidemia: Secondary | ICD-10-CM | POA: Diagnosis not present

## 2017-03-23 DIAGNOSIS — E119 Type 2 diabetes mellitus without complications: Secondary | ICD-10-CM | POA: Diagnosis not present

## 2017-03-23 DIAGNOSIS — E041 Nontoxic single thyroid nodule: Secondary | ICD-10-CM

## 2017-03-23 DIAGNOSIS — Z794 Long term (current) use of insulin: Secondary | ICD-10-CM

## 2017-03-23 DIAGNOSIS — E1165 Type 2 diabetes mellitus with hyperglycemia: Secondary | ICD-10-CM

## 2017-03-23 LAB — COMPREHENSIVE METABOLIC PANEL
ALT: 12 U/L (ref 0–35)
AST: 17 U/L (ref 0–37)
Albumin: 4 g/dL (ref 3.5–5.2)
Alkaline Phosphatase: 100 U/L (ref 39–117)
BUN: 19 mg/dL (ref 6–23)
CO2: 29 mEq/L (ref 19–32)
Calcium: 9.9 mg/dL (ref 8.4–10.5)
Chloride: 105 mEq/L (ref 96–112)
Creatinine, Ser: 0.96 mg/dL (ref 0.40–1.20)
GFR: 58.6 mL/min — ABNORMAL LOW (ref >60.00)
Glucose, Bld: 260 mg/dL — ABNORMAL HIGH (ref 70–99)
Potassium: 3.8 mEq/L (ref 3.5–5.1)
Sodium: 141 mEq/L (ref 135–145)
Total Bilirubin: 0.3 mg/dL (ref 0.2–1.2)
Total Protein: 7.2 g/dL (ref 6.0–8.3)

## 2017-03-23 LAB — MICROALBUMIN / CREATININE URINE RATIO
Creatinine,U: 136.4 mg/dL
Microalb Creat Ratio: 2.1 mg/g (ref 0.0–30.0)
Microalb, Ur: 2.8 mg/dL — ABNORMAL HIGH (ref 0.0–1.9)

## 2017-03-23 LAB — POCT GLYCOSYLATED HEMOGLOBIN (HGB A1C): Hemoglobin A1C: 6.6

## 2017-03-23 NOTE — Progress Notes (Signed)
Patient ID: Rebecca Perkins, female   DOB: 05-Feb-1929, 83 y.o.   MRN: [id number]            Reason for Appointment: Follow-up for Type 2 Diabetes  Referring physician: [place]  History of Present Illness:          Diagnosis: Type 2 diabetes mellitus, date of diagnosis: 2006       Past history:  At the time of diagnosis she was feeling tired and weak but does not know what her initial blood sugar was. She probably was tried on metformin initially but because of GI side effects discussed not be continued Also did not tolerate Actos because of nausea. She thinks that she was started on insulin within a few months of her initial diagnosis  Not clear what insulin she was trying in the beginning but she thinks it was NovoLog; however for the last few years has been on Lantus only Her blood sugars have been persistently poorly controlled with A1c usually 8-9% about 2011 At initial consultation she was taking 60 units of Lantus insulin without any mealtime coverage On her visit in 3/17 because of her high A1c she was advised to start taking mealtime insulin at suppertime  In 09/2016 because of A1c being 9.9 she was told to start Victoza in addition to her basal bolus insulin regimen  Recent history:   INSULIN regimen is described as: 42 units Toujeo in a.m., Humalog 0-8 units at supper Non-insulin hypoglycemic drugs the patient is taking are:   Trulicity 7.62 mg weekly    Her last A1c was 7.8 and is now relatively better at 6.6  Current management, blood sugars and problems identified:   She again refuses to take mealtime insulin and evening because of fear of hypoglycemia  She thinks her heaviest meal is in the morning at breakfast but she does not take any Humalog at that time and does not check any readings after eating  She has had readings over 200 periodically after supper but has not done any readings lately  She also has not taken Trulicity for a couple of weeks and she thinks  her blood sugars are looking good, however even her fasting blood sugars are relatively higher in the last week or so  Again occasionally will have some regular soft drinks  Usually does have at least one of a high rate serving at dinnertime  FASTING blood sugars are overall fairly good with some variability       Side effects from medications have been: Metformin, Actos: nausea, diarrhea    Compliance with the medical regimen: Fair  Hypoglycemia: None  but she feels a little hypoglycemic when blood sugars are around 80  Glucose monitoring:  done 1-2 times a day         Glucometer: One Touch.      Blood Glucose readings    Mean values apply above for all meters except median for One Touch  PRE-MEAL Fasting Lunch Dinner Bedtime Overall  Glucose range:  73-1 90  ?  134  104, 230  1 42-277    Mean/median: 112     117+/-45      Self-care: The diet that the patient has been following is: tries to limit portions .     Meals: 2-3 meals per day. Breakfast is variable, bacon, fruit, egg    Dinner 7 pm  She will snack on peanut butter crackers at lunch, sometimes will eat a sandwich at lunch  Exercise: walking occasionally Dietician visit, most recent none               Weight history: Highest weight has been 170  Wt Readings from Last 3 Encounters:  03/23/17 141 lb 6.4 oz (64.1 kg)  02/04/17 137 lb (62.1 kg)  01/21/17 137 lb (62.1 kg)    Glycemic control:   Lab Results  Component Value Date   HGBA1C 6.6 03/23/2017   HGBA1C 7.8 12/07/2016   HGBA1C 9.9 (H) 08/27/2016   Lab Results  Component Value Date   MICROALBUR 1.2 02/10/2016   [place] NOT CALC 08/27/2016   CREATININE 0.87 02/04/2017    Office Visit on 03/23/2017  Component Date Value Ref Range Status  . Hemoglobin A1C 03/23/2017 6.6   Final      Allergies as of 03/23/2017      Reactions   Lipitor [atorvastatin Calcium] Diarrhea   Niacin And Related Hives   Statins Diarrhea   Macrobid  [nitrofurantoin Macrocrystal] Other (See Comments)   unknown      Medication List       Accurate as of 03/23/17  4:51 PM. Always use your most recent med list.          CALTRATE 600+D PO Take 1 tablet by mouth 2 (two) times daily.   clopidogrel 75 MG tablet Commonly known as:  PLAVIX TAKE 1 TABLET BY MOUTH DAILY   co-enzyme Q-10 30 MG capsule Take 100 mg by mouth 2 (two) times daily.   ferrous sulfate 325 (65 FE) MG tablet Take 1 tablet (325 mg total) by mouth 3 (three) times daily with meals.   furosemide 40 MG tablet Commonly known as:  LASIX TAKE 1 TABLET BY MOUTH EVERY DAY.   gemfibrozil 600 MG tablet Commonly known as:  LOPID TAKE 1 TABLET BY MOUTH TWICE DAILY BEFORE A MEAL   insulin lispro 100 UNIT/ML KiwkPen Commonly known as:  HUMALOG KWIKPEN 6-10 units at mealtimes   Insulin Pen Needle 31G X 8 MM Misc Commonly known as:  B-D ULTRAFINE III SHORT PEN USE AS DIRECTED TO INJECT TOUJEO DAILY   INSULIN SYRINGE 1CC/29G 29G X 1/2" 1 ML Misc AS DIRECTED.   losartan 100 MG tablet Commonly known as:  COZAAR TAKE 1 TABLET BY MOUTH EVERY DAY   metoprolol tartrate 50 MG tablet Commonly known as:  LOPRESSOR TAKE 1/2 TABLET  BY MOUTH TWICE DAILY   multivitamin tablet Take 1 tablet by mouth daily.   omega-3 acid ethyl esters 1 g capsule Commonly known as:  LOVAZA Take 3 capsules (3 g total) by mouth daily.   omeprazole 20 MG capsule Commonly known as:  PRILOSEC TAKE ONE CAPSULE BY MOUTH DAILY   ONE TOUCH ULTRA SYSTEM KIT w/Device Kit 1 kit by Does not apply route once. Tests Blood sugar before meals and at bedtime   ONE TOUCH ULTRA TEST test strip Generic drug:  glucose blood USE FOUR TIMES DAILY BEFORE MEALS AND EVERY NIGHT AT BEDTIME   potassium chloride SA 20 MEQ tablet Commonly known as:  K-DUR,KLOR-CON TAKE 1 TABLET BY MOUTH TWICE DAILY   pyridOXINE 100 MG tablet Commonly known as:  VITAMIN B-6 Take 100 mg by mouth daily.   TOUJEO SOLOSTAR 300  UNIT/ML Sopn Generic drug:  Insulin Glargine INJECT 46 UNITS UNDER THE SKIN DAILY   TRULICITY 7.86 VE/7.2CN Sopn Generic drug:  Dulaglutide INJECT INTO THE ABDOMINAL SKIN ONCE WEEKLY            Discharge Care Instructions  Start     Ordered   03/23/17 0000  POCT glycosylated hemoglobin (Hb A1C)     03/23/17 1352   03/23/17 0000  Microalbumin / creatinine urine ratio     03/23/17 1352   04/15/16 0000  Comprehensive metabolic panel     70/01/74 2102      Allergies:  Allergies  Allergen Reactions  . Lipitor [Atorvastatin Calcium] Diarrhea  . Niacin And Related Hives  . Statins Diarrhea  . Macrobid [Nitrofurantoin Macrocrystal] Other (See Comments)    unknown    Past Medical History:  Diagnosis Date  . Anemia   . Diabetes mellitus   . Dyslipidemia   . GERD (gastroesophageal reflux disease)   . Hypertension   . Migraines   . Osteoporosis   . PVD (peripheral vascular disease) ([place])    abi .83 (L), .92 (R)  . RSD (reflex sympathetic dystrophy) 2007   R wrist/hand following fx  . Stroke ([place])   . Varicose veins     Past Surgical History:  Procedure Laterality Date  . ABDOMINAL HYSTERECTOMY  1988  . APPENDECTOMY  1966  . BREAST CYST EXCISION    . CATARACT EXTRACTION  10/2009  . CHOLECYSTECTOMY  1989  . Several benign cyst removed     last 1 in 1972  . TUBAL LIGATION    . UMBILICAL HERNIA REPAIR      Family History  Problem Relation Age of Onset  . Stroke Mother 49  . Hypertension Mother   . Clotting disorder Father   . Heart attack Father   . Arrhythmia Sister   . Stroke Brother   . Diabetes Neg Hx   . Thyroid disease Neg Hx     Social History:  reports that she has never smoked. She has never used smokeless tobacco. She reports that she does not drink alcohol or use drugs.    Review of Systems    She has had a TIA and is taking Plavix  Most recent eye exam was In 03/2016    She is on gemfibrozil and 4 mg Lovaza also but  triglycerides tend to be high, followed by PCP  Needs follow-up      Lab Results  Component Value Date   CHOL 202 (H) 08/27/2016   HDL 32 (L) 08/27/2016   LDLCALC NOT CALC 08/27/2016   LDLDIRECT 41.8 09/13/2011   TRIG 564 (H) 08/27/2016   CHOLHDL 6.3 (H) 08/27/2016                   Thyroid:    On exam has Had a 3 cm nodule on the right side but she has refused to consider biopsy  Lab Results  Component Value Date   TSH 2.00 08/27/2016       The blood pressure has been managed with losartan 50 mg and metoprolol, Followed by PCP Her losartan was reduced to half tablet on the last visit because of low-normal blood pressure Does not have a follow-up scheduled          She has a history of Numbness in her feet and toes From diabetic neuropathy.    Last foot exam was in 9/17 showing sensory loss   Physical Examination:  BP 132/70   Pulse 72   Ht '5\' 4"'$  (1.626 m)   Wt 141 lb 6.4 oz (64.1 kg)   SpO2 97%   BMI 24.27 kg/m    2 cm right-sided firm thyroid nodules felt on swallowing  ASSESSMENT /PLAN:  Diabetes type 2, uncontrolled    See history of present illness for detailed discussion of her current management and problems identified as well as blood sugar patterns  Her A1c is surprisingly better at 6.6, previously persistently high This is likely to be from her using Trulicity which is helping her overall control and possibly also helping her with better portion control  Her postprandial readings have not been checked again very much especially recently and she still has periodic readings over 200 This may be related to her not taking her Humalog with the evening meal She is not taking Trulicity for the last 2 weeks as she does not want to take many medications  HYPERLIPIDEMIA: Needs fasting labs and she will set up appointment with PCP  Recommendations:  Discussed that Trulicity has helped her and she should continue taking this weekly, also reassured her that  that this is safe long-term  She can start reducing her Toujeo by at least 2 units now and another 2 units in 1 week  Explained to her that her overall control is not just assessed by fasting readings that she is doing mostly  Since she does have carbohydrate with evening meal consistently she can take 5 units of Humalog  She will try to take her evening meal earlier than usual  Discussed timing of taking Humalog and duration of action  Also emphasized the need to check some readings after breakfast to help decide on need for Humalog at that time  She will need to follow-up with PCP regarding getting fasting labs for lipids and follow-up of triglycerides  Hypertension: She can continue half tablet losartan since blood pressure is fairly good Recheck renal function today    Patient Instructions  Check blood sugars on waking up  3/7  Also check blood sugars about 2 hours after a meal INCLUDING BREAKFAST and do this after different meals by rotation  Recommended blood sugar levels on waking up is 90-130 and about 2 hours after meal is 130-160  Please bring your blood sugar monitor to each visit, thank you  Toujeo 40 units daily, after 1 week go to 38  Take 5 Humalog at supper DAILY may need in am also  Take Trulicity weekly    Counseling time on subjects discussed in assessment and plan sections is over 50% of today's 25 minute visit   Everitt Wenner 03/23/2017, 4:51 PM       Note: This office note was prepared with Estate agent. Any transcriptional errors that result from this process are unintentional.

## 2017-03-23 NOTE — Patient Instructions (Addendum)
Check blood sugars on waking up  3/7  Also check blood sugars about 2 hours after a meal INCLUDING BREAKFAST and do this after different meals by rotation  Recommended blood sugar levels on waking up is 90-130 and about 2 hours after meal is 130-160  Please bring your blood sugar monitor to each visit, thank you  Toujeo 40 units daily, after 1 week go to 38  Take 5 Humalog at supper DAILY may need in am also  Take Trulicity weekly

## 2017-03-24 ENCOUNTER — Telehealth (HOSPITAL_COMMUNITY): Payer: Self-pay | Admitting: Family Medicine

## 2017-03-24 ENCOUNTER — Ambulatory Visit (HOSPITAL_COMMUNITY): Payer: Medicare Other | Admitting: Physical Therapy

## 2017-03-24 NOTE — Telephone Encounter (Signed)
03/24/17  pt left a message to cx today, she got up with a migraine

## 2017-04-05 ENCOUNTER — Telehealth (HOSPITAL_COMMUNITY): Payer: Self-pay | Admitting: Family Medicine

## 2017-04-05 NOTE — Telephone Encounter (Signed)
04/05/17  pt left a message to cx and she said that she would call back to reschedule

## 2017-04-06 ENCOUNTER — Telehealth (HOSPITAL_COMMUNITY): Payer: Self-pay | Admitting: Family Medicine

## 2017-04-06 ENCOUNTER — Ambulatory Visit (HOSPITAL_COMMUNITY): Payer: Medicare Other

## 2017-04-06 NOTE — Telephone Encounter (Signed)
04/06/17  spoke to patient and she wants to stop therapy.... feels like she is where the dr wanted her to be and hasn't had a dizzy spell since begining therapy

## 2017-04-08 ENCOUNTER — Ambulatory Visit (HOSPITAL_COMMUNITY): Payer: Medicare Other

## 2017-04-10 ENCOUNTER — Other Ambulatory Visit: Payer: Self-pay | Admitting: Family Medicine

## 2017-04-27 ENCOUNTER — Encounter: Payer: Self-pay | Admitting: Family Medicine

## 2017-04-27 ENCOUNTER — Ambulatory Visit (INDEPENDENT_AMBULATORY_CARE_PROVIDER_SITE_OTHER): Payer: Medicare Other | Admitting: Family Medicine

## 2017-04-27 VITALS — BP 130/64 | HR 68 | Temp 97.8°F | Resp 16 | Ht 64.0 in | Wt 140.0 lb

## 2017-04-27 DIAGNOSIS — N390 Urinary tract infection, site not specified: Secondary | ICD-10-CM

## 2017-04-27 LAB — URINALYSIS, ROUTINE W REFLEX MICROSCOPIC
Bilirubin Urine: NEGATIVE
Glucose, UA: NEGATIVE
Ketones, ur: NEGATIVE
Nitrite: NEGATIVE
Specific Gravity, Urine: 1.025 (ref 1.001–1.03)
pH: 5.5 (ref 5.0–8.0)

## 2017-04-27 LAB — MICROSCOPIC MESSAGE

## 2017-04-27 MED ORDER — CIPROFLOXACIN HCL 250 MG PO TABS: 250.0000 mg | ORAL_TABLET | Freq: Two times a day (BID) | ORAL | 0 refills | Status: DC

## 2017-04-27 NOTE — Patient Instructions (Signed)
Take the cipro for 1 week We will call with lab results F/U as needed

## 2017-04-27 NOTE — Progress Notes (Signed)
   Subjective:    Patient ID: Rebecca Perkins, female    DOB: 11-30-1928, 83 y.o.   MRN: [id number]  Patient presents for Dysuria (x1 day- burning with urination)  Dysuria started yesterday, pain with urinating. Has not seen a Urology. She has been treated for urinary tract infection in June with Keflex then in July with Cipro her symptoms did resolve after that treatment. Only culture result noted in the chart is from last year where she had Escherichia coli. Which was pansensitive. He is not having any fever no chills no abdominal pain no nausea vomiting     She was last treated for UTI in July 2018 with Cipro Review Of Systems:  GEN- denies fatigue, fever, weight loss,weakness, recent illness HEENT- denies eye drainage, change in vision, nasal discharge, CVS- denies chest pain, palpitations RESP- denies SOB, cough, wheeze ABD- denies N/V, change in stools, abd pain GU- +dysuria, hematuria, dribbling, incontinence Neuro- denies headache, dizziness, syncope, seizure activity       Objective:    BP 130/64   Pulse 68   Temp 97.8 F (36.6 C) (Oral)   Resp 16   Ht 5\' 4"  (1.626 m)   Wt 140 lb (63.5 kg)   SpO2 97%   BMI 24.03 kg/m  GEN- NAD, alert and oriented x3 HEENT- PERRL, EOMI, non injected sclera, pink conjunctiva, MMM, oropharynx clear CVS- RRR, no murmur RESP-CTAB ABD-NABS,soft,NT,ND, no CVA tenderness  EXT- No edema Pulses- Radial 2+        Assessment & Plan:      Problem List Items Addressed This Visit    None    Visit Diagnoses    Urinary tract infection without hematuria, site unspecified    -  Primary   UTI, will start Cipro twice a day for 7 days due to recurrent symptoms, will get urine culture, no red flags   Relevant Orders   Urinalysis, Routine w reflex microscopic (Completed)   Urine Culture      Note: This dictation was prepared with Dragon dictation along with smaller phrase technology. Any transcriptional errors that result from this  process are unintentional.

## 2017-04-30 LAB — URINE CULTURE
MICRO NUMBER:: 81067352
SPECIMEN QUALITY:: ADEQUATE

## 2017-05-19 ENCOUNTER — Other Ambulatory Visit: Payer: Self-pay | Admitting: Endocrinology

## 2017-05-19 ENCOUNTER — Telehealth: Payer: Self-pay | Admitting: Endocrinology

## 2017-05-19 NOTE — Telephone Encounter (Signed)
Pt called stating that she accidentally used the wrong insulin this morning. She checked her blood sugar and it was 148. She had breakfast (egg, ham and 4 mini muffins) and then used her insulin. She said that she took 40 units of Hemolog and should have used 40 units of Toujao. She said that this has happened before and she was okay but she just wanted to call to make sure.

## 2017-05-19 NOTE — Telephone Encounter (Signed)
She needs to check her sugar now again and at least twice this afternoon to make sure it is not getting low

## 2017-05-19 NOTE — Telephone Encounter (Signed)
Called patient and she stated that she has been getting low and at 10:45am her blood sugar was 64 and she has eaten graham crackers and peanut butter and also has had regular coca cola to drink. I advised her to continue to check her blood sugars and if still having low readings to treat as she has done already. I also asked if someone was with her and she stated her son is there now and she has another son coming this afternoon to stay with her.  She will call if she has any drastic changes.

## 2017-05-19 NOTE — Telephone Encounter (Signed)
Please advise 

## 2017-05-21 ENCOUNTER — Other Ambulatory Visit: Payer: Self-pay | Admitting: Endocrinology

## 2017-05-23 ENCOUNTER — Ambulatory Visit (INDEPENDENT_AMBULATORY_CARE_PROVIDER_SITE_OTHER): Payer: Medicare Other | Admitting: Podiatry

## 2017-05-23 DIAGNOSIS — M79676 Pain in unspecified toe(s): Secondary | ICD-10-CM

## 2017-05-23 DIAGNOSIS — B351 Tinea unguium: Secondary | ICD-10-CM

## 2017-05-25 NOTE — Progress Notes (Signed)
   SUBJECTIVE Patient presents to office today complaining of elongated, thickened nails. Pain while ambulating in shoes. Patient is unable to trim their own nails.   Past Medical History:  Diagnosis Date  . Anemia   . Diabetes mellitus   . Dyslipidemia   . GERD (gastroesophageal reflux disease)   . Hypertension   . Migraines   . Osteoporosis   . PVD (peripheral vascular disease) ([place])    abi .83 (L), .92 (R)  . RSD (reflex sympathetic dystrophy) 2007   R wrist/hand following fx  . Stroke ([place])   . Varicose veins     OBJECTIVE General Patient is awake, alert, and oriented x 3 and in no acute distress. Derm Skin is dry and supple bilateral. Negative open lesions or macerations. Remaining integument unremarkable. Nails are tender, long, thickened and dystrophic with subungual debris, consistent with onychomycosis, 1-5 bilateral. No signs of infection noted. Vasc  DP and PT pedal pulses palpable bilaterally. Temperature gradient within normal limits.  Neuro Epicritic and protective threshold sensation diminished bilaterally.  Musculoskeletal Exam No symptomatic pedal deformities noted bilateral. Muscular strength within normal limits.  ASSESSMENT 1. Onychodystrophic nails 1-5 bilateral with hyperkeratosis of nails.  2. Onychomycosis of nail due to dermatophyte bilateral 3. Pain in foot bilateral  PLAN OF CARE 1. Patient evaluated today.  2. Instructed to maintain good pedal hygiene and foot care.  3. Mechanical debridement of nails 1-5 bilaterally performed using a nail nipper. Filed with dremel without incident.  4. Return to clinic in 3 mos.    Edrick Kins, DPM [facility]  Dr. Edrick Kins, [place]                                        [place], [place] [address]                Office [phone]  Fax [phone]

## 2017-06-08 ENCOUNTER — Other Ambulatory Visit: Payer: Self-pay | Admitting: Family Medicine

## 2017-06-11 ENCOUNTER — Other Ambulatory Visit: Payer: Self-pay | Admitting: Endocrinology

## 2017-06-15 ENCOUNTER — Encounter (HOSPITAL_COMMUNITY): Payer: Self-pay | Admitting: Physical Therapy

## 2017-06-15 NOTE — Therapy (Signed)
[place] [address] [place], [place], [address] Phone: [phone]   Fax:  [phone]  Patient Details  Name: Rebecca Perkins MRN: [id number] Date of Birth: [birth year] Referring Provider:  No ref. provider found  Encounter Date: 06/15/2017   PHYSICAL THERAPY DISCHARGE SUMMARY  Visits from Start of Care: 9  Current functional level related to goals / functional outcomes: Patient self discharged due to satisfaction with current level of function    Remaining deficits: Unable to assess    Education / Equipment: None  Plan: Patient agrees to discharge.  Patient goals were partially met. Patient is being discharged due to being pleased with the current functional level.  ?????       Deniece Ree PT, DPT, CBIS  Supplemental Physical Therapist [place] [address] [place], [place], [address] Phone: [phone]   Fax:  [phone]

## 2017-06-26 NOTE — Progress Notes (Signed)
Patient ID: Rebecca Perkins, female   DOB: Jan 12, 1929, 83 y.o.   MRN: [id number]            Reason for Appointment: Follow-up for Type 2 Diabetes  Referring physician: [place]  History of Present Illness:          Diagnosis: Type 2 diabetes mellitus, date of diagnosis: 2006       Past history:  At the time of diagnosis she was feeling tired and weak but does not know what her initial blood sugar was. She probably was tried on metformin initially but because of GI side effects discussed not be continued Also did not tolerate Actos because of nausea. She thinks that she was started on insulin within a few months of her initial diagnosis  Not clear what insulin she was trying in the beginning but she thinks it was NovoLog; however for the last few years has been on Lantus only Her blood sugars have been persistently poorly controlled with A1c usually 8-9% about 2011 At initial consultation she was taking 60 units of Lantus insulin without any mealtime coverage On her visit in 3/17 because of her high A1c she was advised to start taking mealtime insulin at suppertime  In 09/2016 because of A1c being 9.9 she was told to start Victoza in addition to her basal bolus insulin regimen  Recent history:   INSULIN regimen is described as: 40 units Toujeo in a.m., Humalog: None  Non-insulin hypoglycemic drugs the patient is taking are:   not on Trulicity 2.24 mg:     Her last A1c improved at 6.6 and now back up to 7.9  Current management, blood sugars and problems identified:   She again is noncompliant with her instructions for taking her diabetes medications  She was told to start back on Trulicity on the last visit but she does not dose so and does not explain why  She will also not take Humalog on that she has had blood sugars as high as 368  She does not understand that her basal insulin is not controlling her sugars after meals  Again checking blood sugars mostly fasting with only a  couple of readings at night and none after breakfast or lunch  She says her diet has been variable because of various reasons  Again she thinks that her meals in the mornings are variable but sometimes has a large meal but no readings after eating  She may also occasionally have a regular soft drinks  Last night she had a small balanced meal without much carbohydrate but her fasting reading was 195  She does not think she forgets to take her Toujeo in the morning       Side effects from medications have been: Metformin, Actos: nausea, diarrhea    Compliance with the medical regimen: Fair  Hypoglycemia: None  but she feels a little hypoglycemic when blood sugars are around 80  Glucose monitoring:  done 1-2 times a day         Glucometer: One Touch.      Blood Glucose readings    Mean values apply above for all meters except median for One Touch  PRE-MEAL Fasting Lunch Dinner Bedtime Overall  Glucose range:  69-312    150-361    Mean/median: 140     142      Self-care: The diet that the patient has been following is: tries to limit portions .     Meals: 2-3 meals per day. Breakfast  is variable, bacon, fruit, egg    Dinner 7 pm  She will snack on peanut butter crackers at lunch, sometimes will eat a sandwich at lunch            Exercise: none  Dietician visit, most recent none               Weight history: Highest weight has been 170  Wt Readings from Last 3 Encounters:  06/27/17 142 lb 6.4 oz (64.6 kg)  04/27/17 140 lb (63.5 kg)  03/23/17 141 lb 6.4 oz (64.1 kg)    Glycemic control:   Lab Results  Component Value Date   HGBA1C 7.9 06/27/2017   HGBA1C 6.6 03/23/2017   HGBA1C 7.8 12/07/2016   Lab Results  Component Value Date   MICROALBUR 2.8 (H) 03/23/2017   [place] NOT CALC 08/27/2016   CREATININE 0.89 06/27/2017    Office Visit on 06/27/2017  Component Date Value Ref Range Status  . Hemoglobin A1C 06/27/2017 7.9   Final  . Free T4 06/27/2017 0.66  0.60 -  1.60 ng/dL Final   Comment: Specimens from patients who are undergoing biotin therapy and /or ingesting biotin supplements may contain high levels of biotin.  The higher biotin concentration in these specimens interferes with this Free T4 assay.  Specimens that contain high levels  of biotin may cause false high results for this Free T4 assay.  Please interpret results in light of the total clinical presentation of the patient.    [place] Kitchen TSH 06/27/2017 1.57  0.35 - 4.50 uIU/mL Final  . Sodium 06/27/2017 141  135 - 145 mEq/L Final  . Potassium 06/27/2017 3.7  3.5 - 5.1 mEq/L Final  . Chloride 06/27/2017 105  96 - 112 mEq/L Final  . CO2 06/27/2017 28  19 - 32 mEq/L Final  . Glucose, Bld 06/27/2017 245* 70 - 99 mg/dL Final  . BUN 06/27/2017 18  6 - 23 mg/dL Final  . Creatinine, Ser 06/27/2017 0.89  0.40 - 1.20 mg/dL Final  . Calcium 06/27/2017 9.8  8.4 - 10.5 mg/dL Final  . GFR 06/27/2017 63.91  >60.00 mL/min Final      Allergies as of 06/27/2017      Reactions   Lipitor [atorvastatin Calcium] Diarrhea   Niacin And Related Hives   Statins Diarrhea   Macrobid [nitrofurantoin Macrocrystal] Other (See Comments)   unknown      Medication List        Accurate as of 06/27/17 11:59 PM. Always use your most recent med list.          clopidogrel 75 MG tablet Commonly known as:  PLAVIX TAKE 1 TABLET BY MOUTH DAILY   co-enzyme Q-10 30 MG capsule Take 100 mg by mouth 2 (two) times daily.   ferrous sulfate 325 (65 FE) MG tablet Take 1 tablet (325 mg total) by mouth 3 (three) times daily with meals.   gemfibrozil 600 MG tablet Commonly known as:  LOPID TAKE 1 TABLET BY MOUTH TWICE DAILY BEFORE A MEAL   insulin lispro 100 UNIT/ML KiwkPen Commonly known as:  HUMALOG KWIKPEN 6-10 units at mealtimes   Insulin Pen Needle 31G X 8 MM Misc Commonly known as:  B-D ULTRAFINE III SHORT PEN USE AS DIRECTED TO INJECT TOUJEO DAILY   INSULIN SYRINGE 1CC/29G 29G X 1/2" 1 ML Misc AS DIRECTED.     losartan 100 MG tablet Commonly known as:  COZAAR TAKE 1 TABLET BY MOUTH EVERY DAY   metoprolol tartrate 50  MG tablet Commonly known as:  LOPRESSOR TAKE 1/2 TABLET  BY MOUTH TWICE DAILY   multivitamin tablet Take 1 tablet by mouth daily.   omega-3 acid ethyl esters 1 g capsule Commonly known as:  LOVAZA TAKE 3 CAPSULES BY MOUTH DAILY   omeprazole 20 MG capsule Commonly known as:  PRILOSEC TAKE ONE CAPSULE BY MOUTH DAILY   ONE TOUCH ULTRA SYSTEM KIT w/Device Kit 1 kit by Does not apply route once. Tests Blood sugar before meals and at bedtime   ONE TOUCH ULTRA TEST test strip Generic drug:  glucose blood USE FOUR TIMES DAILY BEFORE MEALS AND EVERY NIGHT AT BEDTIME   pyridOXINE 100 MG tablet Commonly known as:  VITAMIN B-6 Take 100 mg by mouth daily.   TOUJEO SOLOSTAR 300 UNIT/ML Sopn Generic drug:  Insulin Glargine INJECT 46 UNITS UNDER THE SKIN DAILY   TRULICITY 4.49 QP/5.9FM Sopn Generic drug:  Dulaglutide INJECT INTO THE ABDOMINAL SKIN ONCE WEEKLY       Allergies:  Allergies  Allergen Reactions  . Lipitor [Atorvastatin Calcium] Diarrhea  . Niacin And Related Hives  . Statins Diarrhea  . Macrobid [Nitrofurantoin Macrocrystal] Other (See Comments)    unknown    Past Medical History:  Diagnosis Date  . Anemia   . Diabetes mellitus   . Dyslipidemia   . GERD (gastroesophageal reflux disease)   . Hypertension   . Migraines   . Osteoporosis   . PVD (peripheral vascular disease) ([place])    abi .83 (L), .92 (R)  . RSD (reflex sympathetic dystrophy) 2007   R wrist/hand following fx  . Stroke ([place])   . Varicose veins     Past Surgical History:  Procedure Laterality Date  . ABDOMINAL HYSTERECTOMY  1988  . APPENDECTOMY  1966  . BREAST CYST EXCISION    . CATARACT EXTRACTION  10/2009  . CHOLECYSTECTOMY  1989  . Several benign cyst removed     last 1 in 1972  . TUBAL LIGATION    . UMBILICAL HERNIA REPAIR      Family History  Problem Relation Age of  Onset  . Stroke Mother 79  . Hypertension Mother   . Clotting disorder Father   . Heart attack Father   . Arrhythmia Sister   . Stroke Brother   . Diabetes Neg Hx   . Thyroid disease Neg Hx     Social History:  reports that  has never smoked. she has never used smokeless tobacco. She reports that she does not drink alcohol or use drugs.    Review of Systems    She has had a TIA and is taking Plavix  Most recent eye exam was In 03/2016    She is on gemfibrozil and 4 mg Lovaza also but triglycerides tend to be high, followed by PCP  Needs follow-up      Lab Results  Component Value Date   CHOL 202 (H) 08/27/2016   HDL 32 (L) 08/27/2016   LDLCALC NOT CALC 08/27/2016   LDLDIRECT 41.8 09/13/2011   TRIG 564 (H) 08/27/2016   CHOLHDL 6.3 (H) 08/27/2016                   Thyroid:    On exam has Had a 3 cm nodule on the right side but she has refused to consider biopsy  Lab Results  Component Value Date   TSH 1.57 06/27/2017       The blood pressure has been managed with losartan 50  mg and metoprolol, Followed by PCP Her losartan was reduced to half tablet on the last visit because of low-normal blood pressure Does not have a follow-up scheduled          She has a history of Numbness in her feet and toes From diabetic neuropathy.    Last foot exam was in 06/2017 showing sensory loss   Physical Examination:  BP 134/68   Pulse 72   Ht '5\' 4"'$  (1.626 m)   Wt 142 lb 6.4 oz (64.6 kg)   SpO2 97%   BMI 24.44 kg/m    2.5 cm right-sided firm thyroid nodule felt  Diabetic Foot Exam - Simple   Simple Foot Form Diabetic Foot exam was performed with the following findings:  Yes   Visual Inspection No deformities, no ulcerations, no other skin breakdown bilaterally:  Yes Sensation Testing See comments:  Yes Pulse Check See comments:  Yes Comments Absent monofilament sensation on the toes and distal plantar surfaces except left first and second toe Pedal pulses on the  right difficult to palpate        ASSESSMENT /PLAN:  Diabetes type 2, uncontrolled    See history of present illness for detailed discussion of her current management and problems identified as well as blood sugar patterns  Her A1c is higher at 7.8  She is not taking Trulicity even though she was supposed to start on her last visit She is still reluctant to take all her prescribed medications  HYPERLIPIDEMIA: Needs fasting labs and she will set up appointment with PCP, she thinks she is going to have a physical in January  Recommendations:  Discussed that Trulicity has helped her previously and brought her A1c down more than 1% because of controlling postprandial hyperglycemia and she needs to go back on this  She should be able to try and do this every Sunday for simplicity  She will also put a note on her refrigerator to remind her to do this  She does need to check readings after meals and less in the morning  Have shown her actions of basal and bolus insulins and also described how post prandial spikes occur on a brochure  Discussed that she may need Humalog if her blood sugars are consistently high at 2 hours after one of her meals  Encouraged her to be more active with some walking   She will need to follow-up with PCP regarding getting fasting labs for lipids and follow-up of triglycerides  Hypertension: She can continue half tablet losartan since blood pressure is fairly good Recheck renal function today    Patient Instructions   Check blood sugars on waking up 3/7  Also check blood sugars about 2 hours after a meal and do this after different meals by rotation  Recommended blood sugar levels on waking up is 90-130 and about 2 hours after meal is 130-160  Please bring your blood sugar monitor to each visit, thank you   Put a note up to take TRULICITY  See Dr Dennard Schaumann  Need eye exam reports  Counseling time on subjects discussed in assessment and plan  sections is over 50% of today's 25 minute visit   Italia Wolfert 06/28/2017, 7:56 AM       Note: This office note was prepared with Estate agent. Any transcriptional errors that result from this process are unintentional.

## 2017-06-27 ENCOUNTER — Ambulatory Visit (INDEPENDENT_AMBULATORY_CARE_PROVIDER_SITE_OTHER): Payer: Medicare Other | Admitting: Endocrinology

## 2017-06-27 ENCOUNTER — Other Ambulatory Visit: Payer: Self-pay | Admitting: Endocrinology

## 2017-06-27 ENCOUNTER — Encounter: Payer: Self-pay | Admitting: Endocrinology

## 2017-06-27 VITALS — BP 134/68 | HR 72 | Ht 64.0 in | Wt 142.4 lb

## 2017-06-27 DIAGNOSIS — E041 Nontoxic single thyroid nodule: Secondary | ICD-10-CM | POA: Diagnosis not present

## 2017-06-27 DIAGNOSIS — Z794 Long term (current) use of insulin: Secondary | ICD-10-CM

## 2017-06-27 DIAGNOSIS — E1165 Type 2 diabetes mellitus with hyperglycemia: Secondary | ICD-10-CM

## 2017-06-27 DIAGNOSIS — I10 Essential (primary) hypertension: Secondary | ICD-10-CM | POA: Diagnosis not present

## 2017-06-27 LAB — POCT GLYCOSYLATED HEMOGLOBIN (HGB A1C): Hemoglobin A1C: 7.9

## 2017-06-27 LAB — BASIC METABOLIC PANEL
BUN: 18 mg/dL (ref 6–23)
CO2: 28 mEq/L (ref 19–32)
Calcium: 9.8 mg/dL (ref 8.4–10.5)
Chloride: 105 mEq/L (ref 96–112)
Creatinine, Ser: 0.89 mg/dL (ref 0.40–1.20)
GFR: 63.91 mL/min (ref >60.00)
Glucose, Bld: 245 mg/dL — ABNORMAL HIGH (ref 70–99)
Potassium: 3.7 mEq/L (ref 3.5–5.1)
Sodium: 141 mEq/L (ref 135–145)

## 2017-06-27 LAB — T4, FREE: Free T4: 0.66 ng/dL (ref 0.60–1.60)

## 2017-06-27 LAB — TSH: TSH: 1.57 u[IU]/mL (ref 0.35–4.50)

## 2017-06-27 NOTE — Patient Instructions (Addendum)
  Check blood sugars on waking up 3/7  Also check blood sugars about 2 hours after a meal and do this after different meals by rotation  Recommended blood sugar levels on waking up is 90-130 and about 2 hours after meal is 130-160  Please bring your blood sugar monitor to each visit, thank you   Put a note up to take TRULICITY  See Dr Dennard Schaumann  Need eye exam reports

## 2017-06-28 NOTE — Progress Notes (Signed)
Please call to let patient know that the blood sugar was 245, she needs to take 4 units of Humalog at breakfast if blood sugar is over 150 in the morning and also takes 6 units at dinnertime unless eating a light meal.  Also reminded her to start taking Trulicity weekly

## 2017-07-14 ENCOUNTER — Other Ambulatory Visit: Payer: Self-pay | Admitting: Family Medicine

## 2017-07-29 LAB — HM DIABETES EYE EXAM

## 2017-08-17 ENCOUNTER — Encounter: Payer: Self-pay | Admitting: Endocrinology

## 2017-08-24 ENCOUNTER — Encounter: Payer: Self-pay | Admitting: Podiatry

## 2017-08-24 ENCOUNTER — Ambulatory Visit: Payer: Medicare Other | Admitting: Podiatry

## 2017-08-24 DIAGNOSIS — E08621 Diabetes mellitus due to underlying condition with foot ulcer: Secondary | ICD-10-CM | POA: Diagnosis not present

## 2017-08-24 DIAGNOSIS — L97501 Non-pressure chronic ulcer of other part of unspecified foot limited to breakdown of skin: Secondary | ICD-10-CM

## 2017-08-24 DIAGNOSIS — M79676 Pain in unspecified toe(s): Secondary | ICD-10-CM | POA: Diagnosis not present

## 2017-08-24 DIAGNOSIS — B351 Tinea unguium: Secondary | ICD-10-CM | POA: Diagnosis not present

## 2017-08-24 NOTE — Progress Notes (Signed)
Subjective:   Patient ID: Rebecca Perkins, female   DOB: 83 y.o.   MRN: [id number]   HPI Patient presents with elongated nailbeds 1-5 both feet that are yellow thick and incurvated and damage to the third nail left with what appears to be trauma   ROS      Objective:  Physical Exam  Patient overall is doing well with a long-term history of diabetes with breakdown of tissue around the left third nailbed secondary to trauma and pressure of the second toe with elongated nailbeds 1-5 both feet of both feet     Assessment:  At risk diabetic with mycotic nail infection 1 5 both feet with localized infection left third nailbed with no proximal edema erythema or drainage noted F2 H&P condition cleaned on the third toe flushed out and advised on soaks and debrided nailbeds 1-5 both feet with no iatrogenic bleeding     Plan:  Above discussed concerning condition

## 2017-09-01 LAB — HM DIABETES EYE EXAM

## 2017-09-02 ENCOUNTER — Ambulatory Visit (INDEPENDENT_AMBULATORY_CARE_PROVIDER_SITE_OTHER): Payer: Medicare Other | Admitting: Family Medicine

## 2017-09-02 ENCOUNTER — Encounter: Payer: Self-pay | Admitting: Family Medicine

## 2017-09-02 VITALS — BP 156/66 | HR 68 | Temp 97.5°F | Resp 12 | Ht 64.0 in | Wt 138.0 lb

## 2017-09-02 DIAGNOSIS — G459 Transient cerebral ischemic attack, unspecified: Secondary | ICD-10-CM

## 2017-09-02 DIAGNOSIS — E119 Type 2 diabetes mellitus without complications: Secondary | ICD-10-CM

## 2017-09-02 DIAGNOSIS — E781 Pure hyperglyceridemia: Secondary | ICD-10-CM

## 2017-09-02 DIAGNOSIS — H6191 Disorder of right external ear, unspecified: Secondary | ICD-10-CM

## 2017-09-02 DIAGNOSIS — M810 Age-related osteoporosis without current pathological fracture: Secondary | ICD-10-CM | POA: Diagnosis not present

## 2017-09-02 DIAGNOSIS — L989 Disorder of the skin and subcutaneous tissue, unspecified: Secondary | ICD-10-CM | POA: Diagnosis not present

## 2017-09-02 DIAGNOSIS — Z0000 Encounter for general adult medical examination without abnormal findings: Secondary | ICD-10-CM

## 2017-09-02 DIAGNOSIS — Z8673 Personal history of transient ischemic attack (TIA), and cerebral infarction without residual deficits: Secondary | ICD-10-CM

## 2017-09-02 DIAGNOSIS — E785 Hyperlipidemia, unspecified: Secondary | ICD-10-CM

## 2017-09-02 DIAGNOSIS — N289 Disorder of kidney and ureter, unspecified: Secondary | ICD-10-CM

## 2017-09-02 MED ORDER — ATORVASTATIN CALCIUM 40 MG PO TABS: 40.0000 mg | ORAL_TABLET | Freq: Every day | ORAL | 3 refills | Status: DC

## 2017-09-02 NOTE — Progress Notes (Signed)
Subjective:    Patient ID: Rebecca Perkins, female    DOB: 1929-03-17, 84 y.o.   MRN: [id number]  HPI  01/21/17 The patient last had lab work in our office in January and at that time her creatinine was 0.89. Her calculated glomerular filtration rate was approximate 60 mL of blood per minute. Last week, her endocrinologist recheck her creatinine and found to be 1.55 with a GFR of 30 mL of blood per minute. Repeat BMP revealed a creatinine of 1.37 with a GFR of 41 mL of blood per minute. She is here today to discuss. Patient is an 84 year old Caucasian female with a long-standing history of hypertriglyceridemia and insulin-dependent diabetes mellitus that has been poorly controlled. She is not taking any type of NSAID. However she is not drinking enough fluid. Recently she was found to be orthostatic and hypotensive causing her dizziness upon standing. She is also taking Lasix due to swelling in her legs. She denies any symptoms of a bladder infection.  At that time, my plan was:  Multi-factorial. Suspect related to age, chronic medical comorbidities, uncontrolled diabetes mellitus, dehydration, and diuretic use. I've asked the patient to discontinue her use of Lasix.  Recheck BMP in 1 week.  Obtain renal US and UA.   02/04/17 Urinalysis obtained at that office visit suggested a serious infection and the patient was treated with antibiotics. She states that by the time she finished antibiotics she felt much better. The dizziness upon standing had essentially resolved. She was no longer orthostatic. She's been trying to drink more fluid. Renal ultrasound was unremarkable. There is no hydronephrosis. There is no mass. She is here today to recheck her urine sample and also to recheck her renal function.  At that time, my plan was:  Symptomatically, the patient feels much better. Orthostatic dizziness has resolved. She appears more hydrated today. I will repeat a urinalysis to ensure resolution of her  urinary tract infection. I will also repeat her renal function as I anticipated it would be better and back to her baseline. Renal ultrasound was reassuring. If worsening, consider an SPEP or UPEP  09/02/17 Patient is here today for a complete physical exam.  She is seeing endocrinology for management of her type 2 diabetes mellitus.  Her last hemoglobin A1c was 7.9 in November.  She is here today for fasting lab work.  Her immunization records are below: Immunization History  Administered Date(s) Administered  . Influenza Split 05/05/2011  . Influenza, High Dose Seasonal PF 05/03/2015, 04/30/2016  . Influenza,inj,Quad PF,6+ Mos 04/25/2014  . Influenza-Unspecified 05/06/2012, 04/02/2013, 04/23/2016  . Pneumococcal Conjugate-13 08/13/2013  . Pneumococcal Polysaccharide-23 08/30/2016  . Tdap 12/15/2016  . Zoster Recombinat (Shingrix) 10/16/2016, 12/15/2016   Due to age, I have recommended against cancer screening including mammograms, Pap smear, or colonoscopy.  Patient's last bone density test was in 2016.  At that time her T score in her left hip was -2.6 qualifying the patient for osteoporosis.  She is on no treatment for osteoporosis.  She is not taking calcium or vitamin D.  Also there is a lesion on her lower left earlobe.  It is approximately 2 cm x 1 cm.  It is erythematous, bleeding, with scale.  It is concerning for a possible skin malignancy.  She is apparently seeing cardiology for this in the past.  I have recommended repeat follow-up for possible biopsy as I am concerned that this may represent a skin cancer.  She is accompanied today by her  son.  Her son reports possible TIA-like symptoms.  He states that the patient recently had an episode of word finding aphasia accompanied with slurred speech.  Symptoms resolved after 10-15 minutes.  Patient did not check her sugar during this event and therefore we are uncertain if this may have been hypoglycemia.  However she has previously had TIAs in  the past and is currently on Plavix for secondary prevention of stroke.  She is not on a statin due to her inability to tolerate statins because of diarrhea.  She is not taking aspirin.  Her last carotid Dopplers were in 2016 and at that time revealed 1-39% carotid artery stenosis.  Echocardiogram at that time revealed no cardioembolic source of stroke.  On exam today, her neurologic exam is completely normal Past Medical History:  Diagnosis Date  . Anemia   . Diabetes mellitus   . Dyslipidemia   . GERD (gastroesophageal reflux disease)   . Hypertension   . Migraines   . Osteoporosis   . PVD (peripheral vascular disease) ([place])    abi .83 (L), .92 (R)  . RSD (reflex sympathetic dystrophy) 2007   R wrist/hand following fx  . Stroke ([place])   . Varicose veins    Past Surgical History:  Procedure Laterality Date  . ABDOMINAL HYSTERECTOMY  1988  . APPENDECTOMY  1966  . BREAST CYST EXCISION    . CATARACT EXTRACTION  10/2009  . CHOLECYSTECTOMY  1989  . Several benign cyst removed     last 1 in 1972  . TUBAL LIGATION    . UMBILICAL HERNIA REPAIR     Current Outpatient Medications on File Prior to Visit  Medication Sig Dispense Refill  . Blood Glucose Monitoring Suppl (ONE TOUCH ULTRA SYSTEM KIT) W/DEVICE KIT 1 kit by Does not apply route once. Tests Blood sugar before meals and at bedtime 1 each 0  . clopidogrel (PLAVIX) 75 MG tablet TAKE 1 TABLET BY MOUTH DAILY 30 tablet 11  . co-enzyme Q-10 30 MG capsule Take 100 mg by mouth 2 (two) times daily.    Marland Kitchen gemfibrozil (LOPID) 600 MG tablet TAKE 1 TABLET BY MOUTH TWICE DAILY BEFORE A MEAL 60 tablet 11  . insulin lispro (HUMALOG KWIKPEN) 100 UNIT/ML KiwkPen 6-10 units at mealtimes (Patient taking differently: Use only if BS over 150 in am) 15 mL 1  . Insulin Pen Needle (B-D ULTRAFINE III SHORT PEN) 31G X 8 MM MISC USE AS DIRECTED TO INJECT TOUJEO DAILY 100 each 5  . INSULIN SYRINGE 1CC/29G 29G X 1/2" 1 ML MISC AS DIRECTED. 100 each 3  . losartan  (COZAAR) 100 MG tablet TAKE 1 TABLET BY MOUTH EVERY DAY (Patient taking differently: TAKE 1/2 TABLET BY MOUTH EVERY DAY) 30 tablet 11  . metoprolol (LOPRESSOR) 50 MG tablet TAKE 1/2 TABLET  BY MOUTH TWICE DAILY 180 tablet 0  . Multiple Vitamin (MULTIVITAMIN) tablet Take 1 tablet by mouth daily.      Marland Kitchen omega-3 acid ethyl esters (LOVAZA) 1 g capsule TAKE 3 CAPSULES BY MOUTH DAILY 60 capsule 0  . omeprazole (PRILOSEC) 20 MG capsule TAKE ONE CAPSULE BY MOUTH DAILY 90 capsule 3  . ONE TOUCH ULTRA TEST test strip USE FOUR TIMES DAILY BEFORE MEALS AND EVERY NIGHT AT BEDTIME 150 each 5  . Pyridoxine HCl (VITAMIN B-6) 100 MG tablet Take 100 mg by mouth daily.      Nelva Nay SOLOSTAR 300 UNIT/ML SOPN INJECT 46 UNITS UNDER THE SKIN DAILY 14 pen 1  .  TRULICITY 4.09 WJ/1.9JY SOPN INJECT INTO THE ABDOMINAL SKIN ONCE WEEKLY 2 mL 3  . ferrous sulfate 325 (65 FE) MG tablet Take 1 tablet (325 mg total) by mouth 3 (three) times daily with meals. (Patient not taking: Reported on 06/27/2017) 90 tablet 4   No current facility-administered medications on file prior to visit.    Allergies  Allergen Reactions  . Lipitor [Atorvastatin Calcium] Diarrhea  . Niacin And Related Hives  . Statins Diarrhea  . Macrobid [Nitrofurantoin Macrocrystal] Other (See Comments)    unknown   Social History   Socioeconomic History  . Marital status: Widowed    Spouse name: Not on file  . Number of children: 3  . Years of education: Not on file  . Highest education level: Not on file  Social Needs  . Financial resource strain: Not on file  . Food insecurity - worry: Not on file  . Food insecurity - inability: Not on file  . Transportation needs - medical: Not on file  . Transportation needs - non-medical: Not on file  Occupational History  . Occupation: Retired  Tobacco Use  . Smoking status: Never Smoker  . Smokeless tobacco: Never Used  Substance and Sexual Activity  . Alcohol use: No  . Drug use: No  . Sexual  activity: Not on file  Other Topics Concern  . 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      Objective:   Physical Exam  Constitutional: She is oriented to person, place, and time. She appears well-developed and well-nourished. No distress.  HENT:  Head: Normocephalic and atraumatic.  Right Ear: External ear normal.  Left Ear: External ear normal.  Nose: Nose normal.  Mouth/Throat: Oropharynx is clear and moist. No oropharyngeal exudate.  Eyes: Conjunctivae and EOM are normal. Pupils are equal, round, and reactive to light. Right eye exhibits no discharge. Left eye exhibits no discharge. No scleral icterus.  Neck: Normal range of motion. Neck supple. No JVD present. No tracheal deviation present. No thyromegaly present.  Cardiovascular: Normal rate, regular rhythm, normal heart sounds and intact distal pulses. Exam reveals no gallop and no friction rub.  No murmur heard. Pulmonary/Chest: Effort normal and breath sounds normal. No stridor. No respiratory distress. She has no wheezes. She has no rales. She exhibits no tenderness.  Abdominal: Soft. Bowel sounds are normal. She exhibits no distension and no mass. There is no tenderness. There is no rebound and no guarding.  Musculoskeletal: She exhibits no edema, tenderness or deformity.  Lymphadenopathy:    She has no cervical adenopathy.  Neurological: She is alert and oriented to person, place, and time. She displays normal reflexes. No cranial nerve deficit. She exhibits normal muscle tone. Coordination normal.  Skin: Skin is warm. No rash noted. She is not diaphoretic. No erythema. No pallor.  Psychiatric: She has a normal mood and affect. Her behavior is normal. Judgment and thought content normal.  Vitals reviewed.           Assessment & Plan:  Routine general medical examination at a health care facility  Hypertriglyceridemia  Osteoporosis, unspecified osteoporosis type, unspecified pathological fracture presence  History of CVA (cerebrovascular accident)  Dyslipidemia  Type 2 diabetes mellitus with insulin deficiency ([place])  Renal insufficiency  Patient's physical  exam is concerning first for the atypical lesion on her right ear.  I will schedule her a follow-up with her dermatologist as I am concerned that this may be an underlying skin cancer.  I am also concerned by her untreated osteoporosis.  I recommended 1000 mg a day of calcium and 1000 units a day of vitamin D.  I will also schedule the patient for prolia.  However my most concerning finding is her history of possible TIA like symptoms.  I am unable to determine if episode was due to hypoglycemia or if she was experiencing a TIA.  However I will go ahead and err on the side of caution.  I recommended adding aspirin 81 mg a day to the Plavix 75 mg she is taking.  I will obtain carotid Dopplers to rule out critical carotid artery stenosis.  I will obtain echocardiogram to rule out cardioembolic sources of stroke.  I recommended adding statin Lipitor 40 mg a day for secondary prevention of stroke despite her history of GI intolerance.  I am not comfortable with Lipitor coupled with gemfibrozil.  Therefore depending upon the results of her lipid panel, we may discontinue the gemfibrozil and switch the patient to Vascepa which has better outcomes data in this particular situation.  I also strongly recommended to the patient if symptoms happen again she call 911 immediately as time is of the essence and then also check her sugars to see if this could be hypoglycemia.

## 2017-09-02 NOTE — Addendum Note (Signed)
Addended by: Shary Decamp B on: 09/02/2017 04:09 PM   Modules accepted: Orders

## 2017-09-03 LAB — CBC WITH DIFFERENTIAL/PLATELET
Basophils Absolute: 48 {cells}/uL (ref 0–200)
Basophils Relative: 0.8 %
Eosinophils Absolute: 144 {cells}/uL (ref 15–500)
Eosinophils Relative: 2.4 %
HCT: 36.8 % (ref 35.0–45.0)
Hemoglobin: 12.2 g/dL (ref 11.7–15.5)
Lymphs Abs: 2238 {cells}/uL (ref 850–3900)
MCH: 26.5 pg — ABNORMAL LOW (ref 27.0–33.0)
MCHC: 33.2 g/dL (ref 32.0–36.0)
MCV: 79.8 fL — ABNORMAL LOW (ref 80.0–100.0)
MPV: 10.3 fL (ref 7.5–12.5)
Monocytes Relative: 9.6 %
Neutro Abs: 2994 {cells}/uL (ref 1500–7800)
Neutrophils Relative %: 49.9 %
Platelets: 223 10*3/uL (ref 140–400)
RBC: 4.61 Million/uL (ref 3.80–5.10)
RDW: 13 % (ref 11.0–15.0)
Total Lymphocyte: 37.3 %
WBC mixed population: 576 {cells}/uL (ref 200–950)
WBC: 6 10*3/uL (ref 3.8–10.8)

## 2017-09-03 LAB — COMPLETE METABOLIC PANEL WITHOUT GFR
AG Ratio: 1.4 (calc) (ref 1.0–2.5)
ALT: 8 U/L (ref 6–29)
AST: 13 U/L (ref 10–35)
Albumin: 4.1 g/dL (ref 3.6–5.1)
Alkaline phosphatase (APISO): 92 U/L (ref 33–130)
BUN/Creatinine Ratio: 16 (calc) (ref 6–22)
BUN: 15 mg/dL (ref 7–25)
CO2: 26 mmol/L (ref 20–32)
Calcium: 9.8 mg/dL (ref 8.6–10.4)
Chloride: 108 mmol/L (ref 98–110)
Creat: 0.93 mg/dL — ABNORMAL HIGH (ref 0.60–0.88)
GFR, Est African American: 64 mL/min/1.73m2
GFR, Est Non African American: 56 mL/min/1.73m2 — ABNORMAL LOW
Globulin: 3 g/dL (ref 1.9–3.7)
Glucose, Bld: 154 mg/dL — ABNORMAL HIGH (ref 65–99)
Potassium: 4.5 mmol/L (ref 3.5–5.3)
Sodium: 144 mmol/L (ref 135–146)
Total Bilirubin: 0.3 mg/dL (ref 0.2–1.2)
Total Protein: 7.1 g/dL (ref 6.1–8.1)

## 2017-09-03 LAB — LIPID PANEL
Cholesterol: 213 mg/dL — ABNORMAL HIGH
HDL: 29 mg/dL — ABNORMAL LOW
LDL Cholesterol (Calc): 149 mg/dL — ABNORMAL HIGH
Non-HDL Cholesterol (Calc): 184 mg/dL — ABNORMAL HIGH
Total CHOL/HDL Ratio: 7.3 (calc) — ABNORMAL HIGH
Triglycerides: 213 mg/dL — ABNORMAL HIGH

## 2017-09-03 LAB — MICROALBUMIN, URINE: Microalb, Ur: 7.6 mg/dL

## 2017-09-04 ENCOUNTER — Observation Stay (HOSPITAL_COMMUNITY): Payer: Medicare Other

## 2017-09-04 ENCOUNTER — Observation Stay (HOSPITAL_COMMUNITY)
Admission: EM | Admit: 2017-09-04 | Discharge: 2017-09-05 | Disposition: A | Discharge status: Home / Self Care | Payer: Medicare Other | Attending: Internal Medicine | Admitting: Internal Medicine

## 2017-09-04 ENCOUNTER — Encounter (HOSPITAL_COMMUNITY): Payer: Self-pay | Admitting: Emergency Medicine

## 2017-09-04 ENCOUNTER — Emergency Department (HOSPITAL_COMMUNITY): Payer: Medicare Other

## 2017-09-04 ENCOUNTER — Other Ambulatory Visit: Payer: Self-pay

## 2017-09-04 DIAGNOSIS — G459 Transient cerebral ischemic attack, unspecified: Secondary | ICD-10-CM | POA: Diagnosis not present

## 2017-09-04 DIAGNOSIS — K219 Gastro-esophageal reflux disease without esophagitis: Secondary | ICD-10-CM | POA: Diagnosis present

## 2017-09-04 DIAGNOSIS — R4701 Aphasia: Secondary | ICD-10-CM | POA: Insufficient documentation

## 2017-09-04 DIAGNOSIS — Z79899 Other long term (current) drug therapy: Secondary | ICD-10-CM | POA: Diagnosis not present

## 2017-09-04 DIAGNOSIS — Z794 Long term (current) use of insulin: Secondary | ICD-10-CM | POA: Insufficient documentation

## 2017-09-04 DIAGNOSIS — N183 Chronic kidney disease, stage 3 unspecified: Secondary | ICD-10-CM | POA: Diagnosis present

## 2017-09-04 DIAGNOSIS — I10 Essential (primary) hypertension: Secondary | ICD-10-CM | POA: Diagnosis present

## 2017-09-04 DIAGNOSIS — E119 Type 2 diabetes mellitus without complications: Secondary | ICD-10-CM | POA: Insufficient documentation

## 2017-09-04 DIAGNOSIS — E785 Hyperlipidemia, unspecified: Secondary | ICD-10-CM | POA: Diagnosis not present

## 2017-09-04 DIAGNOSIS — D649 Anemia, unspecified: Secondary | ICD-10-CM | POA: Diagnosis present

## 2017-09-04 DIAGNOSIS — R200 Anesthesia of skin: Secondary | ICD-10-CM | POA: Diagnosis present

## 2017-09-04 HISTORY — DX: Type 2 diabetes mellitus without complications: E11.9

## 2017-09-04 HISTORY — DX: Transient cerebral ischemic attack, unspecified: G45.9

## 2017-09-04 HISTORY — DX: Type 2 diabetes mellitus without complications: Z79.4

## 2017-09-04 LAB — CBC
HCT: 35.6 % — ABNORMAL LOW (ref 36.0–46.0)
Hemoglobin: 11.4 g/dL — ABNORMAL LOW (ref 12.0–15.0)
MCH: 27.1 pg (ref 26.0–34.0)
MCHC: 32 g/dL (ref 30.0–36.0)
MCV: 84.6 fL (ref 78.0–100.0)
Platelets: 183 10*3/uL (ref 150–400)
RBC: 4.21 MIL/uL (ref 3.87–5.11)
RDW: 13.3 % (ref 11.5–15.5)
WBC: 5.5 10*3/uL (ref 4.0–10.5)

## 2017-09-04 LAB — COMPREHENSIVE METABOLIC PANEL
ALT: 11 U/L — ABNORMAL LOW (ref 14–54)
AST: 21 U/L (ref 15–41)
Albumin: 3.1 g/dL — ABNORMAL LOW (ref 3.5–5.0)
Alkaline Phosphatase: 86 U/L (ref 38–126)
Anion gap: 10 (ref 5–15)
BUN: 15 mg/dL (ref 6–20)
CO2: 22 mmol/L (ref 22–32)
Calcium: 9 mg/dL (ref 8.9–10.3)
Chloride: 107 mmol/L (ref 101–111)
Creatinine, Ser: 1.03 mg/dL — ABNORMAL HIGH (ref 0.44–1.00)
GFR calc Af Amer: 55 mL/min — ABNORMAL LOW (ref >60)
GFR calc non Af Amer: 48 mL/min — ABNORMAL LOW (ref >60)
Glucose, Bld: 152 mg/dL — ABNORMAL HIGH (ref 65–99)
Potassium: 3.8 mmol/L (ref 3.5–5.1)
Sodium: 139 mmol/L (ref 135–145)
Total Bilirubin: 0.3 mg/dL (ref 0.3–1.2)
Total Protein: 6.5 g/dL (ref 6.5–8.1)

## 2017-09-04 LAB — DIFFERENTIAL
Basophils Absolute: 0 10*3/uL (ref 0.0–0.1)
Basophils Relative: 0 %
Eosinophils Absolute: 0.1 10*3/uL (ref 0.0–0.7)
Eosinophils Relative: 2 %
Lymphocytes Relative: 29 %
Lymphs Abs: 1.6 10*3/uL (ref 0.7–4.0)
Monocytes Absolute: 0.4 10*3/uL (ref 0.1–1.0)
Monocytes Relative: 7 %
Neutro Abs: 3.4 10*3/uL (ref 1.7–7.7)
Neutrophils Relative %: 62 %

## 2017-09-04 LAB — GLUCOSE, CAPILLARY
Glucose-Capillary: 215 mg/dL — ABNORMAL HIGH (ref 65–99)
Glucose-Capillary: 122 mg/dL — ABNORMAL HIGH (ref 65–99)

## 2017-09-04 LAB — I-STAT CHEM 8, ED
BUN: 18 mg/dL (ref 6–20)
Calcium, Ion: 1.22 mmol/L (ref 1.15–1.40)
Chloride: 109 mmol/L (ref 101–111)
Creatinine, Ser: 0.9 mg/dL (ref 0.44–1.00)
Glucose, Bld: 144 mg/dL — ABNORMAL HIGH (ref 65–99)
HCT: 33 % — ABNORMAL LOW (ref 36.0–46.0)
Hemoglobin: 11.2 g/dL — ABNORMAL LOW (ref 12.0–15.0)
Potassium: 3.9 mmol/L (ref 3.5–5.1)
Sodium: 145 mmol/L (ref 135–145)
TCO2: 24 mmol/L (ref 22–32)

## 2017-09-04 LAB — APTT: aPTT: 26 seconds (ref 24–36)

## 2017-09-04 LAB — I-STAT TROPONIN, ED: Troponin i, poc: 0 ng/mL (ref 0.00–0.08)

## 2017-09-04 LAB — PROTIME-INR
INR: 1.04
Prothrombin Time: 13.5 seconds (ref 11.4–15.2)

## 2017-09-04 MED ORDER — INSULIN ASPART 100 UNIT/ML ~~LOC~~ SOLN: 0.0000 [IU] | Freq: Every day | SUBCUTANEOUS | Status: DC

## 2017-09-04 MED ORDER — ADULT MULTIVITAMIN W/MINERALS CH
1.0000 | ORAL_TABLET | Freq: Every day | ORAL | Status: DC
  Administered 2017-09-04 – 2017-09-05 (×2): 1 via ORAL
  Filled 2017-09-04 (×2): qty 1

## 2017-09-04 MED ORDER — ONE-DAILY MULTI VITAMINS PO TABS: 1.0000 | ORAL_TABLET | Freq: Every day | ORAL | Status: DC

## 2017-09-04 MED ORDER — PANTOPRAZOLE SODIUM 40 MG PO TBEC
40.0000 mg | DELAYED_RELEASE_TABLET | Freq: Every day | ORAL | Status: DC
  Administered 2017-09-04 – 2017-09-05 (×2): 40 mg via ORAL
  Filled 2017-09-04 (×2): qty 1

## 2017-09-04 MED ORDER — ACETAMINOPHEN 650 MG RE SUPP: 650.0000 mg | RECTAL | Status: DC | PRN

## 2017-09-04 MED ORDER — LOSARTAN POTASSIUM 50 MG PO TABS
50.0000 mg | ORAL_TABLET | Freq: Every day | ORAL | Status: DC
  Administered 2017-09-04 – 2017-09-05 (×2): 50 mg via ORAL
  Filled 2017-09-04 (×2): qty 1

## 2017-09-04 MED ORDER — VITAMIN B-6 100 MG PO TABS
100.0000 mg | ORAL_TABLET | Freq: Every day | ORAL | Status: DC
  Administered 2017-09-04 – 2017-09-05 (×2): 100 mg via ORAL
  Filled 2017-09-04 (×2): qty 1

## 2017-09-04 MED ORDER — INSULIN GLARGINE 100 UNIT/ML ~~LOC~~ SOLN
40.0000 [IU] | Freq: Every morning | SUBCUTANEOUS | Status: DC
  Filled 2017-09-04 (×2): qty 0.4

## 2017-09-04 MED ORDER — CLOPIDOGREL BISULFATE 75 MG PO TABS
75.0000 mg | ORAL_TABLET | Freq: Every day | ORAL | Status: DC
  Administered 2017-09-04 – 2017-09-05 (×2): 75 mg via ORAL
  Filled 2017-09-04 (×2): qty 1

## 2017-09-04 MED ORDER — ASPIRIN 325 MG PO TABS: 325.0000 mg | ORAL_TABLET | Freq: Every day | ORAL | Status: DC

## 2017-09-04 MED ORDER — INSULIN ASPART 100 UNIT/ML ~~LOC~~ SOLN
0.0000 [IU] | Freq: Three times a day (TID) | SUBCUTANEOUS | Status: DC
  Administered 2017-09-04: 3 [IU] via SUBCUTANEOUS
  Administered 2017-09-05: 2 [IU] via SUBCUTANEOUS

## 2017-09-04 MED ORDER — ASPIRIN 300 MG RE SUPP: 300.0000 mg | Freq: Every day | RECTAL | Status: DC

## 2017-09-04 MED ORDER — DIAZEPAM 2 MG PO TABS
2.0000 mg | ORAL_TABLET | Freq: Once | ORAL | Status: AC
  Administered 2017-09-04: 2 mg via ORAL
  Filled 2017-09-04: qty 1

## 2017-09-04 MED ORDER — STROKE: EARLY STAGES OF RECOVERY BOOK
Freq: Once | Status: DC
  Filled 2017-09-04 (×2): qty 1

## 2017-09-04 MED ORDER — ENOXAPARIN SODIUM 40 MG/0.4ML ~~LOC~~ SOLN
40.0000 mg | SUBCUTANEOUS | Status: DC
  Administered 2017-09-04: 40 mg via SUBCUTANEOUS
  Filled 2017-09-04: qty 0.4

## 2017-09-04 MED ORDER — ACETAMINOPHEN 160 MG/5ML PO SOLN: 650.0000 mg | ORAL | Status: DC | PRN

## 2017-09-04 MED ORDER — FERROUS SULFATE 325 (65 FE) MG PO TABS
325.0000 mg | ORAL_TABLET | Freq: Two times a day (BID) | ORAL | Status: DC
  Administered 2017-09-04 – 2017-09-05 (×2): 325 mg via ORAL
  Filled 2017-09-04 (×2): qty 1

## 2017-09-04 MED ORDER — DIAZEPAM 2 MG PO TABS
2.0000 mg | ORAL_TABLET | Freq: Once | ORAL | Status: DC
  Filled 2017-09-04: qty 1

## 2017-09-04 MED ORDER — ACETAMINOPHEN 325 MG PO TABS: 650.0000 mg | ORAL_TABLET | ORAL | Status: DC | PRN

## 2017-09-04 MED ORDER — OMEGA-3-ACID ETHYL ESTERS 1 G PO CAPS
3.0000 | ORAL_CAPSULE | Freq: Every day | ORAL | Status: DC
  Administered 2017-09-04 – 2017-09-05 (×2): 3 g via ORAL
  Filled 2017-09-04 (×2): qty 3

## 2017-09-04 MED ORDER — GEMFIBROZIL 600 MG PO TABS
600.0000 mg | ORAL_TABLET | Freq: Two times a day (BID) | ORAL | Status: DC
  Filled 2017-09-04 (×4): qty 1

## 2017-09-04 MED ORDER — METOPROLOL TARTRATE 25 MG PO TABS
25.0000 mg | ORAL_TABLET | Freq: Two times a day (BID) | ORAL | Status: DC
  Administered 2017-09-04 – 2017-09-05 (×2): 25 mg via ORAL
  Filled 2017-09-04 (×2): qty 1

## 2017-09-04 NOTE — ED Provider Notes (Signed)
[facility] EMERGENCY DEPARTMENT Provider Note   CSN: [id number] Arrival date & time: 09/04/17  0913     History   Chief Complaint Chief Complaint  Patient presents with  . R side numbness  . Aphasia    HPI Rebecca Perkins is a 84 y.o. female with a history of TIA (06/18/15 followed by Dr. Leonie Man), diabetes, hypertension, hyperlipidemia who presents ED today for right-sided numbness and aphasia.  Patient is present with son and daughter-in-law who help provide history.  Patient notes that she was eating breakfast at approximately 0730 this morning when she noticed weakness and numbness of her right arm, greatest in her hand.  She noted she was unable to hold her fork due to weakness.  When EMS arrived at approximately 0815, as is her son who reported that the patient had slurred speech from baseline.  Her symptoms resolved shortly after this and lasted less than 1 hour.  Patient notes that her symptoms have completely resolved and not returned.  Patient does note that she had episode of expressive aphasia while on the phone with daughter-in-law approximately 1 week that lasted for approximately 15 minutes before resolving.  Patient not followed up with neurology in several years.  She is now reporting a dull, achy headache on the right side.  No headaches or symptoms yesterday.  She denies any associated visual changes, diplopia, facial droop, changes in hearing, changes in gait, loss of consciousness, trauma, nausea/vomiting.  Patient was seen by PCP on 09/02/2017 in regards to a aphasic event 1 week ago.  PCP added 81 mg of aspirin onto 75 mg of Plavix patient was Artie taking.  She was also scheduled for outpatient carotid Dopplers and an echocardiogram.  No other new medications.  HPI  Past Medical History:  Diagnosis Date  . Anemia   . Diabetes mellitus   . Dyslipidemia   . GERD (gastroesophageal reflux disease)   . Hypertension   . Migraines   . Osteoporosis   .  PVD (peripheral vascular disease) ([place])    abi .83 (L), .92 (R)  . RSD (reflex sympathetic dystrophy) 2007   R wrist/hand following fx  . Stroke ([place])   . Varicose veins     Patient Active Problem List   Diagnosis Date Noted  . Aphasia 09/01/2015  . TIA (transient ischemic attack) 06/17/2015  . Osteoporosis 02/18/2015  . Normocytic anemia 12/13/2013  . Diabetes mellitus type II, uncontrolled ([place]) 12/10/2013  . Pneumonia 12/10/2013  . Sepsis ([place]) 12/09/2013  . Type 2 diabetes mellitus with insulin deficiency ([place])   . Hypertension   . Hypertriglyceridemia   . Dyslipidemia     Past Surgical History:  Procedure Laterality Date  . ABDOMINAL HYSTERECTOMY  1988  . APPENDECTOMY  1966  . BREAST CYST EXCISION    . CATARACT EXTRACTION  10/2009  . CHOLECYSTECTOMY  1989  . Several benign cyst removed     last 1 in 1972  . TUBAL LIGATION    . UMBILICAL HERNIA REPAIR      OB History    No data available       Home Medications    Prior to Admission medications   Medication Sig Start Date End Date Taking? Authorizing Provider  atorvastatin (LIPITOR) 40 MG tablet Take 1 tablet (40 mg total) by mouth daily. 09/02/17   [name], MD  Blood Glucose Monitoring Suppl (ONE TOUCH ULTRA SYSTEM KIT) W/DEVICE KIT 1 kit by Does not apply route once.  Tests Blood sugar before meals and at bedtime 07/10/14   [name], MD  clopidogrel (PLAVIX) 75 MG tablet TAKE 1 TABLET BY MOUTH DAILY 04/11/17   [name], MD  co-enzyme Q-10 30 MG capsule Take 100 mg by mouth 2 (two) times daily.    [provider]  ferrous sulfate 325 (65 FE) MG tablet Take 1 tablet (325 mg total) by mouth 3 (three) times daily with meals. Patient not taking: Reported on 06/27/2017 02/04/17   [name], MD  gemfibrozil (LOPID) 600 MG tablet TAKE 1 TABLET BY MOUTH TWICE DAILY BEFORE A MEAL 09/20/16   [name], MD  insulin lispro (HUMALOG KWIKPEN) 100 UNIT/ML KiwkPen 6-10 units at  mealtimes Patient taking differently: Use only if BS over 150 in am 10/28/15   Elayne Snare, MD  Insulin Pen Needle (B-D ULTRAFINE III SHORT PEN) 31G X 8 MM MISC USE AS DIRECTED TO INJECT TOUJEO DAILY 12/22/15   Elayne Snare, MD  INSULIN SYRINGE 1CC/29G 29G X 1/2" 1 ML MISC AS DIRECTED. 07/09/15   Elayne Snare, MD  losartan (COZAAR) 100 MG tablet TAKE 1 TABLET BY MOUTH EVERY DAY Patient taking differently: TAKE 1/2 TABLET BY MOUTH EVERY DAY 07/28/16   [name], MD  metoprolol (LOPRESSOR) 50 MG tablet TAKE 1/2 TABLET  BY MOUTH TWICE DAILY 06/19/15   Geradine Girt, DO  Multiple Vitamin (MULTIVITAMIN) tablet Take 1 tablet by mouth daily.      [provider]  omega-3 acid ethyl esters (LOVAZA) 1 g capsule TAKE 3 CAPSULES BY MOUTH DAILY 06/27/17   Elayne Snare, MD  omeprazole (PRILOSEC) 20 MG capsule TAKE ONE CAPSULE BY MOUTH DAILY 07/15/17   [name], MD  ONE TOUCH ULTRA TEST test strip USE FOUR TIMES DAILY BEFORE MEALS AND EVERY NIGHT AT BEDTIME 10/06/16   [name], MD  Pyridoxine HCl (VITAMIN B-6) 100 MG tablet Take 100 mg by mouth daily.      [provider]  TOUJEO SOLOSTAR 300 UNIT/ML SOPN INJECT [place] SKIN DAILY 06/13/17   Elayne Snare, MD  TRULICITY 8.12 XN/1.7GY [facility] INJECT INTO THE ABDOMINAL SKIN ONCE WEEKLY 02/06/17   Elayne Snare, MD    Family History Family History  Problem Relation Age of Onset  . Stroke Mother 36  . Hypertension Mother   . Clotting disorder Father   . Heart attack Father   . Arrhythmia Sister   . Stroke Brother   . Diabetes Neg Hx   . Thyroid disease Neg Hx     Social History Social History   Tobacco Use  . Smoking status: Never Smoker  . Smokeless tobacco: Never Used  Substance Use Topics  . Alcohol use: No  . Drug use: No     Allergies   Lipitor [atorvastatin calcium]; Niacin and related; Statins; and Macrobid [nitrofurantoin macrocrystal]   Review of Systems Review of Systems  All other  systems reviewed and are negative.    Physical Exam Updated Vital Signs BP (!) 191/75 (BP Location: Right Arm)   Pulse 65   Temp 98.7 F (37.1 C) (Oral)   Resp 20   Ht _0  (1.626 m)   Wt 62.6 kg (138 lb)   SpO2 100%   BMI 23.69 kg/m   Physical Exam  Constitutional: She appears well-developed and well-nourished.  Elderly female, resting in NAD.   HENT:  Head: Normocephalic and atraumatic.  Right Ear: External ear normal.  Left Ear:  External ear normal.  Nose: Nose normal.  Mouth/Throat: Uvula is midline, oropharynx is clear and moist and mucous membranes are normal. No tonsillar exudate.  Eyes: Pupils are equal, round, and reactive to light. Right eye exhibits no discharge. Left eye exhibits no discharge. No scleral icterus.  Neck: Trachea normal. Neck supple. No spinous process tenderness present. Carotid bruit is not present. No neck rigidity. Normal range of motion present.  Cardiovascular: Normal rate, regular rhythm and intact distal pulses.  No murmur heard. Pulses:      Radial pulses are 2+ on the right side, and 2+ on the left side.       Dorsalis pedis pulses are 2+ on the right side, and 2+ on the left side.       Posterior tibial pulses are 2+ on the right side, and 2+ on the left side.  No lower extremity swelling or edema. Calves symmetric in size bilaterally.  Pulmonary/Chest: Effort normal and breath sounds normal. She exhibits no tenderness.  Abdominal: Soft. Bowel sounds are normal. There is no tenderness. There is no rebound and no guarding.  Musculoskeletal: She exhibits no edema.  Lymphadenopathy:    She has no cervical adenopathy.  Neurological: She is alert.  Mental Status:  Alert, oriented, thought content appropriate, able to give a coherent history. Speech fluent without evidence of aphasia. Able to follow 2 step commands without difficulty.  Cranial Nerves:  II:  Peripheral visual fields grossly normal, pupils equal, round, reactive to  light III,IV, VI: ptosis not present, extra-ocular motions intact bilaterally  V,VII: smile symmetric, eyebrows raise symmetric, facial light touch sensation equal VIII: hearing grossly normal to voice  X: uvula elevates symmetrically  XI: bilateral shoulder shrug symmetric and strong XII: midline tongue extension without fassiculations Motor:  Normal tone. 5/5 in upper and lower extremities bilaterally including strong and equal grip strength and dorsiflexion/plantar flexion Sensory: Sensation intact to light touch in all extremities. Negative Romberg.  Deep Tendon Reflexes: 2+ and symmetric in the biceps and patella Cerebellar: normal finger-to-nose with bilateral upper extremities. Normal heel-to -shin balance bilaterally of the lower extremity. No pronator drift.  Gait: normal gait and balance CV: distal pulses palpable throughout   Skin: Skin is warm and dry. No rash noted. She is not diaphoretic.  Psychiatric: She has a normal mood and affect.  Nursing note and vitals reviewed.    ED Treatments / Results  Labs (all labs ordered are listed, but only abnormal results are displayed) Labs Reviewed  CBC - Abnormal; Notable for the following components:      Result Value   Hemoglobin 11.4 (*)    HCT 35.6 (*)    All other components within normal limits  COMPREHENSIVE METABOLIC PANEL - Abnormal; Notable for the following components:   Glucose, Bld 152 (*)    Creatinine, Ser 1.03 (*)    Albumin 3.1 (*)    ALT 11 (*)    GFR calc non Af Amer 48 (*)    GFR calc Af Amer 55 (*)    All other components within normal limits  I-STAT CHEM 8, ED - Abnormal; Notable for the following components:   Glucose, Bld 144 (*)    Hemoglobin 11.2 (*)    HCT 33.0 (*)    All other components within normal limits  PROTIME-INR  APTT  DIFFERENTIAL  I-STAT TROPONIN, ED  CBG MONITORING, ED    EKG  EKG Interpretation  Date/Time:  Sunday September 04 2017 11:04:13 EST Ventricular Rate:  70 PR  Interval:    QRS Duration: 113 QT Interval:  378 QTC Calculation: 408 R Axis:   36 Text Interpretation:  Sinus rhythm Prolonged PR interval Borderline intraventricular conduction delay No significant change since last tracing Baseline wander in III Confirmed by Duffy Bruce [phone]) on 09/04/2017 3:47:14 PM       Radiology Ct Head Wo Contrast  Result Date: 09/04/2017 CLINICAL DATA:  Right-sided numbness and slurred speech, now resolved. EXAM: CT HEAD WITHOUT CONTRAST TECHNIQUE: Contiguous axial images were obtained from the base of the skull through the vertex without intravenous contrast. COMPARISON:  Brain MRI 01/13/2017 FINDINGS: Brain: There is no evidence of acute infarct, intracranial hemorrhage, mass, midline shift, or extra-axial fluid collection. Cerebral atrophy is within normal limits for age. Patchy cerebral white matter hypodensities are nonspecific but compatible with moderate chronic small vessel ischemic disease. A chronic infarct is again noted just anterior to the left basal ganglia. There also chronic lacunar infarcts in the thalami. Vascular: Calcified atherosclerosis at the skull base. No hyperdense vessel. Skull: No fracture or focal osseous lesion. Sinuses/Orbits: Visualized paranasal sinuses and mastoid air cells are clear. Bilateral cataract extraction is noted. Other: None. IMPRESSION: 1. No evidence of acute intracranial abnormality. 2. Moderate chronic small vessel ischemic disease. Electronically Signed   By: Logan Bores M.D.   On: 09/04/2017 10:10    Procedures Procedures (including critical care time)  Medications Ordered in ED Medications - No data to display   Initial Impression / Assessment and Plan / ED Course  I have reviewed the triage vital signs and the nursing notes.  Pertinent labs & imaging results that were available during my care of the patient were reviewed by me and considered in my medical decision making (see chart for details).      84 year old female with 5 minutes of right arm weakness, numbness with associated slurred speech this morning.  She also reports episode of expressive aphasia last week that lasted approximately 15 minutes.  Symptoms have now resolved and have not recurred.  Patient is currently asymptomatic.  Vital signs are reassuring.  Exam without any focal neurologic deficits.  NIH 0.   Patient was seen by PCP on 09/02/2017 in regards to a aphasic event 1 week ago.  PCP added 81 mg of aspirin onto 75 mg of Plavix patient was Artie taking.  She was also scheduled for outpatient carotid Dopplers and an echocardiogram.    On chart review last MRI of the brain without contrast on 01/13/2017 shows atrophy of small vessel disease without any acute intracranial findings.  There is also sequela of hypertension history of vascular disease with multiple micro bleeds. Previous carotid ultrasound shows 1-39% ICA stenosis.  Prior echocardiogram shows EF of 50-55% with LVH.  As stated prior  Patient does have bilateral carotid ultrasounds and echocardiogram ordered 2 days ago by PCP.  CT head without any evidence of acute intracranial abnormalities.  There is moderate chronic small vessel ischemic disease.  EKG sinus rhythm.  There is possible prolonged PR interval.  Lab work reassuring.  No electrolyte abnormalities.  Kidney function within normal limits.  Mild anemia.  No leukocytosis.  Troponin 0.00.   Will consult neurology.   Appreciate call back from neurology, Dr. Lorraine Lax, who recommends admission for TIA workup given the patients RF's and timeframe since last workup. He will consult and follow on the patient.   Patient to be admitted under Dr. Jamse Arn.  Patient is hemodynamically stable and appears safe for  admission.  Family and patient are in agreement. Final Clinical Impressions(s) / ED Diagnoses   Final diagnoses:  TIA (transient ischemic attack)    ED Discharge Orders    None       Lorelle Gibbs 09/04/17 1612    Tanna Furry, MD 09/05/17 [phone]

## 2017-09-04 NOTE — ED Triage Notes (Signed)
Pt in from home via [facility] with R side numbness and slurred speech that began this morning at 0730. Pt states the symptoms resolved around 0815, around when EMS arrived. Hx of TIA's, HTN, DM, takes Plavix. BP 202/84 PTA, denies any HA or vision changes. A&ox4, speech clear, MAE's equally

## 2017-09-04 NOTE — Consult Note (Signed)
Marland Kitchen Requesting Physician: Dr.Chatterjee, Raiford Noble*    Chief Complaint: "difficulty speaking and right hand weakness  History obtained from: Patient and Chart     HPI:                                                                                                                                       Rebecca Perkins is an 84 y.o. female with past medical history of a TIA in 2016- started on Plavix at that time, uncontrolled diabetes mellitus on insulin, orbital migraines, hypertension, familial dyslipidemia with statin intolerance, mild peripheral vascular neuropathy, anemia who was brought into the ED today by EMS due to acute right arm weakness and  expressive aphasia.  Of note patient has a history of TIA, initial one was 2016 with expressive aphasia that lasted for about 20 minutes, she had one subsequent episode that after, and one episode last week Friday, January 25, where she had a called her daughter-in-law but had expressive aphasia and could not make out the words that she wanted and had nonsensical speech.  This episode lasted for about 20 minutes.  She thought she had those episodes because of hypoglycemia.  Before this episode occurred she had checked her blood sugar was 90 and it was checked by EMS upon arrival and was 120 .patient also has a history of migraines and orbital area with aura of blurred vision, but symptoms resolved with Tylenol.  Patient states she averages 2-3 migraines a year and had a migraine preceding the episode last week Friday. This morning around 9:00 patient was eating her breakfast and noted right hand numbness, tingling, weakness -she was unable to even hold the fork up.  She had an episode of dysarthria with expressive aphasia -when she could not find the right words and all the symptoms spontaneously resolved in about 40 minutes.  The only difference between this episode and her previous episodes of TIA was the right arm weakness.    On admission to the ED  initial CT of the head was unremarkable.  Patient had had a physical with her PCP, and informed him of her symptoms of TIA last week.  At baseline patient is on gemfibrozil and Plavix. He was concerned and started patient on aspirin 81 mg as well as Lipitor for secondary stroke prevention.  Patient states she is unable to take Lipitor/Crestor or any statin secondary to myopathy.  Patient denies any recent viral illnesses, dizziness, ocular pressure or pain, chest pain, shortness of breath, fevers, chills, nausea, vomiting or unsteady gait.  Date last known well: 09/04/17 Time last known well: 0900 tPA Given: No, symptoms had resolved NIHSS: 0 Baseline MRS: 0   Past Medical History:  Diagnosis Date  . Anemia   . Diabetes mellitus type 2, insulin dependent ([place])   . Dyslipidemia   . GERD (gastroesophageal reflux disease)   . Hypertension   . Migraines   .  Osteoporosis   . PVD (peripheral vascular disease) ([place])    abi .83 (L), .92 (R)  . RSD (reflex sympathetic dystrophy) 2007   R wrist/hand following fx  . TIA (transient ischemic attack)   . Varicose veins     Past Surgical History:  Procedure Laterality Date  . ABDOMINAL HYSTERECTOMY  1988  . APPENDECTOMY  1966  . BREAST CYST EXCISION    . CATARACT EXTRACTION  10/2009  . CHOLECYSTECTOMY  1989  . Several benign cyst removed     last 1 in 1972  . TUBAL LIGATION    . UMBILICAL HERNIA REPAIR      Family History  Problem Relation Age of Onset  . Stroke Mother 51  . Hypertension Mother   . Clotting disorder Father   . Heart attack Father   . Arrhythmia Sister   . Stroke Brother   . Diabetes Neg Hx   . Thyroid disease Neg Hx    Social History:  reports that  has never smoked. she has never used smokeless tobacco. She reports that she does not drink alcohol or use drugs.  Allergies:  Allergies  Allergen Reactions  . Lipitor [Atorvastatin Calcium] Diarrhea  . Niacin And Related Hives  . Statins Diarrhea  . Macrobid  [Nitrofurantoin Macrocrystal] Other (See Comments)    unknown    Medications:                                                                                                                         Current Facility-Administered Medications:  .   stroke: mapping our early stages of recovery book, , Does not apply, Once, Samella Parr, NP .  acetaminophen (TYLENOL) tablet 650 mg, 650 mg, Oral, Q4H PRN **OR** acetaminophen (TYLENOL) solution 650 mg, 650 mg, Per Tube, Q4H PRN **OR** acetaminophen (TYLENOL) suppository 650 mg, 650 mg, Rectal, Q4H PRN, Samella Parr, NP .  clopidogrel (PLAVIX) tablet 75 mg, 75 mg, Oral, Daily, Samella Parr, NP .  [COMPLETED] diazepam (VALIUM) tablet 2 mg, 2 mg, Oral, Once, 2 mg at 09/04/17 1321 **FOLLOWED BY** diazepam (VALIUM) tablet 2 mg, 2 mg, Oral, Once, Samella Parr, NP .  enoxaparin (LOVENOX) injection 40 mg, 40 mg, Subcutaneous, Q24H, Samella Parr, NP .  ferrous sulfate tablet 325 mg, 325 mg, Oral, BID WC, Samella Parr, NP .  gemfibrozil (LOPID) tablet 600 mg, 600 mg, Oral, BID AC, Samella Parr, NP .  insulin aspart (novoLOG) injection 0-5 Units, 0-5 Units, Subcutaneous, QHS, Erin Hearing L, NP .  insulin aspart (novoLOG) injection 0-9 Units, 0-9 Units, Subcutaneous, TID WC, Samella Parr, NP .  insulin glargine (LANTUS) injection 40 Units, 40 Units, Subcutaneous, q morning - 10a, Samella Parr, NP .  losartan (COZAAR) tablet 50 mg, 50 mg, Oral, Daily, Erin Hearing L, NP .  metoprolol tartrate (LOPRESSOR) tablet 25 mg, 25 mg, Oral, BID, Erin Hearing L, NP .  multivitamin tablet 1 tablet, 1 tablet,  Oral, Daily, Samella Parr, NP .  omega-3 acid ethyl esters (LOVAZA) capsule 3 g, 3 capsule, Oral, Daily, Samella Parr, NP .  pantoprazole (PROTONIX) EC tablet 40 mg, 40 mg, Oral, Daily, Erin Hearing L, NP .  pyridOXINE (VITAMIN B-6) tablet 100 mg, 100 mg, Oral, Daily, Samella Parr, NP  Current Outpatient  Medications:  .  Blood Glucose Monitoring Suppl (ONE TOUCH ULTRA SYSTEM KIT) W/DEVICE KIT, 1 kit by Does not apply route once. Tests Blood sugar before meals and at bedtime, Disp: 1 each, Rfl: 0 .  clopidogrel (PLAVIX) 75 MG tablet, TAKE 1 TABLET BY MOUTH DAILY, Disp: 30 tablet, Rfl: 11 .  ferrous sulfate 325 (65 FE) MG tablet, Take 1 tablet (325 mg total) by mouth 3 (three) times daily with meals. (Patient taking differently: Take 325 mg by mouth 2 (two) times daily with a meal. ), Disp: 90 tablet, Rfl: 4 .  gemfibrozil (LOPID) 600 MG tablet, TAKE 1 TABLET BY MOUTH TWICE DAILY BEFORE A MEAL, Disp: 60 tablet, Rfl: 11 .  Insulin Glargine (TOUJEO MAX SOLOSTAR) 300 UNIT/ML SOPN, Inject 40 Units into the skin every morning., Disp: , Rfl:  .  Insulin Pen Needle (B-D ULTRAFINE III SHORT PEN) 31G X 8 MM MISC, USE AS DIRECTED TO INJECT TOUJEO DAILY, Disp: 100 each, Rfl: 5 .  INSULIN SYRINGE 1CC/29G 29G X 1/2" 1 ML MISC, AS DIRECTED., Disp: 100 each, Rfl: 3 .  losartan (COZAAR) 100 MG tablet, TAKE 1 TABLET BY MOUTH EVERY DAY (Patient taking differently: TAKE 1/2 TABLET BY MOUTH EVERY DAY), Disp: 30 tablet, Rfl: 11 .  metoprolol (LOPRESSOR) 50 MG tablet, TAKE 1/2 TABLET  BY MOUTH TWICE DAILY, Disp: 180 tablet, Rfl: 0 .  Multiple Vitamin (MULTIVITAMIN) tablet, Take 1 tablet by mouth daily.  , Disp: , Rfl:  .  omega-3 acid ethyl esters (LOVAZA) 1 g capsule, TAKE 3 CAPSULES BY MOUTH DAILY, Disp: 60 capsule, Rfl: 0 .  omeprazole (PRILOSEC) 20 MG capsule, TAKE ONE CAPSULE BY MOUTH DAILY, Disp: 90 capsule, Rfl: 3 .  ONE TOUCH ULTRA TEST test strip, USE FOUR TIMES DAILY BEFORE MEALS AND EVERY NIGHT AT BEDTIME, Disp: 150 each, Rfl: 5 .  Pyridoxine HCl (VITAMIN B-6) 100 MG tablet, Take 100 mg by mouth daily.  , Disp: , Rfl:  .  TOUJEO SOLOSTAR 300 UNIT/ML SOPN, INJECT 46 UNITS UNDER THE SKIN DAILY, Disp: 14 pen, Rfl: 1 .  TRULICITY 0.17 CB/4.4HQ SOPN, INJECT INTO THE ABDOMINAL SKIN ONCE WEEKLY, Disp: 2 mL, Rfl: 3 .   atorvastatin (LIPITOR) 40 MG tablet, Take 1 tablet (40 mg total) by mouth daily., Disp: 90 tablet, Rfl: 3 .  insulin lispro (HUMALOG KWIKPEN) 100 UNIT/ML KiwkPen, 6-10 units at mealtimes (Patient taking differently: Use only if BS over 150 in am), Disp: 15 mL, Rfl: 1   ROS:  13 point systems reviewed and negative except above above in HPI   Examination:                                                                                                    General: Appears well-developed and well-nourished.  Psych: Affect appropriate to situation Eyes: No scleral injection HENT: No OP obstrucion Head: Normocephalic.  Cardiovascular: Normal rate and regular rhythm.  Respiratory: No increased work of breathing, no wheezes or rhonchi noted GI: Soft.  No distension. There is no tenderness.  Skin: WDI   Neurological Examination Mental Status: Alert, oriented, thought content appropriate.  Speech fluent without evidence of aphasia. Able to follow 3 step commands without difficulty. Cranial Nerves: II: Visual fields grossly normal,  III,IV, VI: ptosis not present, extra-ocular motions intact bilaterally, pupils equal, round, reactive to light and accommodation V,VII: smile symmetric, facial light touch sensation normal bilaterally VIII: hearing normal to voice IX,X: uvula rises symmetrically XI: bilateral shoulder shrug XII: midline tongue extension Motor: Right : Upper extremity   5/5    Left:     Upper extremity   5/5  Lower extremity   5/5     Lower extremity   5/5 Tone and bulk:normal tone throughout; no atrophy noted Sensory: Pinprick and light touch intact throughout, bilaterally, noted neuropathy under the sole of her feet Deep Tendon Reflexes: Muted Plantars: Right: downgoing   Left: downgoing Cerebellar: normal finger-to-nose, normal rapid alternating  movements and normal heel-to-shin test Gait: normal gait and station     Lab Results: Basic Metabolic Panel: Recent Labs  Lab 09/02/17 0914 09/04/17 0922 09/04/17 1103  NA 144 139 145  K 4.5 3.8 3.9  CL 108 107 109  CO2 26 22  --   GLUCOSE 154* 152* 144*  BUN '15 15 18  '$ CREATININE 0.93* 1.03* 0.90  CALCIUM 9.8 9.0  --     CBC: Recent Labs  Lab 09/02/17 0914 09/04/17 0922 09/04/17 1103  WBC 6.0 5.5  --   NEUTROABS 2,994 3.4  --   HGB 12.2 11.4* 11.2*  HCT 36.8 35.6* 33.0*  MCV 79.8* 84.6  --   PLT 223 183  --     Coagulation Studies: Recent Labs    09/04/17 0922  LABPROT 13.5  INR 1.04    Imaging: Ct Head Wo Contrast 09/04/2017 IMPRESSION: 1. No evidence of acute intracranial abnormality.  2. Moderate chronic small vessel ischemic disease.  ASSESSMENT   Rebecca Perkins is an 84 y.o. female with past medical history of a TIA in 2016- started on Plavix at that time, uncontrolled diabetes mellitus on insulin, orbital migraines, hypertension, familial dyslipidemia with statin intolerance, mild peripheral vascular neuropathy, anemia who was brought into the ED today by EMS due to acute right arm weakness and  expressive aphasia.  Symptoms spontaneously resolved in about 45 minutes and on presentation to the ER symptoms had resolved.  1.  TIA vs Acute ischemic stroke : in patient with a history of recurrent TIAs with deficits of expressive aphasia and now right arm weakness.  Patient has been  on Plavix and gemfibrozil for secondary prevention.  Despite the medications patient has recurrent TIAs and was recently started on aspirin.  We will continue aspirin and Plavix for now.    We will initiate a full stroke workup for completeness as patient is at very high risk for stroke due to a familial history with both mother and father dying from a stroke, patient's personal history of uncontrolled diabetes mellitus, hypertension, familial dyslipidemia with very elevated  triglycerides. 2.  Familial hyperlipidemia-No statin at this time secondary to patient's history of statin-induced myopathy but will continue gemfibrozil. 3. Hypertension: We will continue permissive hypertension for now  Stroke Risk Factors - diabetes mellitus, family history, hyperlipidemia and hypertension  Plan: - HgbA1c,  - MRI, MRA  of the brain without contrast - PT consult, OT consult, Speech consult - Echocardiogram - Carotid dopplers - Prophylactic therapy-Antiplatelet med: Aspirin '81mg'$ /Plavix -Continue gemfibrozil -no statin at this time secondary to diffuse myopathy - Risk factor modification - Telemetry monitoring -  Frequent neuro checks - BP permissive HTN upto 220/110    [facility] [phone]  09/04/2017, 1:42 PM  NEUROHOSPITALIST ADDENDUM Seen and examined the patient today. Formulated plan as documented above by ARNP  I agree with recommendations as above. Will follow.  Karena Addison Sebron Mcmahill MD [facility] [phone]  If 7pm to 7am, please call on call as listed on AMION.

## 2017-09-04 NOTE — H&P (Signed)
 History and Physical    Rebecca Perkins MRN:[id number] DOB: 04/11/1929 DOA: 09/04/2017  **Will placed in observation status based on the expectation that the patient will need hospitalization/ hospital care for </= 24 hrs  PCP: Pickard, Warren T, MD   Attending physician: Chatterjee  Patient coming from/Resides with: Private residence  Chief Complaint: TIA symptoms  HPI: Rebecca Perkins is a 84 y.o. female with medical history significant for TIA in 2015, diabetes on insulin, hypertension, dyslipidemia with statin intolerance, mild peripheral vascular disease, reflux and anemia.  Patient presents to the ER today after developing right side numbness with slurred speech duration of about 45 minutes.  EMS was called to the home but upon their arrival symptoms had resolved.  Patient and family also report last Saturday patient had an episode of dysarthria with nonsensical speech that lasted a few minutes.  Patient reports that prior to onset of the symptoms she felt her CBGs were "too low" and after she ate some food the symptoms resolved.  CT of the head in the ER unremarkable.  Formal neurological consultation is pending.  Patient will be placed in observation status in regards to TIA evaluation.  ED Course:  Vital Signs: BP (!) 119/53   Pulse 70   Temp 98.7 F (37.1 C) (Oral)   Resp 17   Ht 5' 4" (1.626 m)   Wt 62.6 kg (138 lb)   SpO2 96%   BMI 23.69 kg/m  CT head without contrast: Neg Lab data: Sodium 139, potassium 3.8, chloride 107, CO2 22, glucose 152, BUN 15, creatinine 1.03, anion gap 10, LFTs within normal limits, albumin 3.1, white count 5500 with hemoglobin 11.4, platelets 183,000, normal differential Medications and treatments: None  Review of Systems:  In addition to the HPI above,  No Fever-chills, myalgias or other constitutional symptoms No Headache, changes with Vision or hearing, dizziness, gait disturbance or imbalance, tremors or seizure activity No  problems swallowing food or Liquids, indigestion/reflux, choking or coughing while eating, abdominal pain with or after eating No Chest pain, Cough or Shortness of Breath, palpitations, orthopnea or DOE No Abdominal pain, N/V, melena,hematochezia, dark tarry stools, constipation No dysuria, malodorous urine, hematuria or flank pain No new skin rashes, lesions, masses or bruises, No new joint pains, aches, swelling or redness No recent unintentional weight gain or loss No polyuria, polydypsia or polyphagia   Past Medical History:  Diagnosis Date  . Anemia   . Diabetes mellitus type 2, insulin dependent (HCC)   . Dyslipidemia   . GERD (gastroesophageal reflux disease)   . Hypertension   . Migraines   . Osteoporosis   . PVD (peripheral vascular disease) (HCC)    abi .83 (L), .92 (R)  . RSD (reflex sympathetic dystrophy) 2007   R wrist/hand following fx  . TIA (transient ischemic attack)   . Varicose veins     Past Surgical History:  Procedure Laterality Date  . ABDOMINAL HYSTERECTOMY  1988  . APPENDECTOMY  1966  . BREAST CYST EXCISION    . CATARACT EXTRACTION  10/2009  . CHOLECYSTECTOMY  1989  . Several benign cyst removed     last 1 in 1972  . TUBAL LIGATION    . UMBILICAL HERNIA REPAIR      Social History   Socioeconomic History  . Marital status: Widowed    Spouse name: Not on file  . Number of children: 3  . Years of education: Not on file  . Highest education level: Not   on file  Social Needs  . Financial resource strain: Not on file  . Food insecurity - worry: Not on file  . Food insecurity - inability: Not on file  . Transportation needs - medical: Not on file  . Transportation needs - non-medical: Not on file  Occupational History  . Occupation: Retired  Tobacco Use  . Smoking status: Never Smoker  . Smokeless tobacco: Never Used  Substance and Sexual Activity  . Alcohol use: No  . Drug use: No  . Sexual activity: Not on file  Other Topics Concern  .  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 Lipitor [Atorvastatin Calcium] Diarrhea  . Niacin And Related Hives  . Statins Diarrhea  . Macrobid [Nitrofurantoin Macrocrystal] Other (See Comments)    unknown    Family History  Problem Relation Age of Onset  . Stroke Mother 20  . Hypertension Mother   . Clotting disorder Father   . Heart attack Father   . Arrhythmia Sister   . Stroke Brother   . Diabetes Neg Hx   . Thyroid disease Neg Hx      Prior to Admission medications   Medication Sig Start Date End Date Taking? Authorizing Provider  Blood Glucose Monitoring Suppl (ONE TOUCH ULTRA SYSTEM KIT) W/DEVICE KIT 1 kit by Does not apply route once. Tests Blood sugar before meals and at bedtime 07/10/14  Yes [name], MD  clopidogrel (PLAVIX) 75 MG tablet TAKE 1 TABLET BY MOUTH DAILY 04/11/17  Yes [name], MD  ferrous sulfate 325 (65 FE) MG tablet Take 1 tablet (325 mg total) by mouth 3 (three) times daily with meals. Patient taking differently: Take 325 mg by mouth 2 (two) times daily with a meal.  02/04/17  Yes [name], MD  gemfibrozil (LOPID) 600 MG tablet TAKE 1 TABLET BY MOUTH TWICE DAILY BEFORE A MEAL 09/20/16  Yes [name], MD  Insulin Glargine (TOUJEO MAX SOLOSTAR) 300 UNIT/ML SOPN Inject 40 Units into the skin every morning.   Yes [provider]  Insulin Pen Needle (B-D ULTRAFINE III SHORT PEN) 31G X 8 MM MISC USE AS DIRECTED TO INJECT TOUJEO DAILY 12/22/15  Yes Elayne Snare, MD  INSULIN SYRINGE 1CC/29G 29G X 1/2" 1 ML MISC AS DIRECTED. 07/09/15  Yes Elayne Snare, MD  losartan (COZAAR) 100 MG tablet TAKE 1 TABLET BY MOUTH EVERY DAY Patient taking differently: TAKE 1/2 TABLET BY MOUTH EVERY DAY 07/28/16  Yes [name],  MD  metoprolol (LOPRESSOR) 50 MG tablet TAKE 1/2 TABLET  BY MOUTH TWICE DAILY 06/19/15  Yes Eulogio Bear U, DO  Multiple Vitamin (MULTIVITAMIN) tablet Take 1 tablet by mouth daily.     Yes [provider]  omega-3 acid ethyl esters (LOVAZA) 1 g capsule TAKE 3 CAPSULES BY MOUTH DAILY 06/27/17  Yes Elayne Snare, MD  omeprazole (PRILOSEC) 20 MG capsule TAKE ONE CAPSULE BY MOUTH DAILY 07/15/17  Yes [name], MD  ONE TOUCH ULTRA TEST test strip USE FOUR TIMES DAILY BEFORE MEALS AND EVERY NIGHT AT BEDTIME 10/06/16  Yes [name], MD  Pyridoxine HCl (VITAMIN B-6) 100 MG tablet Take 100 mg by mouth daily.     Yes [provider]  Obie Dredge  300 UNIT/ML SOPN INJECT 46 UNITS UNDER THE SKIN DAILY 06/13/17  Yes Elayne Snare, MD  TRULICITY 0.31 RX/4.5OP SOPN INJECT INTO THE ABDOMINAL SKIN ONCE WEEKLY 02/06/17  Yes Elayne Snare, MD  atorvastatin (LIPITOR) 40 MG tablet Take 1 tablet (40 mg total) by mouth daily. 09/02/17   [name], MD  insulin lispro (HUMALOG KWIKPEN) 100 UNIT/ML KiwkPen 6-10 units at mealtimes Patient taking differently: Use only if BS over 150 in am 10/28/15   Elayne Snare, MD    Physical Exam: Vitals:   09/04/17 1115 09/04/17 1130 09/04/17 1145 09/04/17 1200  BP: (!) 149/54 (!) 132/51 (!) 101/56 (!) 119/53  Pulse: 64 66 66 70  Resp: (!) _0 Temp:      TempSrc:      SpO2: 99% 96% 98% 96%  Weight:      Height:          Constitutional: NAD, calm, comfortable Eyes: PERRL, lids and conjunctivae normal ENMT: Mucous membranes are moist. Posterior pharynx clear of any exudate or lesions.Normal dentition.  Neck: normal, supple, no masses, no thyromegaly Respiratory: clear to auscultation bilaterally, no wheezing, no crackles. Normal respiratory effort. No accessory muscle use.  Cardiovascular: Regular rate and rhythm, no murmurs / rubs / gallops. No extremity edema. 2+ pedal pulses. No carotid bruits.  Abdomen: no tenderness, no masses  palpated. No hepatosplenomegaly. Bowel sounds positive.  Musculoskeletal: no clubbing / cyanosis. No joint deformity upper and lower extremities. Good ROM, no contractures. Normal muscle tone.  Skin: no rashes, lesions, ulcers. No induration Neurologic: CN 2-12 grossly intact. Sensation intact, DTR normal. Strength 5/5 x all 4 extremities.  Psychiatric: Normal judgment and insight. Alert and oriented x 3. Normal mood.    Labs on Admission: I have personally reviewed following labs and imaging studies  CBC: Recent Labs  Lab 09/02/17 0914 09/04/17 0922 09/04/17 1103  WBC 6.0 5.5  --   NEUTROABS 2,994 3.4  --   HGB 12.2 11.4* 11.2*  HCT 36.8 35.6* 33.0*  MCV 79.8* 84.6  --   PLT 223 183  --    Basic Metabolic Panel: Recent Labs  Lab 09/02/17 0914 09/04/17 0922 09/04/17 1103  NA 144 139 145  K 4.5 3.8 3.9  CL 108 107 109  CO2 26 22  --   GLUCOSE 154* 152* 144*  BUN _1 CREATININE 0.93* 1.03* 0.90  CALCIUM 9.8 9.0  --    GFR: Estimated Creatinine Clearance: 38.7 mL/min (by C-G formula based on SCr of 0.9 mg/dL). Liver Function Tests: Recent Labs  Lab 09/02/17 0914 09/04/17 0922  AST 13 21  ALT 8 11*  ALKPHOS  --  86  BILITOT 0.3 0.3  PROT 7.1 6.5  ALBUMIN  --  3.1*   No results for input(s): LIPASE, AMYLASE in the last 168 hours. No results for input(s): AMMONIA in the last 168 hours. Coagulation Profile: Recent Labs  Lab 09/04/17 0922  INR 1.04   Cardiac Enzymes: No results for input(s): CKTOTAL, CKMB, CKMBINDEX, TROPONINI in the last 168 hours. BNP (last 3 results) No results for input(s): PROBNP in the last 8760 hours. HbA1C: No results for input(s): HGBA1C in the last 72 hours. CBG: No results for input(s): GLUCAP in the last 168 hours. Lipid Profile: Recent Labs    09/02/17 0914  CHOL 213*  HDL 29*  TRIG 213*  CHOLHDL 7.3*   Thyroid Function Tests: No results for input(s): TSH, T4TOTAL, FREET4, T3FREE, THYROIDAB in  the last 72  hours. Anemia Panel: No results for input(s): VITAMINB12, FOLATE, FERRITIN, TIBC, IRON, RETICCTPCT in the last 72 hours. Urine analysis:    Component Value Date/Time   COLORURINE YELLOW 04/27/2017 1553   APPEARANCEUR CLOUDY (A) 04/27/2017 1553   LABSPEC 1.025 04/27/2017 1553   PHURINE 5.5 04/27/2017 1553   GLUCOSEU NEGATIVE 04/27/2017 1553   GLUCOSEU NEGATIVE 02/10/2016 0946   HGBUR 2+ (A) 04/27/2017 1553   BILIRUBINUR NEGATIVE 02/11/2017 1509   KETONESUR NEGATIVE 04/27/2017 1553   PROTEINUR 1+ (A) 04/27/2017 1553   UROBILINOGEN 0.2 02/10/2016 0946   NITRITE NEGATIVE 04/27/2017 1553   LEUKOCYTESUR 2+ (A) 04/27/2017 1553   Sepsis Labs: _0 (procalcitonin:4,lacticidven:4) )No results found for this or any previous visit (from the past 240 hour(s)).   Radiological Exams on Admission: Ct Head Wo Contrast  Result Date: 09/04/2017 CLINICAL DATA:  Right-sided numbness and slurred speech, now resolved. EXAM: CT HEAD WITHOUT CONTRAST TECHNIQUE: Contiguous axial images were obtained from the base of the skull through the vertex without intravenous contrast. COMPARISON:  Brain MRI 01/13/2017 FINDINGS: Brain: There is no evidence of acute infarct, intracranial hemorrhage, mass, midline shift, or extra-axial fluid collection. Cerebral atrophy is within normal limits for age. Patchy cerebral white matter hypodensities are nonspecific but compatible with moderate chronic small vessel ischemic disease. A chronic infarct is again noted just anterior to the left basal ganglia. There also chronic lacunar infarcts in the thalami. Vascular: Calcified atherosclerosis at the skull base. No hyperdense vessel. Skull: No fracture or focal osseous lesion. Sinuses/Orbits: Visualized paranasal sinuses and mastoid air cells are clear. Bilateral cataract extraction is noted. Other: None. IMPRESSION: 1. No evidence of acute intracranial abnormality. 2. Moderate chronic small vessel ischemic disease. Electronically  Signed   By: Logan Bores M.D.   On: 09/04/2017 10:10    EKG: (Independently reviewed) sinus rhythm with ventricular rate 70 bpm, QTC 408 ms, normal R wave rotation, nonspecific ST changes in inferior lateral leads unchanged from previous EKG without any definitive ischemic changes  Assessment/Plan Principal Problem:   TIA (transient ischemic attack) -Patient presents with transient right-sided weakness and dysarthria with apparent dysarthria symptoms 1 week ago on Saturday that may have been attributed to hypoglycemia noting acute symptoms have resolved prior to patient arriving to ER; history of TIA symptoms in 2015 -Neurology placed patient on Plavix in 2015 for secondary stroke prevention-PCP added full dose aspirin on 2/1-attending physician reviewed the literature and there are no long-term benefits or improved outcomes for patients on DAPT for stroke; defer to neurology as to whether patient may benefit changing to Aggrenox -Patient statin intolerant with diarrhea and leg pain-PCP also prescribed statin on 2/1 but patient did not start until able to speak with PCP -Lipid panel obtained on 2/1 (see below); obtain HgbA1c -Stat MRI/MRA brain -Carotid duplex and echocardiogram -Frequent neurological checks -PT/OT/SLP evaluation -Formal neurology consultation pending  Active Problems:   HTN (hypertension) -Blood pressure currently well controlled -Continue preadmission Cozaar and Lopressor    Diabetes mellitus type 2, insulin dependent -Current CBG less than 200 -Continue Toujeo -Takes Trulicity on Saturdays -Patient utilizes sliding scale Humalog insulin before a.m. meal if blood sugar over 150 -Outpatient endocrinologist Dr. Dwyane Dee    HLD (hyperlipidemia) -Statin intolerant as above -Continue omega-3 fatty acids    CKD (chronic kidney disease) stage 3, GFR 30-59 ml/min  -Renal function stable and at baseline -Avoid nephrotoxic medications    GERD (gastroesophageal reflux  disease) -Continue PPI    Anemia -Continue multiple  vitamin and oral iron replacement      DVT prophylaxis: Lovenox Code Status: Full Family Communication: Family at bedside Disposition Plan: Home Consults called: Neuro hospitalist/[place]    , L. ANP-BC [facility] Pager [phone]   If 7PM-7AM, please contact night-coverage [email or web address] Password [email or web address]  09/04/2017, 12:34 PM    

## 2017-09-04 NOTE — ED Notes (Signed)
Attempted report 

## 2017-09-04 NOTE — Progress Notes (Signed)
Pt arrived to unit @1438 . MD notified. VSS, No new concerns.   MD gave SBP parameters under 220.

## 2017-09-04 NOTE — ED Notes (Signed)
Report given to RN, pt being transported to MRI.

## 2017-09-05 ENCOUNTER — Observation Stay (HOSPITAL_BASED_OUTPATIENT_CLINIC_OR_DEPARTMENT_OTHER): Payer: Medicare Other

## 2017-09-05 DIAGNOSIS — E785 Hyperlipidemia, unspecified: Secondary | ICD-10-CM | POA: Diagnosis not present

## 2017-09-05 DIAGNOSIS — I5030 Unspecified diastolic (congestive) heart failure: Secondary | ICD-10-CM

## 2017-09-05 DIAGNOSIS — Z794 Long term (current) use of insulin: Secondary | ICD-10-CM | POA: Diagnosis not present

## 2017-09-05 DIAGNOSIS — G459 Transient cerebral ischemic attack, unspecified: Secondary | ICD-10-CM

## 2017-09-05 DIAGNOSIS — E119 Type 2 diabetes mellitus without complications: Secondary | ICD-10-CM | POA: Diagnosis not present

## 2017-09-05 LAB — GLUCOSE, CAPILLARY
Glucose-Capillary: 95 mg/dL (ref 65–99)
Glucose-Capillary: 175 mg/dL — ABNORMAL HIGH (ref 65–99)

## 2017-09-05 LAB — HEMOGLOBIN A1C
Hgb A1c MFr Bld: 8.4 % — ABNORMAL HIGH (ref 4.8–5.6)
Mean Plasma Glucose: 194.38 mg/dL

## 2017-09-05 LAB — ECHOCARDIOGRAM COMPLETE
Height: 64 in
Weight: 2208 oz

## 2017-09-05 MED ORDER — INSULIN LISPRO 100 UNIT/ML (KWIKPEN): PEN_INJECTOR | SUBCUTANEOUS | 1 refills | Status: DC

## 2017-09-05 MED ORDER — EZETIMIBE 10 MG PO TABS
10.0000 mg | ORAL_TABLET | Freq: Every day | ORAL | Status: DC
  Administered 2017-09-05: 10 mg via ORAL
  Filled 2017-09-05: qty 1

## 2017-09-05 MED ORDER — ASPIRIN EC 81 MG PO TBEC
81.0000 mg | DELAYED_RELEASE_TABLET | Freq: Every day | ORAL | Status: DC
  Administered 2017-09-05: 81 mg via ORAL
  Filled 2017-09-05: qty 1

## 2017-09-05 MED ORDER — ASPIRIN 81 MG PO TBEC: 81.0000 mg | DELAYED_RELEASE_TABLET | Freq: Every day | ORAL | 0 refills | Status: DC

## 2017-09-05 MED ORDER — EZETIMIBE 10 MG PO TABS: 10.0000 mg | ORAL_TABLET | Freq: Every day | ORAL | 0 refills | Status: DC

## 2017-09-05 NOTE — Evaluation (Signed)
Speech Language Pathology Evaluation Patient Details Name: Rebecca Perkins MRN: [id number] DOB: [birth year] Today's Date: 09/05/2017 Time: 6629-4765 SLP Time Calculation (min) (ACUTE ONLY): 16 min  Problem List:  Patient Active Problem List   Diagnosis Date Noted  . HTN (hypertension) 09/04/2017  . GERD (gastroesophageal reflux disease) 09/04/2017  . Diabetes mellitus type 2, insulin dependent ([place]) 09/04/2017  . Anemia 09/04/2017  . HLD (hyperlipidemia) 09/04/2017  . CKD (chronic kidney disease) stage 3, GFR 30-59 ml/min (HCC) 09/04/2017  . Aphasia 09/01/2015  . TIA (transient ischemic attack) 06/17/2015  . Osteoporosis 02/18/2015  . Normocytic anemia 12/13/2013  . Diabetes mellitus type II, uncontrolled ([place]) 12/10/2013  . Pneumonia 12/10/2013  . Sepsis ([place]) 12/09/2013  . Type 2 diabetes mellitus with insulin deficiency ([place])   . Hypertension   . Hypertriglyceridemia   . Dyslipidemia    Past Medical History:  Past Medical History:  Diagnosis Date  . Anemia   . Diabetes mellitus type 2, insulin dependent ([place])   . Dyslipidemia   . GERD (gastroesophageal reflux disease)   . Hypertension   . Migraines   . Osteoporosis   . PVD (peripheral vascular disease) ([place])    abi .83 (L), .92 (R)  . RSD (reflex sympathetic dystrophy) 2007   R wrist/hand following fx  . TIA (transient ischemic attack)   . Varicose veins    Past Surgical History:  Past Surgical History:  Procedure Laterality Date  . ABDOMINAL HYSTERECTOMY  1988  . APPENDECTOMY  1966  . BREAST CYST EXCISION    . CATARACT EXTRACTION  10/2009  . CHOLECYSTECTOMY  1989  . Several benign cyst removed     last 1 in 1972  . TUBAL LIGATION    . UMBILICAL HERNIA REPAIR     HPI:  84 year old female admitted 09/04/17 with right side weakness and dysarthria. PMH significant for TIA 2016, DM , HTN, DLD, PVD, GERD, anemia. MRI = no acute CVA   Assessment / Plan / Recommendation Clinical Impression  Pt presents with  baseline speech, language and cognitive status. Dysarthric speech has resolved, and no deficits in communication or cognition are evident. Pt/son were encouraged to notify PCP if difficulties arise once pt is DC'd and returns to normal routine, as home health or outpatient evaluation may be beneficial for continued safety and independence. No further ST intervention is recommended at this time. Please reconsult if needs arise.    SLP Assessment  SLP Recommendation/Assessment: Patient does not need any further Speech Language Pathology Services SLP Visit Diagnosis: Cognitive communication deficit (R41.841)    Follow Up Recommendations  None       SLP Evaluation Cognition  Overall Cognitive Status: Within Functional Limits for tasks assessed Arousal/Alertness: Awake/alert Orientation Level: Oriented X4       Comprehension  Auditory Comprehension Overall Auditory Comprehension: Appears within functional limits for tasks assessed    Expression Expression Primary Mode of Expression: Verbal Verbal Expression Overall Verbal Expression: Appears within functional limits for tasks assessed Written Expression Dominant Hand: Right   Oral / Motor  Oral Motor/Sensory Function Overall Oral Motor/Sensory Function: Within functional limits Motor Speech Overall Motor Speech: Appears within functional limits for tasks assessed   GO                   Londen Lorge B. Quentin O[facility], CCC-SLP Speech Language Pathologist [phone]  Shonna Chock 09/05/2017, 11:34 AM

## 2017-09-05 NOTE — Evaluation (Addendum)
Occupational Therapy Evaluation Patient Details Name: Rebecca Perkins MRN: [id number] DOB: 08/04/1928 Today's Date: 09/05/2017    History of Present Illness 84 y.o. female admitted for R sided numbness, dropping things, and slurred speech.  MRI was negative for acute stroke. PMH includes TIA, DM, HTN, peripheral neuropathy, PVD, and osteoporosis.    Clinical Impression   This 84 y/o F presents with the above. At baseline Pt is independent with ADLs and functional mobility. Pt completing room level functional mobility, seated and standing ADLs with MinGuard throughout this session. Questions answered throughout and education provided regarding safety during functional task completion at home with Pt verbalizing understanding. Pt's son present and Pt/Pt's son both reporting feel Pt is at her baseline regarding ADL/functional mobility completion No further acute OT needs identified at this time. OT referral is appreciated, will sign off.     Follow Up Recommendations  No OT follow up;Supervision - Intermittent    Equipment Recommendations  None recommended by OT           Precautions / Restrictions Precautions Precautions: Fall Restrictions Weight Bearing Restrictions: No      Mobility Bed Mobility               General bed mobility comments: Pt up in recliner upon arrival   Transfers Overall transfer level: Independent Equipment used: None             General transfer comment: sit<>stand with supervision for safety, no physical assist needed; Pt stood from recliner and toilet     Balance Overall balance assessment: No apparent balance deficits (not formally assessed)                                         ADL either performed or assessed with clinical judgement   ADL Overall ADL's : At baseline                                       General ADL Comments: Pt completing room level functional mobility, toil transfer, LB and  standing grooming ADLs with MinGuard assist throughout; Pt reports she mostly sponge bathes at home, but able to describe safe use of grab bars when transferring into/out of tub; Pt's son present during session and Pt/Pt's son reporting feeling Pt is at her baseline regarding ADLs and functional mobility; discussion held and education provided re: safety and home and ways to decrease risk of falls with Pt/Pt's son verbalizing understanding      Vision Baseline Vision/History: Wears glasses Wears Glasses: Reading only Patient Visual Report: No change from baseline Vision Assessment?: No apparent visual deficits                Pertinent Vitals/Pain Pain Assessment: No/denies pain     Hand Dominance Right   Extremity/Trunk Assessment Upper Extremity Assessment Upper Extremity Assessment: Generalized weakness;RUE deficits/detail;LUE deficits/detail RUE Deficits / Details: Pt with limited AROM (approx 0-75*), Pt reporting this is due chronic shoulder issues  RUE Coordination: decreased fine motor LUE Coordination: decreased fine motor   Lower Extremity Assessment Lower Extremity Assessment: LLE deficits/detail;RLE deficits/detail RLE Deficits / Details: grossly 4/5, appears to be baseline RLE Sensation: history of peripheral neuropathy LLE Deficits / Details: grossly 4/5, appears to be baseline LLE Sensation: history of peripheral neuropathy  Communication Communication Communication: No difficulties   Cognition Arousal/Alertness: Awake/alert Behavior During Therapy: WFL for tasks assessed/performed Overall Cognitive Status: Within Functional Limits for tasks assessed                                     General Comments  Pt's son present during session                Home Living Family/patient expects to be discharged to:: Private residence Living Arrangements: Alone Available Help at Discharge: Family;Available 24 hours/day Type of Home:  House Home Access: Stairs to enter [employer] of Steps: 3 Entrance Stairs-Rails: Can reach both;Left;Right Home Layout: One level     Bathroom Shower/Tub: Teacher, early years/pre: Handicapped height Bathroom Accessibility: Yes   Home Equipment: Environmental consultant - 2 wheels;Cane - quad;Grab bars - toilet;Grab bars - tub/shower   Additional Comments: reading glasses  Lives With: Alone    Prior Functioning/Environment Level of Independence: Independent                 OT Problem List: Decreased activity tolerance;Decreased strength            OT Goals(Current goals can be found in the care plan section) Acute Rehab OT Goals Patient Stated Goal: return home  OT Goal Formulation: All assessment and education complete, DC therapy                                 AM-PAC PT "6 Clicks" Daily Activity     Outcome Measure Help from another person eating meals?: None Help from another person taking care of personal grooming?: None Help from another person toileting, which includes using toliet, bedpan, or urinal?: None Help from another person bathing (including washing, rinsing, drying)?: A Little Help from another person to put on and taking off regular upper body clothing?: None Help from another person to put on and taking off regular lower body clothing?: A Little 6 Click Score: 22   End of Session Equipment Utilized During Treatment: Gait belt Nurse Communication: Mobility status  Activity Tolerance: Patient tolerated treatment well Patient left: in chair;with call bell/phone within reach;with family/visitor present  OT Visit Diagnosis: Other symptoms and signs involving the nervous system (R29.898)                Time: 4801-6553 OT Time Calculation (min): 20 min Charges:  OT General Charges $OT Visit: 1 Visit OT Evaluation $OT Eval Low Complexity: 1 Low G-Codes:     Lou Cal, OT Pager [phone] 09/05/2017   Raymondo Band 09/05/2017, 1:56 PM

## 2017-09-05 NOTE — Discharge Summary (Signed)
Physician Discharge Summary  Rebecca Perkins UDJ:[id number] DOB: 1929-02-02 DOA: 09/04/2017  PCP: [name], MD  Admit date: 09/04/2017 Discharge date: 09/05/2017   Recommendations for Outpatient Follow-Up:   1. ASA 81 mg/plavix x 3 weeks then change to ASA 325 mg alone 2. Close outpatient neurology follow up 3. zetia added- may need new IV med for cholesterol   Discharge Diagnosis:   Principal Problem:   TIA (transient ischemic attack) Active Problems:   HTN (hypertension)   GERD (gastroesophageal reflux disease)   Diabetes mellitus type 2, insulin dependent (HCC)   Anemia   HLD (hyperlipidemia)   CKD (chronic kidney disease) stage 3, GFR 30-59 ml/min [facility])   Discharge disposition:  Home.  Discharge Condition: Improved.  Diet recommendation: Low sodium, heart healthy.  Carbohydrate-modified  Wound care: None.   History of Present Illness:   Rebecca Perkins is a 84 y.o. female with medical history significant for TIA in 2015, diabetes on insulin, hypertension, dyslipidemia with statin intolerance, mild peripheral vascular disease, reflux and anemia.  Patient presents to the ER today after developing right side numbness with slurred speech duration of about 45 minutes.  EMS was called to the home but upon their arrival symptoms had resolved.  Patient and family also report last Saturday patient had an episode of dysarthria with nonsensical speech that lasted a few minutes.  Patient reports that prior to onset of the symptoms she felt her CBGs were "too low" and after she ate some food the symptoms resolved.  CT of the head in the ER unremarkable.  Formal neurological consultation is pending.  Patient will be placed in observation status in regards to TIA evaluation.     Hospital Course by Problem:   TIA -small vessel disease -ASA 81 mg plus plavix for 3 weeks then ASA 325 mg alone zetia-- may need IV statin -neuro consult  HTN (hypertension) -Blood pressure  currently well controlled -Continue preadmission Cozaar and Lopressor    Diabetes mellitus type 2, insulin dependent -needs better control: HgbA1c: 8.4- patient to bring in log -Continue Toujeo -Takes Trulicity on Saturdays -Patient utilizes sliding scale Humalog insulin before a.m. meal if blood sugar over 150 -Outpatient endocrinologist Dr. Dwyane Dee    HLD (hyperlipidemia) -Statin intolerant as above -Continue omega-3 fatty acids    CKD (chronic kidney disease) stage 3, GFR 30-59 ml/min  -Renal function stable and at baseline -Avoid nephrotoxic medications    GERD (gastroesophageal reflux disease) -Continue PPI    Anemia -Continue multiple vitamin and oral iron replacement    Medical Consultants:   Neuro  Discharge Exam:   Vitals:   09/05/17 0939 09/05/17 1015  BP: 139/75   Pulse: (!) 58 64  Resp: 18   Temp: 97.9 F (36.6 C)   SpO2: 98%    Vitals:   09/05/17 0200 09/05/17 0453 09/05/17 0939 09/05/17 1015  BP: (!) 118/52 (!) 121/59 139/75   Pulse: 65 63 (!) 58 64  Resp: '18 18 18   '$ Temp: 98.8 F (37.1 C) 98 F (36.7 C) 97.9 F (36.6 C)   TempSrc: Oral Oral Oral   SpO2: 96% 95% 98%   Weight:      Height:        Gen:  NAD    The results of significant diagnostics from this hospitalization (including imaging, microbiology, ancillary and laboratory) are listed below for reference.     Procedures and Diagnostic Studies:   Ct Head Wo Contrast  Result Date: 09/04/2017 CLINICAL  DATA:  Right-sided numbness and slurred speech, now resolved. EXAM: CT HEAD WITHOUT CONTRAST TECHNIQUE: Contiguous axial images were obtained from the base of the skull through the vertex without intravenous contrast. COMPARISON:  Brain MRI 01/13/2017 FINDINGS: Brain: There is no evidence of acute infarct, intracranial hemorrhage, mass, midline shift, or extra-axial fluid collection. Cerebral atrophy is within normal limits for age. Patchy cerebral white matter hypodensities are  nonspecific but compatible with moderate chronic small vessel ischemic disease. A chronic infarct is again noted just anterior to the left basal ganglia. There also chronic lacunar infarcts in the thalami. Vascular: Calcified atherosclerosis at the skull base. No hyperdense vessel. Skull: No fracture or focal osseous lesion. Sinuses/Orbits: Visualized paranasal sinuses and mastoid air cells are clear. Bilateral cataract extraction is noted. Other: None. IMPRESSION: 1. No evidence of acute intracranial abnormality. 2. Moderate chronic small vessel ischemic disease. Electronically Signed   By: Logan Bores M.D.   On: 09/04/2017 10:10   Mr Brain Wo Contrast  Result Date: 09/04/2017 CLINICAL DATA:  RIGHT-sided numbness and slurred speech that began earlier today. Symptoms have resolved. Stroke risk factors include diabetes mellitus, hypertension, and previous stroke. EXAM: MRI HEAD WITHOUT CONTRAST MRA HEAD WITHOUT CONTRAST TECHNIQUE: Multiplanar, multiecho pulse sequences of the brain and surrounding structures were obtained without intravenous contrast. Angiographic images of the head were obtained using MRA technique without contrast. COMPARISON:  MR head 01/13/2017.  MR head 06/17/2015. FINDINGS: MRI HEAD FINDINGS Brain: No evidence for acute infarction, hemorrhage, mass lesion, hydrocephalus, or extra-axial fluid. Moderate cerebral and cerebellar atrophy. Advanced subcortical and periventricular T2 and FLAIR hyperintensities, likely chronic microvascular ischemic change. Chronic subcortical infarct, LEFT insula, approaches the basal ganglia. Tiny focus of chronic hemorrhage, LEFT thalamus. Vascular: Described below. Skull and upper cervical spine: Normal marrow signal. Sinuses/Orbits: Negative. Other: None. MRA HEAD FINDINGS The internal carotid arteries are dolichoectatic but widely patent no anterior circulation stenosis or occlusion of significance Basilar is supplied by both vertebrals, LEFT dominant. There  is focal narrowing in the mid basilar, with irregularity, 50-75% stenosis. 50% stenosis LEFT PCA P1/P2 junction. Severe stenosis P2/P3 segment, RIGHT PCA. No visible cerebellar branch occlusion. No saccular aneurysm. IMPRESSION: No acute stroke is evident. Advanced atrophy and small vessel disease, progressive from 2016. Posterior circulation intracranial atherosclerotic disease with BILATERAL PCA and basilar atherosclerotic changes, potentially flow reducing. Electronically Signed   By: Staci Righter M.D.   On: 09/04/2017 14:17   Mr Virgel Paling RC Contrast  Result Date: 09/04/2017 CLINICAL DATA:  RIGHT-sided numbness and slurred speech that began earlier today. Symptoms have resolved. Stroke risk factors include diabetes mellitus, hypertension, and previous stroke. EXAM: MRI HEAD WITHOUT CONTRAST MRA HEAD WITHOUT CONTRAST TECHNIQUE: Multiplanar, multiecho pulse sequences of the brain and surrounding structures were obtained without intravenous contrast. Angiographic images of the head were obtained using MRA technique without contrast. COMPARISON:  MR head 01/13/2017.  MR head 06/17/2015. FINDINGS: MRI HEAD FINDINGS Brain: No evidence for acute infarction, hemorrhage, mass lesion, hydrocephalus, or extra-axial fluid. Moderate cerebral and cerebellar atrophy. Advanced subcortical and periventricular T2 and FLAIR hyperintensities, likely chronic microvascular ischemic change. Chronic subcortical infarct, LEFT insula, approaches the basal ganglia. Tiny focus of chronic hemorrhage, LEFT thalamus. Vascular: Described below. Skull and upper cervical spine: Normal marrow signal. Sinuses/Orbits: Negative. Other: None. MRA HEAD FINDINGS The internal carotid arteries are dolichoectatic but widely patent no anterior circulation stenosis or occlusion of significance Basilar is supplied by both vertebrals, LEFT dominant. There is focal narrowing in the mid basilar,  with irregularity, 50-75% stenosis. 50% stenosis LEFT PCA P1/P2  junction. Severe stenosis P2/P3 segment, RIGHT PCA. No visible cerebellar branch occlusion. No saccular aneurysm. IMPRESSION: No acute stroke is evident. Advanced atrophy and small vessel disease, progressive from 2016. Posterior circulation intracranial atherosclerotic disease with BILATERAL PCA and basilar atherosclerotic changes, potentially flow reducing. Electronically Signed   By: Staci Righter M.D.   On: 09/04/2017 14:17   Vas US Carotid (at [place] Only)  Result Date: 09/05/2017 Carotid Arterial Duplex Study Indications:  TIA, Speech disturbance, Numbness and Weakness. Risk Factors: Hypertension, hyperlipidemia, insulin dependent diabetes mellitus,               PAD. Examination Guidelines: A complete evaluation includes B-mode imaging, spectral doppler, color doppler, and power doppler as needed of all accessible portions of each vessel. Bilateral testing is considered an integral part of a complete examination. Limited examinations for reoccurring indications may be performed as noted. Prior exam from 06/19/15 is available.  Right Carotid Findings: +----------+--------+--------+--------+--------+------------------+           PSV cm/sEDV cm/sStenosisDescribeComments           +----------+--------+--------+--------+--------+------------------+ CCA Prox  114     12                      intimal thickening +----------+--------+--------+--------+--------+------------------+ CCA Distal-93     -12                                        +----------+--------+--------+--------+--------+------------------+ ICA Prox  69      14              calcificShadowing          +----------+--------+--------+--------+--------+------------------+ ICA Distal-118    -17                                        +----------+--------+--------+--------+--------+------------------+ ECA       -65     -7                                          +----------+--------+--------+--------+--------+------------------+ +----------+--------+-------+--------+-------------------+           PSV cm/sEDV cmsDescribeArm Pressure (mmHG) +----------+--------+-------+--------+-------------------+ Subclavian114                                        +----------+--------+-------+--------+-------------------+ +---------+--------+---+--------+---+ VertebralPSV cm/s-93EDV cm/s-11 +---------+--------+---+--------+---+  Left Carotid Findings: +----------+--------+--------+--------+--------+------------------+           PSV cm/sEDV cm/sStenosisDescribeComments           +----------+--------+--------+--------+--------+------------------+ CCA Prox  -109    -15                     intimal thickening +----------+--------+--------+--------+--------+------------------+ CCA Distal-91     -16                                        +----------+--------+--------+--------+--------+------------------+ ICA Prox  -165    -27  calcific                   +----------+--------+--------+--------+--------+------------------+ ICA Distal-98     -22                                        +----------+--------+--------+--------+--------+------------------+ ECA       -134    -5                                         +----------+--------+--------+--------+--------+------------------+ +----------+--------+--------+--------+-------------------+ SubclavianPSV cm/sEDV cm/sDescribeArm Pressure (mmHG) +----------+--------+--------+--------+-------------------+           141                                         +----------+--------+--------+--------+-------------------+ +---------+--------+---+--------+---+ VertebralPSV cm/s-76EDV cm/s-14 +---------+--------+---+--------+---+  Final Interpretation: Right Carotid: Velocities in the right ICA are consistent with a 1-39% stenosis. Left Carotid: Velocities in the left ICA are  consistent with a 1-39% stenosis. Vertebrals:  Both vertebral arteries were patent with antegrade flow. Subclavians: Normal flow hemodynamics were seen in bilateral subclavian              arteries. *See table(s) above for measurements and observations.  Electronically signed by Antony Contras on 09/05/2017 at 12:30:22 PM.       Labs:   Basic Metabolic Panel: Recent Labs  Lab 09/02/17 0914 09/04/17 0922 09/04/17 1103  NA 144 139 145  K 4.5 3.8 3.9  CL 108 107 109  CO2 26 22  --   GLUCOSE 154* 152* 144*  BUN '15 15 18  '$ CREATININE 0.93* 1.03* 0.90  CALCIUM 9.8 9.0  --    GFR Estimated Creatinine Clearance: 38.7 mL/min (by C-G formula based on SCr of 0.9 mg/dL). Liver Function Tests: Recent Labs  Lab 09/02/17 0914 09/04/17 0922  AST 13 21  ALT 8 11*  ALKPHOS  --  86  BILITOT 0.3 0.3  PROT 7.1 6.5  ALBUMIN  --  3.1*   No results for input(s): LIPASE, AMYLASE in the last 168 hours. No results for input(s): AMMONIA in the last 168 hours. Coagulation profile Recent Labs  Lab 09/04/17 0922  INR 1.04    CBC: Recent Labs  Lab 09/02/17 0914 09/04/17 0922 09/04/17 1103  WBC 6.0 5.5  --   NEUTROABS 2,994 3.4  --   HGB 12.2 11.4* 11.2*  HCT 36.8 35.6* 33.0*  MCV 79.8* 84.6  --   PLT 223 183  --    Cardiac Enzymes: No results for input(s): CKTOTAL, CKMB, CKMBINDEX, TROPONINI in the last 168 hours. BNP: Invalid input(s): POCBNP CBG: Recent Labs  Lab 09/04/17 1825 09/04/17 2214 09/05/17 0624 09/05/17 1134  GLUCAP 215* 122* 95 175*   D-Dimer No results for input(s): DDIMER in the last 72 hours. Hgb A1c Recent Labs    09/05/17 0511  HGBA1C 8.4*   Lipid Profile No results for input(s): CHOL, HDL, LDLCALC, TRIG, CHOLHDL, LDLDIRECT in the last 72 hours. Thyroid function studies No results for input(s): TSH, T4TOTAL, T3FREE, THYROIDAB in the last 72 hours.  Invalid input(s): FREET3 Anemia work up No results for input(s): VITAMINB12, FOLATE, FERRITIN, TIBC,  IRON, RETICCTPCT in the last 72 hours. Microbiology No results found  for this or any previous visit (from the past 240 hour(s)).   Discharge Instructions:   Discharge Instructions    Ambulatory referral to Neurology   Complete by:  As directed    An appointment is requested in approximately: 6 weeks Follow up with stroke clinic (Dr Leonie Man preferred, if not available, then consider [facility] Chestnut, [facility] or Jaynee Eagles whoever is available) at [facility] in about 6-8 weeks. Thanks.   Diet - low sodium heart healthy   Complete by:  As directed    Diet Carb Modified   Complete by:  As directed    Discharge instructions   Complete by:  As directed    Asa/plavix x 3 weeks then ASA will be increased to 325 mg daily and plavix will be stopped   Increase activity slowly   Complete by:  As directed      Allergies as of 09/05/2017      Reactions   Lipitor [atorvastatin Calcium] Diarrhea   Niacin And Related Hives   Statins Diarrhea   Macrobid [nitrofurantoin Macrocrystal] Other (See Comments)   unknown      Medication List    STOP taking these medications   atorvastatin 40 MG tablet Commonly known as:  LIPITOR     TAKE these medications   aspirin 81 MG EC tablet Take 1 tablet (81 mg total) by mouth daily. Start taking on:  09/06/2017   clopidogrel 75 MG tablet Commonly known as:  PLAVIX TAKE 1 TABLET BY MOUTH DAILY   ezetimibe 10 MG tablet Commonly known as:  ZETIA Take 1 tablet (10 mg total) by mouth daily. Start taking on:  09/06/2017   ferrous sulfate 325 (65 FE) MG tablet Take 1 tablet (325 mg total) by mouth 3 (three) times daily with meals. What changed:  when to take this   gemfibrozil 600 MG tablet Commonly known as:  LOPID TAKE 1 TABLET BY MOUTH TWICE DAILY BEFORE A MEAL   insulin lispro 100 UNIT/ML KiwkPen Commonly known as:  HUMALOG KWIKPEN Use only if BS over 150 in am What changed:  additional instructions   Insulin Pen Needle 31G X 8 MM Misc Commonly known as:  B-D  ULTRAFINE III SHORT PEN USE AS DIRECTED TO INJECT TOUJEO DAILY   INSULIN SYRINGE 1CC/29G 29G X 1/2" 1 ML Misc AS DIRECTED.   losartan 100 MG tablet Commonly known as:  COZAAR TAKE 1 TABLET BY MOUTH EVERY DAY What changed:    how much to take  how to take this  when to take this   metoprolol tartrate 50 MG tablet Commonly known as:  LOPRESSOR TAKE 1/2 TABLET  BY MOUTH TWICE DAILY   multivitamin tablet Take 1 tablet by mouth daily.   omega-3 acid ethyl esters 1 g capsule Commonly known as:  LOVAZA TAKE 3 CAPSULES BY MOUTH DAILY   omeprazole 20 MG capsule Commonly known as:  PRILOSEC TAKE ONE CAPSULE BY MOUTH DAILY   ONE TOUCH ULTRA SYSTEM KIT w/Device Kit 1 kit by Does not apply route once. Tests Blood sugar before meals and at bedtime   ONE TOUCH ULTRA TEST test strip Generic drug:  glucose blood USE FOUR TIMES DAILY BEFORE MEALS AND EVERY NIGHT AT BEDTIME   pyridOXINE 100 MG tablet Commonly known as:  VITAMIN B-6 Take 100 mg by mouth daily.   TOUJEO MAX SOLOSTAR 300 UNIT/ML Sopn Generic drug:  Insulin Glargine Inject 40 Units into the skin every morning. What changed:  Another medication with the same name  was removed. Continue taking this medication, and follow the directions you see here.   TRULICITY 4.12 IN/8.6VE Sopn Generic drug:  Dulaglutide INJECT INTO THE ABDOMINAL SKIN ONCE WEEKLY      Follow-up Information    Garvin Fila, MD. Schedule an appointment as soon as possible for a visit in 6 week(s).   Specialties:  Neurology, Radiology Contact information: [address] [place] [address] [phone]        [name], MD Follow up in 1 week(s).   Specialty:  Family Medicine Contact information: [address] [place] [address] [phone]            Time coordinating discharge: 35 min  Signed:  Geradine Girt   [facility] 09/05/2017, 1:54 PM

## 2017-09-05 NOTE — Progress Notes (Signed)
VASCULAR LAB PRELIMINARY  PRELIMINARY  PRELIMINARY  PRELIMINARY  Carotid duplex completed.    Preliminary report:  1-39% ICA stenosis. Vertebral artery flow is antegrade.   Fynley Chrystal, RVT 09/05/2017, 8:50 AM

## 2017-09-05 NOTE — Progress Notes (Signed)
SLP Cancellation Note  Patient Details Name: Rebecca Perkins MRN: [id number] DOB: July 30, 1929   Cancelled treatment:       Reason Eval/Treat Not Completed: Patient at procedure or test/unavailable - pt currently with physical therapy. Will continue efforts.  Kelley Polinsky B. Quentin Ore [facility], CCC-SLP Speech Language Pathologist [phone]  Rebecca Perkins 09/05/2017, 10:05 AM

## 2017-09-05 NOTE — Progress Notes (Signed)
  Echocardiogram 2D Echocardiogram has been performed.  Jennette Dubin 09/05/2017, 9:28 AM

## 2017-09-05 NOTE — Evaluation (Signed)
Physical Therapy Evaluation Patient Details Name: Rebecca Perkins MRN: [id number] DOB: 15-Feb-1929 Today's Date: 09/05/2017   History of Present Illness  84 y.o. female admitted for R sided numbness, dropping things, and slurred speech.  MRI was negative for acute stroke. PMH includes TIA, DM, HTN, peripheral neuropathy, PVD, and osteoporosis.   Clinical Impression  Pt appears to be close to her baseline with no residual stroke-like symptoms and full cognition in tact. All transfers, ambulation, and stairs mobility mod I (increased time) to independent. Able to tolerate higher level balance activities without any unsteadiness or LOB. She says she has family close that is available 24/7.  Educated pt on s/sx of stroke. No further follow up form PT recommended at this time.     Follow Up Recommendations Supervision - Intermittent    Equipment Recommendations  None recommended by PT    Recommendations for Other Services       Precautions / Restrictions Precautions Precautions: Fall Restrictions Weight Bearing Restrictions: No      Mobility  Bed Mobility               General bed mobility comments: pt at EOB  Transfers Overall transfer level: Independent Equipment used: None             General transfer comment: sit<>stand independent  Ambulation/Gait Ambulation/Gait assistance: Modified independent (Device/Increase time) Ambulation Distance (Feet): 150 Feet Assistive device: None   Gait velocity: decreased, able to increase or decrease gait speed when asked to do so      Stairs Stairs: Yes Stairs assistance: Modified independent (Device/Increase time)(1 HR) Stair Management: One rail Right Number of Stairs: 3 General stair comments: step to pattern  Wheelchair Mobility    Modified Rankin (Stroke Patients Only) Modified Rankin (Stroke Patients Only) Pre-Morbid Rankin Score: No significant disability Modified Rankin: No symptoms     Balance Overall  balance assessment: No apparent balance deficits (not formally assessed)(last fall in 2007)                                           Pertinent Vitals/Pain Pain Assessment: No/denies pain    Home Living Family/patient expects to be discharged to:: Private residence Living Arrangements: Alone Available Help at Discharge: Family;Available 24 hours/day Type of Home: House Home Access: Stairs to enter Entrance Stairs-Rails: Can reach both;Left;Right Entrance Stairs-Number of Steps: 3 Home Layout: One level Home Equipment: Walker - 2 wheels;Cane - quad Additional Comments: reading glasses    Prior Function Level of Independence: Independent               Hand Dominance   Dominant Hand: Right    Extremity/Trunk Assessment   Upper Extremity Assessment Upper Extremity Assessment: Defer to OT evaluation    Lower Extremity Assessment Lower Extremity Assessment: LLE deficits/detail;RLE deficits/detail RLE Deficits / Details: grossly 4/5, appears to be baseline RLE Sensation: history of peripheral neuropathy LLE Deficits / Details: grossly 4/5, appears to be baseline LLE Sensation: history of peripheral neuropathy       Communication   Communication: No difficulties  Cognition Arousal/Alertness: Awake/alert Behavior During Therapy: WFL for tasks assessed/performed Overall Cognitive Status: Within Functional Limits for tasks assessed  General Comments General comments (skin integrity, edema, etc.): able to tolerate higher level balance tasks (head turns) without unsteadiness or LOB.    Exercises     Assessment/Plan    PT Assessment Patent does not need any further PT services  PT Problem List         PT Treatment Interventions      PT Goals (Current goals can be found in the Care Plan section)       Frequency     Barriers to discharge        Co-evaluation                AM-PAC PT "6 Clicks" Daily Activity  Outcome Measure Difficulty turning over in bed (including adjusting bedclothes, sheets and blankets)?: None Difficulty moving from lying on back to sitting on the side of the bed? : None Difficulty sitting down on and standing up from a chair with arms (e.g., wheelchair, bedside commode, etc,.)?: None Help needed moving to and from a bed to chair (including a wheelchair)?: None Help needed walking in hospital room?: None Help needed climbing 3-5 steps with a railing? : None 6 Click Score: 24    End of Session Equipment Utilized During Treatment: Gait belt Activity Tolerance: Patient tolerated treatment well(complained of some discomfort in R knee (chronic)) Patient left: in chair;with call bell/phone within reach;with nursing/sitter in room Nurse Communication: Mobility status PT Visit Diagnosis: Other abnormalities of gait and mobility (R26.89)    Time: 5364-6803 PT Time Calculation (min) (ACUTE ONLY): 17 min   Charges:   PT Evaluation $PT Eval Low Complexity: 1 Low     PT G Codes:       Vic Ripper, SPT   Vic Ripper 09/05/2017, 1:07 PM

## 2017-09-05 NOTE — Progress Notes (Signed)
NEUROHOSPITALISTS STROKE TEAM - DAILY PROGRESS NOTE   ADMISSION HISTORY: Rebecca Perkins is an 84 y.o. female with past medical history of a TIA in 2016- started on Plavix at that time, uncontrolled diabetes mellitus on insulin, orbital migraines, hypertension, familial dyslipidemia with statin intolerance, mild peripheral vascular neuropathy, anemia who was brought into the ED today by EMS due to acute right arm weakness and  expressive aphasia.  Of note patient has a history of TIA, initial one was 2016 with expressive aphasia that lasted for about 20 minutes, she had one subsequent episode that after, and one episode last week Friday, January 25, where she had a called her daughter-in-law but had expressive aphasia and could not make out the words that she wanted and had nonsensical speech.  This episode lasted for about 20 minutes.  She thought she had those episodes because of hypoglycemia.  Before this episode occurred she had checked her blood sugar was 90 and it was checked by EMS upon arrival and was 120 .patient also has a history of migraines and orbital area with aura of blurred vision, but symptoms resolved with Tylenol.  Patient states she averages 2-3 migraines a year and had a migraine preceding the episode last week Friday. This morning around 9:00 patient was eating her breakfast and noted right hand numbness, tingling, weakness -she was unable to even hold the fork up.  She had an episode of dysarthria with expressive aphasia -when she could not find the right words and all the symptoms spontaneously resolved in about 40 minutes.  The only difference between this episode and her previous episodes of TIA was the right arm weakness.    On admission to the ED initial CT of the head was unremarkable.  Patient had had a physical with her PCP, and informed him of her symptoms of TIA last week.  At baseline patient is on gemfibrozil and  Plavix. He was concerned and started patient on aspirin 81 mg as well as Lipitor for secondary stroke prevention.  Patient states she is unable to take Lipitor/Crestor or any statin secondary to myopathy.  Patient denies any recent viral illnesses, dizziness, ocular pressure or pain, chest pain, shortness of breath, fevers, chills, nausea, vomiting or unsteady gait.  Date last known well: 09/04/17 Time last known well: 0900 tPA Given: No, symptoms had resolved NIHSS: 0 Baseline MRS: 0  SUBJECTIVE (INTERVAL HISTORY) No family is at the bedside. Patient is found laying in bed in NAD. Overall she feels her condition is completely resolved. Voices no new complaints. No new events reported overnight. Patient recently returned from testing and is asking if she may go home today. She states she lives alone but her two sons are very supportive and live close by.   OBJECTIVE Lab Results: CBC:  Recent Labs  Lab 09/02/17 0914 09/04/17 0922 09/04/17 1103  WBC 6.0 5.5  --   HGB 12.2 11.4* 11.2*  HCT 36.8 35.6* 33.0*  MCV 79.8* 84.6  --   PLT 223 183  --    BMP: Recent Labs  Lab 09/02/17 0914 09/04/17 0922 09/04/17 1103  NA 144 139 145  K 4.5 3.8 3.9  CL 108 107 109  CO2 26 22  --   GLUCOSE 154* 152* 144*  BUN 15 15 18   CREATININE 0.93* 1.03* 0.90  CALCIUM 9.8 9.0  --    Liver Function Tests:  Recent Labs  Lab 09/02/17 0914 09/04/17 0922  AST 13 21  ALT 8  11*  ALKPHOS  --  86  BILITOT 0.3 0.3  PROT 7.1 6.5  ALBUMIN  --  3.1*   Coagulation Studies:  Recent Labs    09/04/17 0922  APTT 26  INR 1.04   PHYSICAL EXAM Temp:  [97.8 F (36.6 C)-98.8 F (37.1 C)] 97.9 F (36.6 C) (02/04 0939) Pulse Rate:  [58-73] 64 (02/04 1015) Resp:  [12-18] 18 (02/04 0939) BP: (118-179)/(52-91) 139/75 (02/04 0939) SpO2:  [94 %-100 %] 98 % (02/04 0939) General - Well nourished, well developed, in no apparent distress HEENT-  Normocephalic, Cardiovascular - Regular rate and rhythm    Respiratory - Lungs clear bilaterally. No wheezing. Abdomen - soft and non-tender, BS normal Extremities- no edema or cyanosis  Neurological Examination Mental Status: Alert, oriented, thought content appropriate.  Speech fluent without evidence of aphasia. Able to follow 3 step commands without difficulty. Cranial Nerves: II: Visual fields grossly normal,  III,IV, VI: ptosis not present, extra-ocular motions intact bilaterally, pupils equal, round, reactive to light and accommodation V,VII: smile symmetric, facial light touch sensation normal bilaterally VIII: hearing normal to voice IX,X: uvula rises symmetrically XI: bilateral shoulder shrug XII: midline tongue extension Motor: Right : Upper extremity   5/5    Left:     Upper extremity   5/5  Lower extremity   5/5     Lower extremity   5/5 Tone and bulk:normal tone throughout; no atrophy noted Sensory: Pinprick and light touch intact throughout, bilaterally, noted neuropathy under the sole of her feet Deep Tendon Reflexes: Muted Plantars: Right: downgoing   Left: downgoing Cerebellar: normal finger-to-nose, normal rapid alternating movements and normal heel-to-shin test Gait: normal gait and station  IMAGING: I have personally reviewed the radiological images below and agree with the radiology interpretations. Ct Head Wo Contrast  Result Date: 09/04/2017 CLINICAL DATA:  Right-sided numbness and slurred speech, now resolved. EXAM: CT HEAD WITHOUT CONTRAST TECHNIQUE: Contiguous axial images were obtained from the base of the skull through the vertex without intravenous contrast. COMPARISON:  Brain MRI 01/13/2017 FINDINGS: Brain: There is no evidence of acute infarct, intracranial hemorrhage, mass, midline shift, or extra-axial fluid collection. Cerebral atrophy is within normal limits for age. Patchy cerebral white matter hypodensities are nonspecific but compatible with moderate chronic small vessel ischemic disease. A chronic  infarct is again noted just anterior to the left basal ganglia. There also chronic lacunar infarcts in the thalami. Vascular: Calcified atherosclerosis at the skull base. No hyperdense vessel. Skull: No fracture or focal osseous lesion. Sinuses/Orbits: Visualized paranasal sinuses and mastoid air cells are clear. Bilateral cataract extraction is noted. Other: None. IMPRESSION: 1. No evidence of acute intracranial abnormality. 2. Moderate chronic small vessel ischemic disease. Electronically Signed   By: Logan Bores M.D.   On: 09/04/2017 10:10   Mr Brain Wo Contrast  Result Date: 09/04/2017 CLINICAL DATA:  RIGHT-sided numbness and slurred speech that began earlier today. Symptoms have resolved. Stroke risk factors include diabetes mellitus, hypertension, and previous stroke. EXAM: MRI HEAD WITHOUT CONTRAST MRA HEAD WITHOUT CONTRAST TECHNIQUE: Multiplanar, multiecho pulse sequences of the brain and surrounding structures were obtained without intravenous contrast. Angiographic images of the head were obtained using MRA technique without contrast. COMPARISON:  MR head 01/13/2017.  MR head 06/17/2015. FINDINGS: MRI HEAD FINDINGS Brain: No evidence for acute infarction, hemorrhage, mass lesion, hydrocephalus, or extra-axial fluid. Moderate cerebral and cerebellar atrophy. Advanced subcortical and periventricular T2 and FLAIR hyperintensities, likely chronic microvascular ischemic change. Chronic subcortical infarct, LEFT insula, approaches  the basal ganglia. Tiny focus of chronic hemorrhage, LEFT thalamus. Vascular: Described below. Skull and upper cervical spine: Normal marrow signal. Sinuses/Orbits: Negative. Other: None. MRA HEAD FINDINGS The internal carotid arteries are dolichoectatic but widely patent no anterior circulation stenosis or occlusion of significance Basilar is supplied by both vertebrals, LEFT dominant. There is focal narrowing in the mid basilar, with irregularity, 50-75% stenosis. 50% stenosis  LEFT PCA P1/P2 junction. Severe stenosis P2/P3 segment, RIGHT PCA. No visible cerebellar branch occlusion. No saccular aneurysm. IMPRESSION: No acute stroke is evident. Advanced atrophy and small vessel disease, progressive from 2016. Posterior circulation intracranial atherosclerotic disease with BILATERAL PCA and basilar atherosclerotic changes, potentially flow reducing. Electronically Signed   By: Staci Righter M.D.   On: 09/04/2017 14:17   Mr Virgel Paling HE Contrast  Result Date: 09/04/2017 CLINICAL DATA:  RIGHT-sided numbness and slurred speech that began earlier today. Symptoms have resolved. Stroke risk factors include diabetes mellitus, hypertension, and previous stroke. EXAM: MRI HEAD WITHOUT CONTRAST MRA HEAD WITHOUT CONTRAST TECHNIQUE: Multiplanar, multiecho pulse sequences of the brain and surrounding structures were obtained without intravenous contrast. Angiographic images of the head were obtained using MRA technique without contrast. COMPARISON:  MR head 01/13/2017.  MR head 06/17/2015. FINDINGS: MRI HEAD FINDINGS Brain: No evidence for acute infarction, hemorrhage, mass lesion, hydrocephalus, or extra-axial fluid. Moderate cerebral and cerebellar atrophy. Advanced subcortical and periventricular T2 and FLAIR hyperintensities, likely chronic microvascular ischemic change. Chronic subcortical infarct, LEFT insula, approaches the basal ganglia. Tiny focus of chronic hemorrhage, LEFT thalamus. Vascular: Described below. Skull and upper cervical spine: Normal marrow signal. Sinuses/Orbits: Negative. Other: None. MRA HEAD FINDINGS The internal carotid arteries are dolichoectatic but widely patent no anterior circulation stenosis or occlusion of significance Basilar is supplied by both vertebrals, LEFT dominant. There is focal narrowing in the mid basilar, with irregularity, 50-75% stenosis. 50% stenosis LEFT PCA P1/P2 junction. Severe stenosis P2/P3 segment, RIGHT PCA. No visible cerebellar branch  occlusion. No saccular aneurysm. IMPRESSION: No acute stroke is evident. Advanced atrophy and small vessel disease, progressive from 2016. Posterior circulation intracranial atherosclerotic disease with BILATERAL PCA and basilar atherosclerotic changes, potentially flow reducing. Electronically Signed   By: Staci Righter M.D.   On: 09/04/2017 14:17   Echocardiogram:                                               Study Conclusions - Left ventricle: The cavity size was normal. Wall thickness was   normal. Systolic function was normal. The estimated ejection   fraction was in the range of 60% to 65%. Wall motion was normal;   there were no regional wall motion abnormalities. Features are   consistent with a pseudonormal left ventricular filling pattern,   with concomitant abnormal relaxation and increased filling   pressure (grade 2 diastolic dysfunction). - Aortic valve: There was trivial regurgitation. - Mitral valve: Mildly calcified annulus.  B/L Carotid U/S:      1-39% ICA stenosis. Vertebral artery flow is antegrade.                                        IMPRESSION: Rebecca Perkins is a 84 y.o. female with PMH of a TIA in 2016- started on Plavix at that time, uncontrolled  diabetes mellitus on insulin, orbital migraines, hypertension, familial dyslipidemia with statin intolerance, mild peripheral vascular neuropathy, anemia who was brought into the ED today by EMS due to acute right arm weakness and  expressive aphasia.  Symptoms spontaneously resolved in about 45 minutes and on presentation to the ER symptoms had resolved. MRI reveals no acute stroke findings  Likely (subcortical TIA  Preceding episode of week ago with transient speech difficulties followed by headache, beginning migraine versus TIA Suspected Etiology: small vessel disease Resultant Symptoms: Right hand weakness/numb, expressive Aphasia Stroke Risk Factors: diabetes mellitus, hyperlipidemia and hypertension Other  Stroke Risk Factors: Advanced age, Hx stroke, Migraines  Outstanding Stroke Work-up Studies:    Work up completed at this time  09/05/2017 ASSESSMENT:   Neuro deficits are completely resolved. Patient explains that she took Lipitor for 6 months but stopped due to very bad cramps and leg weakness. She tried Crestor for a few weeks but stopped due to diarrhea. She has been taking her Lovaza and CoQ10 every day as Rx  PLAN  09/05/2017: Continue Aspirin/Plavix/Zetia/Lovaza/CoQ10 Continue ASA 81 mg and Plavix for 3 weeks, then discontinue Plavix and change ASA to 325 mg daily alone Frequent neuro checks Telemetry monitoring PT/OT/SLP Ongoing aggressive stroke risk factor management Patient counseled to be compliant with her antithrombotic medications Patient counseled on Lifestyle modifications including, Diet, Exercise, and Stress Follow up with [facility] in 6 weeks  HYPERTENSION: Stable Long term BP goal normotensive. May slowly restart home B/P medications  Home Meds: Lopressor  HYPERLIPIDEMIA:    Component Value Date/Time   CHOL 213 (H) 09/02/2017 0914   TRIG 213 (H) 09/02/2017 0914   HDL 29 (L) 09/02/2017 0914   CHOLHDL 7.3 (H) 09/02/2017 0914   VLDL NOT CALC 08/27/2016 1036   LDLCALC NOT CALC 08/27/2016 1036  Home Meds:  Lovasa LDL  goal < 70 Patient has long standing history of statin-induced myopathy after trying Lipitor and Crestor with side effects of muscle cramps/weakness and diarrhea. Patient is Statin intolerant - and will need PCSK9 Inhibitors - Repatha or Praluent, will start at follow up Neurology appointment Zetia 10 mg added, Continue Lovaza and CoQ10 200 mg daily.  DIABETES: Lab Results  Component Value Date   HGBA1C 8.4 (H) 09/05/2017  HgbA1c goal < 7.0 Currently on: Novolog Continue CBG monitoring and SSI to maintain glucose 140-180 mg/dl DM education   Other Active Problems: Principal Problem:   TIA (transient ischemic attack) Active  Problems:   HTN (hypertension)   GERD (gastroesophageal reflux disease)   Diabetes mellitus type 2, insulin dependent (HCC)   Anemia   HLD (hyperlipidemia)   CKD (chronic kidney disease) stage 3, GFR 30-59 ml/min [facility])    Hospital day # 0 VTE prophylaxis: Lovenox Diet : Fall precautions Diet heart healthy/carb modified Room service appropriate? Yes; Fluid consistency: Thin   FAMILY UPDATES:  No family at bedside  TEAM UPDATES: Geradine Girt, DO   Prior Home Stroke Medications: clopidogrel 75 mg daily  Discharge Stroke Meds:  Please discharge patient on clopidogrel 75 mg daily and ASA 81 mg daily x 3 weeks THEN discontinue Plavix and Rx ASA 325 mg daily alone  Disposition: 01-Home or Self Care Therapy Recs:               HOME Follow Up:  Follow-up Information    Garvin Fila, MD. Schedule an appointment as soon as possible for a visit in 6 week(s).   Specialties:  Neurology, Radiology Contact  information: [address] [place] [address] [phone]          [name], MD -PCP Follow up in 1-2 weeks     Assessment & plan discussed with with attending physician and they are in agreement.    Mary Sella, ANP-C Stroke Neurology Team 09/05/2017 12:41 PM I have personally examined this patient, reviewed notes, independently viewed imaging studies, participated in medical decision making and plan of care.ROS completed by me personally and pertinent positives fully documented  I have made any additions or clarifications directly to the above note. Agree with note above . She presented with a left hemispheric TIA likely due to small vessel disease and has prior history of TIAs as well as complicated migraine episodes. She has history of statin intolerance and will likely need to be started on the new PCS 9 inhibitors as an outpatient. Continue dual antiplatelet therapy for 3 weeks followed by aspirin 325 mg alone. Greater than 50% time during the  study foramina to visit was spent on counseling and coordination of care about her TIAs and statin intolerance and answering questions. Discussed with Dr. Theresa Duty, MD Medical Director [place] Pager: [phone] 09/05/2017 1:22 PM  Neurology to sign off. Please call with any further questions or concerns. Thank you for this consultation.  To contact Stroke Continuity provider, please refer to [email or web address] After hours, contact General Neurology

## 2017-09-05 NOTE — Progress Notes (Signed)
Discharge instructions given. Pt verbalized understanding and all questions were answered.  

## 2017-09-06 ENCOUNTER — Ambulatory Visit (HOSPITAL_COMMUNITY): Payer: Medicare Other

## 2017-09-06 ENCOUNTER — Telehealth: Payer: Self-pay | Admitting: Endocrinology

## 2017-09-06 ENCOUNTER — Other Ambulatory Visit: Payer: Self-pay | Admitting: Endocrinology

## 2017-09-06 ENCOUNTER — Other Ambulatory Visit: Payer: Self-pay

## 2017-09-06 ENCOUNTER — Telehealth: Payer: Self-pay

## 2017-09-06 MED ORDER — INSULIN LISPRO 100 UNIT/ML (KWIKPEN): PEN_INJECTOR | SUBCUTANEOUS | 2 refills | Status: DC

## 2017-09-06 NOTE — Telephone Encounter (Signed)
I have sent this in for patient and called to let her and her son know and documented in a previous note. She does not need this prescription at this time since they found 4 pens that are good through 2020.

## 2017-09-06 NOTE — Telephone Encounter (Signed)
Called patient and let her know that I have sent over her Humalog to the [facility]. Her son spoke to me and he stated that he found 4 pens that she had and they are good until 2020 and she will wait to pick up her new prescription at the pharmacy. She also stated that she had a mini stroke on Sunday.

## 2017-09-06 NOTE — Telephone Encounter (Signed)
Patient's son called [place] and he stated that Ms. Rebecca Perkins had a blood glucose reading of 340 and she injected 4 units of Humalog and 2 hours later her blood glucose reading was 382 and I advised that when he gives another injection to make sure she injects into a different site such as the back of her arm, top or outer side of her thigh, or her side instead of her normal injections site on her stomach so that she can have better absorption and also to keep a record of her BS readings and to call us if they continue to stay high. Her son also stated that she forgot to take her Trulicity on Sunday and that she will resume this on Sunday.

## 2017-09-06 NOTE — Telephone Encounter (Signed)
Patient was in hospital-needs new RX for Humalog sent to [facility] on [address] asap-someone is picking it up at lunch time

## 2017-09-09 ENCOUNTER — Other Ambulatory Visit: Payer: Self-pay | Admitting: Endocrinology

## 2017-09-09 ENCOUNTER — Encounter: Payer: Self-pay | Admitting: Family Medicine

## 2017-09-09 ENCOUNTER — Ambulatory Visit: Payer: Medicare Other | Admitting: Family Medicine

## 2017-09-09 VITALS — BP 122/60 | HR 60 | Temp 97.7°F | Resp 14 | Ht 64.0 in | Wt 139.0 lb

## 2017-09-09 DIAGNOSIS — Z8673 Personal history of transient ischemic attack (TIA), and cerebral infarction without residual deficits: Secondary | ICD-10-CM

## 2017-09-09 DIAGNOSIS — G459 Transient cerebral ischemic attack, unspecified: Secondary | ICD-10-CM | POA: Diagnosis not present

## 2017-09-09 DIAGNOSIS — E119 Type 2 diabetes mellitus without complications: Secondary | ICD-10-CM | POA: Diagnosis not present

## 2017-09-09 DIAGNOSIS — E781 Pure hyperglyceridemia: Secondary | ICD-10-CM

## 2017-09-09 MED ORDER — ROSUVASTATIN CALCIUM 20 MG PO TABS: 20.0000 mg | ORAL_TABLET | Freq: Every day | ORAL | 3 refills | Status: DC

## 2017-09-09 NOTE — Progress Notes (Signed)
Subjective:    Patient ID: Rebecca Perkins, female    DOB: 1929-03-17, 84 y.o.   MRN: [id number]  HPI  01/21/17 The patient last had lab work in our office in January and at that time her creatinine was 0.89. Her calculated glomerular filtration rate was approximate 60 mL of blood per minute. Last week, her endocrinologist recheck her creatinine and found to be 1.55 with a GFR of 30 mL of blood per minute. Repeat BMP revealed a creatinine of 1.37 with a GFR of 41 mL of blood per minute. She is here today to discuss. Patient is an 84 year old Caucasian female with a long-standing history of hypertriglyceridemia and insulin-dependent diabetes mellitus that has been poorly controlled. She is not taking any type of NSAID. However she is not drinking enough fluid. Recently she was found to be orthostatic and hypotensive causing her dizziness upon standing. She is also taking Lasix due to swelling in her legs. She denies any symptoms of a bladder infection.  At that time, my plan was:  Multi-factorial. Suspect related to age, chronic medical comorbidities, uncontrolled diabetes mellitus, dehydration, and diuretic use. I've asked the patient to discontinue her use of Lasix.  Recheck BMP in 1 week.  Obtain renal US and UA.   02/04/17 Urinalysis obtained at that office visit suggested a serious infection and the patient was treated with antibiotics. She states that by the time she finished antibiotics she felt much better. The dizziness upon standing had essentially resolved. She was no longer orthostatic. She's been trying to drink more fluid. Renal ultrasound was unremarkable. There is no hydronephrosis. There is no mass. She is here today to recheck her urine sample and also to recheck her renal function.  At that time, my plan was:  Symptomatically, the patient feels much better. Orthostatic dizziness has resolved. She appears more hydrated today. I will repeat a urinalysis to ensure resolution of her  urinary tract infection. I will also repeat her renal function as I anticipated it would be better and back to her baseline. Renal ultrasound was reassuring. If worsening, consider an SPEP or UPEP  09/02/17 Patient is here today for a complete physical exam.  She is seeing endocrinology for management of her type 2 diabetes mellitus.  Her last hemoglobin A1c was 7.9 in November.  She is here today for fasting lab work.  Her immunization records are below: Immunization History  Administered Date(s) Administered  . Influenza Split 05/05/2011  . Influenza, High Dose Seasonal PF 05/03/2015, 04/30/2016  . Influenza,inj,Quad PF,6+ Mos 04/25/2014  . Influenza-Unspecified 05/06/2012, 04/02/2013, 04/23/2016  . Pneumococcal Conjugate-13 08/13/2013  . Pneumococcal Polysaccharide-23 08/30/2016  . Tdap 12/15/2016  . Zoster Recombinat (Shingrix) 10/16/2016, 12/15/2016   Due to age, I have recommended against cancer screening including mammograms, Pap smear, or colonoscopy.  Patient's last bone density test was in 2016.  At that time her T score in her left hip was -2.6 qualifying the patient for osteoporosis.  She is on no treatment for osteoporosis.  She is not taking calcium or vitamin D.  Also there is a lesion on her lower left earlobe.  It is approximately 2 cm x 1 cm.  It is erythematous, bleeding, with scale.  It is concerning for a possible skin malignancy.  She is apparently seeing cardiology for this in the past.  I have recommended repeat follow-up for possible biopsy as I am concerned that this may represent a skin cancer.  She is accompanied today by her  son.  Her son reports possible TIA-like symptoms.  He states that the patient recently had an episode of word finding aphasia accompanied with slurred speech.  Symptoms resolved after 10-15 minutes.  Patient did not check her sugar during this event and therefore we are uncertain if this may have been hypoglycemia.  However she has previously had TIAs in  the past and is currently on Plavix for secondary prevention of stroke.  She is not on a statin due to her inability to tolerate statins because of diarrhea.  She is not taking aspirin.  Her last carotid Dopplers were in 2016 and at that time revealed 1-39% carotid artery stenosis.  Echocardiogram at that time revealed no cardioembolic source of stroke.  On exam today, her neurologic exam is completely normal.  At that time, my plan was:  Patient's physical exam is concerning first for the atypical lesion on her right ear.  I will schedule her a follow-up with her dermatologist as I am concerned that this may be an underlying skin cancer.  I am also concerned by her untreated osteoporosis.  I recommended 1000 mg a day of calcium and 1000 units a day of vitamin D.  I will also schedule the patient for prolia.  However my most concerning finding is her history of possible TIA like symptoms.  I am unable to determine if episode was due to hypoglycemia or if she was experiencing a TIA.  However I will go ahead and err on the side of caution.  I recommended adding aspirin 81 mg a day to the Plavix 75 mg she is taking.  I will obtain carotid Dopplers to rule out critical carotid artery stenosis.  I will obtain echocardiogram to rule out cardioembolic sources of stroke.  I recommended adding statin Lipitor 40 mg a day for secondary prevention of stroke despite her history of GI intolerance.  I am not comfortable with Lipitor coupled with gemfibrozil.  Therefore depending upon the results of her lipid panel, we may discontinue the gemfibrozil and switch the patient to Vascepa which has better outcomes data in this particular situation.  I also strongly recommended to the patient if symptoms happen again she call 911 immediately as time is of the essence and then also check her sugars to see if this could be hypoglycemia.  09/09/17 Patient was admitted to the hospital the Sunday after her physical having only taken 1 dose  of aspirin and having not yet started Lipitor with strokelike symptoms.  I reviewed her discharge summary including her carotid Dopplers, her echocardiogram results, and MRA of her brain.  Tentative plan at discharge was to continue aspirin 81 mg a day with Plavix 75 mg a day for 3 weeks and then to resume aspirin 325 mg a day thereafter.  They also added Zetia to her gemfibrozil in an effort to lower her risk of stroke secondary to microvascular sources.  Tentative plan was to try to pursue repatha/praluent and neurology follow-up.  Patient is here today for follow-up to discuss her hospitalization.  Spent more than 30 minutes with the patient reviewing her discharge summary and discussing her risk of stroke and the causes of stroke. Past Medical History:  Diagnosis Date  . Anemia   . Diabetes mellitus type 2, insulin dependent ([place])   . Dyslipidemia   . GERD (gastroesophageal reflux disease)   . Hypertension   . Migraines   . Osteoporosis   . PVD (peripheral vascular disease) ([place])    abi .  83 (L), .92 (R)  . RSD (reflex sympathetic dystrophy) 2007   R wrist/hand following fx  . TIA (transient ischemic attack)   . Varicose veins    Past Surgical History:  Procedure Laterality Date  . ABDOMINAL HYSTERECTOMY  1988  . APPENDECTOMY  1966  . BREAST CYST EXCISION    . CATARACT EXTRACTION  10/2009  . CHOLECYSTECTOMY  1989  . Several benign cyst removed     last 1 in 1972  . TUBAL LIGATION    . UMBILICAL HERNIA REPAIR     Current Outpatient Medications on File Prior to Visit  Medication Sig Dispense Refill  . aspirin EC 81 MG EC tablet Take 1 tablet (81 mg total) by mouth daily. 30 tablet 0  . Blood Glucose Monitoring Suppl (ONE TOUCH ULTRA SYSTEM KIT) W/DEVICE KIT 1 kit by Does not apply route once. Tests Blood sugar before meals and at bedtime 1 each 0  . clopidogrel (PLAVIX) 75 MG tablet TAKE 1 TABLET BY MOUTH DAILY 30 tablet 11  . ferrous sulfate 325 (65 FE) MG tablet Take 1 tablet (325  mg total) by mouth 3 (three) times daily with meals. (Patient taking differently: Take 325 mg by mouth 2 (two) times daily with a meal. ) 90 tablet 4  . Insulin Glargine (TOUJEO MAX SOLOSTAR) 300 UNIT/ML SOPN Inject 40 Units into the skin every morning.    . insulin lispro (HUMALOG KWIKPEN) 100 UNIT/ML KiwkPen Use only if BS over 150 in am 15 mL 1  . insulin lispro (HUMALOG KWIKPEN) 100 UNIT/ML KiwkPen INJECT 6 TO 10 UNITS AT MEAL TIMES 15 mL 2  . Insulin Pen Needle (B-D ULTRAFINE III SHORT PEN) 31G X 8 MM MISC USE AS DIRECTED TO INJECT TOUJEO DAILY 100 each 5  . INSULIN SYRINGE 1CC/29G 29G X 1/2" 1 ML MISC AS DIRECTED. 100 each 3  . losartan (COZAAR) 100 MG tablet TAKE 1 TABLET BY MOUTH EVERY DAY (Patient taking differently: TAKE 1/2 TABLET BY MOUTH EVERY DAY) 30 tablet 11  . metoprolol (LOPRESSOR) 50 MG tablet TAKE 1/2 TABLET  BY MOUTH TWICE DAILY 180 tablet 0  . Multiple Vitamin (MULTIVITAMIN) tablet Take 1 tablet by mouth daily.      Marland Kitchen omega-3 acid ethyl esters (LOVAZA) 1 g capsule TAKE 3 CAPSULES BY MOUTH DAILY 60 capsule 0  . omeprazole (PRILOSEC) 20 MG capsule TAKE ONE CAPSULE BY MOUTH DAILY 90 capsule 3  . ONE TOUCH ULTRA TEST test strip USE FOUR TIMES DAILY BEFORE MEALS AND EVERY NIGHT AT BEDTIME 150 each 5  . Pyridoxine HCl (VITAMIN B-6) 100 MG tablet Take 100 mg by mouth daily.      . TRULICITY 4.13 KG/4.0NU SOPN INJECT INTO THE ABDOMINAL SKIN ONCE WEEKLY 2 mL 3   No current facility-administered medications on file prior to visit.    Allergies  Allergen Reactions  . Lipitor [Atorvastatin Calcium] Diarrhea  . Niacin And Related Hives  . Statins Diarrhea  . Macrobid [Nitrofurantoin Macrocrystal] Other (See Comments)    unknown   Social History   Socioeconomic History  . Marital status: Widowed    Spouse name: Not on file  . Number of children: 3  . Years of education: Not on file  . Highest education level: Not on file  Social Needs  . Financial resource strain: Not on  file  . Food insecurity - worry: Not on file  . Food insecurity - inability: Not on file  . Transportation needs - medical: Not  on file  . Transportation needs - non-medical: Not on file  Occupational History  . Occupation: Retired  Tobacco Use  . Smoking status: Never Smoker  . Smokeless tobacco: Never Used  Substance and Sexual Activity  . Alcohol use: No  . Drug use: No  . Sexual activity: Not on file  Other Topics Concern  . 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      Objective:   Physical Exam  Constitutional: She is oriented to person, place, and time. She appears well-developed and well-nourished. No distress.  HENT:  Head: Normocephalic and atraumatic.  Right Ear: External ear normal.  Left Ear: External ear normal.  Nose: Nose normal.  Mouth/Throat: Oropharynx is clear and moist. No oropharyngeal exudate.  Eyes: Conjunctivae and EOM are normal. Pupils are equal, round, and reactive to light. Right eye exhibits no discharge. Left eye exhibits no discharge. No scleral icterus.  Neck: Normal range of motion. Neck supple. No JVD present. No tracheal deviation present. No thyromegaly present.  Cardiovascular: Normal rate, regular rhythm, normal heart sounds and intact distal pulses. Exam reveals no gallop and no friction rub.  No murmur heard. Pulmonary/Chest: Effort normal and breath sounds normal. No stridor. No respiratory distress. She has no wheezes. She has no rales. She exhibits no tenderness.  Abdominal: Soft. Bowel sounds are normal. She exhibits no distension and no mass. There is no tenderness. There is no rebound and no guarding.  Musculoskeletal: She exhibits no edema, tenderness or deformity.  Lymphadenopathy:    She has no cervical adenopathy.  Neurological:  She is alert and oriented to person, place, and time. She displays normal reflexes. No cranial nerve deficit. She exhibits normal muscle tone. Coordination normal.  Skin: Skin is warm. No rash noted. She is not diaphoretic. No erythema. No pallor.  Psychiatric: She has a normal mood and affect. Her behavior is normal. Judgment and thought content normal.  Vitals reviewed.         Assessment & Plan:  History of CVA (cerebrovascular accident)  TIA (transient ischemic attack)  Hypertriglyceridemia  Type 2 diabetes mellitus with insulin deficiency ([place]) Her recurrent TIAs or microvascular in origin.  Therefore to treat them we have to focus on risk factor control rather than surgical intervention.  I recommended to the patient that we try a statin again.  She is only failed Lipitor in the past.  Therefore we will discontinue Zetia and replace it with Crestor 20 mg a day in addition to co-Q10.  Use of statins with gemfibrozil is contraindicated.  Therefore we will discontinue gemfibrozil.  I will see her back in 6 weeks.  If triglycerides are extremely elevated, I would then add Vascepa in place of her omega-3 fatty acids to treat her triglyceridemia given its recent studies and reduction in all cause mortality in patient with cardiovascular risk factors.  Continue aspirin and Plavix dual therapy for 2 weeks and then switch to aspirin 325 daily.  If TIAs continue to occur, I would recommend switching to Aggrenox.

## 2017-09-15 ENCOUNTER — Telehealth: Payer: Self-pay

## 2017-09-15 NOTE — Telephone Encounter (Signed)
Spoke to patient. Gave lab results and med instructions. Pt verbalized understanding.

## 2017-09-15 NOTE — Telephone Encounter (Signed)
-----   Message from Elayne Snare, MD sent at 06/28/2017  7:59 AM EST ----- Please call to let patient know that the blood sugar was 245, she needs to take 4 units of Humalog at breakfast if blood sugar is over 150 in the morning and also takes 6 units at dinnertime unless eating a light meal.  Also reminded her to start taking Trulicity weekly

## 2017-09-21 ENCOUNTER — Other Ambulatory Visit: Payer: Self-pay | Admitting: Family Medicine

## 2017-09-27 ENCOUNTER — Ambulatory Visit: Payer: Medicare Other | Admitting: Endocrinology

## 2017-09-27 NOTE — Progress Notes (Signed)
Patient ID: Rebecca Perkins, female   DOB: [birth year], 84 y.o.   MRN: [id number]            Reason for Appointment: Follow-up for Type 2 Diabetes  Referring physician: Pickard  History of Present Illness:          Diagnosis: Type 2 diabetes mellitus, date of diagnosis: 2006       Past history:  At the time of diagnosis she was feeling tired and weak but does not know what her initial blood sugar was. She probably was tried on metformin initially but because of GI side effects discussed not be continued Also did not tolerate Actos because of nausea. She thinks that she was started on insulin within a few months of her initial diagnosis  Not clear what insulin she was trying in the beginning but she thinks it was NovoLog; however for the last few years has been on Lantus only Her blood sugars have been persistently poorly controlled with A1c usually 8-9% about 2011 At initial consultation she was taking 60 units of Lantus insulin without any mealtime coverage On her visit in 3/17 because of her high A1c she was advised to start taking mealtime insulin at suppertime  In 09/2016 because of A1c being 9.9 she was told to start Victoza in addition to her basal bolus insulin regimen  Recent history:   INSULIN regimen is described as: 40 units Toujeo in a.m., Humalog: None  Non-insulin hypoglycemic drugs the patient is taking are: Trulicity 8.24 mg:     Her previous A1c was as low at 6.6 and now continues to go up A1c done in the hospital was 8.4  Current management, blood sugars and problems identified:   She believes she was starting to take Trulicity after her last visit and is regular with this  However not clear why her A1c is still high  Except when she was back from the hospital earlier this month appear to be fairly good in the morning and less variable  Not clear why her blood sugars were as high as 383 when she came back from the hospital, may not have had her basal insulin  and was late for her Trulicity  However as before she forgets to check her sugars after meals, previously these are high  She now says that she thinks it is important for her to check her sugar in the morning but because of painful fingerstick she will not take it later in the day  She thinks she will take him along only if the blood sugar is over 150 in the morning and not taking that in the evening  She is not very active  She thinks she is eating irregularly but may not be eating a full meal at a time except possibly at breakfast  She may also occasionally have a regular soft drinks  Has had a couple of readings in the 70s and once glucose was 67 fasting       Side effects from medications have been: Metformin, Actos: nausea, diarrhea    Compliance with the medical regimen: Fair  Hypoglycemia: None  but she feels a little hypoglycemic when blood sugars are around 80  Glucose monitoring:  done 1-2 times a day         Glucometer: One Touch.      Blood Glucose readings    Recent fasting range 67-214  Median for the last month 151   Self-care: The diet that the patient has  been following is: tries to limit portions .     Meals: 2-3 meals per day. Breakfast is variable, bacon, fruit, egg    Dinner 7 pm  She will snack on peanut butter crackers at lunch, sometimes will eat a sandwich at lunch            Exercise: none  Dietician visit, most recent none               Weight history: Highest weight has been 170  Wt Readings from Last 3 Encounters:  09/28/17 142 lb 3.2 oz (64.5 kg)  09/09/17 139 lb (63 kg)  09/04/17 138 lb (62.6 kg)    Glycemic control:   Lab Results  Component Value Date   HGBA1C 8.4 (H) 09/05/2017   HGBA1C 7.9 06/27/2017   HGBA1C 6.6 03/23/2017   Lab Results  Component Value Date   MICROALBUR 7.6 09/02/2017   LDLCALC NOT CALC 08/27/2016   CREATININE 0.90 09/04/2017    No visits with results within 1 Week(s) from this visit.  Latest known  visit with results is:  Admission on 09/04/2017, Discharged on 09/05/2017  Component Date Value Ref Range Status  . Prothrombin Time 09/04/2017 13.5  11.4 - 15.2 seconds Final  . INR 09/04/2017 1.04   Final   Performed at [facility], [place] [address], [place], [place] [address]  . aPTT 09/04/2017 26  24 - 36 seconds Final   Performed at [facility], [place] [address] [place], [place] [address]  . WBC 09/04/2017 5.5  4.0 - 10.5 K/uL Final  . RBC 09/04/2017 4.21  3.87 - 5.11 MIL/uL Final  . Hemoglobin 09/04/2017 11.4* 12.0 - 15.0 g/dL Final  . HCT 09/04/2017 35.6* 36.0 - 46.0 % Final  . MCV 09/04/2017 84.6  78.0 - 100.0 fL Final  . MCH 09/04/2017 27.1  26.0 - 34.0 pg Final  . MCHC 09/04/2017 32.0  30.0 - 36.0 g/dL Final  . RDW 09/04/2017 13.3  11.5 - 15.5 % Final  . Platelets 09/04/2017 183  150 - 400 K/uL Final   Performed at [facility], [place] [address], [place], [place] [address]  . Neutrophils Relative % 09/04/2017 62  % Final  . Neutro Abs 09/04/2017 3.4  1.7 - 7.7 K/uL Final  . Lymphocytes Relative 09/04/2017 29  % Final  . Lymphs Abs 09/04/2017 1.6  0.7 - 4.0 K/uL Final  . Monocytes Relative 09/04/2017 7  % Final  . Monocytes Absolute 09/04/2017 0.4  0.1 - 1.0 K/uL Final  . Eosinophils Relative 09/04/2017 2  % Final  . Eosinophils Absolute 09/04/2017 0.1  0.0 - 0.7 K/uL Final  . Basophils Relative 09/04/2017 0  % Final  . Basophils Absolute 09/04/2017 0.0  0.0 - 0.1 K/uL Final   Performed at [facility] [address] [place], [place] [address]  . Sodium 09/04/2017 139  135 - 145 mmol/L Final  . Potassium 09/04/2017 3.8  3.5 - 5.1 mmol/L Final  . Chloride 09/04/2017 107  101 - 111 mmol/L Final  . CO2 09/04/2017 22  22 - 32 mmol/L Final  . Glucose, Bld 09/04/2017 152* 65 - 99 mg/dL Final  . BUN 09/04/2017 15  6 - 20 mg/dL Final  . Creatinine, Ser 09/04/2017 1.03* 0.44 - 1.00 mg/dL Final  . Calcium 09/04/2017 9.0  8.9 - 10.3 mg/dL Final  . Total  Protein 09/04/2017 6.5  6.5 - 8.1 g/dL Final  . Albumin 09/04/2017  3.1* 3.5 - 5.0 g/dL Final  . AST 09/04/2017 21  15 - 41 U/L Final  . ALT 09/04/2017 11* 14 - 54 U/L Final  . Alkaline Phosphatase 09/04/2017 86  38 - 126 U/L Final  . Total Bilirubin 09/04/2017 0.3  0.3 - 1.2 mg/dL Final  . GFR calc non Af Amer 09/04/2017 48* >60 mL/min Final  . GFR calc Af Amer 09/04/2017 55* >60 mL/min Final   Comment: (NOTE) The eGFR has been calculated using the CKD EPI equation. This calculation has not been validated in all clinical situations. eGFR's persistently <60 mL/min signify possible Chronic Kidney Disease.   . Anion gap 09/04/2017 10  5 - 15 Final   Performed at [facility], [place] [address], [place], [place] [address]  . Troponin i, poc 09/04/2017 0.00  0.00 - 0.08 ng/mL Final  . Comment 3 09/04/2017          Final   Comment: Due to the release kinetics of cTnI, a negative result within the first hours of the onset of symptoms does not rule out myocardial infarction with certainty. If myocardial infarction is still suspected, repeat the test at appropriate intervals.   . Sodium 09/04/2017 145  135 - 145 mmol/L Final  . Potassium 09/04/2017 3.9  3.5 - 5.1 mmol/L Final  . Chloride 09/04/2017 109  101 - 111 mmol/L Final  . BUN 09/04/2017 18  6 - 20 mg/dL Final  . Creatinine, Ser 09/04/2017 0.90  0.44 - 1.00 mg/dL Final  . Glucose, Bld 09/04/2017 144* 65 - 99 mg/dL Final  . Calcium, Ion 09/04/2017 1.22  1.15 - 1.40 mmol/L Final  . TCO2 09/04/2017 24  22 - 32 mmol/L Final  . Hemoglobin 09/04/2017 11.2* 12.0 - 15.0 g/dL Final  . HCT 09/04/2017 33.0* 36.0 - 46.0 % Final  . Weight 09/05/2017 2,208  oz Final  . Height 09/05/2017 64  in Final  . BP 09/05/2017 121/59  mmHg Final  . Hgb A1c MFr Bld 09/05/2017 8.4* 4.8 - 5.6 % Final   Comment: (NOTE) Pre diabetes:          5.7%-6.4% Diabetes:              >6.4% Glycemic control for   <7.0% adults with diabetes   . Mean Plasma  Glucose 09/05/2017 194.38  mg/dL Final   Performed at [facility] [address] [place], [place] [address]  . Glucose-Capillary 09/04/2017 215* 65 - 99 mg/dL Final  . Glucose-Capillary 09/04/2017 122* 65 - 99 mg/dL Final  . Comment 1 09/04/2017 Notify RN   Final  . Comment 2 09/04/2017 Document in Chart   Final  . Glucose-Capillary 09/05/2017 95  65 - 99 mg/dL Final  . Comment 1 09/05/2017 Notify RN   Final  . Comment 2 09/05/2017 Document in Chart   Final  . Glucose-Capillary 09/05/2017 175* 65 - 99 mg/dL Final      Allergies as of 09/28/2017      Reactions   Lipitor [atorvastatin Calcium] Diarrhea   Niacin And Related Hives   Statins Diarrhea   Macrobid [nitrofurantoin Macrocrystal] Other (See Comments)   unknown      Medication List        Accurate as of 09/28/17  1:20 PM. Always use your most recent med list.          aspirin 81 MG EC tablet Take 1 tablet (81 mg total) by mouth daily.   clopidogrel 75 MG  tablet Commonly known as:  PLAVIX TAKE 1 TABLET BY MOUTH DAILY   ferrous sulfate 325 (65 FE) MG tablet Take 1 tablet (325 mg total) by mouth 3 (three) times daily with meals.   insulin lispro 100 UNIT/ML KiwkPen Commonly known as:  HUMALOG KWIKPEN Use only if BS over 150 in am   insulin lispro 100 UNIT/ML KiwkPen Commonly known as:  HUMALOG KWIKPEN INJECT 6 TO 10 UNITS AT MEAL TIMES   Insulin Pen Needle 31G X 8 MM Misc Commonly known as:  B-D ULTRAFINE III SHORT PEN USE AS DIRECTED TO INJECT TOUJEO DAILY   INSULIN SYRINGE 1CC/29G 29G X 1/2" 1 ML Misc AS DIRECTED.   losartan 100 MG tablet Commonly known as:  COZAAR TAKE 1 TABLET BY MOUTH EVERY DAY   metoprolol tartrate 50 MG tablet Commonly known as:  LOPRESSOR TAKE 1/2 TABLET  BY MOUTH TWICE DAILY   multivitamin tablet Take 1 tablet by mouth daily.   omega-3 acid ethyl esters 1 g capsule Commonly known as:  LOVAZA TAKE 3 CAPSULES BY MOUTH DAILY   omeprazole 20 MG capsule Commonly  known as:  PRILOSEC TAKE ONE CAPSULE BY MOUTH DAILY   ONE TOUCH ULTRA SYSTEM KIT w/Device Kit 1 kit by Does not apply route once. Tests Blood sugar before meals and at bedtime   ONE TOUCH ULTRA TEST test strip Generic drug:  glucose blood USE FOUR TIMES DAILY BEFORE MEALS AND EVERY NIGHT AT BEDTIME   pyridOXINE 100 MG tablet Commonly known as:  VITAMIN B-6 Take 100 mg by mouth daily.   rosuvastatin 20 MG tablet Commonly known as:  CRESTOR Take 1 tablet (20 mg total) by mouth daily.   TOUJEO MAX SOLOSTAR 300 UNIT/ML Sopn Generic drug:  Insulin Glargine Inject 40 Units into the skin every morning.   TRULICITY 4.23 NT/6.1WE Sopn Generic drug:  Dulaglutide INJECT INTO THE ABDOMINAL SKIN ONCE WEEKLY       Allergies:  Allergies  Allergen Reactions  . Lipitor [Atorvastatin Calcium] Diarrhea  . Niacin And Related Hives  . Statins Diarrhea  . Macrobid [Nitrofurantoin Macrocrystal] Other (See Comments)    unknown    Past Medical History:  Diagnosis Date  . Anemia   . Diabetes mellitus type 2, insulin dependent ([place])   . Dyslipidemia   . GERD (gastroesophageal reflux disease)   . Hypertension   . Migraines   . Osteoporosis   . PVD (peripheral vascular disease) ([place])    abi .83 (L), .92 (R)  . RSD (reflex sympathetic dystrophy) 2007   R wrist/hand following fx  . TIA (transient ischemic attack)   . Varicose veins     Past Surgical History:  Procedure Laterality Date  . ABDOMINAL HYSTERECTOMY  1988  . APPENDECTOMY  1966  . BREAST CYST EXCISION    . CATARACT EXTRACTION  10/2009  . CHOLECYSTECTOMY  1989  . Several benign cyst removed     last 1 in 1972  . TUBAL LIGATION    . UMBILICAL HERNIA REPAIR      Family History  Problem Relation Age of Onset  . Stroke Mother 75  . Hypertension Mother   . Clotting disorder Father   . Heart attack Father   . Arrhythmia Sister   . Stroke Brother   . Diabetes Neg Hx   . Thyroid disease Neg Hx     Social History:   reports that  has never smoked. she has never used smokeless tobacco. She reports that she does not  drink alcohol or use drugs.    Review of Systems    She has had a TIA and again had an admission  Most recent eye exam was In 03/2016    She is on Crestor and 3 g Lovaza also but triglycerides tend to be high, followed by PCP   Recently started on Crestor by her PCP instead of fenofibrate and continues to take Lovaza.  Is not scheduled for follow-up      Lab Results  Component Value Date   CHOL 213 (H) 09/02/2017   HDL 29 (L) 09/02/2017   LDLCALC NOT CALC 08/27/2016   LDLDIRECT 41.8 09/13/2011   TRIG 213 (H) 09/02/2017   CHOLHDL 7.3 (H) 09/02/2017                   Thyroid:    On exam has Had a 3 cm nodule on the right side but she has refused to consider biopsy Clinically appears to be unchanged on her follow-up and 2018  Lab Results  Component Value Date   TSH 1.57 06/27/2017       The blood pressure has been managed with losartan 50 mg and metoprolol, Followed by PCP Blood pressure is high normal today            diabetic neuropathy: She has a history of Numbness in her feet and toes    Last foot exam was in 06/2017 showing sensory loss   Physical Examination:  BP 134/86   Pulse 66   Ht '5\' 4"'$  (1.626 m)   Wt 142 lb 3.2 oz (64.5 kg)   SpO2 99%   BMI 24.41 kg/m       ASSESSMENT /PLAN:  Diabetes type 2, uncontrolled    See history of present illness for detailed discussion of her current management and problems identified as well as blood sugar patterns  Unable to explain why her A1c is 8.4 Most likely she is doing postprandial hyperglycemia which is not being monitored This is despite her being irregular with Trulicity also since at least the last 2 months or so She will not take Humalog at suppertime regularly since she does not monitor blood sugars before or after eating  Her son wants her to have a continuous glucose monitor she thinks she will  benefit from this since she does not like to check her sugar more than once a day because of painful fingers sticks  Discussed in detail the advantages of doing readings after meals and that fasting blood sugar only helps her decide on the Toujeo dose over time and does not need to check in the morning daily Discussed blood sugar targets at various times   Recommendations: Reduce Toujeo by 2 units and more if her fasting readings are still low normal, this was discussed Showed her how to use the FreeStyle libre sensor and how this would be used to monitor blood sugar as well as need to check blood sugar at least 3 times a day to be useful Discussed how to make it stick Prescription has been sent for the 14 day sensor Most likely will need to start Humalog if her readings are consistently high after supper  HYPERLIPIDEMIA: She will need to follow-up short-term and will recheck her lipids on the next visit, discussed need for getting LDL back to at least under 100 Low-fat diet was recommended  Hypertension: She will follow-up with her PCP again  Counseling time on subjects discussed in assessment and plan sections is  over 50% of today's 25 minute visit   Patient Instructions  Take 38 on Toujeo  Check blood sugars on waking up  3/7 days  Also check blood sugars about 2 hours after a meal and do this after different meals by rotation  Recommended blood sugar levels on waking up is 90-130 and about 2 hours after meal is 130-160  Please bring your blood sugar monitor to each visit, thank you     Elayne Snare 09/28/2017, 1:20 PM       Note: This office note was prepared with Dragon voice recognition system technology. Any transcriptional errors that result from this process are unintentional.

## 2017-09-28 ENCOUNTER — Other Ambulatory Visit: Payer: Self-pay

## 2017-09-28 ENCOUNTER — Encounter: Payer: Self-pay | Admitting: Endocrinology

## 2017-09-28 ENCOUNTER — Ambulatory Visit: Payer: Medicare Other | Admitting: Endocrinology

## 2017-09-28 VITALS — BP 134/86 | HR 66 | Ht 64.0 in | Wt 142.2 lb

## 2017-09-28 DIAGNOSIS — E1165 Type 2 diabetes mellitus with hyperglycemia: Secondary | ICD-10-CM | POA: Diagnosis not present

## 2017-09-28 DIAGNOSIS — E782 Mixed hyperlipidemia: Secondary | ICD-10-CM | POA: Diagnosis not present

## 2017-09-28 DIAGNOSIS — Z794 Long term (current) use of insulin: Secondary | ICD-10-CM

## 2017-09-28 MED ORDER — FREESTYLE LIBRE 14 DAY SENSOR MISC: 1.0000 [IU] | 4 refills | Status: DC

## 2017-09-28 MED ORDER — FREESTYLE LIBRE 14 DAY READER DEVI: 1.0000 | Freq: Once | 0 refills | Status: AC

## 2017-09-28 NOTE — Patient Instructions (Signed)
Take 38 on Toujeo  Check blood sugars on waking up  3/7 days  Also check blood sugars about 2 hours after a meal and do this after different meals by rotation  Recommended blood sugar levels on waking up is 90-130 and about 2 hours after meal is 130-160  Please bring your blood sugar monitor to each visit, thank you

## 2017-09-29 ENCOUNTER — Telehealth: Payer: Self-pay

## 2017-09-29 NOTE — Telephone Encounter (Signed)
Called patients son [name] and let him know that we have a 10 day Freestyle Libre reader that a patient gave to [place] to give to a patient who could use it and let him know that if she wants this one she can have it. He stated he would discuss with her and call us back to let us know if she wants this one versus the 14 day reader and sensors. I can sent a prescription for the 10 day sensors to see how much this will cost her also.

## 2017-09-30 ENCOUNTER — Other Ambulatory Visit: Payer: Self-pay | Admitting: Family Medicine

## 2017-09-30 ENCOUNTER — Other Ambulatory Visit: Payer: Self-pay | Admitting: Endocrinology

## 2017-10-11 ENCOUNTER — Telehealth: Payer: Self-pay | Admitting: Nutrition

## 2017-10-11 ENCOUNTER — Other Ambulatory Visit: Payer: Self-pay | Admitting: Endocrinology

## 2017-10-11 DIAGNOSIS — Z794 Long term (current) use of insulin: Principal | ICD-10-CM

## 2017-10-11 DIAGNOSIS — E119 Type 2 diabetes mellitus without complications: Secondary | ICD-10-CM

## 2017-10-12 ENCOUNTER — Encounter: Payer: Medicare Other | Attending: Endocrinology | Admitting: Nutrition

## 2017-10-12 DIAGNOSIS — Z794 Long term (current) use of insulin: Principal | ICD-10-CM

## 2017-10-12 DIAGNOSIS — E119 Type 2 diabetes mellitus without complications: Secondary | ICD-10-CM

## 2017-10-12 NOTE — Telephone Encounter (Signed)
Pt. Reported that she needed assistance in starting the [place] sensor.  Appt. Made for Wednesday

## 2017-10-13 NOTE — Patient Instructions (Signed)
Wait one hour, and then scan sensor as directed. Call if questions.

## 2017-10-13 NOTE — Progress Notes (Signed)
Rebecca Perkins and her son were instructed in the difference between sensor readings and blood sugar readings.  They reported good understanding of this. She was shown how to attach the sensor, set up her reader, and how to scan the device, after a one hour warm up period.  They were reminded to brink the scanner to each visit.  They had no final questions.

## 2017-10-21 ENCOUNTER — Ambulatory Visit: Payer: Medicare Other | Admitting: Family Medicine

## 2017-10-21 VITALS — BP 154/80 | HR 80 | Temp 97.7°F | Resp 16 | Ht 64.0 in | Wt 147.0 lb

## 2017-10-21 DIAGNOSIS — M810 Age-related osteoporosis without current pathological fracture: Secondary | ICD-10-CM

## 2017-10-21 DIAGNOSIS — G459 Transient cerebral ischemic attack, unspecified: Secondary | ICD-10-CM

## 2017-10-21 DIAGNOSIS — E781 Pure hyperglyceridemia: Secondary | ICD-10-CM

## 2017-10-21 LAB — CBC WITH DIFFERENTIAL/PLATELET
Basophils Absolute: 49 cells/uL (ref 0–200)
Basophils Relative: 0.7 %
Eosinophils Absolute: 182 cells/uL (ref 15–500)
Eosinophils Relative: 2.6 %
HCT: 34.8 % — ABNORMAL LOW (ref 35.0–45.0)
Hemoglobin: 11.6 g/dL — ABNORMAL LOW (ref 11.7–15.5)
Lymphs Abs: 2058 cells/uL (ref 850–3900)
MCH: 27.3 pg (ref 27.0–33.0)
MCHC: 33.3 g/dL (ref 32.0–36.0)
MCV: 81.9 fL (ref 80.0–100.0)
MPV: 10.2 fL (ref 7.5–12.5)
Monocytes Relative: 7.1 %
Neutro Abs: 4214 cells/uL (ref 1500–7800)
Neutrophils Relative %: 60.2 %
Platelets: 185 10*3/uL (ref 140–400)
RBC: 4.25 10*6/uL (ref 3.80–5.10)
RDW: 13.1 % (ref 11.0–15.0)
Total Lymphocyte: 29.4 %
WBC mixed population: 497 cells/uL (ref 200–950)
WBC: 7 10*3/uL (ref 3.8–10.8)

## 2017-10-21 LAB — LIPID PANEL
Cholesterol: 119 mg/dL (ref <200)
HDL: 33 mg/dL — ABNORMAL LOW (ref >50)
LDL Cholesterol (Calc): 52 mg/dL (calc)
Non-HDL Cholesterol (Calc): 86 mg/dL (calc) (ref <130)
Total CHOL/HDL Ratio: 3.6 (calc) (ref <5.0)
Triglycerides: 285 mg/dL — ABNORMAL HIGH (ref <150)

## 2017-10-21 LAB — COMPLETE METABOLIC PANEL WITH GFR
AG Ratio: 1.3 (calc) (ref 1.0–2.5)
ALT: 14 U/L (ref 6–29)
AST: 20 U/L (ref 10–35)
Albumin: 3.7 g/dL (ref 3.6–5.1)
Alkaline phosphatase (APISO): 84 U/L (ref 33–130)
BUN: 13 mg/dL (ref 7–25)
CO2: 28 mmol/L (ref 20–32)
Calcium: 9.2 mg/dL (ref 8.6–10.4)
Chloride: 107 mmol/L (ref 98–110)
Creat: 0.84 mg/dL (ref 0.60–0.88)
GFR, Est African American: 73 mL/min/{1.73_m2} (ref >=60)
GFR, Est Non African American: 63 mL/min/{1.73_m2} (ref >=60)
Globulin: 2.8 g/dL (calc) (ref 1.9–3.7)
Glucose, Bld: 202 mg/dL — ABNORMAL HIGH (ref 65–99)
Potassium: 4.1 mmol/L (ref 3.5–5.3)
Sodium: 142 mmol/L (ref 135–146)
Total Bilirubin: 0.4 mg/dL (ref 0.2–1.2)
Total Protein: 6.5 g/dL (ref 6.1–8.1)

## 2017-10-21 MED ORDER — DENOSUMAB 60 MG/ML ~~LOC~~ SOLN
60.0000 mg | SUBCUTANEOUS | Status: DC
  Administered 2017-10-21: 60 mg via SUBCUTANEOUS

## 2017-10-21 NOTE — Progress Notes (Signed)
Subjective:    Patient ID: Rebecca Perkins, female    DOB: 1929-03-17, 84 y.o.   MRN: [id number]  HPI  01/21/17 The patient last had lab work in our office in January and at that time her creatinine was 0.89. Her calculated glomerular filtration rate was approximate 60 mL of blood per minute. Last week, her endocrinologist recheck her creatinine and found to be 1.55 with a GFR of 30 mL of blood per minute. Repeat BMP revealed a creatinine of 1.37 with a GFR of 41 mL of blood per minute. She is here today to discuss. Patient is an 84 year old Caucasian female with a long-standing history of hypertriglyceridemia and insulin-dependent diabetes mellitus that has been poorly controlled. She is not taking any type of NSAID. However she is not drinking enough fluid. Recently she was found to be orthostatic and hypotensive causing her dizziness upon standing. She is also taking Lasix due to swelling in her legs. She denies any symptoms of a bladder infection.  At that time, my plan was:  Multi-factorial. Suspect related to age, chronic medical comorbidities, uncontrolled diabetes mellitus, dehydration, and diuretic use. I've asked the patient to discontinue her use of Lasix.  Recheck BMP in 1 week.  Obtain renal US and UA.   02/04/17 Urinalysis obtained at that office visit suggested a serious infection and the patient was treated with antibiotics. She states that by the time she finished antibiotics she felt much better. The dizziness upon standing had essentially resolved. She was no longer orthostatic. She's been trying to drink more fluid. Renal ultrasound was unremarkable. There is no hydronephrosis. There is no mass. She is here today to recheck her urine sample and also to recheck her renal function.  At that time, my plan was:  Symptomatically, the patient feels much better. Orthostatic dizziness has resolved. She appears more hydrated today. I will repeat a urinalysis to ensure resolution of her  urinary tract infection. I will also repeat her renal function as I anticipated it would be better and back to her baseline. Renal ultrasound was reassuring. If worsening, consider an SPEP or UPEP  09/02/17 Patient is here today for a complete physical exam.  She is seeing endocrinology for management of her type 2 diabetes mellitus.  Her last hemoglobin A1c was 7.9 in November.  She is here today for fasting lab work.  Her immunization records are below: Immunization History  Administered Date(s) Administered  . Influenza Split 05/05/2011  . Influenza, High Dose Seasonal PF 05/03/2015, 04/30/2016  . Influenza,inj,Quad PF,6+ Mos 04/25/2014  . Influenza-Unspecified 05/06/2012, 04/02/2013, 04/23/2016  . Pneumococcal Conjugate-13 08/13/2013  . Pneumococcal Polysaccharide-23 08/30/2016  . Tdap 12/15/2016  . Zoster Recombinat (Shingrix) 10/16/2016, 12/15/2016   Due to age, I have recommended against cancer screening including mammograms, Pap smear, or colonoscopy.  Patient's last bone density test was in 2016.  At that time her T score in her left hip was -2.6 qualifying the patient for osteoporosis.  She is on no treatment for osteoporosis.  She is not taking calcium or vitamin D.  Also there is a lesion on her lower left earlobe.  It is approximately 2 cm x 1 cm.  It is erythematous, bleeding, with scale.  It is concerning for a possible skin malignancy.  She is apparently seeing cardiology for this in the past.  I have recommended repeat follow-up for possible biopsy as I am concerned that this may represent a skin cancer.  She is accompanied today by her  son.  Her son reports possible TIA-like symptoms.  He states that the patient recently had an episode of word finding aphasia accompanied with slurred speech.  Symptoms resolved after 10-15 minutes.  Patient did not check her sugar during this event and therefore we are uncertain if this may have been hypoglycemia.  However she has previously had TIAs in  the past and is currently on Plavix for secondary prevention of stroke.  She is not on a statin due to her inability to tolerate statins because of diarrhea.  She is not taking aspirin.  Her last carotid Dopplers were in 2016 and at that time revealed 1-39% carotid artery stenosis.  Echocardiogram at that time revealed no cardioembolic source of stroke.  On exam today, her neurologic exam is completely normal.  At that time, my plan was:  Patient's physical exam is concerning first for the atypical lesion on her right ear.  I will schedule her a follow-up with her dermatologist as I am concerned that this may be an underlying skin cancer.  I am also concerned by her untreated osteoporosis.  I recommended 1000 mg a day of calcium and 1000 units a day of vitamin D.  I will also schedule the patient for prolia.  However my most concerning finding is her history of possible TIA like symptoms.  I am unable to determine if episode was due to hypoglycemia or if she was experiencing a TIA.  However I will go ahead and err on the side of caution.  I recommended adding aspirin 81 mg a day to the Plavix 75 mg she is taking.  I will obtain carotid Dopplers to rule out critical carotid artery stenosis.  I will obtain echocardiogram to rule out cardioembolic sources of stroke.  I recommended adding statin Lipitor 40 mg a day for secondary prevention of stroke despite her history of GI intolerance.  I am not comfortable with Lipitor coupled with gemfibrozil.  Therefore depending upon the results of her lipid panel, we may discontinue the gemfibrozil and switch the patient to Vascepa which has better outcomes data in this particular situation.  I also strongly recommended to the patient if symptoms happen again she call 911 immediately as time is of the essence and then also check her sugars to see if this could be hypoglycemia.  09/09/17 Patient was admitted to the hospital the Sunday after her physical having only taken 1 dose  of aspirin and having not yet started Lipitor with strokelike symptoms.  I reviewed her discharge summary including her carotid Dopplers, her echocardiogram results, and MRA of her brain.  Tentative plan at discharge was to continue aspirin 81 mg a day with Plavix 75 mg a day for 3 weeks and then to resume aspirin 325 mg a day thereafter.  They also added Zetia to her gemfibrozil in an effort to lower her risk of stroke secondary to microvascular sources.  Tentative plan was to try to pursue repatha/praluent and neurology follow-up.  Patient is here today for follow-up to discuss her hospitalization.  Spent more than 30 minutes with the patient reviewing her discharge summary and discussing her risk of stroke and the causes of stroke.  At that time, my plan was: Her recurrent TIAs are microvascular in origin.  Therefore to treat them we have to focus on risk factor control rather than surgical intervention.  I recommended to the patient that we try a statin again.  She is only failed Lipitor in the past.  Therefore  we will discontinue Zetia and replace it with Crestor 20 mg a day in addition to co-Q10.  Use of statins with gemfibrozil is contraindicated.  Therefore we will discontinue gemfibrozil.  I will see her back in 6 weeks.  If triglycerides are extremely elevated, I would then add Vascepa in place of her omega-3 fatty acids to treat her triglyceridemia given its recent studies and reduction in all cause mortality in patient with cardiovascular risk factors.  Continue aspirin and Plavix dual therapy for 2 weeks and then switch to aspirin 325 daily.  If TIAs continue to occur, I would recommend switching to Aggrenox.   10/21/17 Patient is tolerating the Crestor without any myalgias.  She is still taking the Crestor.  She discontinued aspirin as directed by the neurologist at discharge from the hospital.  She is still on Plavix.  She never started 325 aspirin.  Her blood pressure today is elevated at 154/80.   She denies any chest pain or shortness of breath.  She denies any further strokelike symptoms.  She denies any a aphasia or word finding difficulties.  She did discontinue gemfibrozil as I recommended at her last visit.  She is here today to check a fasting lipid panel to monitor her triglycerides Past Medical History:  Diagnosis Date  . Anemia   . Diabetes mellitus type 2, insulin dependent ([place])   . Dyslipidemia   . GERD (gastroesophageal reflux disease)   . Hypertension   . Migraines   . Osteoporosis   . PVD (peripheral vascular disease) ([place])    abi .83 (L), .92 (R)  . RSD (reflex sympathetic dystrophy) 2007   R wrist/hand following fx  . TIA (transient ischemic attack)   . Varicose veins    Past Surgical History:  Procedure Laterality Date  . ABDOMINAL HYSTERECTOMY  1988  . APPENDECTOMY  1966  . BREAST CYST EXCISION    . CATARACT EXTRACTION  10/2009  . CHOLECYSTECTOMY  1989  . Several benign cyst removed     last 1 in 1972  . TUBAL LIGATION    . UMBILICAL HERNIA REPAIR     Current Outpatient Medications on File Prior to Visit  Medication Sig Dispense Refill  . aspirin EC 81 MG EC tablet Take 1 tablet (81 mg total) by mouth daily. 30 tablet 0  . Blood Glucose Monitoring Suppl (ONE TOUCH ULTRA SYSTEM KIT) W/DEVICE KIT 1 kit by Does not apply route once. Tests Blood sugar before meals and at bedtime 1 each 0  . clopidogrel (PLAVIX) 75 MG tablet TAKE 1 TABLET BY MOUTH DAILY 30 tablet 0  . Continuous Blood Gluc Sensor (FREESTYLE LIBRE 14 DAY SENSOR) MISC 1 Units by Does not apply route every 14 (fourteen) days. 2 each 4  . ferrous sulfate 325 (65 FE) MG tablet Take 1 tablet (325 mg total) by mouth 3 (three) times daily with meals. (Patient taking differently: Take 325 mg by mouth 2 (two) times daily with a meal. ) 90 tablet 4  . Insulin Glargine (TOUJEO MAX SOLOSTAR) 300 UNIT/ML SOPN Inject 38 Units into the skin every morning.     . insulin lispro (HUMALOG KWIKPEN) 100 UNIT/ML  KiwkPen Use only if BS over 150 in am 15 mL 1  . insulin lispro (HUMALOG KWIKPEN) 100 UNIT/ML KiwkPen INJECT 6 TO 10 UNITS AT MEAL TIMES 15 mL 2  . Insulin Pen Needle (B-D ULTRAFINE III SHORT PEN) 31G X 8 MM MISC USE AS DIRECTED TO INJECT TOUJEO DAILY 100 each  5  . INSULIN SYRINGE 1CC/29G 29G X 1/2" 1 ML MISC AS DIRECTED. 100 each 3  . losartan (COZAAR) 100 MG tablet TAKE 1 TABLET BY MOUTH EVERY DAY (Patient taking differently: TAKE 1/2 TABLET BY MOUTH EVERY DAY) 90 tablet 3  . metoprolol (LOPRESSOR) 50 MG tablet TAKE 1/2 TABLET  BY MOUTH TWICE DAILY 180 tablet 0  . Multiple Vitamin (MULTIVITAMIN) tablet Take 1 tablet by mouth daily.      Marland Kitchen omega-3 acid ethyl esters (LOVAZA) 1 g capsule TAKE 3 CAPSULES BY MOUTH DAILY 90 capsule 3  . omeprazole (PRILOSEC) 20 MG capsule TAKE ONE CAPSULE BY MOUTH DAILY 90 capsule 3  . ONE TOUCH ULTRA TEST test strip USE FOUR TIMES DAILY BEFORE MEALS AND EVERY NIGHT AT BEDTIME 150 each 5  . Pyridoxine HCl (VITAMIN B-6) 100 MG tablet Take 100 mg by mouth daily.      . rosuvastatin (CRESTOR) 20 MG tablet Take 1 tablet (20 mg total) by mouth daily. 90 tablet 3  . TRULICITY 4.62 VO/3.5KK SOPN INJECT INTO THE ABDOMINAL SKIN ONCE WEEKLY 2 mL 3   No current facility-administered medications on file prior to visit.    Allergies  Allergen Reactions  . Lipitor [Atorvastatin Calcium] Diarrhea  . Niacin And Related Hives  . Statins Diarrhea  . Macrobid [Nitrofurantoin Macrocrystal] Other (See Comments)    unknown   Social History   Socioeconomic History  . Marital status: Widowed    Spouse name: Not on file  . Number of children: 3  . Years of education: Not on file  . Highest education level: Not on file  Occupational History  . Occupation: Retired  Scientific laboratory technician  . Financial resource strain: Not on file  . Food insecurity:    Worry: Not on file    Inability: Not on file  . Transportation needs:    Medical: Not on file    Non-medical: Not on file  Tobacco  Use  . Smoking status: Never Smoker  . Smokeless tobacco: Never Used  Substance and Sexual Activity  . Alcohol use: No  . Drug use: No  . Sexual activity: Not on file  Lifestyle  . Physical activity:    Days per week: Not on file    Minutes per session: Not on file  . Stress: Not on file  Relationships  . Social connections:    Talks on phone: Not on file    Gets together: Not on file    Attends religious service: Not on file    Active member of club or organization: Not on file    Attends meetings of clubs or organizations: Not on file    Relationship status: Not on file  . Intimate partner violence:    Fear of current or ex partner: Not on file    Emotionally abused: Not on file    Physically abused: Not on file    Forced sexual activity: Not on file  Other Topics Concern  . 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      Objective:   Physical Exam  Constitutional: She is oriented to person, place, and time. She appears well-developed and well-nourished. No distress.  HENT:  Head: Normocephalic and atraumatic.  Right Ear: External ear normal.  Left Ear: External ear normal.  Nose: Nose normal.  Mouth/Throat: Oropharynx is clear and moist. No oropharyngeal exudate.  Eyes: Pupils are equal, round, and reactive to light. Conjunctivae and EOM are normal. Right eye exhibits no discharge. Left eye exhibits no discharge. No scleral icterus.  Neck: Normal range of motion. Neck supple. No JVD present. No tracheal deviation present. No thyromegaly present.  Cardiovascular: Normal rate, regular rhythm, normal heart sounds and intact distal pulses. Exam reveals no gallop and no friction rub.  No murmur heard. Pulmonary/Chest: Effort normal and breath sounds normal. No stridor. No  respiratory distress. She has no wheezes. She has no rales. She exhibits no tenderness.  Abdominal: Soft. Bowel sounds are normal. She exhibits no distension and no mass. There is no tenderness. There is no rebound and no guarding.  Musculoskeletal: She exhibits no edema, tenderness or deformity.  Lymphadenopathy:    She has no cervical adenopathy.  Neurological: She is alert and oriented to person, place, and time. She displays normal reflexes. No cranial nerve deficit. She exhibits normal muscle tone. Coordination normal.  Skin: Skin is warm. No rash noted. She is not diaphoretic. No erythema. No pallor.  Psychiatric: She has a normal mood and affect. Her behavior is normal. Judgment and thought content normal.  Vitals reviewed.         Assessment & Plan:  TIA (transient ischemic attack) - Plan: CBC with Differential/Platelet, COMPLETE METABOLIC PANEL WITH GFR, Lipid panel  Hypertriglyceridemia - Plan: CBC with Differential/Platelet, COMPLETE METABOLIC PANEL WITH GFR, Lipid panel  Recommended the patient discontinue Plavix as instructed on her hospital discharge summary.  Resume aspirin 325 mg a day and follow with neurology as planned.  If further TIA symptoms occur, I would recommend Aggrenox in place of aspirin.  Continue Crestor as directed.  Check triglycerides today and if elevated, I would recommend adding Vascepa.  Increase losartan to 100 mg a day given her elevated blood pressure

## 2017-10-21 NOTE — Addendum Note (Signed)
Addended by: Shary Decamp B on: 10/21/2017 11:37 AM   Modules accepted: Orders

## 2017-10-24 ENCOUNTER — Other Ambulatory Visit: Payer: Self-pay | Admitting: Family Medicine

## 2017-10-28 ENCOUNTER — Other Ambulatory Visit: Payer: Self-pay | Admitting: Family Medicine

## 2017-10-28 MED ORDER — ICOSAPENT ETHYL 1 G PO CAPS: 2.0000 g | ORAL_CAPSULE | Freq: Two times a day (BID) | ORAL | 3 refills | Status: DC

## 2017-10-28 NOTE — Progress Notes (Signed)
as

## 2017-11-03 ENCOUNTER — Telehealth: Payer: Self-pay | Admitting: Family Medicine

## 2017-11-03 NOTE — Telephone Encounter (Signed)
Patient called Rebecca Perkins stating that her legs are swelling with some pain. They get worse when she stands on them for long periods and she was wondering what she should do about it as she is not sure if it is coming from the shot (prolia) she got here in office or if it coming from the medication changes????

## 2017-11-03 NOTE — Telephone Encounter (Signed)
Tried to call no answer and no vm 

## 2017-11-03 NOTE — Telephone Encounter (Signed)
Definitely not due to aggrenox, vascepa, or prolia.  Would need to be seen to figure out why.

## 2017-11-04 NOTE — Telephone Encounter (Signed)
Pt aware and apt made 

## 2017-11-08 NOTE — Progress Notes (Signed)
[facility]  PATIENT: Rebecca Perkins DOB: September 02, 1928   REASON FOR VISIT: Follow-up for TIA on 09/04/2017 HISTORY FROM: patient     HISTORY OF PRESENT ILLNESS:  UPDATE 11/08/17 JV: Recent hospital admission for TIA on 09/04/17 secondary to SVD.  Patient came in with right arm weakness and expressive aphasia where the symptoms resolved spontaneously and approximately 45 minutes.  CT scan reviewed and showed no acute intracranial abnormality.  MRI brain reviewed and negative for acute stroke.  MRA of the head showed 50-75% stenosis in the mid basilar, 50% stenosis in left PCA P1/P2 junction and severe stenosis P2/P3 segment in the right PCA.  Bilateral carotid ultrasound showed bilateral stenosis of 1-39%.  2D echo showed an EF of 60-65%.  LDL 149 and A1c 8.4.  Patient has previously been on Lipitor for 6 months but had to stop it due to leg cramps and leg weakness.  She tried Crestor for a few weeks but had to stop due to diarrhea.  She has been taking Lovaza and co-Q10 daily.  After this admission she was also started on Zetia.  Recommended to consider PCS2 inhibitors such as Repatha or Praluent in the outpatient clinic.  She was also discharged on aspirin 81 mg and Plavix for 3 weeks total and then to discontinue Plavix and continue aspirin with an increase to 325 mg daily.  Patient was discharged home in stable condition. Since discharge, patient has been doing well.  She has been taking Crestor 40 mg is been tolerating this well without any side effects or myalgias.  At hospital discharge was recommended for patient to take Zetia and continue Lovaza but patient is taking neither and only Crestor at this time.  Most recent LDL 52 on 10/21/2017.  Continues to take aspirin without side effects of bleeding or bruising.  Blood pressures today's visit elevated at 179/78 but does states she is feeling anxious prior to appointment as she was already running late and had a lot of traffic on  the way in.  She does check her blood pressure at home and typically runs SBP 150s.  SBP advised the patient that blood pressure should be less than 130/90 and advised her to continue to monitor this at home and if blood pressure remains elevated to follow-up with PCP.  Patient does have endocrinologist for diabetic management.  Denies any recent headaches or new or worsening stroke/TIA symptoms.  Patient continues to live alone since her husband passed 2 years ago by her grandson and son check on her daily.  Denies sleep apnea symptoms such as snoring, daytime fatigue or frequent napping.  Denies interest in PREMIERS trial.   Update 09/01/2016 CM: Rebecca Perkins, 84 year old female returns for follow-up with history of TIA in November 2016. She is currently on Plavix without further TIA symptoms, no bruising and no bleeding. Her symptoms at that time was inability to speak or get her words out, no recurrence. She has risk factors of hypertension, blood pressure reading in office today 140/60. She is also insulin-dependent diabetic with most recent hemoglobin A1c 9.9. She complains with some numbness in her feet which may be a pearly manifestation of peripheral neuropathy from her diabetes. She had an increase in her insulin dosage. Her LDL was unable to be calculated Because her triglycerides were 564. Her most recent labs were drawn on 08/30/2016. She has refused statin drugs in the past. She continues to walk to the mailbox for exercise but otherwise gets very little.  She remains independent in activities of daily living and she continues to drive without difficulty She returns for reevaluation.  History 07/31/2017CM Ms. Rebecca Perkins, 84 year old female returns for follow-up. She has a history of hospital admission for TIA in November 2016. She was unable to speak or get her words out for about 15 minutes and then her symptoms resolved she was started on Plavix. Most recent hemoglobin A1c 8.5 managed by Dr. Dwyane Dee.  Lipid profile in April with triglycerides 707 unable to calculate a LDL. She was started back on gemfibrozil. Her diabetes and cholesterol have  remained poorly controlled. She has refused take statin drugs. She walks to the mailbox for exercise. Otherwise she gets little exercise she returns for reevaluation  HISTORY 09/01/15 PS86 year Caucasian lady seen today for first office follow-up visit following hospital admission for TIA in November 2016.Rebecca Perkins is an 84 y.o. female with a 15 min episode of unable to speak the correct words out, winessed by her family. No other focal neuro symptoms at that time. Sx resolved in 10-15 min.Patient was not administered TPA secondary to resolved deficits. She was admitted for further evaluation and treatment. CT scan of the head on admission showed only chronic microvascular disease and MRI scan showed no acute infarct. Old lacunar infarcts noted in the left basal ganglia and thalamus. MRA of the brain showed no large vessel occlusion or stenosis. Carotid ultrasound showed no significant extracranial stenosis. Transthoracic echo showed ejection fraction of 50-55% without cardiac source of embolism. LDL cholesterol was 81 mg percent. Last hemoglobin A1c on 08/22/15 was 8.7. While lipid profile showed total cholesterol 208, triglycerides 372 HDL 32 and LDL 102 mg percent on 08/22/15. She was started on Plavix which is tolerating well with only minor bruising. She however has not yet started protocol during she has filled the prescription and plans to do so. She does have a prior history of statin myalgias.  REVIEW OF SYSTEMS: Full 14 system review of systems performed and notable only for those listed, all others are neg:  No complaints    ALLERGIES: Allergies  Allergen Reactions  . Lipitor [Atorvastatin Calcium] Diarrhea  . Niacin And Related Hives  . Statins Diarrhea  . Macrobid [Nitrofurantoin Macrocrystal] Other (See Comments)    unknown    HOME  MEDICATIONS: Outpatient Medications Prior to Visit  Medication Sig Dispense Refill  . aspirin 325 MG tablet Take 325 mg by mouth daily.    . Blood Glucose Monitoring Suppl (ONE TOUCH ULTRA SYSTEM KIT) W/DEVICE KIT 1 kit by Does not apply route once. Tests Blood sugar before meals and at bedtime 1 each 0  . Calcium Carbonate-Vitamin D (CALTRATE 600+D PO) Take by mouth 2 (two) times daily.    . Coenzyme Q10 (COQ10 PO) Take by mouth.    . Continuous Blood Gluc Sensor (FREESTYLE LIBRE 14 DAY SENSOR) MISC 1 Units by Does not apply route every 14 (fourteen) days. 2 each 4  . ferrous sulfate 325 (65 FE) MG tablet Take 1 tablet (325 mg total) by mouth 3 (three) times daily with meals. (Patient taking differently: Take 325 mg by mouth 2 (two) times daily with a meal. ) 90 tablet 4  . Icosapent Ethyl (VASCEPA) 1 g CAPS Take 2 capsules (2 g total) by mouth 2 (two) times daily. 360 capsule 3  . Insulin Glargine (TOUJEO MAX SOLOSTAR) 300 UNIT/ML SOPN Inject 38 Units into the skin every morning.     . insulin lispro (HUMALOG KWIKPEN) 100 UNIT/ML  KiwkPen Use only if BS over 150 in am 15 mL 1  . insulin lispro (HUMALOG KWIKPEN) 100 UNIT/ML KiwkPen INJECT 6 TO 10 UNITS AT MEAL TIMES 15 mL 2  . Insulin Pen Needle (B-D ULTRAFINE III SHORT PEN) 31G X 8 MM MISC USE AS DIRECTED TO INJECT TOUJEO DAILY 100 each 5  . INSULIN SYRINGE 1CC/29G 29G X 1/2" 1 ML MISC AS DIRECTED. 100 each 3  . losartan (COZAAR) 100 MG tablet TAKE 1 TABLET BY MOUTH EVERY DAY (Patient taking differently: TAKE 1/2 TABLET BY MOUTH EVERY DAY) 90 tablet 3  . metoprolol (LOPRESSOR) 50 MG tablet TAKE 1/2 TABLET  BY MOUTH TWICE DAILY 180 tablet 0  . Multiple Vitamin (MULTIVITAMIN) tablet Take 1 tablet by mouth daily.      Marland Kitchen omeprazole (PRILOSEC) 20 MG capsule TAKE ONE CAPSULE BY MOUTH DAILY 90 capsule 3  . ONE TOUCH ULTRA TEST test strip USE FOUR TIMES DAILY BEFORE MEALS AND EVERY NIGHT AT BEDTIME 150 each 5  . Pyridoxine HCl (VITAMIN B-6) 100 MG  tablet Take 100 mg by mouth daily.      . rosuvastatin (CRESTOR) 20 MG tablet Take 1 tablet (20 mg total) by mouth daily. 90 tablet 3  . TRULICITY 7.06 CB/7.6EG SOPN INJECT INTO THE ABDOMINAL SKIN ONCE WEEKLY 2 mL 3  . aspirin EC 81 MG EC tablet Take 1 tablet (81 mg total) by mouth daily. 30 tablet 0   Facility-Administered Medications Prior to Visit  Medication Dose Route Frequency Provider Last Rate Last Dose  . denosumab (PROLIA) injection 60 mg  60 mg Subcutaneous Q6 months [name], MD   60 mg at 10/21/17 1030    PAST MEDICAL HISTORY: Past Medical History:  Diagnosis Date  . Anemia   . Diabetes mellitus type 2, insulin dependent ([place])   . Dyslipidemia   . GERD (gastroesophageal reflux disease)   . Hypertension   . Migraines   . Osteoporosis   . PVD (peripheral vascular disease) ([place])    abi .83 (L), .92 (R)  . RSD (reflex sympathetic dystrophy) 2007   R wrist/hand following fx  . TIA (transient ischemic attack)   . Varicose veins     PAST SURGICAL HISTORY: Past Surgical History:  Procedure Laterality Date  . ABDOMINAL HYSTERECTOMY  1988  . APPENDECTOMY  1966  . BREAST CYST EXCISION    . CATARACT EXTRACTION  10/2009  . CHOLECYSTECTOMY  1989  . Several benign cyst removed     last 1 in 1972  . TUBAL LIGATION    . UMBILICAL HERNIA REPAIR      FAMILY HISTORY: Family History  Problem Relation Age of Onset  . Stroke Mother 21  . Hypertension Mother   . Clotting disorder Father   . Heart attack Father   . Arrhythmia Sister   . Stroke Brother   . Diabetes Neg Hx   . Thyroid disease Neg Hx     SOCIAL HISTORY: Social History   Socioeconomic History  . Marital status: Widowed    Spouse name: Not on file  . Number of children: 3  . Years of education: Not on file  . Highest education level: Not on file  Occupational History  . Occupation: Retired  Scientific laboratory technician  . Financial resource strain: Not on file  . Food insecurity:    Worry: Not on file     Inability: Not on file  . Transportation needs:    Medical: Not on file  Non-medical: Not on file  Tobacco Use  . Smoking status: Never Smoker  . Smokeless tobacco: Never Used  Substance and Sexual Activity  . Alcohol use: No  . Drug use: No  . Sexual activity: Not on file  Lifestyle  . Physical activity:    Days per week: Not on file    Minutes per session: Not on file  . Stress: Not on file  Relationships  . Social connections:    Talks on phone: Not on file    Gets together: Not on file    Attends religious service: Not on file    Active member of club or organization: Not on file    Attends meetings of clubs or organizations: Not on file    Relationship status: Not on file  . Intimate partner violence:    Fear of current or ex partner: Not on file    Emotionally abused: Not on file    Physically abused: Not on file    Forced sexual activity: Not on file  Other Topics Concern  . 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 General:  Caucasian lady, seated, in no evident distress Head: head normocephalic and atraumatic.  Neck: supple with no carotid  bruits Cardiovascular: regular rate and rhythm, no murmurs Musculoskeletal: no deformity Skin:  no rash/petichiae  Neurological examination  Mental Status: Awake and fully alert. Oriented to place and time.  Attention span, concentration and fund of knowledge appropriate. Mood and affect appropriate.  Cranial Nerves: Pupils equal, briskly reactive to light. Extraocular movements full without nystagmus. Visual fields full to confrontation. Hearing intact. Facial sensation intact. Face, tongue, palate moves normally and symmetrically.    Motor: Normal bulk and tone. Normal strength in all tested extremity muscles.  Mild weakness in right lower extremity Sensory.:  Decreased sensation right lower extremity compared to left lower extremity Coordination: Rapid alternating movements normal in all extremities. Finger-to-nose and heel-to-shin performed accurately bilaterally. Gait and Station: Arises from chair without difficulty. Stance is normal. Gait demonstrates normal stride length and balance . Unable to heel, toe and tandem walk without difficulty. No assistive device Reflexes: 1+ and symmetric. Toes downgoing.   DIAGNOSTIC DATA (LABS, IMAGING, TESTING) - I reviewed patient records, labs, notes, testing and imaging myself where available.  Lab Results  Component Value Date   WBC 7.0 10/21/2017   HGB 11.6 (L) 10/21/2017   HCT 34.8 (L) 10/21/2017   MCV 81.9 10/21/2017   PLT 185 10/21/2017      Component Value Date/Time   NA 142 10/21/2017 1038   K 4.1 10/21/2017 1038   CL 107 10/21/2017 1038   CO2 28 10/21/2017 1038   GLUCOSE 202 (H) 10/21/2017 1038   BUN 13 10/21/2017 1038   CREATININE 0.84 10/21/2017 1038   CALCIUM 9.2 10/21/2017 1038   PROT 6.5 10/21/2017 1038   ALBUMIN 3.1 (L) 09/04/2017 0922   AST 20 10/21/2017 1038   ALT 14 10/21/2017 1038   ALKPHOS 86 09/04/2017 0922   BILITOT 0.4 10/21/2017 1038   GFRNONAA 63 10/21/2017 1038   GFRAA 73 10/21/2017 1038   Lab Results  Component Value  Date   CHOL 119 10/21/2017   HDL 33 (L) 10/21/2017   LDLCALC 52 10/21/2017   LDLDIRECT 41.8 09/13/2011   TRIG 285 (H) 10/21/2017   CHOLHDL 3.6 10/21/2017   Lab Results  Component Value Date   HGBA1C 8.4 (H) 09/05/2017   Lab Results  Component Value Date   VITAMINB12 1,072 (H) 12/10/2013   Lab Results  Component Value Date   TSH 1.57 06/27/2017      ASSESSMENT 61 year lady with TIA versus migraine on 09/04/2017 secondary to small vessel disease.  Vascular risk factors include previous TIA in 2016, DM, HLD,  and HTN.  PLAN  -Continue aspirin 325 daily and Crestor for secondary stroke prevention -f/u PCP regarding HLD and HTN management -f/u endocrinologist for diabetic management -Continue to check blood pressure at home with a log -Maintain strict control of hypertension with blood pressure goal below 130/90, diabetes with hemoglobin A1c goal below 6.5% and cholesterol with LDL cholesterol (bad cholesterol) goal below 70 mg/dL. I also advised the patient to eat a healthy diet with plenty of whole grains, cereals, fruits and vegetables, exercise regularly and maintain ideal body weight.  Followup in the future with me in 6 months or call earlier if needed   Greater than 50% time during this 25 minute consultation visit was spent on counseling and coordination of care about HLD, HTN and DM (risk factors), discussion about risk benefit of anticoagulation and answering questions.   Venancio Poisson, AGNP-BC  [facility] [address] [place], [place] [address]  Phone [phone] Fax [phone]

## 2017-11-09 ENCOUNTER — Encounter: Payer: Self-pay | Admitting: Adult Health

## 2017-11-09 ENCOUNTER — Emergency Department (HOSPITAL_COMMUNITY)
Admission: EM | Admit: 2017-11-09 | Discharge: 2017-11-10 | Disposition: A | Discharge status: Home / Self Care | Payer: Medicare Other | Attending: Emergency Medicine | Admitting: Emergency Medicine

## 2017-11-09 ENCOUNTER — Ambulatory Visit: Payer: Medicare Other | Admitting: Adult Health

## 2017-11-09 ENCOUNTER — Encounter (HOSPITAL_COMMUNITY): Payer: Self-pay

## 2017-11-09 ENCOUNTER — Other Ambulatory Visit: Payer: Self-pay

## 2017-11-09 VITALS — BP 179/98 | HR 79 | Ht 64.0 in | Wt 144.0 lb

## 2017-11-09 DIAGNOSIS — I129 Hypertensive chronic kidney disease with stage 1 through stage 4 chronic kidney disease, or unspecified chronic kidney disease: Secondary | ICD-10-CM | POA: Diagnosis not present

## 2017-11-09 DIAGNOSIS — Z7982 Long term (current) use of aspirin: Secondary | ICD-10-CM | POA: Insufficient documentation

## 2017-11-09 DIAGNOSIS — Z79899 Other long term (current) drug therapy: Secondary | ICD-10-CM | POA: Insufficient documentation

## 2017-11-09 DIAGNOSIS — N183 Chronic kidney disease, stage 3 (moderate): Secondary | ICD-10-CM | POA: Diagnosis not present

## 2017-11-09 DIAGNOSIS — G459 Transient cerebral ischemic attack, unspecified: Secondary | ICD-10-CM

## 2017-11-09 DIAGNOSIS — IMO0001 Reserved for inherently not codable concepts without codable children: Secondary | ICD-10-CM

## 2017-11-09 DIAGNOSIS — E1165 Type 2 diabetes mellitus with hyperglycemia: Secondary | ICD-10-CM | POA: Diagnosis not present

## 2017-11-09 DIAGNOSIS — Z794 Long term (current) use of insulin: Secondary | ICD-10-CM

## 2017-11-09 DIAGNOSIS — E785 Hyperlipidemia, unspecified: Secondary | ICD-10-CM | POA: Diagnosis not present

## 2017-11-09 DIAGNOSIS — E119 Type 2 diabetes mellitus without complications: Secondary | ICD-10-CM | POA: Insufficient documentation

## 2017-11-09 DIAGNOSIS — R471 Dysarthria and anarthria: Secondary | ICD-10-CM | POA: Diagnosis not present

## 2017-11-09 DIAGNOSIS — R479 Unspecified speech disturbances: Secondary | ICD-10-CM | POA: Diagnosis present

## 2017-11-09 DIAGNOSIS — I10 Essential (primary) hypertension: Secondary | ICD-10-CM | POA: Diagnosis not present

## 2017-11-09 NOTE — Patient Instructions (Signed)
Continue aspirin 325 mg daily  and crestor  for secondary stroke prevention  Continue to follow up with PCP regarding cholesterol and blood pressure control  Continue to follow up with your diabetic doctor for diabetes management  Continue to check blood pressure at home  Maintain strict control of hypertension with blood pressure goal below 130/90, diabetes with hemoglobin A1c goal below 6.5% and cholesterol with LDL cholesterol (bad cholesterol) goal below 70 mg/dL. I also advised the patient to eat a healthy diet with plenty of whole grains, cereals, fruits and vegetables, exercise regularly and maintain ideal body weight.  Followup in the future with me in 6 months or call earlier if needed

## 2017-11-09 NOTE — ED Triage Notes (Signed)
Pt LSN 8pm before going to sleep, woke up at 11pm by family and was unable to get words out and slurred speech, TIA in February. No neuro deficits or slurred speech at this time, c/o a headache.

## 2017-11-10 ENCOUNTER — Emergency Department (HOSPITAL_COMMUNITY): Payer: Medicare Other

## 2017-11-10 ENCOUNTER — Ambulatory Visit: Payer: Medicare Other | Admitting: Family Medicine

## 2017-11-10 LAB — DIFFERENTIAL
Basophils Absolute: 0 10*3/uL (ref 0.0–0.1)
Basophils Relative: 1 %
Eosinophils Absolute: 0.2 10*3/uL (ref 0.0–0.7)
Eosinophils Relative: 3 %
Lymphocytes Relative: 41 %
Lymphs Abs: 2.6 10*3/uL (ref 0.7–4.0)
Monocytes Absolute: 0.4 10*3/uL (ref 0.1–1.0)
Monocytes Relative: 7 %
Neutro Abs: 3.2 10*3/uL (ref 1.7–7.7)
Neutrophils Relative %: 48 %

## 2017-11-10 LAB — I-STAT TROPONIN, ED: Troponin i, poc: 0 ng/mL (ref 0.00–0.08)

## 2017-11-10 LAB — COMPREHENSIVE METABOLIC PANEL
ALT: 16 U/L (ref 14–54)
AST: 25 U/L (ref 15–41)
Albumin: 3.6 g/dL (ref 3.5–5.0)
Alkaline Phosphatase: 70 U/L (ref 38–126)
Anion gap: 8 (ref 5–15)
BUN: 11 mg/dL (ref 6–20)
CO2: 24 mmol/L (ref 22–32)
Calcium: 8.9 mg/dL (ref 8.9–10.3)
Chloride: 109 mmol/L (ref 101–111)
Creatinine, Ser: 0.8 mg/dL (ref 0.44–1.00)
GFR calc Af Amer: >60 mL/min (ref >60)
GFR calc non Af Amer: >60 mL/min (ref >60)
Glucose, Bld: 205 mg/dL — ABNORMAL HIGH (ref 65–99)
Potassium: 3.6 mmol/L (ref 3.5–5.1)
Sodium: 141 mmol/L (ref 135–145)
Total Bilirubin: 0.6 mg/dL (ref 0.3–1.2)
Total Protein: 6.9 g/dL (ref 6.5–8.1)

## 2017-11-10 LAB — I-STAT CHEM 8, ED
BUN: 11 mg/dL (ref 6–20)
Calcium, Ion: 1.15 mmol/L (ref 1.15–1.40)
Chloride: 108 mmol/L (ref 101–111)
Creatinine, Ser: 0.7 mg/dL (ref 0.44–1.00)
Glucose, Bld: 197 mg/dL — ABNORMAL HIGH (ref 65–99)
HCT: 35 % — ABNORMAL LOW (ref 36.0–46.0)
Hemoglobin: 11.9 g/dL — ABNORMAL LOW (ref 12.0–15.0)
Potassium: 3.5 mmol/L (ref 3.5–5.1)
Sodium: 143 mmol/L (ref 135–145)
TCO2: 24 mmol/L (ref 22–32)

## 2017-11-10 LAB — CBC
HCT: 37.8 % (ref 36.0–46.0)
Hemoglobin: 12.1 g/dL (ref 12.0–15.0)
MCH: 27.5 pg (ref 26.0–34.0)
MCHC: 32 g/dL (ref 30.0–36.0)
MCV: 85.9 fL (ref 78.0–100.0)
Platelets: 168 10*3/uL (ref 150–400)
RBC: 4.4 MIL/uL (ref 3.87–5.11)
RDW: 13.4 % (ref 11.5–15.5)
WBC: 6.5 10*3/uL (ref 4.0–10.5)

## 2017-11-10 LAB — PROTIME-INR
INR: 1
Prothrombin Time: 13.1 seconds (ref 11.4–15.2)

## 2017-11-10 LAB — CBG MONITORING, ED: Glucose-Capillary: 120 mg/dL — ABNORMAL HIGH (ref 65–99)

## 2017-11-10 LAB — APTT: aPTT: 28 seconds (ref 24–36)

## 2017-11-10 MED ORDER — CLOPIDOGREL BISULFATE 75 MG PO TABS: 75.0000 mg | ORAL_TABLET | Freq: Every day | ORAL | 0 refills | Status: DC

## 2017-11-10 MED ORDER — LORAZEPAM 2 MG/ML IJ SOLN: 0.5000 mg | Freq: Once | INTRAMUSCULAR | Status: DC

## 2017-11-10 NOTE — ED Notes (Signed)
Pt taken to MRI  

## 2017-11-10 NOTE — Discharge Instructions (Addendum)
Stop taking.  Begin taking Plavix as prescribed.  Follow-up with your neurologist in the next few days for a recheck.

## 2017-11-10 NOTE — ED Notes (Signed)
Pt reports some claustrophobia; wants to try to do MRI without medications.

## 2017-11-10 NOTE — ED Provider Notes (Signed)
[facility] EMERGENCY DEPARTMENT Provider Note   CSN: [id number] Arrival date & time: 11/09/17  2341     History   Chief Complaint Chief Complaint  Patient presents with  . Transient Ischemic Attack    HPI Rebecca Perkins is a 84 y.o. female.  This patient is an 84 year old female with past medical history of prior CVA/TIA, type 2 diabetes, peripheral vascular disease.  She presents today for evaluation of of difficulty speaking.  This began at approximately 10 PM.  According to the son, she was having slurred speech and difficulty finding her words.  This lasted for approximately 45 minutes, then resolved.  She now feels fine and has no further complaints.  She was recently admitted for a similar presentation and underwent workup revealing no obvious cause.  She does have some plaque in her carotids, however nothing surgical.  She denies any weakness or numbness of her arms or legs.  She denies any visual disturbances.  The history is provided by the patient and a relative.    Past Medical History:  Diagnosis Date  . Anemia   . Diabetes mellitus type 2, insulin dependent ([place])   . Dyslipidemia   . GERD (gastroesophageal reflux disease)   . Hypertension   . Migraines   . Osteoporosis   . PVD (peripheral vascular disease) ([place])    abi .83 (L), .92 (R)  . RSD (reflex sympathetic dystrophy) 2007   R wrist/hand following fx  . TIA (transient ischemic attack)   . Varicose veins     Patient Active Problem List   Diagnosis Date Noted  . HTN (hypertension) 09/04/2017  . GERD (gastroesophageal reflux disease) 09/04/2017  . Diabetes mellitus type 2, insulin dependent ([place]) 09/04/2017  . Anemia 09/04/2017  . HLD (hyperlipidemia) 09/04/2017  . CKD (chronic kidney disease) stage 3, GFR 30-59 ml/min ([facility]) 09/04/2017  . Aphasia 09/01/2015  . TIA (transient ischemic attack) 06/17/2015  . Osteoporosis 02/18/2015  . Normocytic anemia 12/13/2013  . Diabetes  mellitus type II, uncontrolled ([place]) 12/10/2013  . Pneumonia 12/10/2013  . Sepsis ([place]) 12/09/2013  . Type 2 diabetes mellitus with insulin deficiency ([place])   . Hypertension   . Hypertriglyceridemia   . Dyslipidemia     Past Surgical History:  Procedure Laterality Date  . ABDOMINAL HYSTERECTOMY  1988  . APPENDECTOMY  1966  . BREAST CYST EXCISION    . CATARACT EXTRACTION  10/2009  . CHOLECYSTECTOMY  1989  . Several benign cyst removed     last 1 in 1972  . TUBAL LIGATION    . UMBILICAL HERNIA REPAIR       OB History   None      Home Medications    Prior to Admission medications   Medication Sig Start Date End Date Taking? Authorizing Provider  aspirin 325 MG tablet Take 325 mg by mouth daily.    [provider]  Blood Glucose Monitoring Suppl (ONE TOUCH ULTRA SYSTEM KIT) W/DEVICE KIT 1 kit by Does not apply route once. Tests Blood sugar before meals and at bedtime 07/10/14   [name], MD  Calcium Carbonate-Vitamin D (CALTRATE 600+D PO) Take by mouth 2 (two) times daily.    [provider]  Coenzyme Q10 (COQ10 PO) Take by mouth.    [provider]  Continuous Blood Gluc Sensor (FREESTYLE LIBRE 14 DAY SENSOR) MISC 1 Units by Does not apply route every 14 (fourteen) days. 09/28/17   Elayne Snare, MD  ferrous sulfate  325 (65 FE) MG tablet Take 1 tablet (325 mg total) by mouth 3 (three) times daily with meals. Patient taking differently: Take 325 mg by mouth 2 (two) times daily with a meal.  02/04/17   [name], MD  Icosapent Ethyl (VASCEPA) 1 g CAPS Take 2 capsules (2 g total) by mouth 2 (two) times daily. 10/28/17   [name], MD  Insulin Glargine (TOUJEO MAX SOLOSTAR) 300 UNIT/ML SOPN Inject 38 Units into the skin every morning.     [provider]  insulin lispro (HUMALOG KWIKPEN) 100 UNIT/ML KiwkPen Use only if BS over 150 in am 09/05/17   Eulogio Bear U, DO  insulin lispro (HUMALOG KWIKPEN) 100 UNIT/ML KiwkPen INJECT 6 TO  10 UNITS AT MEAL TIMES 09/06/17   Elayne Snare, MD  Insulin Pen Needle (B-D ULTRAFINE III SHORT PEN) 31G X 8 MM MISC USE AS DIRECTED TO INJECT TOUJEO DAILY 12/22/15   Elayne Snare, MD  INSULIN SYRINGE 1CC/29G 29G X 1/2" 1 ML MISC AS DIRECTED. 07/09/15   Elayne Snare, MD  losartan (COZAAR) 100 MG tablet TAKE 1 TABLET BY MOUTH EVERY DAY Patient taking differently: TAKE 1/2 TABLET BY MOUTH EVERY DAY 09/21/17   [name], MD  metoprolol (LOPRESSOR) 50 MG tablet TAKE 1/2 TABLET  BY MOUTH TWICE DAILY 06/19/15   Geradine Girt, DO  Multiple Vitamin (MULTIVITAMIN) tablet Take 1 tablet by mouth daily.      [provider]  omeprazole (PRILOSEC) 20 MG capsule TAKE ONE CAPSULE BY MOUTH DAILY 07/15/17   [name], MD  ONE TOUCH ULTRA TEST test strip USE FOUR TIMES DAILY BEFORE MEALS AND EVERY NIGHT AT BEDTIME 10/06/16   [name], MD  Pyridoxine HCl (VITAMIN B-6) 100 MG tablet Take 100 mg by mouth daily.      [provider]  rosuvastatin (CRESTOR) 20 MG tablet Take 1 tablet (20 mg total) by mouth daily. 09/09/17   [name], MD  TRULICITY 7.79 TJ/0.3ES SOPN INJECT INTO THE ABDOMINAL SKIN ONCE WEEKLY 02/06/17   Elayne Snare, MD    Family History Family History  Problem Relation Age of Onset  . Stroke Mother 66  . Hypertension Mother   . Clotting disorder Father   . Heart attack Father   . Arrhythmia Sister   . Stroke Brother   . Diabetes Neg Hx   . Thyroid disease Neg Hx     Social History Social History   Tobacco Use  . Smoking status: Never Smoker  . Smokeless tobacco: Never Used  Substance Use Topics  . Alcohol use: No  . Drug use: No     Allergies   Lipitor [atorvastatin calcium]; Niacin and related; Statins; and Macrobid [nitrofurantoin macrocrystal]   Review of Systems Review of Systems  All other systems reviewed and are negative.    Physical Exam Updated Vital Signs BP (!) 149/66   Pulse 84   Temp 98 F (36.7 C)   Resp 20   Ht 5'  4" (1.626 m)   Wt 66.7 kg (147 lb)   SpO2 93%   BMI 25.23 kg/m   Physical Exam  Constitutional: She is oriented to person, place, and time. She appears well-developed and well-nourished. No distress.  HENT:  Head: Normocephalic and atraumatic.  Mouth/Throat: Oropharynx is clear and moist.  Eyes: Pupils are equal, round, and reactive to light. EOM are normal.  Neck: Normal range of motion. Neck supple.  Cardiovascular: Normal rate and regular  rhythm. Exam reveals no gallop and no friction rub.  No murmur heard. Pulmonary/Chest: Effort normal and breath sounds normal. No respiratory distress. She has no wheezes.  Abdominal: Soft. Bowel sounds are normal. She exhibits no distension. There is no tenderness.  Musculoskeletal: Normal range of motion.  Neurological: She is alert and oriented to person, place, and time. No cranial nerve deficit. She exhibits normal muscle tone. Coordination normal.  Skin: Skin is warm and dry. She is not diaphoretic.  Nursing note and vitals reviewed.    ED Treatments / Results  Labs (all labs ordered are listed, but only abnormal results are displayed) Labs Reviewed  COMPREHENSIVE METABOLIC PANEL - Abnormal; Notable for the following components:      Result Value   Glucose, Bld 205 (*)    All other components within normal limits  I-STAT CHEM 8, ED - Abnormal; Notable for the following components:   Glucose, Bld 197 (*)    Hemoglobin 11.9 (*)    HCT 35.0 (*)    All other components within normal limits  PROTIME-INR  APTT  CBC  DIFFERENTIAL  I-STAT TROPONIN, ED  CBG MONITORING, ED    EKG None  Radiology Ct Head Wo Contrast  Result Date: 11/10/2017 CLINICAL DATA:  Slurred speech and dysarthria. EXAM: CT HEAD WITHOUT CONTRAST TECHNIQUE: Contiguous axial images were obtained from the base of the skull through the vertex without intravenous contrast. COMPARISON:  09/04/2017 FINDINGS: Brain: Stable cerebral and cerebellar atrophy with chronic  moderate periventricular subcortical white matter hypo attenuation consistent with small vessel ischemia. Chronic subcortical infarct in the left insula approaching the basal ganglia. No hydrocephalus. No intra-axial mass nor extra-axial fluid collections. Bilateral basal ganglial lacunar infarcts. Vascular: No hyperdense vessels Skull: No skull fracture or suspicious osseous lesions. Sinuses/Orbits: Bilateral cataract extractions. Intact orbits and globes. No acute sinus disease. Clear mastoids. Other: None IMPRESSION: Chronic stable atrophy with moderate small vessel ischemia. No evidence of acute intracranial abnormality. Electronically Signed   By: Ashley Royalty M.D.   On: 11/10/2017 02:22    Procedures Procedures (including critical care time)  Medications Ordered in ED Medications - No data to display   Initial Impression / Assessment and Plan / ED Course  I have reviewed the triage vital signs and the nursing notes.  Pertinent labs & imaging results that were available during my care of the patient were reviewed by me and considered in my medical decision making (see chart for details).  Patient presenting with complaints of difficulty speaking that began earlier this evening.  She recently was admitted after a similar episode and found to have no stroke identified.  Her medications were changed from Plavix to aspirin.  Her workup today reveals no evidence for stroke.  She is now at her neurologic baseline and MRI is clean.  She will be discharged with Plavix in place of aspirin.  Case was discussed with Dr. Lorraine Lax from neurology who agrees with the care and disposition.  Final Clinical Impressions(s) / ED Diagnoses   Final diagnoses:  None    ED Discharge Orders    None       Veryl Speak, MD 11/10/17 [phone]

## 2017-11-10 NOTE — ED Notes (Signed)
Pt second in line for MRI

## 2017-11-12 ENCOUNTER — Other Ambulatory Visit: Payer: Self-pay | Admitting: Family Medicine

## 2017-11-12 ENCOUNTER — Other Ambulatory Visit: Payer: Self-pay | Admitting: Endocrinology

## 2017-11-14 ENCOUNTER — Telehealth: Payer: Self-pay | Admitting: Adult Health

## 2017-11-14 NOTE — Telephone Encounter (Signed)
Called the patient back and made her aware that Janett Billow is updated and know that she went into the hospital and was restarted on the plavix and since there was a treatment plan change she would like to follow up with the pt in a month. I was able to set the pt up on may 13 th at 1:15 pm. Pt verbalized understanding and was appreciative for the call.

## 2017-11-14 NOTE — Telephone Encounter (Signed)
Patient was seen by Rebecca Perkins on 11-09-17 and went to the ED that night and diagnosed with having a TIA. She does not have an appointment in our office until 05-11-18 and wants to know if she should wait that long. Patient states she is fine now. Please call and discuss.

## 2017-11-15 ENCOUNTER — Encounter: Payer: Self-pay | Admitting: Family Medicine

## 2017-11-15 ENCOUNTER — Ambulatory Visit (INDEPENDENT_AMBULATORY_CARE_PROVIDER_SITE_OTHER): Payer: Medicare Other | Admitting: Family Medicine

## 2017-11-15 VITALS — BP 162/84 | HR 77 | Temp 97.8°F | Resp 16 | Ht 64.0 in | Wt 145.0 lb

## 2017-11-15 DIAGNOSIS — E781 Pure hyperglyceridemia: Secondary | ICD-10-CM | POA: Diagnosis not present

## 2017-11-15 DIAGNOSIS — I739 Peripheral vascular disease, unspecified: Secondary | ICD-10-CM | POA: Diagnosis not present

## 2017-11-15 DIAGNOSIS — Z8673 Personal history of transient ischemic attack (TIA), and cerebral infarction without residual deficits: Secondary | ICD-10-CM | POA: Diagnosis not present

## 2017-11-15 DIAGNOSIS — G459 Transient cerebral ischemic attack, unspecified: Secondary | ICD-10-CM | POA: Diagnosis not present

## 2017-11-15 NOTE — Progress Notes (Signed)
Subjective:    Patient ID: Rebecca Perkins, female    DOB: 09-05-1928, 84 y.o.   MRN: [id number]   09/2017  Due to age, I have recommended against cancer screening including mammograms, Pap smear, or colonoscopy.  Patient's last bone density test was in 2016.  At that time her T score in her left hip was -2.6 qualifying the patient for osteoporosis.  She is on no treatment for osteoporosis.  She is not taking calcium or vitamin D.  Also there is a lesion on her lower left earlobe.  It is approximately 2 cm x 1 cm.  It is erythematous, bleeding, with scale.  It is concerning for a possible skin malignancy.  She is apparently seeing cardiology for this in the past.  I have recommended repeat follow-up for possible biopsy as I am concerned that this may represent a skin cancer.  She is accompanied today by her son.  Her son reports possible TIA-like symptoms.  He states that the patient recently had an episode of word finding aphasia accompanied with slurred speech.  Symptoms resolved after 10-15 minutes.  Patient did not check her sugar during this event and therefore we are uncertain if this may have been hypoglycemia.  However she has previously had TIAs in the past and is currently on Plavix for secondary prevention of stroke.  She is not on a statin due to her inability to tolerate statins because of diarrhea.  She is not taking aspirin.  Her last carotid Dopplers were in 2016 and at that time revealed 1-39% carotid artery stenosis.  Echocardiogram at that time revealed no cardioembolic source of stroke.  On exam today, her neurologic exam is completely normal.  At that time, my plan was:  Patient's physical exam is concerning first for the atypical lesion on her right ear.  I will schedule her a follow-up with her dermatologist as I am concerned that this may be an underlying skin cancer.  I am also concerned by her untreated osteoporosis.  I recommended 1000 mg a day of calcium and 1000 units a day  of vitamin D.  I will also schedule the patient for prolia.  However my most concerning finding is her history of possible TIA like symptoms.  I am unable to determine if episode was due to hypoglycemia or if she was experiencing a TIA.  However I will go ahead and err on the side of caution.  I recommended adding aspirin 81 mg a day to the Plavix 75 mg she is taking.  I will obtain carotid Dopplers to rule out critical carotid artery stenosis.  I will obtain echocardiogram to rule out cardioembolic sources of stroke.  I recommended adding statin Lipitor 40 mg a day for secondary prevention of stroke despite her history of GI intolerance.  I am not comfortable with Lipitor coupled with gemfibrozil.  Therefore depending upon the results of her lipid panel, we may discontinue the gemfibrozil and switch the patient to Vascepa which has better outcomes data in this particular situation.  I also strongly recommended to the patient if symptoms happen again she call 911 immediately as time is of the essence and then also check her sugars to see if this could be hypoglycemia.  09/09/17 Patient was admitted to the hospital the Sunday after her physical having only taken 1 dose of aspirin and having not yet started Lipitor with strokelike symptoms.  I reviewed her discharge summary including her carotid Dopplers, her echocardiogram results, and  MRA of her brain.  Tentative plan at discharge was to continue aspirin 81 mg a day with Plavix 75 mg a day for 3 weeks and then to resume aspirin 325 mg a day thereafter.  They also added Zetia to her gemfibrozil in an effort to lower her risk of stroke secondary to microvascular sources.  Tentative plan was to try to pursue repatha/praluent and neurology follow-up.  Patient is here today for follow-up to discuss her hospitalization.  Spent more than 30 minutes with the patient reviewing her discharge summary and discussing her risk of stroke and the causes of stroke.  At that time,  my plan was: Her recurrent TIAs are microvascular in origin.  Therefore to treat them we have to focus on risk factor control rather than surgical intervention.  I recommended to the patient that we try a statin again.  She is only failed Lipitor in the past.  Therefore we will discontinue Zetia and replace it with Crestor 20 mg a day in addition to co-Q10.  Use of statins with gemfibrozil is contraindicated.  Therefore we will discontinue gemfibrozil.  I will see her back in 6 weeks.  If triglycerides are extremely elevated, I would then add Vascepa in place of her omega-3 fatty acids to treat her triglyceridemia given its recent studies and reduction in all cause mortality in patient with cardiovascular risk factors.  Continue aspirin and Plavix dual therapy for 2 weeks and then switch to aspirin 325 daily.  If TIAs continue to occur, I would recommend switching to Aggrenox.   10/21/17 Patient is tolerating the Crestor without any myalgias.  She is still taking the Crestor.  She discontinued aspirin as directed by the neurologist at discharge from the hospital.  She is still on Plavix.  She never started 325 aspirin.  Her blood pressure today is elevated at 154/80.  She denies any chest pain or shortness of breath.  She denies any further strokelike symptoms.  She denies any a aphasia or word finding difficulties.  She did discontinue gemfibrozil as I recommended at her last visit.  She is here today to check a fasting lipid panel to monitor her triglycerides.  AT that time, my plan was:  Recommended the patient discontinue Plavix as instructed on her hospital discharge summary.  Resume aspirin 325 mg a day and follow with neurology as planned.  If further TIA symptoms occur, I would recommend Aggrenox in place of aspirin.  Continue Crestor as directed.  Check triglycerides today and if elevated, I would recommend adding Vascepa.  Increase losartan to 100 mg a day given her elevated blood  pressure  11/15/17 Unfortunately, the patient developed dysarthria and had to go to the hospital again with a TIA.  Symptoms resolved spontaneously after approximately 45 minutes.  This occurred while taking aspirin 325 mg a day.  This is the third event since February.  Originally TIAs were occurring on Plavix.  Then she a TIAs occurring on aspirin and Plavix.  Most recently at the emergency room they switched her from 325 mg aspirin back to Plavix.  She is here today for follow-up.  I am very concerned about the frequency of her TIA-like symptoms and the fact they continue to occur on multiple antiplatelet agents.  Patient is here to discuss future options and management to prevent this from occurring again.  Recently there has been a study indicating the addition of Xarelto 2.5 mg p.o. twice daily in addition to aspirin 81 mg daily in  patients who have a history of coronary artery disease, peripheral vascular disease, that showed reduction in mortality mainly from the prevention of strokes when compared to continuing antiplatelet therapy alone.  I spent more than 30 minutes today discussing this with the patient  Past Medical History:  Diagnosis Date  . Anemia   . Diabetes mellitus type 2, insulin dependent ([place])   . Dyslipidemia   . GERD (gastroesophageal reflux disease)   . Hypertension   . Migraines   . Osteoporosis   . PVD (peripheral vascular disease) ([place])    abi .83 (L), .92 (R)  . RSD (reflex sympathetic dystrophy) 2007   R wrist/hand following fx  . TIA (transient ischemic attack)   . Varicose veins    Past Surgical History:  Procedure Laterality Date  . ABDOMINAL HYSTERECTOMY  1988  . APPENDECTOMY  1966  . BREAST CYST EXCISION    . CATARACT EXTRACTION  10/2009  . CHOLECYSTECTOMY  1989  . Several benign cyst removed     last 1 in 1972  . TUBAL LIGATION    . UMBILICAL HERNIA REPAIR     Current Outpatient Medications on File Prior to Visit  Medication Sig Dispense Refill  .  Blood Glucose Monitoring Suppl (ONE TOUCH ULTRA SYSTEM KIT) W/DEVICE KIT 1 kit by Does not apply route once. Tests Blood sugar before meals and at bedtime 1 each 0  . Calcium Carbonate-Vitamin D (CALTRATE 600+D PO) Take by mouth 2 (two) times daily.    . clopidogrel (PLAVIX) 75 MG tablet Take 1 tablet (75 mg total) by mouth daily. 30 tablet 0  . Coenzyme Q10 (COQ10 PO) Take by mouth.    . Continuous Blood Gluc Sensor (FREESTYLE LIBRE 14 DAY SENSOR) MISC 1 Units by Does not apply route every 14 (fourteen) days. 2 each 4  . ferrous sulfate 325 (65 FE) MG tablet Take 1 tablet (325 mg total) by mouth 3 (three) times daily with meals. (Patient taking differently: Take 325 mg by mouth 2 (two) times daily with a meal. ) 90 tablet 4  . Icosapent Ethyl (VASCEPA) 1 g CAPS Take 2 capsules (2 g total) by mouth 2 (two) times daily. 360 capsule 3  . Insulin Glargine (TOUJEO MAX SOLOSTAR) 300 UNIT/ML SOPN Inject 38 Units into the skin every morning.     . insulin lispro (HUMALOG KWIKPEN) 100 UNIT/ML KiwkPen Use only if BS over 150 in am 15 mL 1  . insulin lispro (HUMALOG KWIKPEN) 100 UNIT/ML KiwkPen INJECT 6 TO 10 UNITS AT MEAL TIMES 15 mL 2  . Insulin Pen Needle (B-D ULTRAFINE III SHORT PEN) 31G X 8 MM MISC USE AS DIRECTED TO INJECT TOUJEO DAILY 100 each 5  . INSULIN SYRINGE 1CC/29G 29G X 1/2" 1 ML MISC AS DIRECTED. 100 each 3  . losartan (COZAAR) 100 MG tablet TAKE 1 TABLET BY MOUTH EVERY DAY (Patient taking differently: TAKE 1/2 TABLET BY MOUTH EVERY DAY) 90 tablet 3  . metoprolol (LOPRESSOR) 50 MG tablet TAKE 1/2 TABLET  BY MOUTH TWICE DAILY 180 tablet 0  . metoprolol tartrate (LOPRESSOR) 50 MG tablet TAKE 1 TABLET BY MOUTH TWICE DAILY 180 tablet 3  . Multiple Vitamin (MULTIVITAMIN) tablet Take 1 tablet by mouth daily.      Marland Kitchen omeprazole (PRILOSEC) 20 MG capsule TAKE ONE CAPSULE BY MOUTH DAILY 90 capsule 3  . ONE TOUCH ULTRA TEST test strip USE FOUR TIMES DAILY BEFORE MEALS AND EVERY NIGHT AT BEDTIME 150 each 5   .  Pyridoxine HCl (VITAMIN B-6) 100 MG tablet Take 100 mg by mouth daily.      . rosuvastatin (CRESTOR) 20 MG tablet Take 1 tablet (20 mg total) by mouth daily. 90 tablet 3  . TRULICITY 7.37 TG/6.2IR SOPN INJECT INTO THE ABDOMINAL SKIN ONCE WEEKLY 2 mL 3  . aspirin 325 MG tablet Take 325 mg by mouth daily.     Current Facility-Administered Medications on File Prior to Visit  Medication Dose Route Frequency Provider Last Rate Last Dose  . denosumab (PROLIA) injection 60 mg  60 mg Subcutaneous Q6 months [name], MD   60 mg at 10/21/17 1030   Allergies  Allergen Reactions  . Lipitor [Atorvastatin Calcium] Diarrhea  . Niacin And Related Hives  . Statins Diarrhea  . Macrobid [Nitrofurantoin Macrocrystal] Other (See Comments)    unknown   Social History   Socioeconomic History  . Marital status: Widowed    Spouse name: Not on file  . Number of children: 3  . Years of education: Not on file  . Highest education level: Not on file  Occupational History  . Occupation: Retired  Scientific laboratory technician  . Financial resource strain: Not on file  . Food insecurity:    Worry: Not on file    Inability: Not on file  . Transportation needs:    Medical: Not on file    Non-medical: Not on file  Tobacco Use  . Smoking status: Never Smoker  . Smokeless tobacco: Never Used  Substance and Sexual Activity  . Alcohol use: No  . Drug use: No  . Sexual activity: Not on file  Lifestyle  . Physical activity:    Days per week: Not on file    Minutes per session: Not on file  . Stress: Not on file  Relationships  . Social connections:    Talks on phone: Not on file    Gets together: Not on file    Attends religious service: Not on file    Active member of club or organization: Not on file    Attends meetings of clubs or organizations: Not on file    Relationship status: Not on file  . Intimate partner violence:    Fear of current or ex partner: Not on file    Emotionally abused: Not on file     Physically abused: Not on file    Forced sexual activity: Not on file  Other Topics Concern  . 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      Objective:   Physical Exam  Constitutional: She is oriented to person, place, and time. She appears well-developed and well-nourished. No distress.  HENT:  Head: Normocephalic and atraumatic.  Right Ear: External ear normal.  Left Ear: External ear normal.  Nose: Nose normal.  Mouth/Throat: Oropharynx is clear and moist. No oropharyngeal exudate.  Eyes: Pupils are equal, round, and reactive to light. Conjunctivae and EOM are normal. Right eye exhibits no discharge. Left eye exhibits no discharge. No scleral icterus.  Neck: Normal range of motion. Neck supple. No JVD present. No tracheal deviation present. No thyromegaly present.  Cardiovascular: Normal rate, regular rhythm, normal heart sounds and intact distal pulses. Exam reveals no gallop and no friction rub.  No murmur heard. Pulmonary/Chest:  Effort normal and breath sounds normal. No stridor. No respiratory distress. She has no wheezes. She has no rales. She exhibits no tenderness.  Abdominal: Soft. Bowel sounds are normal. She exhibits no distension and no mass. There is no tenderness. There is no rebound and no guarding.  Musculoskeletal: She exhibits no edema, tenderness or deformity.  Lymphadenopathy:    She has no cervical adenopathy.  Neurological: She is alert and oriented to person, place, and time. She displays normal reflexes. No cranial nerve deficit. She exhibits normal muscle tone. Coordination normal.  Skin: Skin is warm. No rash noted. She is not diaphoretic. No erythema. No pallor.  Psychiatric: She has a normal mood and affect. Her behavior is normal. Judgment and thought  content normal.  Vitals reviewed.         Assessment & Plan:  TIA (transient ischemic attack)  Hypertriglyceridemia  History of CVA (cerebrovascular accident)  PVD (peripheral vascular disease) ([place]) Spent 30 minutes today discussing options with the patient.  Options include remaining on Plavix 75 mg a day, switching to Aggrenox twice daily, or switching to aspirin 81 mg daily plus the addition of Xarelto 2.5 mg p.o. twice daily.  Given the fact the patient has a history of peripheral vascular disease, history of stroke, and recurrent TIAs on both aspirin and Plavix, I feel that her next best option is to start aspirin 81 mg daily with Xarelto 2.5 mg p.o. twice daily.  I have provided an information packet to the patient to discuss these options with her family and she will call me back with her decision.  In the meantime we will continue Plavix

## 2017-11-18 NOTE — Progress Notes (Signed)
I agree with the above plan 

## 2017-11-22 ENCOUNTER — Other Ambulatory Visit: Payer: Medicare Other

## 2017-11-23 ENCOUNTER — Ambulatory Visit: Payer: Medicare Other | Admitting: Podiatry

## 2017-11-23 DIAGNOSIS — E1149 Type 2 diabetes mellitus with other diabetic neurological complication: Secondary | ICD-10-CM

## 2017-11-23 DIAGNOSIS — B351 Tinea unguium: Secondary | ICD-10-CM

## 2017-11-23 DIAGNOSIS — M79676 Pain in unspecified toe(s): Secondary | ICD-10-CM | POA: Diagnosis not present

## 2017-11-23 DIAGNOSIS — E1140 Type 2 diabetes mellitus with diabetic neuropathy, unspecified: Secondary | ICD-10-CM | POA: Diagnosis not present

## 2017-11-27 NOTE — Progress Notes (Signed)
   SUBJECTIVE Patient with a history of diabetes mellitus presents to office today complaining of elongated, thickened nails that cause pain while ambulating in shoes. She is unable to trim her own nails.  She also expresses concern about BLE swelling. She reports a h/o TIAs and is currently on anticoagulant therapy. She states her left lower extremity feels warmer than her RLE and would like it evaluated. Patient is here for further evaluation and treatment.   Past Medical History:  Diagnosis Date  . Anemia   . Diabetes mellitus type 2, insulin dependent ([place])   . Dyslipidemia   . GERD (gastroesophageal reflux disease)   . Hypertension   . Migraines   . Osteoporosis   . PVD (peripheral vascular disease) ([place])    abi .83 (L), .92 (R)  . RSD (reflex sympathetic dystrophy) 2007   R wrist/hand following fx  . TIA (transient ischemic attack)   . Varicose veins     OBJECTIVE General Patient is awake, alert, and oriented x 3 and in no acute distress. Derm Skin is dry and supple bilateral. Negative open lesions or macerations. Remaining integument unremarkable. Nails are tender, long, thickened and dystrophic with subungual debris, consistent with onychomycosis, 1-5 bilateral. No signs of infection noted. Vasc  DP and PT pedal pulses palpable bilaterally. Temperature gradient within normal limits.  Neuro Epicritic and protective threshold sensation diminished bilaterally.  Musculoskeletal Exam No symptomatic pedal deformities noted bilateral. Muscular strength within normal limits.  ASSESSMENT 1. Diabetes Mellitus w/ peripheral neuropathy 2. Onychomycosis of nail due to dermatophyte bilateral 3. Pain in foot bilateral  PLAN OF CARE 1. Patient evaluated today. 2. Instructed to maintain good pedal hygiene and foot care. Stressed importance of controlling blood sugar.  3. Mechanical debridement of nails 1-5 bilaterally performed using a nail nipper. Filed with dremel without incident.    4. Return to clinic in 3 mos.     Edrick Kins, DPM [facility]  Dr. Edrick Kins, [address]                                        [place], [place] [address]                Office [phone]  Fax [phone]

## 2017-11-28 ENCOUNTER — Ambulatory Visit: Payer: Medicare Other | Admitting: Endocrinology

## 2017-11-28 ENCOUNTER — Encounter: Payer: Self-pay | Admitting: Endocrinology

## 2017-11-28 VITALS — BP 150/80 | HR 80 | Ht 64.0 in | Wt 146.0 lb

## 2017-11-28 DIAGNOSIS — Z794 Long term (current) use of insulin: Secondary | ICD-10-CM

## 2017-11-28 DIAGNOSIS — E1165 Type 2 diabetes mellitus with hyperglycemia: Secondary | ICD-10-CM

## 2017-11-28 DIAGNOSIS — E041 Nontoxic single thyroid nodule: Secondary | ICD-10-CM

## 2017-11-28 DIAGNOSIS — E782 Mixed hyperlipidemia: Secondary | ICD-10-CM

## 2017-11-28 NOTE — Patient Instructions (Addendum)
Reduce Toujeo to 34 units  No regular Cokes  Stay on Trulicity

## 2017-11-28 NOTE — Progress Notes (Signed)
Patient ID: Rebecca Perkins, female   DOB: 07-27-1929, 84 y.o.   MRN: [id number]            Reason for Appointment: Follow-up for Type 2 Diabetes  Referring physician: [place]  History of Present Illness:          Diagnosis: Type 2 diabetes mellitus, date of diagnosis: 2006       Past history:  At the time of diagnosis she was feeling tired and weak but does not know what her initial blood sugar was. She probably was tried on metformin initially but because of GI side effects discussed not be continued Also did not tolerate Actos because of nausea. She thinks that she was started on insulin within a few months of her initial diagnosis  Not clear what insulin she was trying in the beginning but she thinks it was NovoLog; however for the last few years has been on Lantus only Her blood sugars have been persistently poorly controlled with A1c usually 8-9% about 2011 At initial consultation she was taking 60 units of Lantus insulin without any mealtime coverage On her visit in 3/17 because of her high A1c she was advised to start taking mealtime insulin at suppertime  In 09/2016 because of A1c being 9.9 she was told to start Victoza in addition to her basal bolus insulin regimen  Recent history:   INSULIN regimen is described as: 38 units Toujeo in a.m., Humalog: None  Non-insulin hypoglycemic drugs the patient is taking are: Trulicity 8.26 mg:     Her previous A1c was 8.4 in February  Current management, blood sugars and problems identified:   She has started using the [employer] sensor for her convenience and better compliance with monitoring as well as because of painful fingersticks  She has had no difficulty using this although may not be taking enough readings late in the evening  She has been also very regular with her Trulicity every Sunday  HIGHEST blood sugars are in the afternoon and blood sugars seem to go up progressively between 10 AM and 5 PM  This is because  of her having some snacks with carbohydrates and drinking regular soft drinks  Also may sometimes have a notably large bedtime snack for fear of hypoglycemia and blood sugars periodically will be over 250 at bedtime  However she also has had tendency to mild HYPOGLYCEMIA either early morning and last evening 8 PM  She also returns to over treat blood sugars that are low normal with excessive carbohydrate causing marked rebound  Her blood sugar average on a daily basis ranges between 135 and 225  Her meal intake is quite variable at different times both in quantity and composition  She has maintained her weight about the same recently       Side effects from medications have been: Metformin, Actos: nausea, diarrhea    Compliance with the medical regimen: Fair  Hypoglycemia: None  but she feels a little hypoglycemic when blood sugars are around 80  Glucose monitoring:  done 1-2 times a day         Glucometer:   Freestyle libre Blood Glucose reading averages     She has 5% of readings below 70 and 49% above 180  PRE-MEAL Fasting Lunch Dinner Bedtime Overall  Glucose range: 45-185      Mean/median:  127  178  203  226  175+/-60   POST-MEAL PC Breakfast PC Lunch PC Dinner  Glucose range:  Mean/median:   193       Self-care: The diet that the patient has been following is: tries to limit portions .     Meals: 2-3 meals per day. Breakfast is variable, bacon, fruit, egg    Dinner 7 pm  She will snack on peanut butter crackers at lunch, sometimes will eat a sandwich at lunch            Exercise: none  Dietician visit, most recent none               Weight history: Highest weight has been 170  Wt Readings from Last 3 Encounters:  11/28/17 146 lb (66.2 kg)  11/15/17 145 lb (65.8 kg)  11/09/17 147 lb (66.7 kg)    Glycemic control:   Lab Results  Component Value Date   HGBA1C 8.4 (H) 09/05/2017   HGBA1C 7.9 06/27/2017   HGBA1C 6.6 03/23/2017   Lab Results    Component Value Date   MICROALBUR 7.6 09/02/2017   LDLCALC 52 10/21/2017   CREATININE 0.70 11/10/2017    No visits with results within 1 Week(s) from this visit.  Latest known visit with results is:  Admission on 11/09/2017, Discharged on 11/10/2017  Component Date Value Ref Range Status  . Prothrombin Time 11/09/2017 13.1  11.4 - 15.2 seconds Final  . INR 11/09/2017 1.00   Final   Performed at [facility], [place] [address] [place], [place] [address]  . aPTT 11/09/2017 28  24 - 36 seconds Final   Performed at [facility], [place] [address] [place], [place] [address]  . WBC 11/09/2017 6.5  4.0 - 10.5 K/uL Final  . RBC 11/09/2017 4.40  3.87 - 5.11 MIL/uL Final  . Hemoglobin 11/09/2017 12.1  12.0 - 15.0 g/dL Final  . HCT 11/09/2017 37.8  36.0 - 46.0 % Final  . MCV 11/09/2017 85.9  78.0 - 100.0 fL Final  . MCH 11/09/2017 27.5  26.0 - 34.0 pg Final  . MCHC 11/09/2017 32.0  30.0 - 36.0 g/dL Final  . RDW 11/09/2017 13.4  11.5 - 15.5 % Final  . Platelets 11/09/2017 168  150 - 400 K/uL Final   Performed at [facility], [place] [address] [place], [place] [address]  . Neutrophils Relative % 11/09/2017 48  % Final  . Neutro Abs 11/09/2017 3.2  1.7 - 7.7 K/uL Final  . Lymphocytes Relative 11/09/2017 41  % Final  . Lymphs Abs 11/09/2017 2.6  0.7 - 4.0 K/uL Final  . Monocytes Relative 11/09/2017 7  % Final  . Monocytes Absolute 11/09/2017 0.4  0.1 - 1.0 K/uL Final  . Eosinophils Relative 11/09/2017 3  % Final  . Eosinophils Absolute 11/09/2017 0.2  0.0 - 0.7 K/uL Final  . Basophils Relative 11/09/2017 1  % Final  . Basophils Absolute 11/09/2017 0.0  0.0 - 0.1 K/uL Final   Performed at [place] [address] [place], [place] [address]  . Sodium 11/09/2017 141  135 - 145 mmol/L Final  . Potassium 11/09/2017 3.6  3.5 - 5.1 mmol/L Final  . Chloride 11/09/2017 109  101 - 111 mmol/L Final  . CO2 11/09/2017 24  22 - 32 mmol/L Final  . Glucose, Bld 11/09/2017 205* 65 -  99 mg/dL Final  . BUN 11/09/2017 11  6 - 20 mg/dL Final  . Creatinine, Ser 11/09/2017 0.80  0.44 - 1.00 mg/dL Final  . Calcium 11/09/2017 8.9  8.9 - 10.3  mg/dL Final  . Total Protein 11/09/2017 6.9  6.5 - 8.1 g/dL Final  . Albumin 11/09/2017 3.6  3.5 - 5.0 g/dL Final  . AST 11/09/2017 25  15 - 41 U/L Final  . ALT 11/09/2017 16  14 - 54 U/L Final  . Alkaline Phosphatase 11/09/2017 70  38 - 126 U/L Final  . Total Bilirubin 11/09/2017 0.6  0.3 - 1.2 mg/dL Final  . GFR calc non Af Amer 11/09/2017 >60  >60 mL/min Final  . GFR calc Af Amer 11/09/2017 >60  >60 mL/min Final   Comment: (NOTE) The eGFR has been calculated using the CKD EPI equation. This calculation has not been validated in all clinical situations. eGFR's persistently <60 mL/min signify possible Chronic Kidney Disease.   Georgiann Hahn gap 11/09/2017 8  5 - 15 Final   Performed at [facility], [place] [address], [place],  [address]  . Troponin i, poc 11/10/2017 0.00  0.00 - 0.08 ng/mL Final  . Comment 3 11/10/2017          Final   Comment: Due to the release kinetics of cTnI, a negative result within the first hours of the onset of symptoms does not rule out myocardial infarction with certainty. If myocardial infarction is still suspected, repeat the test at appropriate intervals.   . Glucose-Capillary 11/10/2017 120* 65 - 99 mg/dL Final  . Sodium 11/10/2017 143  135 - 145 mmol/L Final  . Potassium 11/10/2017 3.5  3.5 - 5.1 mmol/L Final  . Chloride 11/10/2017 108  101 - 111 mmol/L Final  . BUN 11/10/2017 11  6 - 20 mg/dL Final  . Creatinine, Ser 11/10/2017 0.70  0.44 - 1.00 mg/dL Final  . Glucose, Bld 11/10/2017 197* 65 - 99 mg/dL Final  . Calcium, Ion 11/10/2017 1.15  1.15 - 1.40 mmol/L Final  . TCO2 11/10/2017 24  22 - 32 mmol/L Final  . Hemoglobin 11/10/2017 11.9* 12.0 - 15.0 g/dL Final  . HCT 11/10/2017 35.0* 36.0 - 46.0 % Final      Allergies as of 11/28/2017      Reactions   Lipitor [atorvastatin  Calcium] Diarrhea   Niacin And Related Hives   Statins Diarrhea   Macrobid [nitrofurantoin Macrocrystal] Other (See Comments)   unknown      Medication List        Accurate as of 11/28/17  1:21 PM. Always use your most recent med list.          CALTRATE 600+D PO Take by mouth 2 (two) times daily.   clopidogrel 75 MG tablet Commonly known as:  PLAVIX Take 1 tablet (75 mg total) by mouth daily.   COQ10 PO Take by mouth.   ferrous sulfate 325 (65 FE) MG tablet Take 1 tablet (325 mg total) by mouth 3 (three) times daily with meals.   FREESTYLE LIBRE 14 DAY SENSOR Misc 1 Units by Does not apply route every 14 (fourteen) days.   Icosapent Ethyl 1 g Caps Commonly known as:  VASCEPA Take 2 capsules (2 g total) by mouth 2 (two) times daily.   insulin lispro 100 UNIT/ML KiwkPen Commonly known as:  HUMALOG KWIKPEN Use only if BS over 150 in am   Insulin Pen Needle 31G X 8 MM Misc Commonly known as:  B-D ULTRAFINE III SHORT PEN USE AS DIRECTED TO INJECT TOUJEO DAILY   INSULIN SYRINGE 1CC/29G 29G X 1/2" 1 ML Misc AS DIRECTED.   losartan 100 MG tablet Commonly known as:  COZAAR TAKE 1 TABLET BY MOUTH EVERY DAY   metoprolol tartrate 50 MG tablet Commonly known as:  LOPRESSOR TAKE 1/2 TABLET  BY MOUTH TWICE DAILY   multivitamin tablet Take 1 tablet by mouth daily.   omeprazole 20 MG capsule Commonly known as:  PRILOSEC TAKE ONE CAPSULE BY MOUTH DAILY   ONE TOUCH ULTRA SYSTEM KIT w/Device Kit 1 kit by Does not apply route once. Tests Blood sugar before meals and at bedtime   ONE TOUCH ULTRA TEST test strip Generic drug:  glucose blood USE FOUR TIMES DAILY BEFORE MEALS AND EVERY NIGHT AT BEDTIME   pyridOXINE 100 MG tablet Commonly known as:  VITAMIN B-6 Take 100 mg by mouth daily.   rosuvastatin 20 MG tablet Commonly known as:  CRESTOR Take 1 tablet (20 mg total) by mouth daily.   TOUJEO MAX SOLOSTAR 300 UNIT/ML Sopn Generic drug:  Insulin Glargine Inject 38  Units into the skin every morning.   TRULICITY 1.61 WR/6.0AV Sopn Generic drug:  Dulaglutide INJECT INTO THE ABDOMINAL SKIN ONCE WEEKLY       Allergies:  Allergies  Allergen Reactions  . Lipitor [Atorvastatin Calcium] Diarrhea  . Niacin And Related Hives  . Statins Diarrhea  . Macrobid [Nitrofurantoin Macrocrystal] Other (See Comments)    unknown    Past Medical History:  Diagnosis Date  . Anemia   . Diabetes mellitus type 2, insulin dependent ([place])   . Dyslipidemia   . GERD (gastroesophageal reflux disease)   . Hypertension   . Migraines   . Osteoporosis   . PVD (peripheral vascular disease) ([place])    abi .83 (L), .92 (R)  . RSD (reflex sympathetic dystrophy) 2007   R wrist/hand following fx  . TIA (transient ischemic attack)   . Varicose veins     Past Surgical History:  Procedure Laterality Date  . ABDOMINAL HYSTERECTOMY  1988  . APPENDECTOMY  1966  . BREAST CYST EXCISION    . CATARACT EXTRACTION  10/2009  . CHOLECYSTECTOMY  1989  . Several benign cyst removed     last 1 in 1972  . TUBAL LIGATION    . UMBILICAL HERNIA REPAIR      Family History  Problem Relation Age of Onset  . Stroke Mother 79  . Hypertension Mother   . Clotting disorder Father   . Heart attack Father   . Arrhythmia Sister   . Stroke Brother   . Diabetes Neg Hx   . Thyroid disease Neg Hx     Social History:  reports that she has never smoked. She has never used smokeless tobacco. She reports that she does not drink alcohol or use drugs.    Review of Systems    She has had recurrent TIAs   She is on Crestor 20 mg and 4 g Vascepa also but triglycerides tend to be high, followed by PCP  Recently started on Vascepa      Lab Results  Component Value Date   CHOL 119 10/21/2017   HDL 33 (L) 10/21/2017   LDLCALC 52 10/21/2017   LDLDIRECT 41.8 09/13/2011   TRIG 285 (H) 10/21/2017   CHOLHDL 3.6 10/21/2017                   Thyroid:    On exam has Had a 3 cm nodule on the  right side but she has refused to consider biopsy Clinically appears to be unchanged on her follow-up  Lab Results  Component Value Date  TSH 1.57 06/27/2017       The blood pressure has been managed with losartan 50 mg and metoprolol, Followed by PCP Blood pressure is high normal again           Diabetic neuropathy: She has a history of Numbness in her feet and toes    Last foot exam was in 06/2017 showing sensory loss   Physical Examination:  BP (!) 150/80 (BP Location: Left Arm, Patient Position: Sitting, Cuff Size: Normal)   Pulse 80   Ht _0  (1.626 m)   Wt 146 lb (66.2 kg)   SpO2 96%   BMI 25.06 kg/m   She has a small firm nodule on the right side, right lobe measures about twice normal, left lobe not palpable No local lymphadenopathy    ASSESSMENT /PLAN:  Diabetes type 2, uncontrolled    See history of present illness for detailed discussion of her current management and problems identified as well as blood sugar patterns  Now with taking Trulicity and mostly basal insulin her blood sugars are still not consistently controlled She is using the Freestyle libre CGM and this has helped her to check her sugar much more frequently and at different times of the day  She appears to have hyperglycemia when she is drinking regular soft drinks for snacking especially in the afternoon Also getting excessively high sugars when she over treats no normal or low sugars  Currently tending to be hypoglycemic at times including on waking up and at least once before suppertime indicating excess basal insulin   Recommendations: Reduce Toujeo by 4 units and she will go down to 34 units Given instructions in writing Discussed blood sugar targets both fasting and after meals Since she is not able to take Humalog consistently and afraid to take it she will need to start cutting back on regular soft drinks Likely does not need to take Humalog consistently since no consistent  postprandial pattern seen her breakfast and evening meals With reducing Toujeo she will have less likelihood of low normal or low sugars and also not tend to overtreat these readings causing significant rebound  Overall blood sugars are averaging 175 which may be reasonable for her age Current glucose management indicator calculated from her 2-week CGM indicates approximate A1c of 7.5  HYPERLIPIDEMIA: Followed by PCP. Tends to have high triglycerides but likely the last level was nonfasting  Hypertension: She will follow-up with her PCP   Counseling time on subjects discussed in assessment and plan sections is over 50% of today's 25 minute visit    There are no Patient Instructions on file for this visit.   Elayne Snare 11/28/2017, 1:21 PM       Note: This office note was prepared with Dragon voice recognition system technology. Any transcriptional errors that result from this process are unintentional.

## 2017-12-01 ENCOUNTER — Encounter: Payer: Self-pay | Admitting: Family Medicine

## 2017-12-01 ENCOUNTER — Ambulatory Visit: Payer: Medicare Other | Admitting: Family Medicine

## 2017-12-01 VITALS — BP 150/78 | HR 80 | Temp 98.3°F | Resp 18 | Ht 64.0 in | Wt 147.0 lb

## 2017-12-01 DIAGNOSIS — M7989 Other specified soft tissue disorders: Secondary | ICD-10-CM | POA: Diagnosis not present

## 2017-12-01 MED ORDER — FUROSEMIDE 40 MG PO TABS: 40.0000 mg | ORAL_TABLET | Freq: Every day | ORAL | 0 refills | Status: DC

## 2017-12-01 NOTE — Progress Notes (Signed)
Subjective:    Patient ID: Rebecca Perkins, female    DOB: [birth year], 84 y.o.   MRN: [id number]  HPI  Patient presents with +1 pitting edema in both legs distal to the knee.  She complains of heaviness and tightness in her legs.  She denies any shortness of breath, orthopnea, paroxysmal nocturnal dyspnea, cough.  She denies any chest pain.  She denies any recent increase in sodium intake.  She has a negative Homans sign.  There is no erythema. Past Medical History:  Diagnosis Date  . Anemia   . Diabetes mellitus type 2, insulin dependent ([place])   . Dyslipidemia   . GERD (gastroesophageal reflux disease)   . Hypertension   . Migraines   . Osteoporosis   . PVD (peripheral vascular disease) ([place])    abi .83 (L), .92 (R)  . RSD (reflex sympathetic dystrophy) 2007   R wrist/hand following fx  . TIA (transient ischemic attack)   . Varicose veins    Past Surgical History:  Procedure Laterality Date  . ABDOMINAL HYSTERECTOMY  1988  . APPENDECTOMY  1966  . BREAST CYST EXCISION    . CATARACT EXTRACTION  10/2009  . CHOLECYSTECTOMY  1989  . Several benign cyst removed     last 1 in 1972  . TUBAL LIGATION    . UMBILICAL HERNIA REPAIR     Current Outpatient Medications on File Prior to Visit  Medication Sig Dispense Refill  . Blood Glucose Monitoring Suppl (ONE TOUCH ULTRA SYSTEM KIT) W/DEVICE KIT 1 kit by Does not apply route once. Tests Blood sugar before meals and at bedtime 1 each 0  . Calcium Carbonate-Vitamin D (CALTRATE 600+D PO) Take by mouth 2 (two) times daily.    . clopidogrel (PLAVIX) 75 MG tablet Take 1 tablet (75 mg total) by mouth daily. 30 tablet 0  . Coenzyme Q10 (COQ10 PO) Take by mouth.    . Continuous Blood Gluc Sensor (FREESTYLE LIBRE 14 DAY SENSOR) MISC 1 Units by Does not apply route every 14 (fourteen) days. 2 each 4  . ferrous sulfate 325 (65 FE) MG tablet Take 1 tablet (325 mg total) by mouth 3 (three) times daily with meals. (Patient taking differently: Take  325 mg by mouth 2 (two) times daily with a meal. ) 90 tablet 4  . Icosapent Ethyl (VASCEPA) 1 g CAPS Take 2 capsules (2 g total) by mouth 2 (two) times daily. 360 capsule 3  . Insulin Glargine (TOUJEO MAX SOLOSTAR) 300 UNIT/ML SOPN Inject 38 Units into the skin every morning.     . insulin lispro (HUMALOG KWIKPEN) 100 UNIT/ML KiwkPen Use only if BS over 150 in am 15 mL 1  . Insulin Pen Needle (B-D ULTRAFINE III SHORT PEN) 31G X 8 MM MISC USE AS DIRECTED TO INJECT TOUJEO DAILY 100 each 5  . INSULIN SYRINGE 1CC/29G 29G X 1/2" 1 ML MISC AS DIRECTED. 100 each 3  . losartan (COZAAR) 100 MG tablet TAKE 1 TABLET BY MOUTH EVERY DAY (Patient taking differently: TAKE 1/2 TABLET BY MOUTH EVERY DAY) 90 tablet 3  . metoprolol (LOPRESSOR) 50 MG tablet TAKE 1/2 TABLET  BY MOUTH TWICE DAILY 180 tablet 0  . Multiple Vitamin (MULTIVITAMIN) tablet Take 1 tablet by mouth daily.      Marland Kitchen omeprazole (PRILOSEC) 20 MG capsule TAKE ONE CAPSULE BY MOUTH DAILY 90 capsule 3  . ONE TOUCH ULTRA TEST test strip USE FOUR TIMES DAILY BEFORE MEALS AND EVERY NIGHT AT BEDTIME 150  each 5  . Pyridoxine HCl (VITAMIN B-6) 100 MG tablet Take 100 mg by mouth daily.      . rosuvastatin (CRESTOR) 20 MG tablet Take 1 tablet (20 mg total) by mouth daily. 90 tablet 3  . TRULICITY 3.14 HF/0.2OV SOPN INJECT INTO THE ABDOMINAL SKIN ONCE WEEKLY 2 mL 3   Current Facility-Administered Medications on File Prior to Visit  Medication Dose Route Frequency Provider Last Rate Last Dose  . denosumab (PROLIA) injection 60 mg  60 mg Subcutaneous Q6 months [name], MD   60 mg at 10/21/17 1030   Allergies  Allergen Reactions  . Lipitor [Atorvastatin Calcium] Diarrhea  . Niacin And Related Hives  . Statins Diarrhea  . Macrobid [Nitrofurantoin Macrocrystal] Other (See Comments)    unknown   Social History   Socioeconomic History  . Marital status: Widowed    Spouse name: Not on file  . Number of children: 3  . Years of education: Not on  file  . Highest education level: Not on file  Occupational History  . Occupation: Retired  Scientific laboratory technician  . Financial resource strain: Not on file  . Food insecurity:    Worry: Not on file    Inability: Not on file  . Transportation needs:    Medical: Not on file    Non-medical: Not on file  Tobacco Use  . Smoking status: Never Smoker  . Smokeless tobacco: Never Used  Substance and Sexual Activity  . Alcohol use: No  . Drug use: No  . Sexual activity: Not on file  Lifestyle  . Physical activity:    Days per week: Not on file    Minutes per session: Not on file  . Stress: Not on file  Relationships  . Social connections:    Talks on phone: Not on file    Gets together: Not on file    Attends religious service: Not on file    Active member of club or organization: Not on file    Attends meetings of clubs or organizations: Not on file    Relationship status: Not on file  . Intimate partner violence:    Fear of current or ex partner: Not on file    Emotionally abused: Not on file    Physically abused: Not on file    Forced sexual activity: Not on file  Other Topics Concern  . 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      Objective:   Physical Exam  Constitutional: She appears well-developed and well-nourished.  Neck: No JVD present.  Cardiovascular: Normal rate and regular rhythm.  Pulmonary/Chest: Effort normal and breath sounds normal. No stridor. No respiratory distress. She has no wheezes. She has no rales.  Abdominal: Soft. Bowel sounds are normal.  Musculoskeletal: She exhibits edema.  Skin: No erythema.  Vitals reviewed.         Assessment & Plan:  Leg swelling - Plan: Brain natriuretic peptide, BASIC METABOLIC PANEL WITH GFR  Edema symmetric.  There is  no erythema.  There is no evidence of a DVT or cellulitis.  Patient does not appear to be fluid overloaded and I do not believe this is acute CHF.  I will check a BMP and a BNP.  Start the patient  on Lasix 40 mg a day and reassess the patient on Monday.

## 2017-12-02 LAB — BASIC METABOLIC PANEL WITH GFR
BUN: 11 mg/dL (ref 7–25)
CO2: 28 mmol/L (ref 20–32)
Calcium: 8.7 mg/dL (ref 8.6–10.4)
Chloride: 108 mmol/L (ref 98–110)
Creat: 0.76 mg/dL (ref 0.60–0.88)
GFR, Est African American: 82 mL/min/{1.73_m2} (ref >=60)
GFR, Est Non African American: 71 mL/min/{1.73_m2} (ref >=60)
Glucose, Bld: 229 mg/dL — ABNORMAL HIGH (ref 65–99)
Potassium: 3.5 mmol/L (ref 3.5–5.3)
Sodium: 142 mmol/L (ref 135–146)

## 2017-12-02 LAB — BRAIN NATRIURETIC PEPTIDE: Brain Natriuretic Peptide: 112 pg/mL — ABNORMAL HIGH (ref <100)

## 2017-12-05 ENCOUNTER — Encounter: Payer: Self-pay | Admitting: Family Medicine

## 2017-12-05 ENCOUNTER — Ambulatory Visit: Payer: Medicare Other | Admitting: Family Medicine

## 2017-12-05 VITALS — BP 140/74 | HR 68 | Temp 97.9°F | Resp 16 | Ht 64.0 in | Wt 142.0 lb

## 2017-12-05 DIAGNOSIS — M7989 Other specified soft tissue disorders: Secondary | ICD-10-CM

## 2017-12-05 NOTE — Progress Notes (Signed)
Subjective:    Patient ID: Rebecca Perkins, female    DOB: [birth year], 84 y.o.   MRN: [id number]  HPI 12/01/17 Patient presents with +1 pitting edema in both legs distal to the knee.  She complains of heaviness and tightness in her legs.  She denies any shortness of breath, orthopnea, paroxysmal nocturnal dyspnea, cough.  She denies any chest pain.  She denies any recent increase in sodium intake.  She has a negative Homans sign.  There is no erythema.  At that time, my plan was: Edema symmetric.  There is no erythema.  There is no evidence of a DVT or cellulitis.  Patient does not appear to be fluid overloaded and I do not believe this is acute CHF.  I will check a BMP and a BNP.  Start the patient on Lasix 40 mg a day and reassess the patient on Monday.  12/05/17 Patient lost 5 pounds of fluid over the weekend.  The edema has completely resolved in her legs.  Her BNP was mildly elevated however her echocardiogram in February showed ejection fraction of 60 to 65%.  Therefore I do not believe she has systolic failure.  She denies any cough, chest pain, or shortness of breath. Past Medical History:  Diagnosis Date  . Anemia   . Diabetes mellitus type 2, insulin dependent ([place])   . Dyslipidemia   . GERD (gastroesophageal reflux disease)   . Hypertension   . Migraines   . Osteoporosis   . PVD (peripheral vascular disease) ([place])    abi .83 (L), .92 (R)  . RSD (reflex sympathetic dystrophy) 2007   R wrist/hand following fx  . TIA (transient ischemic attack)   . Varicose veins    Past Surgical History:  Procedure Laterality Date  . ABDOMINAL HYSTERECTOMY  1988  . APPENDECTOMY  1966  . BREAST CYST EXCISION    . CATARACT EXTRACTION  10/2009  . CHOLECYSTECTOMY  1989  . Several benign cyst removed     last 1 in 1972  . TUBAL LIGATION    . UMBILICAL HERNIA REPAIR     Current Outpatient Medications on File Prior to Visit  Medication Sig Dispense Refill  . Blood Glucose Monitoring Suppl  (ONE TOUCH ULTRA SYSTEM KIT) W/DEVICE KIT 1 kit by Does not apply route once. Tests Blood sugar before meals and at bedtime 1 each 0  . Calcium Carbonate-Vitamin D (CALTRATE 600+D PO) Take by mouth 2 (two) times daily.    . clopidogrel (PLAVIX) 75 MG tablet Take 1 tablet (75 mg total) by mouth daily. 30 tablet 0  . Coenzyme Q10 (COQ10 PO) Take by mouth.    . Continuous Blood Gluc Sensor (FREESTYLE LIBRE 14 DAY SENSOR) MISC 1 Units by Does not apply route every 14 (fourteen) days. 2 each 4  . ferrous sulfate 325 (65 FE) MG tablet Take 1 tablet (325 mg total) by mouth 3 (three) times daily with meals. (Patient taking differently: Take 325 mg by mouth 2 (two) times daily with a meal. ) 90 tablet 4  . furosemide (LASIX) 40 MG tablet Take 1 tablet (40 mg total) by mouth daily. 30 tablet 0  . Icosapent Ethyl (VASCEPA) 1 g CAPS Take 2 capsules (2 g total) by mouth 2 (two) times daily. 360 capsule 3  . Insulin Glargine (TOUJEO MAX SOLOSTAR) 300 UNIT/ML SOPN Inject 38 Units into the skin every morning.     . insulin lispro (HUMALOG KWIKPEN) 100 UNIT/ML KiwkPen Use only if BS  over 150 in am 15 mL 1  . Insulin Pen Needle (B-D ULTRAFINE III SHORT PEN) 31G X 8 MM MISC USE AS DIRECTED TO INJECT TOUJEO DAILY 100 each 5  . INSULIN SYRINGE 1CC/29G 29G X 1/2" 1 ML MISC AS DIRECTED. 100 each 3  . losartan (COZAAR) 100 MG tablet TAKE 1 TABLET BY MOUTH EVERY DAY (Patient taking differently: TAKE 1/2 TABLET BY MOUTH EVERY DAY) 90 tablet 3  . metoprolol (LOPRESSOR) 50 MG tablet TAKE 1/2 TABLET  BY MOUTH TWICE DAILY 180 tablet 0  . Multiple Vitamin (MULTIVITAMIN) tablet Take 1 tablet by mouth daily.      Marland Kitchen omeprazole (PRILOSEC) 20 MG capsule TAKE ONE CAPSULE BY MOUTH DAILY 90 capsule 3  . ONE TOUCH ULTRA TEST test strip USE FOUR TIMES DAILY BEFORE MEALS AND EVERY NIGHT AT BEDTIME 150 each 5  . Pyridoxine HCl (VITAMIN B-6) 100 MG tablet Take 100 mg by mouth daily.      . rosuvastatin (CRESTOR) 20 MG tablet Take 1 tablet  (20 mg total) by mouth daily. 90 tablet 3  . TRULICITY 8.10 FB/5.1WC SOPN INJECT INTO THE ABDOMINAL SKIN ONCE WEEKLY 2 mL 3   Current Facility-Administered Medications on File Prior to Visit  Medication Dose Route Frequency Provider Last Rate Last Dose  . denosumab (PROLIA) injection 60 mg  60 mg Subcutaneous Q6 months [name], MD   60 mg at 10/21/17 1030   Allergies  Allergen Reactions  . Lipitor [Atorvastatin Calcium] Diarrhea  . Niacin And Related Hives  . Statins Diarrhea  . Macrobid [Nitrofurantoin Macrocrystal] Other (See Comments)    unknown   Social History   Socioeconomic History  . Marital status: Widowed    Spouse name: Not on file  . Number of children: 3  . Years of education: Not on file  . Highest education level: Not on file  Occupational History  . Occupation: Retired  Scientific laboratory technician  . Financial resource strain: Not on file  . Food insecurity:    Worry: Not on file    Inability: Not on file  . Transportation needs:    Medical: Not on file    Non-medical: Not on file  Tobacco Use  . Smoking status: Never Smoker  . Smokeless tobacco: Never Used  Substance and Sexual Activity  . Alcohol use: No  . Drug use: No  . Sexual activity: Not on file  Lifestyle  . Physical activity:    Days per week: Not on file    Minutes per session: Not on file  . Stress: Not on file  Relationships  . Social connections:    Talks on phone: Not on file    Gets together: Not on file    Attends religious service: Not on file    Active member of club or organization: Not on file    Attends meetings of clubs or organizations: Not on file    Relationship status: Not on file  . Intimate partner violence:    Fear of current or ex partner: Not on file    Emotionally abused: Not on file    Physically abused: Not on file    Forced sexual activity: Not on file  Other Topics Concern  . 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      Objective:   Physical Exam  Constitutional: She appears well-developed and well-nourished.  Neck: No JVD present.  Cardiovascular: Normal rate and regular rhythm.  Pulmonary/Chest: Effort normal and breath sounds normal. No stridor. No respiratory distress. She has no wheezes. She has no rales.  Abdominal: Soft. Bowel sounds are normal.  Musculoskeletal: She exhibits no edema.  Skin: No erythema.  Vitals reviewed.         Assessment & Plan:  Leg swelling Discontinue Lasix to avoid dehydration and monitor the patient clinically.

## 2017-12-12 ENCOUNTER — Encounter: Payer: Self-pay | Admitting: Adult Health

## 2017-12-12 ENCOUNTER — Ambulatory Visit: Payer: Medicare Other | Admitting: Adult Health

## 2017-12-12 VITALS — BP 176/76 | HR 66 | Ht 64.0 in | Wt 142.1 lb

## 2017-12-12 DIAGNOSIS — R29818 Other symptoms and signs involving the nervous system: Secondary | ICD-10-CM | POA: Diagnosis not present

## 2017-12-12 NOTE — Progress Notes (Signed)
[facility]  PATIENT: Rebecca Perkins DOB: [birth year]   REASON FOR VISIT: Follow-up recent hospitalization with complaints of dysarthria HISTORY FROM: patient     HISTORY OF PRESENT ILLNESS:  12/12/17 UPDATE: Patient was seen 11/09/17 after having episodes of slurred speech and difficulty finding her words which lasted for approximately 45 minutes and then resolved.  CT scan and MRI reviewed is negative for acute infarcts.  It was recommended the patient stop aspirin '325mg'$  and start Plavix at that time. It was recommended that patient patient was discharged in stable condition.  Patient is being seen today for follow-up appointment is okay by her son.  Patient denies recurrent dysarthria/aphasic symptoms.  During these episodes of dysarthria/aphasia, patient is unaware of speech difficulties and she states she lives by herself and was not aware until her son came over.  She does not lose consciousness during these events, his full knowledge of surroundings, and denies vision changes.  Patient states that she has had mild headaches during these episodes and does have a history of migraines.  Denies aura or transient neurologic symptoms.  Her past migraines were accompanied by photophobia.  Her headaches are located over her eyes and eyebrows and is unsure how often these happen.  She states that she will take Tylenol at times this helps but other times they can last for hours.  She does live alone and is able to do all ADLs without complications.  These headaches are not debilitating.  At recent appointment PCP, it was being considered to start patient on Xarelto in addition to aspirin 81 mg "who have a history of coronary artery disease, peripheral vascular disease, that showed reduction in mortality mainly from the prevention of strokes when compared to continuing antiplatelet therapy alone".  At this time patient is continuing to take Plavix with mild bruising but no bleeding.   Patient also continues to take Crestor without side effects no myalgia or GI symptoms such as diarrhea as she has had in the past.   Visit 11/08/17 JV: Recent hospital admission for TIA on 09/04/17 secondary to SVD.  Patient came in with right arm weakness and expressive aphasia where the symptoms resolved spontaneously and approximately 45 minutes.  CT scan reviewed and showed no acute intracranial abnormality.  MRI brain reviewed and negative for acute stroke.  MRA of the head showed 50-75% stenosis in the mid basilar, 50% stenosis in left PCA P1/P2 junction and severe stenosis P2/P3 segment in the right PCA.  Bilateral carotid ultrasound showed bilateral stenosis of 1-39%.  2D echo showed an EF of 60-65%.  LDL 149 and A1c 8.4.  Patient has previously been on Lipitor for 6 months but had to stop it due to leg cramps and leg weakness.  She tried Crestor for a few weeks but had to stop due to diarrhea.  She has been taking Lovaza and co-Q10 daily.  After this admission she was also started on Zetia.  Recommended to consider PCS2 inhibitors such as Repatha or Praluent in the outpatient clinic.  She was also discharged on aspirin 81 mg and Plavix for 3 weeks total and then to discontinue Plavix and continue aspirin with an increase to 325 mg daily.  Patient was discharged home in stable condition. Since discharge, patient has been doing well.  She has been taking Crestor 40 mg is been tolerating this well without any side effects or myalgias.  At hospital discharge was recommended for patient to take Zetia and continue Lovaza  but patient is taking neither and only Crestor at this time.  Most recent LDL 52 on 10/21/2017.  Continues to take aspirin without side effects of bleeding or bruising.  Blood pressures today's visit elevated at 179/78 but does states she is feeling anxious prior to appointment as she was already running late and had a lot of traffic on the way in.  She does check her blood pressure at home and  typically runs SBP 150s.  SBP advised the patient that blood pressure should be less than 130/90 and advised her to continue to monitor this at home and if blood pressure remains elevated to follow-up with PCP.  Patient does have endocrinologist for diabetic management.  Denies any recent headaches or new or worsening stroke/TIA symptoms.  Patient continues to live alone since her husband passed 2 years ago by her grandson and son check on her daily.  Denies sleep apnea symptoms such as snoring, daytime fatigue or frequent napping.  Denies interest in PREMIERS trial.  Update 09/01/2016 CM: Rebecca Perkins, 84 year old female returns for follow-up with history of TIA in November 2016. She is currently on Plavix without further TIA symptoms, no bruising and no bleeding. Her symptoms at that time was inability to speak or get her words out, no recurrence. She has risk factors of hypertension, blood pressure reading in office today 140/60. She is also insulin-dependent diabetic with most recent hemoglobin A1c 9.9. She complains with some numbness in her feet which may be a pearly manifestation of peripheral neuropathy from her diabetes. She had an increase in her insulin dosage. Her LDL was unable to be calculated because her triglycerides were 564. Her most recent labs were drawn on 08/30/2016. She has refused statin drugs in the past. She continues to walk to the mailbox for exercise but otherwise gets very little. She remains independent in activities of daily living and she continues to drive without difficulty She returns for reevaluation.  History 07/31/2017CM Rebecca Perkins, 84 year old female returns for follow-up. She has a history of hospital admission for TIA in November 2016. She was unable to speak or get her words out for about 15 minutes and then her symptoms resolved she was started on Plavix. Most recent hemoglobin A1c 8.5 managed by Dr. Dwyane Dee. Lipid profile in April with triglycerides 707 unable to  calculate a LDL. She was started back on gemfibrozil. Her diabetes and cholesterol have  remained poorly controlled. She has refused take statin drugs. She walks to the mailbox for exercise. Otherwise she gets little exercise she returns for reevaluation  HISTORY 09/01/15 PS86 year Caucasian lady seen today for first office follow-up visit following hospital admission for TIA in November 2016.Rebecca Perkins is an 84 y.o. female with a 15 min episode of unable to speak the correct words out, winessed by her family. No other focal neuro symptoms at that time. Sx resolved in 10-15 min.Patient was not administered TPA secondary to resolved deficits. She was admitted for further evaluation and treatment. CT scan of the head on admission showed only chronic microvascular disease and MRI scan showed no acute infarct. Old lacunar infarcts noted in the left basal ganglia and thalamus. MRA of the brain showed no large vessel occlusion or stenosis. Carotid ultrasound showed no significant extracranial stenosis. Transthoracic echo showed ejection fraction of 50-55% without cardiac source of embolism. LDL cholesterol was 81 mg percent. Last hemoglobin A1c on 08/22/15 was 8.7. While lipid profile showed total cholesterol 208, triglycerides 372 HDL 32 and LDL 102  mg percent on 08/22/15. She was started on Plavix which is tolerating well with only minor bruising. She however has not yet started protocol during she has filled the prescription and plans to do so. She does have a prior history of statin myalgias.  REVIEW OF SYSTEMS: Full 14 system review of systems performed and notable only for those listed, all others are neg:  No complaints    ALLERGIES: Allergies  Allergen Reactions  . Lipitor [Atorvastatin Calcium] Diarrhea  . Niacin And Related Hives  . Statins Diarrhea  . Macrobid [Nitrofurantoin Macrocrystal] Other (See Comments)    unknown    HOME MEDICATIONS: Outpatient Medications Prior to Visit    Medication Sig Dispense Refill  . Blood Glucose Monitoring Suppl (ONE TOUCH ULTRA SYSTEM KIT) W/DEVICE KIT 1 kit by Does not apply route once. Tests Blood sugar before meals and at bedtime 1 each 0  . Calcium Carbonate-Vitamin D (CALTRATE 600+D PO) Take by mouth 2 (two) times daily.    . clopidogrel (PLAVIX) 75 MG tablet Take 1 tablet (75 mg total) by mouth daily. 30 tablet 0  . Coenzyme Q10 (COQ10 PO) Take by mouth.    . Continuous Blood Gluc Sensor (FREESTYLE LIBRE 14 DAY SENSOR) MISC 1 Units by Does not apply route every 14 (fourteen) days. 2 each 4  . ferrous sulfate 325 (65 FE) MG tablet Take 1 tablet (325 mg total) by mouth 3 (three) times daily with meals. (Patient taking differently: Take 325 mg by mouth 2 (two) times daily with a meal. ) 90 tablet 4  . furosemide (LASIX) 40 MG tablet Take 1 tablet (40 mg total) by mouth daily. 30 tablet 0  . Icosapent Ethyl (VASCEPA) 1 g CAPS Take 2 capsules (2 g total) by mouth 2 (two) times daily. 360 capsule 3  . Insulin Glargine (TOUJEO MAX SOLOSTAR) 300 UNIT/ML SOPN Inject 34 Units into the skin every morning.     . insulin lispro (HUMALOG KWIKPEN) 100 UNIT/ML KiwkPen Use only if BS over 150 in am 15 mL 1  . Insulin Pen Needle (B-D ULTRAFINE III SHORT PEN) 31G X 8 MM MISC USE AS DIRECTED TO INJECT TOUJEO DAILY 100 each 5  . INSULIN SYRINGE 1CC/29G 29G X 1/2" 1 ML MISC AS DIRECTED. 100 each 3  . losartan (COZAAR) 100 MG tablet TAKE 1 TABLET BY MOUTH EVERY DAY (Patient taking differently: TAKE 1/2 TABLET BY MOUTH EVERY DAY) 90 tablet 3  . metoprolol (LOPRESSOR) 50 MG tablet TAKE 1/2 TABLET  BY MOUTH TWICE DAILY 180 tablet 0  . Multiple Vitamin (MULTIVITAMIN) tablet Take 1 tablet by mouth daily.      Marland Kitchen omeprazole (PRILOSEC) 20 MG capsule TAKE ONE CAPSULE BY MOUTH DAILY 90 capsule 3  . ONE TOUCH ULTRA TEST test strip USE FOUR TIMES DAILY BEFORE MEALS AND EVERY NIGHT AT BEDTIME 150 each 5  . Pyridoxine HCl (VITAMIN B-6) 100 MG tablet Take 100 mg by mouth  daily.      . rosuvastatin (CRESTOR) 20 MG tablet Take 1 tablet (20 mg total) by mouth daily. 90 tablet 3  . TRULICITY 1.61 WR/6.0AV SOPN INJECT INTO THE ABDOMINAL SKIN ONCE WEEKLY 2 mL 3   Facility-Administered Medications Prior to Visit  Medication Dose Route Frequency Provider Last Rate Last Dose  . denosumab (PROLIA) injection 60 mg  60 mg Subcutaneous Q6 months [name], MD   60 mg at 10/21/17 1030    PAST MEDICAL HISTORY: Past Medical History:  Diagnosis Date  .  Anemia   . Diabetes mellitus type 2, insulin dependent ([place])   . Dyslipidemia   . GERD (gastroesophageal reflux disease)   . Hypertension   . Migraines   . Osteoporosis   . PVD (peripheral vascular disease) ([place])    abi .83 (L), .92 (R)  . RSD (reflex sympathetic dystrophy) 2007   R wrist/hand following fx  . Stroke ([place])   . TIA (transient ischemic attack)   . Varicose veins     PAST SURGICAL HISTORY: Past Surgical History:  Procedure Laterality Date  . ABDOMINAL HYSTERECTOMY  1988  . APPENDECTOMY  1966  . BREAST CYST EXCISION    . CATARACT EXTRACTION  10/2009  . CHOLECYSTECTOMY  1989  . Several benign cyst removed     last 1 in 1972  . TUBAL LIGATION    . UMBILICAL HERNIA REPAIR      FAMILY HISTORY: Family History  Problem Relation Age of Onset  . Stroke Mother 27  . Hypertension Mother   . Clotting disorder Father   . Heart attack Father   . Arrhythmia Sister   . Stroke Brother   . Diabetes Neg Hx   . Thyroid disease Neg Hx     SOCIAL HISTORY: Social History   Socioeconomic History  . Marital status: Widowed    Spouse name: Not on file  . Number of children: 3  . Years of education: Not on file  . Highest education level: Not on file  Occupational History  . Occupation: Retired  Scientific laboratory technician  . Financial resource strain: Not on file  . Food insecurity:    Worry: Not on file    Inability: Not on file  . Transportation needs:    Medical: Not on file    Non-medical: Not on  file  Tobacco Use  . Smoking status: Never Smoker  . Smokeless tobacco: Never Used  Substance and Sexual Activity  . Alcohol use: No  . Drug use: No  . Sexual activity: Not on file  Lifestyle  . Physical activity:    Days per week: Not on file    Minutes per session: Not on file  . Stress: Not on file  Relationships  . Social connections:    Talks on phone: Not on file    Gets together: Not on file    Attends religious service: Not on file    Active member of club or organization: Not on file    Attends meetings of clubs or organizations: Not on file    Relationship status: Not on file  . Intimate partner violence:    Fear of current or ex partner: Not on file    Emotionally abused: Not on file    Physically abused: Not on file    Forced sexual activity: Not on file  Other Topics Concern  . 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 General: Pleasant frail Caucasian female, Caucasian lady, seated, in no evident distress Head: head normocephalic and atraumatic.  Neck: supple with no carotid  bruits Cardiovascular: regular rate and rhythm, no murmurs Musculoskeletal: no deformity Skin:  no rash/petichiae  Neurological  examination  Mental Status: Awake and fully alert. Oriented to place and time.  Attention span, concentration and fund of knowledge appropriate. Mood and affect appropriate.  Cranial Nerves: Pupils equal, briskly reactive to light. Extraocular movements full without nystagmus. Visual fields full to confrontation. Hearing intact. Facial sensation intact. Face, tongue, palate moves normally and symmetrically.  Motor: Normal bulk and tone. Normal strength in all tested  extremity muscles.  Mild weakness in right lower extremity Sensory.:  Decreased sensation right lower extremity compared to left lower extremity Coordination: Rapid alternating movements normal in all extremities. Finger-to-nose and heel-to-shin performed accurately bilaterally. Gait and Station: Arises from chair without difficulty. Stance is normal. Gait demonstrates normal stride length and balance . Unable to heel, toe and tandem walk without difficulty. No assistive device Reflexes: 1+ and symmetric. Toes downgoing.   DIAGNOSTIC DATA (LABS, IMAGING, TESTING) - I reviewed patient records, labs, notes, testing and imaging myself where available.  Lab Results  Component Value Date   WBC 6.5 11/09/2017   HGB 11.9 (L) 11/10/2017   HCT 35.0 (L) 11/10/2017   MCV 85.9 11/09/2017   PLT 168 11/09/2017      Component Value Date/Time   NA 142 12/01/2017 1634   K 3.5 12/01/2017 1634   CL 108 12/01/2017 1634   CO2 28 12/01/2017 1634   GLUCOSE 229 (H) 12/01/2017 1634   BUN 11 12/01/2017 1634   CREATININE 0.76 12/01/2017 1634   CALCIUM 8.7 12/01/2017 1634   PROT 6.9 11/09/2017 2358   ALBUMIN 3.6 11/09/2017 2358   AST 25 11/09/2017 2358   ALT 16 11/09/2017 2358   ALKPHOS 70 11/09/2017 2358   BILITOT 0.6 11/09/2017 2358   GFRNONAA 71 12/01/2017 1634   GFRAA 82 12/01/2017 1634   Lab Results  Component Value Date   CHOL 119 10/21/2017   HDL 33 (L) 10/21/2017   LDLCALC 52 10/21/2017   LDLDIRECT 41.8 09/13/2011   TRIG 285 (H) 10/21/2017   CHOLHDL 3.6 10/21/2017   Lab Results  Component Value Date   HGBA1C 8.4 (H) 09/05/2017   Lab Results  Component Value Date   VITAMINB12 1,072 (H) 12/10/2013   Lab Results  Component Value Date   TSH 1.57 06/27/2017      ASSESSMENT 42 year lady with TIA versus complicated migraine versus seizure on 11/09/2017. Vascular risk factors include previous TIAs, DM, HLD, and HTN.    PLAN  -Continue Plavix and Crestor for secondary stroke  prevention -Advised patient that we will not be starting Xarelto as patient does not have a history of atrial fibrillation or continuous ischemic infarcts. Seizure activity and complicated migraine need to be ruled out for her dysarthria/aphasic episodes.  -Referral for EEG to rule out seizure activity -Consider starting Topamax in the future for migraine control -patient wanted to hold off at this time until she underwent EEG and monitoring -Advised patient to keep a log of headaches to more accurately characterize and pattern these -f/u PCP regarding HLD and HTN management -f/u endocrinologist for diabetic management -Maintain strict control of hypertension with blood pressure goal below 130/90, diabetes with hemoglobin A1c goal below 6.5% and cholesterol with LDL cholesterol (bad cholesterol) goal below 70 mg/dL. I also advised the patient to eat a healthy diet with plenty of whole grains, cereals, fruits and vegetables, exercise regularly and maintain ideal body weight.  Followup in the future with me in 2 months or call earlier if needed   Greater than 50% time during this 25 minute consultation  visit was spent on counseling and coordination of care about HLD, HTN and DM (risk factors), discussion about risk benefit of anticoagulation and answering questions.   Venancio Poisson, AGNP-BC  [facility] [address] [place], [place] [address]  Phone [phone] Fax [phone]

## 2017-12-12 NOTE — Patient Instructions (Signed)
Continue clopidogrel 75 mg daily  and crestor  for secondary stroke prevention  Continue to follow up with PCP regarding blood pressure and cholesterol management   Continue to monitor blood pressure at home  Schedule EEG to rule out seizure activity  Maintain strict control of hypertension with blood pressure goal below 130/90, diabetes with hemoglobin A1c goal below 6.5% and cholesterol with LDL cholesterol (bad cholesterol) goal below 70 mg/dL. I also advised the patient to eat a healthy diet with plenty of whole grains, cereals, fruits and vegetables, exercise regularly and maintain ideal body weight.  Followup in the future with me in 2 months or call earlier if needed       Thank you for coming to see [place] at [facility]. I hope we have been able to provide you high quality care today.  You may receive a patient satisfaction survey over the next few weeks. We would appreciate your feedback and comments so that we may continue to improve ourselves and the health of our patients.

## 2017-12-12 NOTE — Progress Notes (Signed)
I agree with the above plan 

## 2017-12-13 ENCOUNTER — Other Ambulatory Visit: Payer: Self-pay | Admitting: Endocrinology

## 2018-01-10 ENCOUNTER — Ambulatory Visit: Payer: Medicare Other

## 2018-01-10 DIAGNOSIS — R4701 Aphasia: Secondary | ICD-10-CM

## 2018-01-11 ENCOUNTER — Ambulatory Visit: Payer: Medicare Other | Admitting: Endocrinology

## 2018-01-24 ENCOUNTER — Encounter: Payer: Self-pay | Admitting: Family Medicine

## 2018-01-24 ENCOUNTER — Ambulatory Visit (INDEPENDENT_AMBULATORY_CARE_PROVIDER_SITE_OTHER): Payer: Medicare Other | Admitting: Family Medicine

## 2018-01-24 VITALS — BP 144/86 | HR 68 | Temp 97.9°F | Resp 16 | Ht 64.0 in | Wt 140.0 lb

## 2018-01-24 DIAGNOSIS — R21 Rash and other nonspecific skin eruption: Secondary | ICD-10-CM

## 2018-01-24 MED ORDER — TRIAMCINOLONE ACETONIDE 0.025 % EX CREA: 1.0000 "application " | TOPICAL_CREAM | Freq: Two times a day (BID) | CUTANEOUS | 0 refills | Status: DC | PRN

## 2018-01-24 MED ORDER — CETIRIZINE HCL 10 MG PO TABS: 10.0000 mg | ORAL_TABLET | Freq: Every day | ORAL | 0 refills | Status: DC

## 2018-01-24 NOTE — Progress Notes (Signed)
Patient ID: Rebecca Perkins, female    DOB: 21-Jun-1929, 84 y.o.   MRN: [id number]  PCP: [name], MD  Chief Complaint  Patient presents with  . Rash    Subjective:   Rebecca Perkins is a 84 y.o. female, presents to clinic with CC of itchy rash to her left forearm, neck and back.  She doesn't know what bit her.  Did not notice any mosquitos or bugs, but does not believe she got stung by waspe or anything like that. She has been itching for over one week.   Tried a cream from her daughter, tried it once, didn't help at all. No pain, spreading swelling    Patient Active Problem List   Diagnosis Date Noted  . HTN (hypertension) 09/04/2017  . GERD (gastroesophageal reflux disease) 09/04/2017  . Diabetes mellitus type 2, insulin dependent ([place]) 09/04/2017  . Anemia 09/04/2017  . HLD (hyperlipidemia) 09/04/2017  . CKD (chronic kidney disease) stage 3, GFR 30-59 ml/min ([facility]) 09/04/2017  . Aphasia 09/01/2015  . TIA (transient ischemic attack) 06/17/2015  . Osteoporosis 02/18/2015  . Normocytic anemia 12/13/2013  . Diabetes mellitus type II, uncontrolled ([place]) 12/10/2013  . Pneumonia 12/10/2013  . Sepsis ([place]) 12/09/2013  . Type 2 diabetes mellitus with insulin deficiency ([place])   . Hypertension   . Hypertriglyceridemia   . Dyslipidemia      Prior to Admission medications   Medication Sig Start Date End Date Taking? Authorizing Provider  Blood Glucose Monitoring Suppl (ONE TOUCH ULTRA SYSTEM KIT) W/DEVICE KIT 1 kit by Does not apply route once. Tests Blood sugar before meals and at bedtime 07/10/14  Yes [name], MD  Calcium Carbonate-Vitamin D (CALTRATE 600+D PO) Take by mouth 2 (two) times daily.   Yes [provider]  clopidogrel (PLAVIX) 75 MG tablet Take 1 tablet (75 mg total) by mouth daily. 11/10/17  Yes Delo, Nathaneil Canary, MD  Coenzyme Q10 (COQ10 PO) Take by mouth.   Yes [provider]  Continuous Blood Gluc Sensor (FREESTYLE LIBRE 14  DAY SENSOR) MISC 1 Units by Does not apply route every 14 (fourteen) days. 09/28/17  Yes Elayne Snare, MD  ferrous sulfate 325 (65 FE) MG tablet Take 1 tablet (325 mg total) by mouth 3 (three) times daily with meals. Patient taking differently: Take 325 mg by mouth 2 (two) times daily with a meal.  02/04/17  Yes Pickard, Cammie Mcgee, MD  Icosapent Ethyl (VASCEPA) 1 g CAPS Take 2 capsules (2 g total) by mouth 2 (two) times daily. 10/28/17  Yes [name], MD  Insulin Glargine (TOUJEO MAX SOLOSTAR) 300 UNIT/ML SOPN Inject 34 Units into the skin every morning.    Yes [provider]  insulin lispro (HUMALOG KWIKPEN) 100 UNIT/ML KiwkPen Use only if BS over 150 in am 09/05/17  Yes Vann, Jessica U, DO  Insulin Pen Needle (B-D ULTRAFINE III SHORT PEN) 31G X 8 MM MISC INJECT AS DIRECTED 4 TIMES DAILY 12/13/17  Yes Elayne Snare, MD  INSULIN SYRINGE 1CC/29G 29G X 1/2" 1 ML MISC AS DIRECTED. 07/09/15  Yes Elayne Snare, MD  losartan (COZAAR) 100 MG tablet TAKE 1 TABLET BY MOUTH EVERY DAY Patient taking differently: TAKE 1/2 TABLET BY MOUTH EVERY DAY 09/21/17  Yes [name], MD  metoprolol (LOPRESSOR) 50 MG tablet TAKE 1/2 TABLET  BY MOUTH TWICE DAILY 06/19/15  Yes Eulogio Bear U, DO  Multiple Vitamin (MULTIVITAMIN) tablet Take 1 tablet by mouth daily.  Yes [provider]  omeprazole (PRILOSEC) 20 MG capsule TAKE ONE CAPSULE BY MOUTH DAILY 07/15/17  Yes [name], MD  ONE TOUCH ULTRA TEST test strip USE FOUR TIMES DAILY BEFORE MEALS AND EVERY NIGHT AT BEDTIME 10/06/16  Yes [name], MD  Pyridoxine HCl (VITAMIN B-6) 100 MG tablet Take 100 mg by mouth daily.     Yes [provider]  rosuvastatin (CRESTOR) 20 MG tablet Take 1 tablet (20 mg total) by mouth daily. 09/09/17  Yes [name], MD  TRULICITY 1.32 GM/0.1UU SOPN INJECT INTO THE ABDOMINAL SKIN ONCE WEEKLY 11/14/17  Yes Elayne Snare, MD     Allergies  Allergen Reactions  . Lipitor [Atorvastatin Calcium]  Diarrhea  . Niacin And Related Hives  . Statins Diarrhea  . Macrobid [Nitrofurantoin Macrocrystal] Other (See Comments)    unknown     Family History  Problem Relation Age of Onset  . Stroke Mother 42  . Hypertension Mother   . Clotting disorder Father   . Heart attack Father   . Arrhythmia Sister   . Stroke Brother   . Diabetes Neg Hx   . Thyroid disease Neg Hx      Social History   Socioeconomic History  . Marital status: Widowed    Spouse name: Not on file  . Number of children: 3  . Years of education: Not on file  . Highest education level: Not on file  Occupational History  . Occupation: Retired  Scientific laboratory technician  . Financial resource strain: Not on file  . Food insecurity:    Worry: Not on file    Inability: Not on file  . Transportation needs:    Medical: Not on file    Non-medical: Not on file  Tobacco Use  . Smoking status: Never Smoker  . Smokeless tobacco: Never Used  Substance and Sexual Activity  . Alcohol use: No  . Drug use: No  . Sexual activity: Not on file  Lifestyle  . Physical activity:    Days per week: Not on file    Minutes per session: Not on file  . Stress: Not on file  Relationships  . Social connections:    Talks on phone: Not on file    Gets together: Not on file    Attends religious service: Not on file    Active member of club or organization: Not on file    Attends meetings of clubs or organizations: Not on file    Relationship status: Not on file  . Intimate partner violence:    Fear of current or ex partner: Not on file    Emotionally abused: Not on file    Physically abused: Not on file    Forced sexual activity: Not on file  Other Topics Concern  . 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  Negative for fever.  HENT: Negative.  Negative for  sore throat, trouble swallowing and voice change.   Eyes: Negative.   Respiratory: Negative.  Negative for shortness of breath.   Cardiovascular: Negative.   Gastrointestinal: Negative.  Negative for abdominal pain, diarrhea, nausea and vomiting.  Genitourinary: Negative.   Musculoskeletal: Negative.   Skin: Positive for rash. Negative for wound.  Allergic/Immunologic: Negative.   Neurological: Negative.   Hematological: Negative.  All other systems reviewed and are negative.      Objective:    Vitals:   01/24/18 1147  BP: (!) 144/86  Pulse: 68  Resp: 16  Temp: 97.9 F (36.6 C)  TempSrc: Oral  SpO2: 96%  Weight: 140 lb (63.5 kg)  Height: '5\' 4"'$  (1.626 m)      Physical Exam  Constitutional: She appears well-developed and well-nourished. No distress.  HENT:  Head: Normocephalic and atraumatic.  Nose: Nose normal.  Mouth/Throat: Oropharynx is clear and moist.  Eyes: Conjunctivae are normal. Right eye exhibits no discharge. Left eye exhibits no discharge.  Neck: No tracheal deviation present.  Cardiovascular: Normal rate and regular rhythm.  Pulmonary/Chest: Effort normal. No stridor. No respiratory distress. She has no wheezes. She has no rales.  Abdominal: Soft. She exhibits no distension.  Musculoskeletal: Normal range of motion.  Neurological: She is alert. She exhibits normal muscle tone. Coordination normal.  Skin: Skin is warm and dry. No rash noted. She is not diaphoretic.  Small 0.5 erythematous excoriated papule to posterior right neck, no edema, induration, or tenderness two larger erythematous papules to left forearm, non-tenderness to palpation  Psychiatric: She has a normal mood and affect. Her behavior is normal.  Nursing note and vitals reviewed.         Assessment & Plan:      ICD-10-CM   1. Rash and nonspecific skin eruption R21    Possible mosquito bites, no concern for cellulitis or spreading allergic reaction (ie no contact dermatitis) Tx  with zyrtec, can continue topical benadryl and rx'd topical steroid cream.  Return if worsening or spreading.    Delsa Grana, PA-C 01/24/18 12:11 PM

## 2018-01-24 NOTE — Patient Instructions (Signed)
Start taking zyrtec (or claritin or allegra) once at night to help with itching  Use the steroid cream and your home benadryl cream 1-2 x a day for itching (sparingly)  You can take benadryl pills (25 mg) by mouth every 6 hours as needed for severe itching, but it rash is spreading please call us and come to be rechecked.

## 2018-02-13 ENCOUNTER — Encounter: Payer: Self-pay | Admitting: Adult Health

## 2018-02-13 ENCOUNTER — Ambulatory Visit: Payer: Medicare Other | Admitting: Adult Health

## 2018-02-13 ENCOUNTER — Other Ambulatory Visit: Payer: Self-pay

## 2018-02-13 VITALS — BP 141/58 | HR 90 | Ht 64.0 in | Wt 142.6 lb

## 2018-02-13 DIAGNOSIS — E1165 Type 2 diabetes mellitus with hyperglycemia: Secondary | ICD-10-CM | POA: Diagnosis not present

## 2018-02-13 DIAGNOSIS — G459 Transient cerebral ischemic attack, unspecified: Secondary | ICD-10-CM | POA: Diagnosis not present

## 2018-02-13 DIAGNOSIS — I10 Essential (primary) hypertension: Secondary | ICD-10-CM | POA: Diagnosis not present

## 2018-02-13 DIAGNOSIS — Z794 Long term (current) use of insulin: Secondary | ICD-10-CM | POA: Diagnosis not present

## 2018-02-13 DIAGNOSIS — R413 Other amnesia: Secondary | ICD-10-CM

## 2018-02-13 DIAGNOSIS — IMO0001 Reserved for inherently not codable concepts without codable children: Secondary | ICD-10-CM

## 2018-02-13 DIAGNOSIS — E781 Pure hyperglyceridemia: Secondary | ICD-10-CM | POA: Diagnosis not present

## 2018-02-13 NOTE — Patient Instructions (Signed)
Continue clopidogrel 75 mg daily  and crestor  for secondary stroke prevention  We will do lab work to look for other causes of nightly confusion - we will also look at your urine to ensure you do not have UTI causes confusion  Continue to follow up with PCP and endocrinologist regarding cholesterol, diabetes and blood pressure management   Continue to monitor blood pressure at home  Maintain strict control of hypertension with blood pressure goal below 130/90, diabetes with hemoglobin A1c goal below 6.5% and cholesterol with LDL cholesterol (bad cholesterol) goal below 70 mg/dL. I also advised the patient to eat a healthy diet with plenty of whole grains, cereals, fruits and vegetables, exercise regularly and maintain ideal body weight.  Followup in the future with me in 2 months or call earlier if needed          Thank you for coming to see [place] at [facility]. I hope we have been able to provide you high quality care today.  You may receive a patient satisfaction survey over the next few weeks. We would appreciate your feedback and comments so that we may continue to improve ourselves and the health of our patients.

## 2018-02-13 NOTE — Progress Notes (Signed)
[facility]  PATIENT: Rebecca Perkins DOB: [birth year]   REASON FOR VISIT: Follow-up recent hospitalization with complaints of dysarthria HISTORY FROM: patient    HISTORY OF PRESENT ILLNESS: Visit 11/08/17 JV: Recent hospital admission for TIA on 09/04/17 secondary to SVD.  Patient came in with right arm weakness and expressive aphasia where the symptoms resolved spontaneously and approximately 45 minutes.  CT scan reviewed and showed no acute intracranial abnormality.  MRI brain reviewed and negative for acute stroke.  MRA of the head showed 50-75% stenosis in the mid basilar, 50% stenosis in left PCA P1/P2 junction and severe stenosis P2/P3 segment in the right PCA.  Bilateral carotid ultrasound showed bilateral stenosis of 1-39%.  2D echo showed an EF of 60-65%.  LDL 149 and A1c 8.4.  Patient has previously been on Lipitor for 6 months but had to stop it due to leg cramps and leg weakness.  She tried Crestor for a few weeks but had to stop due to diarrhea.  She has been taking Lovaza and co-Q10 daily.  After this admission she was also started on Zetia.  Recommended to consider PCS2 inhibitors such as Repatha or Praluent in the outpatient clinic.  She was also discharged on aspirin 81 mg and Plavix for 3 weeks total and then to discontinue Plavix and continue aspirin with an increase to 325 mg daily.  Patient was discharged home in stable condition.  Since discharge, patient has been doing well.  She has been taking Crestor 40 mg is been tolerating this well without any side effects or myalgias.  At hospital discharge was recommended for patient to take Zetia and continue Lovaza but patient is taking neither and only Crestor at this time.  Most recent LDL 52 on 10/21/2017.  Continues to take aspirin without side effects of bleeding or bruising.  Blood pressures today's visit elevated at 179/78 but does states she is feeling anxious prior to appointment as she was already running late  and had a lot of traffic on the way in.  She does check her blood pressure at home and typically runs SBP 150s.  SBP advised the patient that blood pressure should be less than 130/90 and advised her to continue to monitor this at home and if blood pressure remains elevated to follow-up with PCP.  Patient does have endocrinologist for diabetic management.  Denies any recent headaches or new or worsening stroke/TIA symptoms.  Patient continues to live alone since her husband passed 2 years ago by her grandson and son check on her daily.  Denies sleep apnea symptoms such as snoring, daytime fatigue or frequent napping.  Denies interest in PREMIERS trial.  12/12/17 visit: Patient was seen 11/09/17 after having episodes of slurred speech and difficulty finding her words which lasted for approximately 45 minutes and then resolved.  CT scan and MRI reviewed is negative for acute infarcts.  It was recommended the patient stop aspirin '325mg'$  and start Plavix at that time. It was recommended that patient patient was discharged in stable condition.  Patient is being seen today for follow-up appointment is okay by her son.  Patient denies recurrent dysarthria/aphasic symptoms.  During these episodes of dysarthria/aphasia, patient is unaware of speech difficulties and she states she lives by herself and was not aware until her son came over.  She does not lose consciousness during these events, his full knowledge of surroundings, and denies vision changes.  Patient states that she has had mild headaches during these episodes  and does have a history of migraines.  Denies aura or transient neurologic symptoms.  Her past migraines were accompanied by photophobia.  Her headaches are located over her eyes and eyebrows and is unsure how often these happen.  She states that she will take Tylenol at times this helps but other times they can last for hours.  She does live alone and is able to do all ADLs without complications.  These  headaches are not debilitating.  At recent appointment PCP, it was being considered to start patient on Xarelto in addition to aspirin 81 mg "who have a history of coronary artery disease, peripheral vascular disease, that showed reduction in mortality mainly from the prevention of strokes when compared to continuing antiplatelet therapy alone".  At this time patient is continuing to take Plavix with mild bruising but no bleeding.  Patient also continues to take Crestor without side effects no myalgia or GI symptoms such as diarrhea as she has had in the past.  02/13/18 UPDATE: Patient returns today for follow-up and is accompanied by both of her sons.  Patient did have a recent episode during the night of confusion and hallucinations with this lasted for approximately 12 hours and then subsided.  Patient feels like recently her long-term memory has been intact but her short-term memory has been worsening with increasing intermittent confusion.  She continues also complain of headaches that occur weekly that occur on the right side and radiate down to the back of her neck.  These headaches typically are not accompanied by photophobia, phonophobia or nausea and vomiting and are not debilitating.  Continues to take Plavix without side effects of bleeding or bruising.  Continues to take Crestor without side effects myalgias.  Blood pressure today satisfactory 141/58.  Due to memory complaints MMSE was performed with 27/30.  Patient continues to live on her own and doing all ADLs and most IADLs with some assistance from her grandson.  Denies new or worsening stroke/TIA symptoms.   REVIEW OF SYSTEMS: Full 14 system review of systems performed and notable only for those listed, all others are neg:  Confusion and headaches    ALLERGIES: Allergies  Allergen Reactions  . Lipitor [Atorvastatin Calcium] Diarrhea  . Niacin And Related Hives  . Statins Diarrhea  . Macrobid [Nitrofurantoin Macrocrystal] Other (See  Comments)    unknown    HOME MEDICATIONS: Outpatient Medications Prior to Visit  Medication Sig Dispense Refill  . Blood Glucose Monitoring Suppl (ONE TOUCH ULTRA SYSTEM KIT) W/DEVICE KIT 1 kit by Does not apply route once. Tests Blood sugar before meals and at bedtime 1 each 0  . Calcium Carbonate-Vitamin D (CALTRATE 600+D PO) Take by mouth 2 (two) times daily.    . cetirizine (ZYRTEC) 10 MG tablet Take 1 tablet (10 mg total) by mouth daily. 30 tablet 0  . clopidogrel (PLAVIX) 75 MG tablet Take 1 tablet (75 mg total) by mouth daily. 30 tablet 0  . Coenzyme Q10 (COQ10 PO) Take by mouth.    . Continuous Blood Gluc Sensor (FREESTYLE LIBRE 14 DAY SENSOR) MISC 1 Units by Does not apply route every 14 (fourteen) days. 2 each 4  . Icosapent Ethyl (VASCEPA) 1 g CAPS Take 2 capsules (2 g total) by mouth 2 (two) times daily. 360 capsule 3  . Insulin Glargine (TOUJEO MAX SOLOSTAR) 300 UNIT/ML SOPN Inject 34 Units into the skin every morning.     . insulin lispro (HUMALOG KWIKPEN) 100 UNIT/ML KiwkPen Use only if BS over  150 in am 15 mL 1  . Insulin Pen Needle (B-D ULTRAFINE III SHORT PEN) 31G X 8 MM MISC INJECT AS DIRECTED 4 TIMES DAILY 300 each 0  . INSULIN SYRINGE 1CC/29G 29G X 1/2" 1 ML MISC AS DIRECTED. 100 each 3  . losartan (COZAAR) 100 MG tablet TAKE 1 TABLET BY MOUTH EVERY DAY (Patient taking differently: TAKE 1/2 TABLET BY MOUTH EVERY DAY) 90 tablet 3  . metoprolol (LOPRESSOR) 50 MG tablet TAKE 1/2 TABLET  BY MOUTH TWICE DAILY 180 tablet 0  . Multiple Vitamin (MULTIVITAMIN) tablet Take 1 tablet by mouth daily.      Marland Kitchen omeprazole (PRILOSEC) 20 MG capsule TAKE ONE CAPSULE BY MOUTH DAILY 90 capsule 3  . ONE TOUCH ULTRA TEST test strip USE FOUR TIMES DAILY BEFORE MEALS AND EVERY NIGHT AT BEDTIME 150 each 5  . Pyridoxine HCl (VITAMIN B-6) 100 MG tablet Take 100 mg by mouth daily.      . rosuvastatin (CRESTOR) 20 MG tablet Take 1 tablet (20 mg total) by mouth daily. 90 tablet 3  . triamcinolone  (KENALOG) 0.025 % cream Apply 1 application topically 2 (two) times daily as needed. For itching, use sparingly 30 g 0  . TRULICITY 3.47 QQ/5.9DG SOPN INJECT INTO THE ABDOMINAL SKIN ONCE WEEKLY 2 mL 3  . ferrous sulfate 325 (65 FE) MG tablet Take 1 tablet (325 mg total) by mouth 3 (three) times daily with meals. (Patient not taking: Reported on 02/13/2018) 90 tablet 4   Facility-Administered Medications Prior to Visit  Medication Dose Route Frequency Provider Last Rate Last Dose  . denosumab (PROLIA) injection 60 mg  60 mg Subcutaneous Q6 months [name], MD   60 mg at 10/21/17 1030    PAST MEDICAL HISTORY: Past Medical History:  Diagnosis Date  . Anemia   . Diabetes mellitus type 2, insulin dependent ([place])   . Dyslipidemia   . GERD (gastroesophageal reflux disease)   . Hypertension   . Migraines   . Osteoporosis   . PVD (peripheral vascular disease) ([place])    abi .83 (L), .92 (R)  . RSD (reflex sympathetic dystrophy) 2007   R wrist/hand following fx  . Stroke ([place])   . TIA (transient ischemic attack)   . Varicose veins     PAST SURGICAL HISTORY: Past Surgical History:  Procedure Laterality Date  . ABDOMINAL HYSTERECTOMY  1988  . APPENDECTOMY  1966  . BREAST CYST EXCISION    . CATARACT EXTRACTION  10/2009  . CHOLECYSTECTOMY  1989  . Several benign cyst removed     last 1 in 1972  . TUBAL LIGATION    . UMBILICAL HERNIA REPAIR      FAMILY HISTORY: Family History  Problem Relation Age of Onset  . Stroke Mother 5  . Hypertension Mother   . Clotting disorder Father   . Heart attack Father   . Arrhythmia Sister   . Stroke Brother   . Diabetes Neg Hx   . Thyroid disease Neg Hx     SOCIAL HISTORY: Social History   Socioeconomic History  . Marital status: Widowed    Spouse name: Not on file  . Number of children: 3  . Years of education: Not on file  . Highest education level: Not on file  Occupational History  . Occupation: Retired  Scientific laboratory technician  .  Financial resource strain: Not on file  . Food insecurity:    Worry: Not on file    Inability: Not on  file  . Transportation needs:    Medical: Not on file    Non-medical: Not on file  Tobacco Use  . Smoking status: Never Smoker  . Smokeless tobacco: Never Used  Substance and Sexual Activity  . Alcohol use: No  . Drug use: No  . Sexual activity: Not on file  Lifestyle  . Physical activity:    Days per week: Not on file    Minutes per session: Not on file  . Stress: Not on file  Relationships  . Social connections:    Talks on phone: Not on file    Gets together: Not on file    Attends religious service: Not on file    Active member of club or organization: Not on file    Attends meetings of clubs or organizations: Not on file    Relationship status: Not on file  . Intimate partner violence:    Fear of current or ex partner: Not on file    Emotionally abused: Not on file    Physically abused: Not on file    Forced sexual activity: Not on file  Other Topics Concern  . 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 General: Pleasant frail Caucasian female, Caucasian lady, seated, in no evident distress Head: head normocephalic and atraumatic.  Neck: supple with no carotid  bruits Cardiovascular: regular rate and rhythm, no murmurs Musculoskeletal: no deformity Skin:  no rash/petichiae  Neurological examination  Mental Status: Awake and fully alert. Oriented to place and time.  Attention span, concentration and fund of knowledge appropriate. Mood and affect appropriate.  Cranial Nerves: Pupils equal, briskly reactive to light. Extraocular movements full without nystagmus.  Visual fields full to confrontation. Hearing intact. Facial sensation intact. Face, tongue, palate moves normally and symmetrically.  Motor: Normal bulk and tone. Normal strength in all tested extremity muscles.  Mild weakness in right lower extremity Sensory.:  Decreased sensation right lower extremity compared to left lower extremity Coordination: Rapid alternating movements normal in all extremities. Finger-to-nose and heel-to-shin performed accurately bilaterally. Gait and Station: Arises from chair without difficulty. Stance is normal. Gait demonstrates normal stride length and balance . Unable to heel, toe and tandem walk without difficulty. No assistive device Reflexes: 1+ and symmetric. Toes downgoing.   DIAGNOSTIC DATA (LABS, IMAGING, TESTING) - I reviewed patient records, labs, notes, testing and imaging myself where available.  EEG ADULT 01/11/18 Summary Normal electroencephalogram, awake, asleep and with activation procedures. There are no focal lateralizing or epileptiform features.   Lab Results  Component Value Date   WBC 6.5 11/09/2017   HGB 11.9 (L) 11/10/2017   HCT 35.0 (L) 11/10/2017   MCV 85.9 11/09/2017   PLT 168 11/09/2017      Component Value Date/Time   NA 142 12/01/2017 1634   K 3.5 12/01/2017 1634   CL 108 12/01/2017 1634   CO2 28 12/01/2017 1634   GLUCOSE 229 (H) 12/01/2017 1634   BUN 11 12/01/2017 1634   CREATININE 0.76 12/01/2017 1634   CALCIUM 8.7 12/01/2017 1634   PROT 6.9 11/09/2017 2358   ALBUMIN 3.6 11/09/2017 2358  AST 25 11/09/2017 2358   ALT 16 11/09/2017 2358   ALKPHOS 70 11/09/2017 2358   BILITOT 0.6 11/09/2017 2358   GFRNONAA 71 12/01/2017 1634   GFRAA 82 12/01/2017 1634   Lab Results  Component Value Date   CHOL 119 10/21/2017   HDL 33 (L) 10/21/2017   LDLCALC 52 10/21/2017   LDLDIRECT 41.8 09/13/2011   TRIG 285 (H) 10/21/2017   CHOLHDL 3.6 10/21/2017   Lab Results  Component Value Date   HGBA1C 8.4 (H) 09/05/2017   Lab  Results  Component Value Date   VITAMINB12 1,072 (H) 12/10/2013   Lab Results  Component Value Date   TSH 1.57 06/27/2017      ASSESSMENT 27 year lady with TIA versus complicated migraine versus seizure on 11/09/2017. Vascular risk factors include previous TIAs, DM, HLD, and HTN.    PLAN  -Continue Plavix and Crestor for secondary stroke prevention -Lab work to look for other causes of delirium and increased confusion -CBC, CMP, B12, TSH and UA -Long discussion with sons regarding possibility of this occurring again and it becomes frequent, other arrangements may need to be made such as a home health aide overnight or higher level of care -f/u PCP regarding HLD and HTN management -f/u endocrinologist for diabetic management -Maintain strict control of hypertension with blood pressure goal below 130/90, diabetes with hemoglobin A1c goal below 6.5% and cholesterol with LDL cholesterol (bad cholesterol) goal below 70 mg/dL. I also advised the patient to eat a healthy diet with plenty of whole grains, cereals, fruits and vegetables, exercise regularly and maintain ideal body weight.  Followup in the future with me in 2 months or call earlier if needed   Greater than 50% time during this 25 minute consultation visit was spent on counseling and coordination of care about HLD, HTN and DM (risk factors), discussion about risk benefit of anticoagulation and answering questions.   Venancio Poisson, AGNP-BC  [facility] [address] [place], [place] [address]  Phone [phone] Fax [phone]

## 2018-02-14 LAB — URINALYSIS, ROUTINE W REFLEX MICROSCOPIC
Bilirubin, UA: NEGATIVE
Ketones, UA: NEGATIVE
Nitrite, UA: POSITIVE — AB
RBC, UA: NEGATIVE
Specific Gravity, UA: 1.029 (ref 1.005–1.030)
Urobilinogen, Ur: 0.2 mg/dL (ref 0.2–1.0)
pH, UA: 5 (ref 5.0–7.5)

## 2018-02-14 LAB — COMPREHENSIVE METABOLIC PANEL
ALT: 16 IU/L (ref 0–32)
AST: 19 IU/L (ref 0–40)
Albumin/Globulin Ratio: 1.4 (ref 1.2–2.2)
Albumin: 3.9 g/dL (ref 3.5–4.7)
Alkaline Phosphatase: 70 IU/L (ref 39–117)
BUN/Creatinine Ratio: 17 (ref 12–28)
BUN: 12 mg/dL (ref 8–27)
Bilirubin Total: 0.3 mg/dL (ref 0.0–1.2)
CO2: 23 mmol/L (ref 20–29)
Calcium: 9.4 mg/dL (ref 8.7–10.3)
Chloride: 108 mmol/L — ABNORMAL HIGH (ref 96–106)
Creatinine, Ser: 0.69 mg/dL (ref 0.57–1.00)
GFR calc Af Amer: 91 mL/min/{1.73_m2} (ref >59)
GFR calc non Af Amer: 79 mL/min/{1.73_m2} (ref >59)
Globulin, Total: 2.8 g/dL (ref 1.5–4.5)
Glucose: 315 mg/dL — ABNORMAL HIGH (ref 65–99)
Potassium: 4 mmol/L (ref 3.5–5.2)
Sodium: 144 mmol/L (ref 134–144)
Total Protein: 6.7 g/dL (ref 6.0–8.5)

## 2018-02-14 LAB — MICROSCOPIC EXAMINATION: Casts: NONE SEEN /lpf

## 2018-02-14 LAB — VITAMIN B12: Vitamin B-12: 361 pg/mL (ref 232–1245)

## 2018-02-14 LAB — TSH: TSH: 1.82 u[IU]/mL (ref 0.450–4.500)

## 2018-02-14 NOTE — Progress Notes (Signed)
I agree with the above plan 

## 2018-02-15 ENCOUNTER — Telehealth: Payer: Self-pay | Admitting: *Deleted

## 2018-02-15 NOTE — Telephone Encounter (Signed)
Called and spoke with patient about lab results/UA. Relayed Jessica's message. She verbalized understanding.

## 2018-02-15 NOTE — Telephone Encounter (Signed)
-----   Message from Rebecca Poisson, NP sent at 02/15/2018  7:37 AM EDT ----- Please notify patient that all of her labs looked good without concern. Her urine did not show a UTI. Thank you.

## 2018-02-17 ENCOUNTER — Ambulatory Visit: Payer: Medicare Other | Admitting: Endocrinology

## 2018-02-17 ENCOUNTER — Encounter: Payer: Self-pay | Admitting: Endocrinology

## 2018-02-17 VITALS — BP 152/88 | HR 86 | Ht 64.0 in | Wt 141.2 lb

## 2018-02-17 DIAGNOSIS — Z794 Long term (current) use of insulin: Secondary | ICD-10-CM

## 2018-02-17 DIAGNOSIS — E1165 Type 2 diabetes mellitus with hyperglycemia: Secondary | ICD-10-CM | POA: Diagnosis not present

## 2018-02-17 LAB — POCT GLYCOSYLATED HEMOGLOBIN (HGB A1C): Hemoglobin A1C: 8.4 % — AB (ref 4.0–5.6)

## 2018-02-17 NOTE — Progress Notes (Signed)
Patient ID: Rebecca Perkins, female   DOB: 1929/01/30, 84 y.o.   MRN: [id number]            Reason for Appointment: Follow-up for Type 2 Diabetes  Referring physician: Pickard  History of Present Illness:          Diagnosis: Type 2 diabetes mellitus, date of diagnosis: 2006       Past history:  At the time of diagnosis she was feeling tired and weak but does not know what her initial blood sugar was. She probably was tried on metformin initially but because of GI side effects discussed not be continued Also did not tolerate Actos because of nausea. She thinks that she was started on insulin within a few months of her initial diagnosis  Not clear what insulin she was trying in the beginning but she thinks it was NovoLog; however for the last few years has been on Lantus only Her blood sugars have been persistently poorly controlled with A1c usually 8-9% about 2011 At initial consultation she was taking 60 units of Lantus insulin without any mealtime coverage On her visit in 3/17 because of her high A1c she was advised to start taking mealtime insulin at suppertime  In 09/2016 because of A1c being 9.9 she was told to start Victoza in addition to her basal bolus insulin regimen  Recent history:   INSULIN regimen is: 34 units Toujeo in a.m., Humalog: None  Non-insulin hypoglycemic drugs the patient is taking are: Trulicity 2.97 mg weekly:     Her previous A1c was 8.4 in February and is the same now  Current management, blood sugars and problems identified:   Her Toujeo was reduced by 4 units in April because of occasional morning hypoglycemia  Although her blood sugars are averaging only slightly more than on her last visit she has not had any significant hypoglycemia now with only 1% of her readings below 70  She has however variable degrees of postprandial hyperglycemia at various times with highest readings late afternoon and late evening  This is despite her saying that she is  not eating large portions but only has a relatively large breakfast  Blood sugars on average are mostly above her target of 180 and only improved her early morning and around 8-9 PM  However she does have fluctuation at all times especially late at night  She has been also very regular with her Trulicity every <[id number]>_8<\/JEHUDJSHFWYOVZCH>_8    %  % Time Above 180  52  % Time above 250   % Time Below target 1   Blood sugar patterns as above   Self-care: The diet that the patient has been following is: tries to limit portions .     Meals: 2-3 meals per day. Breakfast is variable, bacon, fruit, egg    Dinner 7 pm  She will snack on peanut butter crackers or some low-fat trail mix at lunch, sometimes will eat a sandwich at lunch            Exercise: none  Dietician visit, most recent none               Weight history: Highest weight has been 170  Wt Readings from Last 3 Encounters:  02/17/18 141 lb 3.2 oz (64 kg)  02/13/18 142 lb 9.6 oz (64.7 kg)  01/24/18 140 lb (63.5 kg)    Glycemic control:   Lab Results  Component Value Date   HGBA1C 8.4 (A) 02/17/2018   HGBA1C 8.4 (H) 09/05/2017   HGBA1C 7.9 06/27/2017   Lab Results  Component Value Date   MICROALBUR 7.6 09/02/2017   LDLCALC 52 10/21/2017   CREATININE 0.69 02/13/2018    Office Visit on 02/17/2018  Component Date Value Ref Range Status  . Hemoglobin A1C 02/17/2018 8.4* 4.0 - 5.6 % Final  Office Visit on 02/13/2018    Component Date Value Ref Range Status  . Glucose 02/13/2018 315* 65 - 99 mg/dL Final  . BUN 02/13/2018 12  8 - 27 mg/dL Final  . Creatinine, Ser 02/13/2018 0.69  0.57 - 1.00 mg/dL Final  . GFR calc non Af Amer 02/13/2018 79  >59 mL/min/1.73 Final  . GFR calc Af Amer 02/13/2018 91  >59 mL/min/1.73 Final  . BUN/Creatinine Ratio 02/13/2018 17  12 - 28 Final  . Sodium 02/13/2018 144  134 - 144 mmol/L Final  . Potassium 02/13/2018 4.0  3.5 - 5.2 mmol/L Final  . Chloride 02/13/2018 108* 96 - 106 mmol/L Final  . CO2 02/13/2018 23  20 - 29 mmol/L Final  . Calcium 02/13/2018 9.4  8.7 - 10.3 mg/dL Final  . Total Protein 02/13/2018 6.7  6.0 - 8.5 g/dL Final  . Albumin 02/13/2018 3.9  3.5 - 4.7 g/dL Final  . Globulin, Total 02/13/2018 2.8  1.5 - 4.5 g/dL Final  . Albumin/Globulin Ratio 02/13/2018 1.4  1.2 - 2.2 Final  . Bilirubin Total 02/13/2018 0.3  0.0 - 1.2 mg/dL Final  . Alkaline Phosphatase 02/13/2018 70  39 - 117 IU/L Final  . AST 02/13/2018 19  0 - 40 IU/L Final  . ALT 02/13/2018 16  0 - 32 IU/L Final  . Vitamin B-12 02/13/2018 361  232 - 1,245 pg/mL Final  . TSH 02/13/2018 1.820  0.450 - 4.500 uIU/mL Final  . Specific Gravity, UA 02/13/2018 1.029  1.005 - 1.030 Final  . pH, UA 02/13/2018 5.0  5.0 - 7.5 Final  . Color, UA 02/13/2018 Yellow  Yellow Final  . Appearance Ur 02/13/2018 Clear  Clear Final  . Leukocytes, UA 02/13/2018 1+* Negative Final  . Protein, UA 02/13/2018 Trace  Negative/Trace Final  . Glucose, UA 02/13/2018 3+* Negative Final  . Ketones, UA 02/13/2018 Negative  Negative Final  . RBC, UA 02/13/2018 Negative  Negative Final  . Bilirubin, UA 02/13/2018 Negative  Negative Final  . Urobilinogen, Ur 02/13/2018 0.2  0.2 - 1.0 mg/dL Final  . Nitrite, UA 02/13/2018 Positive* Negative Final  . Microscopic Examination 02/13/2018 See below:   Final   Microscopic was indicated and was performed.  . WBC, UA 02/13/2018 6-10* 0 - 5 /hpf Final  . RBC, UA 02/13/2018 0-2  0 - 2 /hpf  Final  . Epithelial Cells (non renal) 02/13/2018 0-10  0 - 10 /hpf Final  . Casts 02/13/2018 None seen  None seen /lpf Final  .  Crystals 02/13/2018 Present* N/A Final  . Crystal Type 02/13/2018 Calcium Oxalate  N/A Final  . Mucus, UA 02/13/2018 Present  Not Estab. Final  . Bacteria, UA 02/13/2018 Few  None seen/Few Final      Allergies as of 02/17/2018      Reactions   Lipitor [atorvastatin Calcium] Diarrhea   Niacin And Related Hives   Statins Diarrhea   Macrobid [nitrofurantoin Macrocrystal] Other (See Comments)   unknown      Medication List        Accurate as of 02/17/18  9:43 PM. Always use your most recent med list.          CALTRATE 600+D PO Take by mouth 2 (two) times daily.   clopidogrel 75 MG tablet Commonly known as:  PLAVIX Take 1 tablet (75 mg total) by mouth daily.   COQ10 PO Take by mouth.   ferrous sulfate 325 (65 FE) MG tablet Take 1 tablet (325 mg total) by mouth 3 (three) times daily with meals.   FREESTYLE LIBRE 14 DAY SENSOR Misc 1 Units by Does not apply route every 14 (fourteen) days.   Icosapent Ethyl 1 g Caps Commonly known as:  VASCEPA Take 2 capsules (2 g total) by mouth 2 (two) times daily.   insulin lispro 100 UNIT/ML KiwkPen Commonly known as:  HUMALOG KWIKPEN Use only if BS over 150 in am   Insulin Pen Needle 31G X 8 MM Misc Commonly known as:  B-D ULTRAFINE III SHORT PEN INJECT AS DIRECTED 4 TIMES DAILY   INSULIN SYRINGE 1CC/29G 29G X 1/2" 1 ML Misc AS DIRECTED.   losartan 100 MG tablet Commonly known as:  COZAAR TAKE 1 TABLET BY MOUTH EVERY DAY   metoprolol tartrate 50 MG tablet Commonly known as:  LOPRESSOR TAKE 1/2 TABLET  BY MOUTH TWICE DAILY   multivitamin tablet Take 1 tablet by mouth daily.   omeprazole 20 MG capsule Commonly known as:  PRILOSEC TAKE ONE CAPSULE BY MOUTH DAILY   ONE TOUCH ULTRA SYSTEM KIT w/Device Kit 1 kit by Does not apply route once. Tests Blood sugar before meals and at bedtime   ONE  TOUCH ULTRA TEST test strip Generic drug:  glucose blood USE FOUR TIMES DAILY BEFORE MEALS AND EVERY NIGHT AT BEDTIME   pyridOXINE 100 MG tablet Commonly known as:  VITAMIN B-6 Take 100 mg by mouth daily.   rosuvastatin 20 MG tablet Commonly known as:  CRESTOR Take 1 tablet (20 mg total) by mouth daily.   TOUJEO MAX SOLOSTAR 300 UNIT/ML Sopn Generic drug:  Insulin Glargine Inject 34 Units into the skin every morning.   TRULICITY 9.24 QA/8.3MH Sopn Generic drug:  Dulaglutide INJECT INTO THE ABDOMINAL SKIN ONCE WEEKLY       Allergies:  Allergies  Allergen Reactions  . Lipitor [Atorvastatin Calcium] Diarrhea  . Niacin And Related Hives  . Statins Diarrhea  . Macrobid [Nitrofurantoin Macrocrystal] Other (See Comments)    unknown    Past Medical History:  Diagnosis Date  . Anemia   . Diabetes mellitus type 2, insulin dependent ([place])   . Dyslipidemia   . GERD (gastroesophageal reflux disease)   . Hypertension   . Migraines   . Osteoporosis   . PVD (peripheral vascular disease) ([place])    abi .83 (L), .92 (R)  . RSD (reflex sympathetic dystrophy) 2007   R wrist/hand following fx  . Stroke ([place])   . TIA (transient ischemic attack)   . Varicose veins  Past Surgical History:  Procedure Laterality Date  . ABDOMINAL HYSTERECTOMY  1988  . APPENDECTOMY  1966  . BREAST CYST EXCISION    . CATARACT EXTRACTION  10/2009  . CHOLECYSTECTOMY  1989  . Several benign cyst removed     last 1 in 1972  . TUBAL LIGATION    . UMBILICAL HERNIA REPAIR      Family History  Problem Relation Age of Onset  . Stroke Mother 28  . Hypertension Mother   . Clotting disorder Father   . Heart attack Father   . Arrhythmia Sister   . Stroke Brother   . Diabetes Neg Hx   . Thyroid disease Neg Hx     Social History:  reports that she has never smoked. She has never used smokeless tobacco. She reports that she does not drink alcohol or use drugs.    Review of Systems    She has had  recurrent TIAs although not clear if she has migraine attacks   She is on Crestor 20 mg and 4 g Vascepa but triglycerides tend to be high, followed by PCP        Lab Results  Component Value Date   CHOL 119 10/21/2017   HDL 33 (L) 10/21/2017   LDLCALC 52 10/21/2017   LDLDIRECT 41.8 09/13/2011   TRIG 285 (H) 10/21/2017   CHOLHDL 3.6 10/21/2017                   Thyroid:    On exam has had a stable 3 cm nodule on the right side but she has refused to consider biopsy Clinically appears to be unchanged on follow-up  Lab Results  Component Value Date   TSH 1.820 02/13/2018       The blood pressure has been managed with losartan 50 mg and metoprolol, Followed by PCP Blood pressure is appearing generally      BP Readings from Last 3 Encounters:  02/17/18 (!) 152/88  02/13/18 (!) 141/58  01/24/18 (!) 144/86        Diabetic neuropathy: She has a history of Numbness in her feet and toes    Last foot exam was in 06/2017 showing sensory loss  On Prolia recently from her PCP for osteoporosis   Physical Examination:  BP (!) 152/88 (BP Location: Right Arm, Patient Position: Sitting, Cuff Size: Normal)   Pulse 86   Ht '5\' 4"'$  (1.626 m)   Wt 141 lb 3.2 oz (64 kg)   SpO2 97%   BMI 24.24 kg/m    ASSESSMENT /PLAN:  Diabetes type 2, uncontrolled    See history of present illness for detailed discussion of her current management and problems identified as well as blood sugar patterns  Her A1c is still 8.4  This is now with taking Trulicity and Toujeo  As discussed above she has postprandial hyperglycemia not controlled with Trulicity and she is not able to cooperate with taking Humalog Her main meal is in the morning but her highest blood sugars appear to be late at night and not entirely clear why  For now she will continue the same insulin and Trulicity regimen, reassured her that for her age and comorbidities her level of control is adequate She will call if she has any  tendency to hypoglycemia again or increasing fasting readings Her son was also present in the office  HYPERLIPIDEMIA: Followed by PCP.  And recommended fasting labs again with him  Hypertension: She will follow-up with her PCP  There are no Patient Instructions on file for this visit.   Rebecca Perkins 02/17/2018, 9:43 PM       Note: This office note was prepared with Dragon voice recognition system technology. Any transcriptional errors that result from this process are unintentional.

## 2018-02-22 ENCOUNTER — Ambulatory Visit: Payer: Medicare Other | Admitting: Podiatry

## 2018-02-22 DIAGNOSIS — H6123 Impacted cerumen, bilateral: Secondary | ICD-10-CM | POA: Insufficient documentation

## 2018-02-22 DIAGNOSIS — H900 Conductive hearing loss, bilateral: Secondary | ICD-10-CM | POA: Insufficient documentation

## 2018-02-22 DIAGNOSIS — B351 Tinea unguium: Secondary | ICD-10-CM | POA: Diagnosis not present

## 2018-02-22 DIAGNOSIS — M79676 Pain in unspecified toe(s): Secondary | ICD-10-CM

## 2018-02-24 NOTE — Progress Notes (Signed)
   SUBJECTIVE Patient with a history of diabetes mellitus presents to office today complaining of elongated, thickened nails that cause pain while ambulating in shoes. She is unable to trim her own nails. Patient is here for further evaluation and treatment.   Past Medical History:  Diagnosis Date  . Anemia   . Diabetes mellitus type 2, insulin dependent ([place])   . Dyslipidemia   . GERD (gastroesophageal reflux disease)   . Hypertension   . Migraines   . Osteoporosis   . PVD (peripheral vascular disease) ([place])    abi .83 (L), .92 (R)  . RSD (reflex sympathetic dystrophy) 2007   R wrist/hand following fx  . Stroke ([place])   . TIA (transient ischemic attack)   . Varicose veins     OBJECTIVE General Patient is awake, alert, and oriented x 3 and in no acute distress. Derm Skin is dry and supple bilateral. Negative open lesions or macerations. Remaining integument unremarkable. Nails are tender, long, thickened and dystrophic with subungual debris, consistent with onychomycosis, 1-5 bilateral. No signs of infection noted. Vasc  DP and PT pedal pulses palpable bilaterally. Temperature gradient within normal limits.  Neuro Epicritic and protective threshold sensation diminished bilaterally.  Musculoskeletal Exam No symptomatic pedal deformities noted bilateral. Muscular strength within normal limits.  ASSESSMENT 1. Diabetes Mellitus w/ peripheral neuropathy 2. Onychomycosis of nail due to dermatophyte bilateral 3. Pain in foot bilateral  PLAN OF CARE 1. Patient evaluated today. 2. Instructed to maintain good pedal hygiene and foot care. Stressed importance of controlling blood sugar.  3. Mechanical debridement of nails 1-5 bilaterally performed using a nail nipper. Filed with dremel without incident.  4. Return to clinic in 3 mos.     Edrick Kins, DPM [facility]  Dr. Edrick Kins, [address]                                        [place],  [place] [address]                Office [phone]  Fax [phone]

## 2018-03-08 ENCOUNTER — Other Ambulatory Visit: Payer: Self-pay | Admitting: Endocrinology

## 2018-03-27 ENCOUNTER — Other Ambulatory Visit: Payer: Self-pay | Admitting: Endocrinology

## 2018-04-17 ENCOUNTER — Other Ambulatory Visit: Payer: Self-pay | Admitting: Endocrinology

## 2018-04-24 ENCOUNTER — Ambulatory Visit: Payer: Medicare Other | Admitting: Adult Health

## 2018-04-24 ENCOUNTER — Other Ambulatory Visit: Payer: Self-pay | Admitting: Family Medicine

## 2018-04-24 ENCOUNTER — Encounter: Payer: Self-pay | Admitting: Adult Health

## 2018-04-24 VITALS — BP 160/71 | HR 63 | Ht 64.0 in | Wt 139.0 lb

## 2018-04-24 DIAGNOSIS — I10 Essential (primary) hypertension: Secondary | ICD-10-CM | POA: Diagnosis not present

## 2018-04-24 DIAGNOSIS — G459 Transient cerebral ischemic attack, unspecified: Secondary | ICD-10-CM | POA: Diagnosis not present

## 2018-04-24 DIAGNOSIS — R413 Other amnesia: Secondary | ICD-10-CM | POA: Diagnosis not present

## 2018-04-24 DIAGNOSIS — R441 Visual hallucinations: Secondary | ICD-10-CM

## 2018-04-24 DIAGNOSIS — E785 Hyperlipidemia, unspecified: Secondary | ICD-10-CM

## 2018-04-24 DIAGNOSIS — E11649 Type 2 diabetes mellitus with hypoglycemia without coma: Secondary | ICD-10-CM

## 2018-04-24 NOTE — Patient Instructions (Addendum)
Continue clopidogrel 75 mg daily  and Crestor  for secondary stroke prevention  Continue to follow up with PCP regarding cholesterol, blood pressure and diabetes management   You will be called for neurocognitive testing due to continued memory concerns and night time hallucinations    Continue to stay active and maintain a healthy diet  Continue to monitor blood pressure at home  Maintain strict control of hypertension with blood pressure goal below 130/90, diabetes with hemoglobin A1c goal below 6.5% and cholesterol with LDL cholesterol (bad cholesterol) goal below 70 mg/dL. I also advised the patient to eat a healthy diet with plenty of whole grains, cereals, fruits and vegetables, exercise regularly and maintain ideal body weight.  Followup in the future with me in 6 months or call earlier if needed       Thank you for coming to see [place] at [facility]. I hope we have been able to provide you high quality care today.  You may receive a patient satisfaction survey over the next few weeks. We would appreciate your feedback and comments so that we may continue to improve ourselves and the health of our patients.

## 2018-04-24 NOTE — Progress Notes (Signed)
[facility]  PATIENT: Rebecca Perkins DOB: 04-Feb-1929   REASON FOR VISIT: Follow-up recent hospitalization with complaints of dysarthria HISTORY FROM: patient   Chief Complaint  Patient presents with  . Follow-up    TIA follow up room 9 pt with Randy      HISTORY OF PRESENT ILLNESS: Visit 11/08/17 JV: Recent hospital admission for TIA on 09/04/17 secondary to SVD.  Patient came in with right arm weakness and expressive aphasia where the symptoms resolved spontaneously and approximately 45 minutes.  CT scan reviewed and showed no acute intracranial abnormality.  MRI brain reviewed and negative for acute stroke.  MRA of the head showed 50-75% stenosis in the mid basilar, 50% stenosis in left PCA P1/P2 junction and severe stenosis P2/P3 segment in the right PCA.  Bilateral carotid ultrasound showed bilateral stenosis of 1-39%.  2D echo showed an EF of 60-65%.  LDL 149 and A1c 8.4.  Patient has previously been on Lipitor for 6 months but had to stop it due to leg cramps and leg weakness.  She tried Crestor for a few weeks but had to stop due to diarrhea.  She has been taking Lovaza and co-Q10 daily.  After this admission she was also started on Zetia.  Recommended to consider PCS2 inhibitors such as Repatha or Praluent in the outpatient clinic.  She was also discharged on aspirin 81 mg and Plavix for 3 weeks total and then to discontinue Plavix and continue aspirin with an increase to 325 mg daily.  Patient was discharged home in stable condition.  Since discharge, patient has been doing well.  She has been taking Crestor 40 mg is been tolerating this well without any side effects or myalgias.  At hospital discharge was recommended for patient to take Zetia and continue Lovaza but patient is taking neither and only Crestor at this time.  Most recent LDL 52 on 10/21/2017.  Continues to take aspirin without side effects of bleeding or bruising.  Blood pressures today's visit elevated  at 179/78 but does states she is feeling anxious prior to appointment as she was already running late and had a lot of traffic on the way in.  She does check her blood pressure at home and typically runs SBP 150s.  SBP advised the patient that blood pressure should be less than 130/90 and advised her to continue to monitor this at home and if blood pressure remains elevated to follow-up with PCP.  Patient does have endocrinologist for diabetic management.  Denies any recent headaches or new or worsening stroke/TIA symptoms.  Patient continues to live alone since her husband passed 2 years ago by her grandson and son check on her daily.  Denies sleep apnea symptoms such as snoring, daytime fatigue or frequent napping.  Denies interest in PREMIERS trial.  12/12/17 visit: Patient was seen 11/09/17 after having episodes of slurred speech and difficulty finding her words which lasted for approximately 45 minutes and then resolved.  CT scan and MRI reviewed is negative for acute infarcts.  It was recommended the patient stop aspirin 368m and start Plavix at that time. It was recommended that patient patient was discharged in stable condition.  Patient is being seen today for follow-up appointment is accompanied by her son.  Patient denies recurrent dysarthria/aphasic symptoms.  During these episodes of dysarthria/aphasia, patient is unaware of speech difficulties and she states she lives by herself and was not aware until her son came over.  She does not lose consciousness  during these events, his full knowledge of surroundings, and denies vision changes.  Patient states that she has had mild headaches during these episodes and does have a history of migraines.  Denies aura or transient neurologic symptoms.  Her past migraines were accompanied by photophobia.  Her headaches are located over her eyes and eyebrows and is unsure how often these happen.  She states that she will take Tylenol at times this helps but other  times they can last for hours.  She does live alone and is able to do all ADLs without complications.  These headaches are not debilitating.  At recent appointment PCP, it was being considered to start patient on Xarelto in addition to aspirin 81 mg "who have a history of coronary artery disease, peripheral vascular disease, that showed reduction in mortality mainly from the prevention of strokes when compared to continuing antiplatelet therapy alone".  At this time patient is continuing to take Plavix with mild bruising but no bleeding.  Patient also continues to take Crestor without side effects no myalgia or GI symptoms such as diarrhea as she has had in the past.  02/13/18 UPDATE: Patient returns today for follow-up and is accompanied by both of her sons.  Patient did have a recent episode during the night of confusion and hallucinations with this lasted for approximately 12 hours and then subsided.  Patient feels like recently her long-term memory has been intact but her short-term memory has been worsening with increasing intermittent confusion.  She continues also complain of headaches that occur weekly that occur on the right side and radiate down to the back of her neck.  These headaches typically are not accompanied by photophobia, phonophobia or nausea and vomiting and are not debilitating.  Continues to take Plavix without side effects of bleeding or bruising.  Continues to take Crestor without side effects myalgias.  Blood pressure today satisfactory 141/58.  Due to memory complaints MMSE was performed with 27/30.  Patient continues to live on her own and doing all ADLs and most IADLs with some assistance from her grandson.  Denies new or worsening stroke/TIA symptoms.  Interval history 04/24/2018: Patient is being seen today for scheduled follow-up appointment.  Lab work that was obtained after prior visit all was satisfactory including UA being negative for UTI.  Patient states 2 weeks ago she had  an episode of hallucination at night where she thought somebody was doing house.  She states when she went to go look around, she saw her cousins and nieces in her house who was there at the prior day.  She called her daughter-in-law around 37 AM telling her who was in her house and after her son arrived to check on her, she no longer had these hallucinations.  She was aware after this episode that who she saw was not really there.  This is the only episode she has had since prior visit.  Son also states that she is been having increased difficulty with timeframe such as length of time she may be doing something or times with appointments or having somebody over to the house.  He states her long-term memory is intact.  Denies any symptoms of depression or anxiety or history of depression or anxiety.  Continues to take Plavix with mild bruising but no bleeding.  Continues to take Crestor without side effects of myalgias.  Blood pressure initially 160/71 but rechecked manually for 142/68.  Frequent headaches resolved and only has occasional headaches which she will take  Tylenol for with relief.  Continues to live independently where she is able to perform all ADLs and receives minimal assistance for IADLs.  Denies new or worsening stroke/TIA symptoms.   REVIEW OF SYSTEMS: Full 14 system review of systems performed and notable only for those listed, all others are neg:  Hallucinations    ALLERGIES: Allergies  Allergen Reactions  . Lipitor [Atorvastatin Calcium] Diarrhea  . Niacin And Related Hives  . Statins Diarrhea  . Macrobid [Nitrofurantoin Macrocrystal] Other (See Comments)    unknown    HOME MEDICATIONS: Outpatient Medications Prior to Visit  Medication Sig Dispense Refill  . Blood Glucose Monitoring Suppl (ONE TOUCH ULTRA SYSTEM KIT) W/DEVICE KIT 1 kit by Does not apply route once. Tests Blood sugar before meals and at bedtime 1 each 0  . Calcium Carbonate-Vitamin D (CALTRATE 600+D PO) Take  by mouth 2 (two) times daily.    . clopidogrel (PLAVIX) 75 MG tablet Take 1 tablet (75 mg total) by mouth daily. 30 tablet 0  . Coenzyme Q10 (COQ10 PO) Take by mouth.    . Continuous Blood Gluc Sensor (FREESTYLE LIBRE 14 DAY SENSOR) MISC 1 Units by Other route every 14 (fourteen) days. Use 1 unit q14d Dx: E11.9 2 each 1  . denosumab (PROLIA) 60 MG/ML SOSY injection Inject into the skin every 6 (six) months.     . Dulaglutide 0.75 MG/0.5ML SOPN INJECT INTO THE ABDOMINAL SKIN ONCE WEEKLY    . ferrous sulfate 325 (65 FE) MG tablet Take 1 tablet (325 mg total) by mouth 3 (three) times daily with meals. 90 tablet 4  . Icosapent Ethyl (VASCEPA) 1 g CAPS Take 2 capsules (2 g total) by mouth 2 (two) times daily. 360 capsule 3  . Insulin Glargine (TOUJEO MAX SOLOSTAR) 300 UNIT/ML SOPN Inject 34 Units into the skin every morning.     . insulin lispro (HUMALOG KWIKPEN) 100 UNIT/ML KiwkPen Use only if BS over 150 in am 15 mL 1  . Insulin Pen Needle (B-D ULTRAFINE III SHORT PEN) 31G X 8 MM MISC INJECT AS DIRECTED 4 TIMES DAILY 300 each 0  . INSULIN SYRINGE 1CC/29G 29G X 1/2" 1 ML MISC AS DIRECTED. 100 each 3  . losartan (COZAAR) 100 MG tablet TAKE 1 TABLET BY MOUTH EVERY DAY (Patient taking differently: TAKE 1/2 TABLET BY MOUTH EVERY DAY) 90 tablet 3  . metoprolol (LOPRESSOR) 50 MG tablet TAKE 1/2 TABLET  BY MOUTH TWICE DAILY 180 tablet 0  . Multiple Vitamin (MULTIVITAMIN) tablet Take 1 tablet by mouth daily.      Marland Kitchen omeprazole (PRILOSEC) 20 MG capsule TAKE ONE CAPSULE BY MOUTH DAILY 90 capsule 3  . ONE TOUCH ULTRA TEST test strip USE FOUR TIMES DAILY BEFORE MEALS AND EVERY NIGHT AT BEDTIME 150 each 5  . Pyridoxine HCl (VITAMIN B-6) 100 MG tablet Take 100 mg by mouth daily.      . rosuvastatin (CRESTOR) 20 MG tablet Take 1 tablet (20 mg total) by mouth daily. 90 tablet 3  . TOUJEO SOLOSTAR 300 UNIT/ML SOPN INJECT 46 UNITS UNDER THE SKIN DAILY (Patient taking differently: 34 Units. ) 21 mL 2  . clopidogrel  (PLAVIX) 75 MG tablet TAKE 1 TABLET BY MOUTH DAILY (Patient not taking: Reported on 04/24/2018) 90 tablet 3  . clopidogrel (PLAVIX) 75 MG tablet Take by mouth.    . Coenzyme Q10 100 MG capsule Take by mouth.    . ferrous sulfate 325 (65 FE) MG tablet Take by mouth.    Marland Kitchen  furosemide (LASIX) 40 MG tablet     . Insulin Glargine 300 UNIT/ML SOPN Inject into the skin.    Marland Kitchen losartan (COZAAR) 100 MG tablet TAKE 1 TABLET BY MOUTH EVERY DAY    . metoprolol tartrate (LOPRESSOR) 50 MG tablet TAKE 1/2 TABLET  BY MOUTH TWICE DAILY    . rosuvastatin (CRESTOR) 20 MG tablet Take by mouth.    . TRULICITY 0.97 DZ/3.2DJ SOPN INJECT INTO THE ABDOMINAL SKIN ONCE WEEKLY (Patient not taking: Reported on 04/24/2018) 2 mL 3   Facility-Administered Medications Prior to Visit  Medication Dose Route Frequency Provider Last Rate Last Dose  . denosumab (PROLIA) injection 60 mg  60 mg Subcutaneous Q6 months [name], MD   60 mg at 10/21/17 1030    PAST MEDICAL HISTORY: Past Medical History:  Diagnosis Date  . Anemia   . Diabetes mellitus type 2, insulin dependent ([place])   . Dyslipidemia   . GERD (gastroesophageal reflux disease)   . Hypertension   . Migraines   . Osteoporosis   . PVD (peripheral vascular disease) ([place])    abi .83 (L), .92 (R)  . RSD (reflex sympathetic dystrophy) 2007   R wrist/hand following fx  . Stroke ([place])   . TIA (transient ischemic attack)   . Varicose veins     PAST SURGICAL HISTORY: Past Surgical History:  Procedure Laterality Date  . ABDOMINAL HYSTERECTOMY  1988  . APPENDECTOMY  1966  . BREAST CYST EXCISION    . CATARACT EXTRACTION  10/2009  . CHOLECYSTECTOMY  1989  . Several benign cyst removed     last 1 in 1972  . TUBAL LIGATION    . UMBILICAL HERNIA REPAIR      FAMILY HISTORY: Family History  Problem Relation Age of Onset  . Stroke Mother 86  . Hypertension Mother   . Clotting disorder Father   . Heart attack Father   . Arrhythmia Sister   . Stroke Brother    . Diabetes Neg Hx   . Thyroid disease Neg Hx     SOCIAL HISTORY: Social History   Socioeconomic History  . Marital status: Widowed    Spouse name: Not on file  . Number of children: 3  . Years of education: Not on file  . Highest education level: Not on file  Occupational History  . Occupation: Retired  Scientific laboratory technician  . Financial resource strain: Not on file  . Food insecurity:    Worry: Not on file    Inability: Not on file  . Transportation needs:    Medical: Not on file    Non-medical: Not on file  Tobacco Use  . Smoking status: Never Smoker  . Smokeless tobacco: Never Used  Substance and Sexual Activity  . Alcohol use: No  . Drug use: No  . Sexual activity: Not on file  Lifestyle  . Physical activity:    Days per week: Not on file    Minutes per session: Not on file  . Stress: Not on file  Relationships  . Social connections:    Talks on phone: Not on file    Gets together: Not on file    Attends religious service: Not on file    Active member of club or organization: Not on file    Attends meetings of clubs or organizations: Not on file    Relationship status: Not on file  . Intimate partner violence:    Fear of current or ex partner: Not on file  Emotionally abused: Not on file    Physically abused: Not on file    Forced sexual activity: Not on file  Other Topics Concern  . 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 General: Pleasant frail Caucasian female, Caucasian lady, seated, in no evident distress Head: head normocephalic and atraumatic.  Neck: supple with no carotid  bruits Cardiovascular: regular rate and rhythm, no murmurs Musculoskeletal: no  deformity Skin:  no rash/petichiae  Neurological examination  Mental Status: Awake and fully alert. Oriented to place and time.  Attention span, concentration and fund of knowledge appropriate. Mood and affect appropriate.  Cranial Nerves: Pupils equal, briskly reactive to light. Extraocular movements full without nystagmus. Visual fields full to confrontation. Hearing intact. Facial sensation intact. Face, tongue, palate moves normally and symmetrically.  Motor: Normal bulk and tone. Normal strength in all tested extremity muscles.  Sensory.:  Normal sensation throughout all tested extremities. Coordination: Rapid alternating movements normal in all extremities. Finger-to-nose with ataxia and right arm but normal in left arm and unable to perform heel-to-shin with right leg due to knee pain but normal in left leg.  Cogwheel rigidity right arm  Gait and Station: Arises from chair without difficulty. Stance is hunched. Gait demonstrates normal stride length and balance. Unable to heel, toe and tandem walk without difficulty. No assistive device Reflexes: 1+ and symmetric. Toes downgoing.   DIAGNOSTIC DATA (LABS, IMAGING, TESTING) - I reviewed patient records, labs, notes, testing and imaging myself where available.  EEG ADULT 01/11/18 Summary Normal electroencephalogram, awake, asleep and with activation procedures. There are no focal lateralizing or epileptiform features.   Lab Results  Component Value Date   WBC 6.5 11/09/2017   HGB 11.9 (L) 11/10/2017   HCT 35.0 (L) 11/10/2017   MCV 85.9 11/09/2017   PLT 168 11/09/2017      Component Value Date/Time   NA 144 02/13/2018 1336   K 4.0 02/13/2018 1336   CL 108 (H) 02/13/2018 1336   CO2 23 02/13/2018 1336   GLUCOSE 315 (H) 02/13/2018 1336   GLUCOSE 229 (H) 12/01/2017 1634   BUN 12 02/13/2018 1336   CREATININE 0.69 02/13/2018 1336   CREATININE 0.76 12/01/2017 1634   CALCIUM 9.4 02/13/2018 1336   PROT 6.7 02/13/2018 1336   ALBUMIN  3.9 02/13/2018 1336   AST 19 02/13/2018 1336   ALT 16 02/13/2018 1336   ALKPHOS 70 02/13/2018 1336   BILITOT 0.3 02/13/2018 1336   GFRNONAA 79 02/13/2018 1336   GFRNONAA 71 12/01/2017 1634   GFRAA 91 02/13/2018 1336   GFRAA 82 12/01/2017 1634   Lab Results  Component Value Date   CHOL 119 10/21/2017   HDL 33 (L) 10/21/2017   LDLCALC 52 10/21/2017   LDLDIRECT 41.8 09/13/2011   TRIG 285 (H) 10/21/2017   CHOLHDL 3.6 10/21/2017   Lab Results  Component Value Date   HGBA1C 8.4 (A) 02/17/2018   Lab Results  Component Value Date   VITAMINB12 361 02/13/2018   Lab Results  Component Value Date   TSH 1.820 02/13/2018      ASSESSMENT 30 year lady with TIA versus complicated migraine versus  seizure on 11/09/2017. Vascular risk factors include previous TIAs, DM, HLD, and HTN.  Patient returns today for follow-up visit and overall has been doing well from a stroke standpoint but does have continued complaints of visual hallucinations and decreased short-term memory.  PLAN  -Continue Plavix and Crestor for secondary stroke prevention -f/u with PCP regarding HLD, HTN and DM management -Neurocognitive referral placed due to continued visual hallucinations and decreasing memory concerns -Continue to monitor blood pressure at home -Continue to stay active and maintain healthy diet -Maintain strict control of hypertension with blood pressure goal below 130/90, diabetes with hemoglobin A1c goal below 6.5% and cholesterol with LDL cholesterol (bad cholesterol) goal below 70 mg/dL. I also advised the patient to eat a healthy diet with plenty of whole grains, cereals, fruits and vegetables, exercise regularly and maintain ideal body weight.  Followup in the future with me in 6 months as patient is stable from stroke standpoint.  She will call earlier with questions, concerns, worsening in symptoms or need of sooner follow-up appointment.   Greater than 50% time during this 25 minute  consultation visit was spent on counseling and coordination of care about HLD, HTN and DM (risk factors), discussion about risk benefit of anticoagulation and answering questions.   Venancio Poisson, AGNP-BC  [facility] [address] [place], [place] [address]  Phone [phone] Fax [phone]

## 2018-04-25 ENCOUNTER — Ambulatory Visit (INDEPENDENT_AMBULATORY_CARE_PROVIDER_SITE_OTHER): Payer: Medicare Other

## 2018-04-25 DIAGNOSIS — M810 Age-related osteoporosis without current pathological fracture: Secondary | ICD-10-CM

## 2018-04-25 MED ORDER — DENOSUMAB 60 MG/ML ~~LOC~~ SOSY
60.0000 mg | PREFILLED_SYRINGE | Freq: Once | SUBCUTANEOUS | Status: AC
  Administered 2018-04-25: 60 mg via SUBCUTANEOUS

## 2018-04-25 MED ORDER — DENOSUMAB 60 MG/ML ~~LOC~~ SOSY: 60.0000 mg | PREFILLED_SYRINGE | Freq: Once | SUBCUTANEOUS | Status: DC

## 2018-04-25 NOTE — Progress Notes (Signed)
Patient was in office for her prolia injection.Patient received injection in her right arm subcutaneous.Patient tolerated well

## 2018-04-28 NOTE — Progress Notes (Signed)
I agree with the above plan 

## 2018-05-01 ENCOUNTER — Encounter: Payer: Self-pay | Admitting: Psychology

## 2018-05-11 ENCOUNTER — Ambulatory Visit: Payer: Medicare Other | Admitting: Adult Health

## 2018-05-19 ENCOUNTER — Ambulatory Visit: Payer: Medicare Other | Admitting: Endocrinology

## 2018-05-22 ENCOUNTER — Telehealth: Payer: Self-pay | Admitting: Endocrinology

## 2018-05-22 NOTE — Telephone Encounter (Signed)
Please verify toujeo is 34 units qam Then please increase to 45 units qam. Please call or message [place] in a few days, to tell us how the blood sugar is doing

## 2018-05-22 NOTE — Telephone Encounter (Signed)
Could you please address this in Dr Ronnie Derby Absence?   Pts son stated pts sugars are running around the 320-470 range for the last week  She has just started taking the Humalog in the last days weeks due to this. Pt could not come to her last visit due to low sugars, Pts son wanted to make sure they are doing everything possible to get her sugars undercontrol  Please advise   [name] (son)  [phone] Mobile       insulin lispro (HUMALOG KWIKPEN) 100 UNIT/ML KiwkPen   TOUJEO SOLOSTAR 300 UNIT/ML SOPN

## 2018-05-22 NOTE — Telephone Encounter (Signed)
FYI

## 2018-05-22 NOTE — Telephone Encounter (Signed)
Please advise 

## 2018-05-23 NOTE — Telephone Encounter (Signed)
Pt son stated --mom having possible dementia and not taking the medication as instructed. Notified pt son verify taking 34 units of Rx toujeo---need to go up 45 units qam and pt son understood with Dr. Loanne Drilling instructions.--

## 2018-05-25 ENCOUNTER — Encounter: Payer: Self-pay | Admitting: Podiatry

## 2018-05-25 ENCOUNTER — Ambulatory Visit: Payer: Medicare Other | Admitting: Podiatry

## 2018-05-25 DIAGNOSIS — B351 Tinea unguium: Secondary | ICD-10-CM | POA: Diagnosis not present

## 2018-05-25 DIAGNOSIS — E1142 Type 2 diabetes mellitus with diabetic polyneuropathy: Secondary | ICD-10-CM

## 2018-05-25 DIAGNOSIS — M79674 Pain in right toe(s): Secondary | ICD-10-CM

## 2018-05-25 DIAGNOSIS — L84 Corns and callosities: Secondary | ICD-10-CM | POA: Diagnosis not present

## 2018-05-25 DIAGNOSIS — M79675 Pain in left toe(s): Secondary | ICD-10-CM | POA: Diagnosis not present

## 2018-05-29 ENCOUNTER — Telehealth: Payer: Self-pay | Admitting: Endocrinology

## 2018-05-29 NOTE — Telephone Encounter (Signed)
Dr. Maretta Bees,  I spoke with pt and she states when she was last here to see Dwyane Dee there was some confusion with the Trulicty and pt has not been taking it. She states she is still doing her daily dose of insulin. Here are readings:  10/27 AM-137            PM-159 10/26 AM-89           PM-274 10/25 AM-79           PM-209

## 2018-05-29 NOTE — Telephone Encounter (Signed)
Can you please call the patient and ask her to give you fasting glucose for the past 3 mornings and bedtime house for the past 3 days ?   It seems she is on trulicity and once a day insulin injection , can you also make sure this is also corrects?    It's a bit weird looking at Epic through my phone. Not all info is available    Thanks

## 2018-05-29 NOTE — Telephone Encounter (Signed)
Please ask the patient to restart Trulicity and continue the insulin the same.    Thanks

## 2018-05-29 NOTE — Telephone Encounter (Signed)
Dr. Maretta Bees please advise in the absence of Dr. Dwyane Dee,  Received fax from [facility], the message reads: Pt's blood sugar is up and the doctor has up'ed her rx and they were told to let the office know how her blood sugars have been. Caller states that she has been fluctuating between 191-307. Caller wanting to know if they should continue on the same rx dosage. They are checking before and after meals and before bedtime.

## 2018-05-30 NOTE — Telephone Encounter (Signed)
Spoke with pt and informed her of message per Dr. Maretta Bees, pt agreed to restart medication. She is aware that she is due for a follow up appt with Dwyane Dee and she will call us back to schedule. Nothing further is needed

## 2018-06-02 ENCOUNTER — Other Ambulatory Visit: Payer: Self-pay

## 2018-06-02 ENCOUNTER — Encounter: Payer: Self-pay | Admitting: Family Medicine

## 2018-06-02 ENCOUNTER — Ambulatory Visit: Payer: Medicare Other | Admitting: Family Medicine

## 2018-06-02 ENCOUNTER — Telehealth: Payer: Self-pay | Admitting: Adult Health

## 2018-06-02 VITALS — BP 164/80 | HR 65 | Wt 139.0 lb

## 2018-06-02 DIAGNOSIS — I10 Essential (primary) hypertension: Secondary | ICD-10-CM | POA: Diagnosis not present

## 2018-06-02 MED ORDER — HYDROCHLOROTHIAZIDE 12.5 MG PO CAPS: 12.5000 mg | ORAL_CAPSULE | Freq: Every day | ORAL | 5 refills | Status: DC

## 2018-06-02 NOTE — Telephone Encounter (Signed)
Pt son(on [facility]-[place],Randy) has called re:[facility]- [facility] no longer being an option for pt.  He would like a call for pt to be referred to the [facility] location, please call

## 2018-06-02 NOTE — Progress Notes (Signed)
Subjective:    Patient ID: Rebecca Perkins, female    DOB: [birth year], 84 y.o.   MRN: [id number]  HPI Patient presents today with her son reporting elevated blood pressures.  Apparently her blood pressures have been averaging in the 160s over 80s.  She denies any chest pain shortness of breath or dyspnea on exertion.  She is currently taking losartan 100 mg a day and metoprolol 25 mg twice daily.  She is compliant with her medications but the blood pressures remain elevated.  In the past she has complained of swelling in her legs making amlodipine problematic.  She also had an episode of renal insufficiency and acute kidney injury last year due to dehydration coupled with Lasix making diuretics problematic Past Medical History:  Diagnosis Date  . Anemia   . Diabetes mellitus type 2, insulin dependent ([place])   . Dyslipidemia   . GERD (gastroesophageal reflux disease)   . Hypertension   . Migraines   . Osteoporosis   . PVD (peripheral vascular disease) ([place])    abi .83 (L), .92 (R)  . RSD (reflex sympathetic dystrophy) 2007   R wrist/hand following fx  . Stroke ([place])   . TIA (transient ischemic attack)   . Varicose veins    Past Surgical History:  Procedure Laterality Date  . ABDOMINAL HYSTERECTOMY  1988  . APPENDECTOMY  1966  . BREAST CYST EXCISION    . CATARACT EXTRACTION  10/2009  . CHOLECYSTECTOMY  1989  . Several benign cyst removed     last 1 in 1972  . TUBAL LIGATION    . UMBILICAL HERNIA REPAIR     Current Outpatient Medications on File Prior to Visit  Medication Sig Dispense Refill  . Blood Glucose Monitoring Suppl (ONE TOUCH ULTRA SYSTEM KIT) W/DEVICE KIT 1 kit by Does not apply route once. Tests Blood sugar before meals and at bedtime 1 each 0  . Calcium Carbonate-Vitamin D (CALTRATE 600+D PO) Take by mouth 2 (two) times daily.    . clopidogrel (PLAVIX) 75 MG tablet Take 1 tablet (75 mg total) by mouth daily. 30 tablet 0  . Coenzyme Q10 (COQ10 PO) Take by mouth.      . Continuous Blood Gluc Sensor (FREESTYLE LIBRE 14 DAY SENSOR) MISC 1 Units by Other route every 14 (fourteen) days. Use 1 unit q14d Dx: E11.9 2 each 1  . denosumab (PROLIA) 60 MG/ML SOSY injection Inject into the skin every 6 (six) months.     . Dulaglutide 0.75 MG/0.5ML SOPN INJECT INTO THE ABDOMINAL SKIN ONCE WEEKLY    . Icosapent Ethyl (VASCEPA) 1 g CAPS Take 2 capsules (2 g total) by mouth 2 (two) times daily. 360 capsule 3  . Insulin Glargine (TOUJEO MAX SOLOSTAR) 300 UNIT/ML SOPN Inject 34 Units into the skin every morning.     . insulin lispro (HUMALOG KWIKPEN) 100 UNIT/ML KiwkPen Use only if BS over 150 in am 15 mL 1  . Insulin Pen Needle (B-D ULTRAFINE III SHORT PEN) 31G X 8 MM MISC INJECT AS DIRECTED 4 TIMES DAILY 300 each 0  . INSULIN SYRINGE 1CC/29G 29G X 1/2" 1 ML MISC AS DIRECTED. 100 each 3  . losartan (COZAAR) 100 MG tablet TAKE 1 TABLET BY MOUTH EVERY DAY (Patient taking differently: 100 mg. ) 90 tablet 3  . metoprolol (LOPRESSOR) 50 MG tablet TAKE 1/2 TABLET  BY MOUTH TWICE DAILY 180 tablet 0  . Multiple Vitamin (MULTIVITAMIN) tablet Take 1 tablet by mouth daily.      Marland Kitchen  omeprazole (PRILOSEC) 20 MG capsule TAKE ONE CAPSULE BY MOUTH DAILY 90 capsule 3  . ONE TOUCH ULTRA TEST test strip USE FOUR TIMES DAILY BEFORE MEALS AND EVERY NIGHT AT BEDTIME 150 each 5  . Pyridoxine HCl (VITAMIN B-6) 100 MG tablet Take 100 mg by mouth daily.      . rosuvastatin (CRESTOR) 20 MG tablet Take 1 tablet (20 mg total) by mouth daily. 90 tablet 3  . TOUJEO SOLOSTAR 300 UNIT/ML SOPN INJECT 46 UNITS UNDER THE SKIN DAILY 21 mL 2   Current Facility-Administered Medications on File Prior to Visit  Medication Dose Route Frequency Provider Last Rate Last Dose  . denosumab (PROLIA) injection 60 mg  60 mg Subcutaneous Q6 months [name], MD   60 mg at 10/21/17 1030   Allergies  Allergen Reactions  . Lipitor [Atorvastatin Calcium] Diarrhea  . Niacin And Related Hives  . Statins Diarrhea  .  Macrobid [Nitrofurantoin Macrocrystal] Other (See Comments)    unknown   Social History   Socioeconomic History  . Marital status: Widowed    Spouse name: Not on file  . Number of children: 3  . Years of education: Not on file  . Highest education level: Not on file  Occupational History  . Occupation: Retired  Scientific laboratory technician  . Financial resource strain: Not on file  . Food insecurity:    Worry: Not on file    Inability: Not on file  . Transportation needs:    Medical: Not on file    Non-medical: Not on file  Tobacco Use  . Smoking status: Never Smoker  . Smokeless tobacco: Never Used  Substance and Sexual Activity  . Alcohol use: No  . Drug use: No  . Sexual activity: Not on file  Lifestyle  . Physical activity:    Days per week: Not on file    Minutes per session: Not on file  . Stress: Not on file  Relationships  . Social connections:    Talks on phone: Not on file    Gets together: Not on file    Attends religious service: Not on file    Active member of club or organization: Not on file    Attends meetings of clubs or organizations: Not on file    Relationship status: Not on file  . Intimate partner violence:    Fear of current or ex partner: Not on file    Emotionally abused: Not on file    Physically abused: Not on file    Forced sexual activity: Not on file  Other Topics Concern  . 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      Objective:   Physical Exam  Constitutional: She appears well-developed and well-nourished.  Neck: No JVD present.  Cardiovascular: Normal rate and regular rhythm.  Pulmonary/Chest: Effort normal and breath sounds normal. No stridor. No respiratory distress. She has no wheezes. She has no rales.  Abdominal: Soft. Bowel sounds are  normal.  Musculoskeletal: She exhibits no edema.  Skin: No erythema.  Vitals reviewed.         Assessment & Plan:  Essential hypertension  We discussed adding amlodipine versus hydrochlorothiazide to try to achieve a blood pressure in the 140s over 80s.  Ultimately we decided  to try hydrochlorothiazide 12.5 mg daily and recheck a BMP in 4 weeks.  She will need to monitor her fluid intake to avoid dehydration.  I believe we started amlodipine the swelling in her legs will become problematic and she would want to discontinue the medication.  Recheck in 3 to 4 weeks

## 2018-06-05 NOTE — Telephone Encounter (Signed)
Called and spoke to Patient's son [name] at [phone] and relayed that we would get his mother sent to Dr. Gildardo Pounds in [place] Telephone [phone] - fax [phone] . [name] has number as well.  Neuro Cognitive testing.

## 2018-06-06 ENCOUNTER — Other Ambulatory Visit (INDEPENDENT_AMBULATORY_CARE_PROVIDER_SITE_OTHER): Payer: Medicare Other

## 2018-06-06 ENCOUNTER — Encounter: Payer: Medicare Other | Admitting: Endocrinology

## 2018-06-06 DIAGNOSIS — E1165 Type 2 diabetes mellitus with hyperglycemia: Secondary | ICD-10-CM | POA: Diagnosis not present

## 2018-06-06 DIAGNOSIS — Z794 Long term (current) use of insulin: Secondary | ICD-10-CM | POA: Diagnosis not present

## 2018-06-06 LAB — COMPREHENSIVE METABOLIC PANEL
ALT: 15 U/L (ref 0–35)
AST: 16 U/L (ref 0–37)
Albumin: 3.8 g/dL (ref 3.5–5.2)
Alkaline Phosphatase: 51 U/L (ref 39–117)
BUN: 15 mg/dL (ref 6–23)
CO2: 32 mEq/L (ref 19–32)
Calcium: 9.9 mg/dL (ref 8.4–10.5)
Chloride: 100 mEq/L (ref 96–112)
Creatinine, Ser: 0.91 mg/dL (ref 0.40–1.20)
GFR: 62.15 mL/min (ref >60.00)
Glucose, Bld: 343 mg/dL — ABNORMAL HIGH (ref 70–99)
Potassium: 3.6 mEq/L (ref 3.5–5.1)
Sodium: 137 mEq/L (ref 135–145)
Total Bilirubin: 0.5 mg/dL (ref 0.2–1.2)
Total Protein: 7 g/dL (ref 6.0–8.3)

## 2018-06-06 LAB — LIPID PANEL
Cholesterol: 140 mg/dL (ref 0–200)
HDL: 35.6 mg/dL — ABNORMAL LOW (ref >39.00)
NonHDL: 104.66
Total CHOL/HDL Ratio: 4
Triglycerides: 303 mg/dL — ABNORMAL HIGH (ref 0.0–149.0)
VLDL: 60.6 mg/dL — ABNORMAL HIGH (ref 0.0–40.0)

## 2018-06-06 LAB — LDL CHOLESTEROL, DIRECT: Direct LDL: 45 mg/dL

## 2018-06-06 LAB — HEMOGLOBIN A1C: Hgb A1c MFr Bld: 10.2 % — ABNORMAL HIGH (ref 4.6–6.5)

## 2018-06-06 LAB — MICROALBUMIN / CREATININE URINE RATIO
Creatinine,U: 75 mg/dL
Microalb Creat Ratio: 5 mg/g (ref 0.0–30.0)
Microalb, Ur: 3.8 mg/dL — ABNORMAL HIGH (ref 0.0–1.9)

## 2018-06-07 NOTE — Progress Notes (Signed)
This encounter was created in error - please disregard.

## 2018-06-08 NOTE — Progress Notes (Signed)
Patient ID: Rebecca Perkins, female   DOB: 1929/03/11, 84 y.o.   MRN: [id number]            Reason for Appointment: Follow-up for Type 2 Diabetes  Referring physician: Pickard  History of Present Illness:          Diagnosis: Type 2 diabetes mellitus, date of diagnosis: 2006       Past history:  At the time of diagnosis she was feeling tired and weak but does not know what her initial blood sugar was. She probably was tried on metformin initially but because of GI side effects discussed not be continued Also did not tolerate Actos because of nausea. She thinks that she was started on insulin within a few months of her initial diagnosis  Not clear what insulin she was trying in the beginning but she thinks it was NovoLog; however for the last few years has been on Lantus only Her blood sugars have been persistently poorly controlled with A1c usually 8-9% about 2011 At initial consultation she was taking 60 units of Lantus insulin without any mealtime coverage On her visit in 3/17 because of her high A1c she was advised to start taking mealtime insulin at suppertime  In 09/2016 because of A1c being 9.9 she was told to start Victoza in addition to her basal bolus insulin regimen  Recent history:   INSULIN regimen is: 34 units Toujeo in a.m., Humalog: None  Non-insulin hypoglycemic drugs the patient is taking are: None, was on Trulicity 9.37 mg weekly:     Her previous A1c was 8.4 and now 10.2  Current management, blood sugars and problems identified:   Patient stopped her Trulicity probably after her last visit on her own even though she was not told to change this  Despite her blood sugars being markedly higher she did not call until last month to report this  She took 45 units of insulin for couple of days as per instructions but is back on 34 units now, with this her morning sugar was low normal but did not help her postprandial readings  She does take her Toujeo consistently  in the morning  She has maintained her weight despite her high blood sugars  She does not think she is excessively thirsty  She is drinking only diet drinks       Side effects from medications have been: Metformin, Actos: nausea, diarrhea    Compliance with the medical regimen: Fair  Hypoglycemia: None  but she feels a little hypoglycemic when blood sugars are around 80  Glucose monitoring:  done 1-2 times a day         Glucometer:   Freestyle libre  CONTINUOUS GLUCOSE MONITORING RECORD INTERPRETATION    Dates of Recording:   Sensor description:  Results statistics:   CGM use % of time  74  Average and SD  222, was 183+/-63  Time in range     24   %  % Time Above 180  75  % Time above 250  32  % Time Below target 1    PRE-MEAL Fasting Lunch Dinner Bedtime Overall  Glucose range:  90-247      Mean/median:  158  255  240   222   POST-MEAL PC Breakfast PC Lunch PC Dinner  Glucose range:     Mean/median:  225   251    Glycemic patterns summary:   Hyperglycemic episodes    Hypoglycemic episodes occurred  Overnight periods:  Preprandial periods:   Postprandial periods: After breakfast:   After lunch:   After dinner:    Self-care: The diet that the patient has been following is: tries to limit portions .     Meals: 2-3 meals per day. Breakfast is variable, bacon, fruit, egg    Dinner 7 pm  She will snack on peanut butter crackers or some low-fat trail mix at lunch, sometimes will eat a sandwich at lunch            Exercise: none  Dietician visit, most recent none               Weight history: Highest weight has been 170  Wt Readings from Last 3 Encounters:  06/09/18 133 lb (60.3 kg)  06/02/18 139 lb (63 kg)  04/24/18 139 lb (63 kg)    Glycemic control:   Lab Results  Component Value Date   HGBA1C 10.2 (H) 06/06/2018   HGBA1C 8.4 (A) 02/17/2018   HGBA1C 8.4 (H) 09/05/2017   Lab Results  Component Value Date   MICROALBUR 3.8 (H) 06/06/2018    LDLCALC 52 10/21/2017   CREATININE 0.91 06/06/2018    Lab on 06/06/2018  Component Date Value Ref Range Status  . Hgb A1c MFr Bld 06/06/2018 10.2* 4.6 - 6.5 % Final   Glycemic Control Guidelines for People with Diabetes:Non Diabetic:  <6%Goal of Therapy: <7%Additional Action Suggested:  >8%   . Sodium 06/06/2018 137  135 - 145 mEq/L Final  . Potassium 06/06/2018 3.6  3.5 - 5.1 mEq/L Final  . Chloride 06/06/2018 100  96 - 112 mEq/L Final  . CO2 06/06/2018 32  19 - 32 mEq/L Final  . Glucose, Bld 06/06/2018 343* 70 - 99 mg/dL Final  . BUN 06/06/2018 15  6 - 23 mg/dL Final  . Creatinine, Ser 06/06/2018 0.91  0.40 - 1.20 mg/dL Final  . Total Bilirubin 06/06/2018 0.5  0.2 - 1.2 mg/dL Final  . Alkaline Phosphatase 06/06/2018 51  39 - 117 U/L Final  . AST 06/06/2018 16  0 - 37 U/L Final  . ALT 06/06/2018 15  0 - 35 U/L Final  . Total Protein 06/06/2018 7.0  6.0 - 8.3 g/dL Final  . Albumin 06/06/2018 3.8  3.5 - 5.2 g/dL Final  . Calcium 06/06/2018 9.9  8.4 - 10.5 mg/dL Final  . GFR 06/06/2018 62.15  >60.00 mL/min Final  . Cholesterol 06/06/2018 140  0 - 200 mg/dL Final   ATP III Classification       Desirable:  < 200 mg/dL               Borderline High:  200 - 239 mg/dL          High:  > = 240 mg/dL  . Triglycerides 06/06/2018 303.0* 0.0 - 149.0 mg/dL Final   Normal:  <150 mg/dLBorderline High:  150 - 199 mg/dL  . HDL 06/06/2018 35.60* >39.00 mg/dL Final  . VLDL 06/06/2018 60.6* 0.0 - 40.0 mg/dL Final  . Total CHOL/HDL Ratio 06/06/2018 4   Final                  Men          Women1/2 Average Risk     3.4          3.3Average Risk          5.0          4.42X Average Risk  9.6          7.13X Average Risk          15.0          11.0                      . NonHDL 06/06/2018 104.66   Final   NOTE:  Non-HDL goal should be 30 mg/dL higher than patient's LDL goal (i.e. LDL goal of < 70 mg/dL, would have non-HDL goal of < 100 mg/dL)  . Microalb, Ur 06/06/2018 3.8* 0.0 - 1.9 mg/dL Final  .  Creatinine,U 06/06/2018 75.0  mg/dL Final  . Microalb Creat Ratio 06/06/2018 5.0  0.0 - 30.0 mg/g Final  . Direct LDL 06/06/2018 45.0  mg/dL Final   Optimal:  <100 mg/dLNear or Above Optimal:  100-129 mg/dLBorderline High:  130-159 mg/dLHigh:  160-189 mg/dLVery High:  >190 mg/dL      Allergies as of 06/09/2018      Reactions   Lipitor [atorvastatin Calcium] Diarrhea   Niacin And Related Hives   Statins Diarrhea   Macrobid [nitrofurantoin Macrocrystal] Other (See Comments)   unknown      Medication List        Accurate as of 06/09/18  9:54 AM. Always use your most recent med list.          CALTRATE 600+D PO Take by mouth 2 (two) times daily.   clopidogrel 75 MG tablet Commonly known as:  PLAVIX Take 1 tablet (75 mg total) by mouth daily.   COQ10 PO Take by mouth.   denosumab 60 MG/ML Sosy injection Commonly known as:  PROLIA Inject into the skin every 6 (six) months.   Dulaglutide 0.75 MG/0.5ML Sopn INJECT INTO THE ABDOMINAL SKIN ONCE WEEKLY   FREESTYLE LIBRE 14 DAY SENSOR Misc 1 Units by Other route every 14 (fourteen) days. Use 1 unit q14d Dx: E11.9   hydrochlorothiazide 12.5 MG capsule Commonly known as:  MICROZIDE Take 1 capsule (12.5 mg total) by mouth daily.   Icosapent Ethyl 1 g Caps Take 2 capsules (2 g total) by mouth 2 (two) times daily.   insulin lispro 100 UNIT/ML KiwkPen Commonly known as:  HUMALOG Use only if BS over 150 in am   Insulin Pen Needle 31G X 8 MM Misc INJECT AS DIRECTED 4 TIMES DAILY   INSULIN SYRINGE 1CC/29G 29G X 1/2" 1 ML Misc AS DIRECTED.   losartan 100 MG tablet Commonly known as:  COZAAR TAKE 1 TABLET BY MOUTH EVERY DAY   metoprolol tartrate 50 MG tablet Commonly known as:  LOPRESSOR TAKE 1/2 TABLET  BY MOUTH TWICE DAILY   multivitamin tablet Take 1 tablet by mouth daily.   omeprazole 20 MG capsule Commonly known as:  PRILOSEC TAKE ONE CAPSULE BY MOUTH DAILY   ONE TOUCH ULTRA SYSTEM KIT w/Device Kit 1 kit by  Does not apply route once. Tests Blood sugar before meals and at bedtime   ONE TOUCH ULTRA TEST test strip Generic drug:  glucose blood USE FOUR TIMES DAILY BEFORE MEALS AND EVERY NIGHT AT BEDTIME   pyridOXINE 100 MG tablet Commonly known as:  VITAMIN B-6 Take 100 mg by mouth daily.   rosuvastatin 20 MG tablet Commonly known as:  CRESTOR Take 1 tablet (20 mg total) by mouth daily.   TOUJEO MAX SOLOSTAR 300 UNIT/ML Sopn Generic drug:  Insulin Glargine Inject 34 Units into the skin every morning.   TOUJEO SOLOSTAR 300 UNIT/ML Sopn Generic drug:  Insulin Glargine (1 Unit Dial) INJECT 46 UNITS UNDER THE SKIN DAILY       Allergies:  Allergies  Allergen Reactions  . Lipitor [Atorvastatin Calcium] Diarrhea  . Niacin And Related Hives  . Statins Diarrhea  . Macrobid [Nitrofurantoin Macrocrystal] Other (See Comments)    unknown    Past Medical History:  Diagnosis Date  . Anemia   . Diabetes mellitus type 2, insulin dependent ([place])   . Dyslipidemia   . GERD (gastroesophageal reflux disease)   . Hypertension   . Migraines   . Osteoporosis   . PVD (peripheral vascular disease) ([place])    abi .83 (L), .92 (R)  . RSD (reflex sympathetic dystrophy) 2007   R wrist/hand following fx  . Stroke ([place])   . TIA (transient ischemic attack)   . Varicose veins     Past Surgical History:  Procedure Laterality Date  . ABDOMINAL HYSTERECTOMY  1988  . APPENDECTOMY  1966  . BREAST CYST EXCISION    . CATARACT EXTRACTION  10/2009  . CHOLECYSTECTOMY  1989  . Several benign cyst removed     last 1 in 1972  . TUBAL LIGATION    . UMBILICAL HERNIA REPAIR      Family History  Problem Relation Age of Onset  . Stroke Mother 93  . Hypertension Mother   . Clotting disorder Father   . Heart attack Father   . Arrhythmia Sister   . Stroke Brother   . Diabetes Neg Hx   . Thyroid disease Neg Hx     Social History:  reports that she has never smoked. She has never used smokeless tobacco.  She reports that she does not drink alcohol or use drugs.    Review of Systems      She is on Crestor 20 mg and 4 g Vascepa but triglycerides tend to be high, followed by PCP        Lab Results  Component Value Date   CHOL 140 06/06/2018   HDL 35.60 (L) 06/06/2018   LDLCALC 52 10/21/2017   LDLDIRECT 45.0 06/06/2018   TRIG 303.0 (H) 06/06/2018   CHOLHDL 4 06/06/2018                   Thyroid:    On exam has had a stable 3 cm nodule on the right side but she has refused to consider biopsy Clinically appears to be unchanged on follow-up  Lab Results  Component Value Date   TSH 1.820 02/13/2018       The blood pressure has been managed with losartan 100 mg, HCTZ and metoprolol, Followed by PCP Blood pressure is appearing improved      BP Readings from Last 3 Encounters:  06/09/18 124/62  06/02/18 (!) 164/80  04/24/18 (!) 160/71        Diabetic neuropathy: She has a history of Numbness in her feet and toes    Last foot exam was in 06/2017 showing sensory loss  On Prolia from her PCP for osteoporosis   Physical Examination:  BP 124/62   Pulse 74   Resp 16   Wt 133 lb (60.3 kg)   SpO2 95%   BMI 22.83 kg/m    ASSESSMENT /PLAN:  Diabetes type 2, uncontrolled    See history of present illness for detailed discussion of her current management and problems identified as well as blood sugar patterns  Her A1c is significantly higher in over 10% compared to previous level of 8.4  This is now with taking no Trulicity and only Toujeo  She has been usually reluctant to take mealtime insulin Since previously her blood sugars were better with adding Trulicity this may help again Not clear why she is noncompliant with her instructions for medications even though she can see her blood sugars over 300 frequently  She agrees to start back on the Trulicity today However will need to reassess her blood sugars in about a month and consider increasing Trulicity up to 1.5  if needed In the meantime if blood sugars are before meals over 200 she can take 6 units of Humalog also We will have her increase her insulin by 6 units at least until morning sugars are below 120  Her son was also present in the office and will help with the compliance Discussed management, blood sugar targets and need for better control All questions were answered  HYPERLIPIDEMIA: Followed by PCP Triglycerides are high likely to be from poor diabetes control  Hypertension: Better blood pressure today, she will follow-up with her PCP  Counseling time on subjects discussed in assessment and plan sections is over 50% of today's 25 minute visit     Patient Instructions  Toujeo 40 unitil sugars in am until sugar in am <120  Check blood sugars on waking up 6 days a week  Also check blood sugars about 2 hours after meals and do this after different meals by rotation  Recommended blood sugar levels on waking up are 90-130 and about 2 hours after meal is 130-160  Please bring your blood sugar monitor to each visit, thank you  Trulicity every Friday  Humalog 6 at [facility] and supper if >200      Elayne Snare 06/09/2018, 9:54 AM       Note: This office note was prepared with Dragon voice recognition system technology. Any transcriptional errors that result from this process are unintentional.

## 2018-06-09 ENCOUNTER — Encounter: Payer: Self-pay | Admitting: Endocrinology

## 2018-06-09 ENCOUNTER — Other Ambulatory Visit: Payer: Self-pay | Admitting: Endocrinology

## 2018-06-09 ENCOUNTER — Ambulatory Visit (INDEPENDENT_AMBULATORY_CARE_PROVIDER_SITE_OTHER): Payer: Medicare Other | Admitting: Endocrinology

## 2018-06-09 VITALS — BP 124/62 | HR 74 | Resp 16 | Wt 133.0 lb

## 2018-06-09 DIAGNOSIS — E1165 Type 2 diabetes mellitus with hyperglycemia: Secondary | ICD-10-CM

## 2018-06-09 DIAGNOSIS — Z794 Long term (current) use of insulin: Secondary | ICD-10-CM

## 2018-06-09 DIAGNOSIS — E782 Mixed hyperlipidemia: Secondary | ICD-10-CM

## 2018-06-09 MED ORDER — DULAGLUTIDE 0.75 MG/0.5ML ~~LOC~~ SOAJ: SUBCUTANEOUS | 0 refills | Status: DC

## 2018-06-09 NOTE — Patient Instructions (Addendum)
Toujeo 40 unitil sugars in am until sugar in am <120  Check blood sugars on waking up 6 days a week  Also check blood sugars about 2 hours after meals and do this after different meals by rotation  Recommended blood sugar levels on waking up are 90-130 and about 2 hours after meal is 130-160  Please bring your blood sugar monitor to each visit, thank you  Trulicity every Friday  Humalog 6 at [facility] and supper if >200

## 2018-06-10 ENCOUNTER — Encounter: Payer: Self-pay | Admitting: Podiatry

## 2018-06-10 NOTE — Progress Notes (Signed)
Subjective: Rebecca Perkins presents today with history of neuropathy with cc of painful, mycotic toenails.  Pain is aggravated when wearing enclosed shoe gear and relieved with periodic professional debridement.  Today patient relates right great toe blister was present about 1 month ago.  She relates a scab forming and the scab came off this morning.  She relates no pain in the digit today.  Her blood sugar this morning was 236 mg/dL.  She is working to lower her A1c.  Her PCP has her on Toujeo up to 45 units for 2 days and then back to her regular dosage.  Objective:  Vascular Examination: Capillary refill time less than 3 seconds x 10 digits. Dorsalis pedis are faintly palpable bilaterally. Posterior tibial pulses are palpable bilaterally. Digital hair x 10 digits was absent. Skin temperature gradient within normal limits bilaterally.  Dermatological Examination: Skin with normal turgor texture and tone bilaterally. Toenails 1-5 b/l discolored, thick, dystrophic with subungual debris and pain with palpation to nailbeds due to thickness of nails. Hyperkeratotic lesion noted distally right second toe.  There is no erythema no edema no drainage noted.  Musculoskeletal: Muscle strength is 5 out of 5 to all muscle groups bilaterally.  Neurological: Sensation with 10 gram monofilament is diminished bilaterally. Vibratory sensation is diminished bilaterally.  Assessment: 1. Painful onychomycosis toenails 1-5 b/l 2. Peripheral neuropathy  Plan: 1. Toenails 1-5 b/l were debrided in length and girth without iatrogenic bleeding. 2. Patient to continue soft, supportive shoe gear 3. Patient to report any pedal injuries to medical professional  4. Follow up 3 months. Patient/POA to call should there be a concern in the interim.

## 2018-06-15 ENCOUNTER — Telehealth: Payer: Self-pay | Admitting: Adult Health

## 2018-06-15 NOTE — Telephone Encounter (Signed)
Patient is scheduled with Dr. Lannette Donath  In [facility] .Jan 7 th at 9:30 am Family is aware . Telephone [phone] Neuropsychological evaluation . Thanks Hinton Dyer .

## 2018-07-06 ENCOUNTER — Other Ambulatory Visit: Payer: Self-pay

## 2018-07-06 MED ORDER — INSULIN PEN NEEDLE 31G X 8 MM MISC: 0 refills | Status: DC

## 2018-07-07 ENCOUNTER — Other Ambulatory Visit: Payer: Self-pay | Admitting: Endocrinology

## 2018-07-13 ENCOUNTER — Encounter: Payer: Medicare Other | Admitting: Psychology

## 2018-07-17 ENCOUNTER — Encounter: Payer: Self-pay | Admitting: Endocrinology

## 2018-07-17 ENCOUNTER — Ambulatory Visit: Payer: Medicare Other | Admitting: Endocrinology

## 2018-07-17 VITALS — BP 126/82 | HR 83 | Ht 64.0 in | Wt 135.2 lb

## 2018-07-17 DIAGNOSIS — E1165 Type 2 diabetes mellitus with hyperglycemia: Secondary | ICD-10-CM | POA: Diagnosis not present

## 2018-07-17 DIAGNOSIS — Z794 Long term (current) use of insulin: Secondary | ICD-10-CM

## 2018-07-17 LAB — BASIC METABOLIC PANEL
BUN: 18 mg/dL (ref 6–23)
CO2: 31 mEq/L (ref 19–32)
Calcium: 10.2 mg/dL (ref 8.4–10.5)
Chloride: 103 mEq/L (ref 96–112)
Creatinine, Ser: 0.92 mg/dL (ref 0.40–1.20)
GFR: 61.36 mL/min (ref >60.00)
Glucose, Bld: 210 mg/dL — ABNORMAL HIGH (ref 70–99)
Potassium: 3.8 mEq/L (ref 3.5–5.1)
Sodium: 140 mEq/L (ref 135–145)

## 2018-07-17 NOTE — Patient Instructions (Addendum)
Check blood sugars on waking up 7 days a week  Also check blood sugars about 2 hours after meals and do this after different meals by rotation  Recommended blood sugar levels on waking up are 90-130 and about 2 hours after meal is 130-160  Please bring your blood sugar monitor to each visit, thank you  Do not take HUMALOG unless blood sugar is over 200 and starting to eat a full meal  Avoid regular soft drinks  Reduce TOUJEO to 38 units  On Friday take 2 shots of the TRULICITY at the same time Starting on Friday reduce the TOUJEO down to 34 units   Note: The next prescription will be for the higher dose of 1.5 mg, 1 dose weekly

## 2018-07-17 NOTE — Progress Notes (Signed)
Patient ID: Rebecca Perkins, female   DOB: Dec 19, 1928, 84 y.o.   MRN: [id number]            Reason for Appointment: Follow-up for Type 2 Diabetes  Referring physician: [place]  History of Present Illness:          Diagnosis: Type 2 diabetes mellitus, date of diagnosis: 2006       Past history:  At the time of diagnosis she was feeling tired and weak but does not know what her initial blood sugar was. She probably was tried on metformin initially but because of GI side effects discussed not be continued Also did not tolerate Actos because of nausea. She thinks that she was started on insulin within a few months of her initial diagnosis  Not clear what insulin she was trying in the beginning but she thinks it was NovoLog; however for the last few years has been on Lantus only Her blood sugars have been persistently poorly controlled with A1c usually 8-9% about 2011 At initial consultation she was taking 60 units of Lantus insulin without any mealtime coverage On her visit in 3/17 because of her high A1c she was advised to start taking mealtime insulin at suppertime  In 09/2016 because of A1c being 9.9 she was told to start Victoza in addition to her basal bolus insulin regimen  Recent history:   INSULIN regimen is: 34 units Toujeo in a.m., Humalog: None  Non-insulin hypoglycemic drugs the patient is taking are: Taking Trulicity 3.42 mg weekly:     Her previous A1c was 10.2  Current management, continuous glucose monitoring interpretation is also problems identified:   She is not using the freestyle libre sensor consistently and only about three quarters of the time  She was told to start Trulicity on the last visit because of her overall readings being well over 200 and having postprandial hyperglycemia  She is not able to take Humalog as directed to proactively cover her mealtime hypoglycemia  Blood sugar patterns and averages are discussed below  Although her blood sugars  are better recently they are still averaging over 180 and may be 329 after eating  On a daily basis her blood sugars are variable with some significantly high readings midday and afternoon and otherwise in the evenings to a variable extent  However blood sugars are mostly higher late at night with the highest average between 10 PM-midnight  On average glucose readings drop fairly significantly overnight  She is mostly drinking only diet drinks  Overall since she has difficulty remembering not clear what foods are affecting her blood sugar reading  About 15 days ago she had a significant hypoglycemia around 10-11 AM and felt weak and dizzy, treated by her daughter and she does not know why blood sugar dropped to below 40.  The patient thinks she did not take Humalog by mistake instead of her Toujeo and blood sugar had dropped after breakfast  She is not clear how often she takes Humalog, was told to take it at mealtimes if blood sugars are over 200  Meals: 2-3 meals per day. Breakfast is variable, bacon, fruit, egg    Dinner usually 7 pm      Side effects from medications have been: Metformin, Actos: nausea, diarrhea    Compliance with the medical regimen: Fair  Hypoglycemia: None  but she feels a little hypoglycemic when blood sugars are around 80  Glucose monitoring:          Glucometer:  Freestyle libre    CGM use % of time  77  Average and SD 181 +/-52, was 222  Time in range      49 %, was 24  % Time Above 180  51  % Time above 250  9  % Time Below target 0     PRE-MEAL Fasting Lunch Dinner Bedtime Overall  Glucose range:       Mean/median:  133   179  213    POST-MEAL PC Breakfast PC Lunch PC Dinner  Glucose range:     Mean/median:  181  200  110   .       She will snack on peanut butter crackers or some low-fat trail mix at lunch, sometimes will eat a sandwich at lunch            Exercise: none  Dietician visit, most recent none               Weight history:  Highest weight has been 170  Wt Readings from Last 3 Encounters:  07/17/18 135 lb 3.2 oz (61.3 kg)  06/09/18 133 lb (60.3 kg)  06/02/18 139 lb (63 kg)    Glycemic control:   Lab Results  Component Value Date   HGBA1C 10.2 (H) 06/06/2018   HGBA1C 8.4 (A) 02/17/2018   HGBA1C 8.4 (H) 09/05/2017   Lab Results  Component Value Date   MICROALBUR 3.8 (H) 06/06/2018   LDLCALC 52 10/21/2017   CREATININE 0.92 07/17/2018    Office Visit on 07/17/2018  Component Date Value Ref Range Status  . Sodium 07/17/2018 140  135 - 145 mEq/L Final  . Potassium 07/17/2018 3.8  3.5 - 5.1 mEq/L Final  . Chloride 07/17/2018 103  96 - 112 mEq/L Final  . CO2 07/17/2018 31  19 - 32 mEq/L Final  . Glucose, Bld 07/17/2018 210* 70 - 99 mg/dL Final  . BUN 07/17/2018 18  6 - 23 mg/dL Final  . Creatinine, Ser 07/17/2018 0.92  0.40 - 1.20 mg/dL Final  . Calcium 07/17/2018 10.2  8.4 - 10.5 mg/dL Final  . GFR 07/17/2018 61.36  >60.00 mL/min Final      Allergies as of 07/17/2018      Reactions   Lipitor [atorvastatin Calcium] Diarrhea   Niacin And Related Hives   Statins Diarrhea   Macrobid [nitrofurantoin Macrocrystal] Other (See Comments)   unknown      Medication List       Accurate as of July 17, 2018  8:38 PM. Always use your most recent med list.        CALTRATE 600+D PO Take by mouth 2 (two) times daily.   clopidogrel 75 MG tablet Commonly known as:  PLAVIX Take 1 tablet (75 mg total) by mouth daily.   COQ10 PO Take by mouth.   denosumab 60 MG/ML Sosy injection Commonly known as:  PROLIA Inject into the skin every 6 (six) months.   FREESTYLE LIBRE 14 DAY SENSOR Misc USE EVERY 14 DAYS   hydrochlorothiazide 12.5 MG capsule Commonly known as:  MICROZIDE Take 1 capsule (12.5 mg total) by mouth daily.   Icosapent Ethyl 1 g Caps Commonly known as:  VASCEPA Take 2 capsules (2 g total) by mouth 2 (two) times daily.   insulin lispro 100 UNIT/ML KiwkPen Commonly known as:   HUMALOG KWIKPEN Use only if BS over 150 in am   Insulin Pen Needle 31G X 8 MM Misc Commonly known as:  B-D ULTRAFINE III SHORT  PEN INJECT AS DIRECTED 4 TIMES DAILY   INSULIN SYRINGE 1CC/29G 29G X 1/2" 1 ML Misc AS DIRECTED.   losartan 100 MG tablet Commonly known as:  COZAAR TAKE 1 TABLET BY MOUTH EVERY DAY   metoprolol tartrate 50 MG tablet Commonly known as:  LOPRESSOR TAKE 1/2 TABLET  BY MOUTH TWICE DAILY   multivitamin tablet Take 1 tablet by mouth daily.   omeprazole 20 MG capsule Commonly known as:  PRILOSEC TAKE ONE CAPSULE BY MOUTH DAILY   ONE TOUCH ULTRA SYSTEM KIT w/Device Kit 1 kit by Does not apply route once. Tests Blood sugar before meals and at bedtime   ONE TOUCH ULTRA TEST test strip Generic drug:  glucose blood USE FOUR TIMES DAILY BEFORE MEALS AND EVERY NIGHT AT BEDTIME   pyridOXINE 100 MG tablet Commonly known as:  VITAMIN B-6 Take 100 mg by mouth daily.   rosuvastatin 20 MG tablet Commonly known as:  CRESTOR Take 1 tablet (20 mg total) by mouth daily.   TOUJEO MAX SOLOSTAR 300 UNIT/ML Sopn Generic drug:  Insulin Glargine Inject 34 Units into the skin every morning.   TOUJEO SOLOSTAR 300 UNIT/ML Sopn Generic drug:  Insulin Glargine (1 Unit Dial) INJECT 46 UNITS UNDER THE SKIN DAILY   TRULICITY 0.37 VO/3.6GO Sopn Generic drug:  Dulaglutide INJECT 0.5 MLS INTO THE ABDOMINAL SKIN ONCE WEEKLY       Allergies:  Allergies  Allergen Reactions  . Lipitor [Atorvastatin Calcium] Diarrhea  . Niacin And Related Hives  . Statins Diarrhea  . Macrobid [Nitrofurantoin Macrocrystal] Other (See Comments)    unknown    Past Medical History:  Diagnosis Date  . Anemia   . Diabetes mellitus type 2, insulin dependent ([place])   . Dyslipidemia   . GERD (gastroesophageal reflux disease)   . Hypertension   . Migraines   . Osteoporosis   . PVD (peripheral vascular disease) ([place])    abi .83 (L), .92 (R)  . RSD (reflex sympathetic dystrophy) 2007   R  wrist/hand following fx  . Stroke ([place])   . TIA (transient ischemic attack)   . Varicose veins     Past Surgical History:  Procedure Laterality Date  . ABDOMINAL HYSTERECTOMY  1988  . APPENDECTOMY  1966  . BREAST CYST EXCISION    . CATARACT EXTRACTION  10/2009  . CHOLECYSTECTOMY  1989  . Several benign cyst removed     last 1 in 1972  . TUBAL LIGATION    . UMBILICAL HERNIA REPAIR      Family History  Problem Relation Age of Onset  . Stroke Mother 65  . Hypertension Mother   . Clotting disorder Father   . Heart attack Father   . Arrhythmia Sister   . Stroke Brother   . Diabetes Neg Hx   . Thyroid disease Neg Hx     Social History:  reports that she has never smoked. She has never used smokeless tobacco. She reports that she does not drink alcohol or use drugs.    Review of Systems      She is on Crestor 20 mg and 4 g Vascepa but triglycerides tend to be high, followed by PCP        Lab Results  Component Value Date   CHOL 140 06/06/2018   HDL 35.60 (L) 06/06/2018   LDLCALC 52 10/21/2017   LDLDIRECT 45.0 06/06/2018   TRIG 303.0 (H) 06/06/2018   CHOLHDL 4 06/06/2018  Thyroid:    On exam has had a stable 3 cm nodule on the right side but she has refused to consider biopsy This has not increased in size on exams  Lab Results  Component Value Date   TSH 1.820 02/13/2018       The blood pressure has been managed with losartan 100 mg, HCTZ and metoprolol, Followed by PCP Blood pressure is recently controlled      BP Readings from Last 3 Encounters:  07/17/18 126/82  06/09/18 124/62  06/02/18 (!) 164/80        Diabetic neuropathy: She has a history of Numbness in her feet and toes    Last foot exam was in 06/2017 showing sensory loss  On Prolia from her PCP for osteoporosis   Physical Examination:  BP 126/82 (BP Location: Left Arm, Patient Position: Sitting, Cuff Size: Normal)   Pulse 83   Ht _0  (1.626 m)   Wt 135 lb 3.2 oz  (61.3 kg)   SpO2 94%   BMI 23.21 kg/m    ASSESSMENT /PLAN:  Diabetes type 2, uncontrolled with neuropathy     See history of present illness for detailed discussion of her current management and problems identified as well as blood sugar patterns  Her A1c is on the last visit higher, over 10% compared to previous level of 8.4  No recent labs available to evaluate control  She is doing better with adding Trulicity to her Toujeo insulin As discussed above she is not able to reliably take Humalog when she would need to to control her postprandial hyperglycemia Average blood sugar after meals especially lunch and dinner is usually just over 200 However fasting blood sugars can be near normal and today only 78, this depends on her diet the night before Although she did have an episode of hypoglycemia midmorning it is possible she may have taken Humalog in a large dose because of the symptomatic drop in her blood sugar which is not otherwise occurring with Toujeo alone  Recommendations:  Since she is benefiting from Trulicity and currently has no nausea with this will increase the dose to 1.5 mg  On her next upcoming dose she will take 2 injections of the 0.75 mg  We will start decreasing her Toujeo, to go to 4 units less from tomorrow and on Friday reduce it down to 34  Discussed when she can take Humalog  She will make sure he does not mix up and take Humalog instead of Toujeo in the morning  To call if blood sugars are consistently controlled  Recommended moderating on high carbohydrate and high fat foods and snacks  Check fructosamine today  HYPERLIPIDEMIA: Followed by PCP Triglycerides are high  Renal function will be assessed today  Counseling time on subjects discussed in assessment and plan sections is over 50% of today's 25 minute visit     Patient Instructions  Check blood sugars on waking up 7 days a week  Also check blood sugars about 2 hours after meals  and do this after different meals by rotation  Recommended blood sugar levels on waking up are 90-130 and about 2 hours after meal is 130-160  Please bring your blood sugar monitor to each visit, thank you  Do not take HUMALOG unless blood sugar is over 200 and starting to eat a full meal  Avoid regular soft drinks  Reduce TOUJEO to 38 units  On Friday take 2 shots of the TRULICITY at the same time  Starting on Friday reduce the TOUJEO down to 34 units   Note: The next prescription will be for the higher dose of 1.5 mg, 1 dose weekly    Elayne Snare 07/17/2018, 8:38 PM       Note: This office note was prepared with Dragon voice recognition system technology. Any transcriptional errors that result from this process are unintentional.

## 2018-07-18 LAB — FRUCTOSAMINE: Fructosamine: 333 umol/L — ABNORMAL HIGH (ref 0–285)

## 2018-07-23 ENCOUNTER — Other Ambulatory Visit: Payer: Self-pay | Admitting: Family Medicine

## 2018-07-23 ENCOUNTER — Other Ambulatory Visit: Payer: Self-pay | Admitting: Endocrinology

## 2018-07-24 ENCOUNTER — Other Ambulatory Visit: Payer: Self-pay

## 2018-07-24 ENCOUNTER — Telehealth: Payer: Self-pay | Admitting: Endocrinology

## 2018-07-24 MED ORDER — DULAGLUTIDE 1.5 MG/0.5ML ~~LOC~~ SOAJ: 1.5000 mg | SUBCUTANEOUS | 3 refills | Status: DC

## 2018-07-24 NOTE — Telephone Encounter (Signed)
TRULICITY 0.10 AU/4.5VP SOPN  Tye [place] (daughter) is calling in regards to patients medication. She stated Dr Dwyane Dee was increasing the patient medication but when speaking to the pharmacy they told them that there was no change made to this.   Requesting a call back  I have check patients Release of information and I do not see the daughter on it. Just the two sons  Tye [place] [phone]

## 2018-07-24 NOTE — Telephone Encounter (Signed)
Reviewed pt's last office visit note. I see where it is noted that Dr. Dwyane Dee stated that he would be increasing the pt's dose of Trulicity 0.75mg  to 1.5mg  weekly. This new RX was sent to the pharmacy and pt called and notified. Left detailed voicemail for pt with info.

## 2018-08-01 LAB — HM DIABETES EYE EXAM

## 2018-08-22 ENCOUNTER — Other Ambulatory Visit: Payer: Self-pay | Admitting: Family Medicine

## 2018-08-23 ENCOUNTER — Encounter: Payer: Self-pay | Admitting: Podiatry

## 2018-08-23 ENCOUNTER — Ambulatory Visit: Payer: Medicare Other | Admitting: Podiatry

## 2018-08-23 DIAGNOSIS — M79674 Pain in right toe(s): Secondary | ICD-10-CM | POA: Diagnosis not present

## 2018-08-23 DIAGNOSIS — L84 Corns and callosities: Secondary | ICD-10-CM | POA: Diagnosis not present

## 2018-08-23 DIAGNOSIS — B351 Tinea unguium: Secondary | ICD-10-CM

## 2018-08-23 DIAGNOSIS — G609 Hereditary and idiopathic neuropathy, unspecified: Secondary | ICD-10-CM | POA: Diagnosis not present

## 2018-08-23 DIAGNOSIS — M79675 Pain in left toe(s): Secondary | ICD-10-CM

## 2018-08-23 MED ORDER — NONFORMULARY OR COMPOUNDED ITEM: 5 refills | Status: AC

## 2018-08-23 NOTE — Patient Instructions (Addendum)
Onychomycosis/Fungal Toenails  WHAT IS IT? An infection that lies within the keratin of your nail plate that is caused by a fungus.  WHY ME? Fungal infections affect all ages, sexes, races, and creeds.  There may be many factors that predispose you to a fungal infection such as age, coexisting medical conditions such as diabetes, or an autoimmune disease; stress, medications, fatigue, genetics, etc.  Bottom line: fungus thrives in a warm, moist environment and your shoes offer such a location.  IS IT CONTAGIOUS? Theoretically, yes.  You do not want to share shoes, nail clippers or files with someone who has fungal toenails.  Walking around barefoot in the same room or sleeping in the same bed is unlikely to transfer the organism.  It is important to realize, however, that fungus can spread easily from one nail to the next on the same foot.  HOW DO WE TREAT THIS?  There are several ways to treat this condition.  Treatment may depend on many factors such as age, medications, pregnancy, liver and kidney conditions, etc.  It is best to ask your doctor which options are available to you.  1. No treatment.   Unlike many other medical concerns, you can live with this condition.  However for many people this can be a painful condition and may lead to ingrown toenails or a bacterial infection.  It is recommended that you keep the nails cut short to help reduce the amount of fungal nail. 2. Topical treatment.  These range from herbal remedies to prescription strength nail lacquers.  About 40-50% effective, topicals require twice daily application for approximately 9 to 12 months or until an entirely new nail has grown out.  The most effective topicals are medical grade medications available through physicians offices. 3. Oral antifungal medications.  With an 80-90% cure rate, the most common oral medication requires 3 to 4 months of therapy and stays in your system for a year as the new nail grows out.  Oral  antifungal medications do require blood work to make sure it is a safe drug for you.  A liver function panel will be performed prior to starting the medication and after the first month of treatment.  It is important to have the blood work performed to avoid any harmful side effects.  In general, this medication safe but blood work is required. 4. Laser Therapy.  This treatment is performed by applying a specialized laser to the affected nail plate.  This therapy is noninvasive, fast, and non-painful.  It is not covered by insurance and is therefore, out of pocket.  The results have been very good with a 80-95% cure rate.  [facility] is the only practice in the area to offer this therapy. 5. Permanent Nail Avulsion.  Removing the entire nail so that a new nail will not grow back.  Diabetes Mellitus and Foot Care Foot care is an important part of your health, especially when you have diabetes. Diabetes may cause you to have problems because of poor blood flow (circulation) to your feet and legs, which can cause your skin to:  Become thinner and drier.  Break more easily.  Heal more slowly.  Peel and crack. You may also have nerve damage (neuropathy) in your legs and feet, causing decreased feeling in them. This means that you may not notice minor injuries to your feet that could lead to more serious problems. Noticing and addressing any potential problems early is the best way to prevent   future foot problems. How to care for your feet Foot hygiene  Wash your feet daily with warm water and mild soap. Do not use hot water. Then, pat your feet and the areas between your toes until they are completely dry. Do not soak your feet as this can dry your skin.  Trim your toenails straight across. Do not dig under them or around the cuticle. File the edges of your nails with an emery board or nail file.  Apply a moisturizing lotion or petroleum jelly to the skin on your feet and to dry, brittle  toenails. Use lotion that does not contain alcohol and is unscented. Do not apply lotion between your toes. Shoes and socks  Wear clean socks or stockings every day. Make sure they are not too tight. Do not wear knee-high stockings since they may decrease blood flow to your legs.  Wear shoes that fit properly and have enough cushioning. Always look in your shoes before you put them on to be sure there are no objects inside.  To break in new shoes, wear them for just a few hours a day. This prevents injuries on your feet. Wounds, scrapes, corns, and calluses  Check your feet daily for blisters, cuts, bruises, sores, and redness. If you cannot see the bottom of your feet, use a mirror or ask someone for help.  Do not cut corns or calluses or try to remove them with medicine.  If you find a minor scrape, cut, or break in the skin on your feet, keep it and the skin around it clean and dry. You may clean these areas with mild soap and water. Do not clean the area with peroxide, alcohol, or iodine.  If you have a wound, scrape, corn, or callus on your foot, look at it several times a day to make sure it is healing and not infected. Check for: ? Redness, swelling, or pain. ? Fluid or blood. ? Warmth. ? Pus or a bad smell. General instructions  Do not cross your legs. This may decrease blood flow to your feet.  Do not use heating pads or hot water bottles on your feet. They may burn your skin. If you have lost feeling in your feet or legs, you may not know this is happening until it is too late.  Protect your feet from hot and cold by wearing shoes, such as at the beach or on hot pavement.  Schedule a complete foot exam at least once a year (annually) or more often if you have foot problems. If you have foot problems, report any cuts, sores, or bruises to your health care provider immediately. Contact a health care provider if:  You have a medical condition that increases your risk of  infection and you have any cuts, sores, or bruises on your feet.  You have an injury that is not healing.  You have redness on your legs or feet.  You feel burning or tingling in your legs or feet.  You have pain or cramps in your legs and feet.  Your legs or feet are numb.  Your feet always feel cold.  You have pain around a toenail. Get help right away if:  You have a wound, scrape, corn, or callus on your foot and: ? You have pain, swelling, or redness that gets worse. ? You have fluid or blood coming from the wound, scrape, corn, or callus. ? Your wound, scrape, corn, or callus feels warm to the touch. ? You   have pus or a bad smell coming from the wound, scrape, corn, or callus. ? You have a fever. ? You have a red line going up your leg. Summary  Check your feet every day for cuts, sores, red spots, swelling, and blisters.  Moisturize feet and legs daily.  Wear shoes that fit properly and have enough cushioning.  If you have foot problems, report any cuts, sores, or bruises to your health care provider immediately.  Schedule a complete foot exam at least once a year (annually) or more often if you have foot problems. This information is not intended to replace advice given to you by your health care provider. Make sure you discuss any questions you have with your health care provider. Document Released: 07/16/2000 Document Revised: 08/31/2017 Document Reviewed: 08/20/2016 Elsevier Interactive Patient Education  2019 [employer]  Diabetic Neuropathy Diabetic neuropathy refers to nerve damage that is caused by diabetes (diabetes mellitus). Over time, people with diabetes can develop nerve damage throughout the body. There are several types of diabetic neuropathy:  Peripheral neuropathy. This is the most common type of diabetic neuropathy. It causes damage to nerves that carry signals between the spinal cord and other parts of the body (peripheral nerves). This usually  affects nerves in the feet and legs first, and may eventually affect the hands and arms. The damage affects the ability to sense touch or temperature.  Autonomic neuropathy. This type causes damage to nerves that control involuntary functions (autonomic nerves). These nerves carry signals that control: ? Heartbeat. ? Body temperature. ? Blood pressure. ? Urination. ? Digestion. ? Sweating. ? Sexual function. ? Response to changing blood sugar (glucose) levels.  Focal neuropathy. This type of nerve damage affects one area of the body, such as an arm, a leg, or the face. The injury may involve one nerve or a small group of nerves. Focal neuropathy can be painful and unpredictable, and occurs most often in older adults with diabetes. This often develops suddenly, but usually improves over time and does not cause long-term problems.  Proximal neuropathy. This type of nerve damage affects the nerves of the thighs, hips, buttocks, or legs. It causes severe pain, weakness, and muscle death (atrophy), usually in the thigh muscles. It is more common among older men and people who have type 2 diabetes. The length of recovery time may vary. What are the causes? Peripheral, autonomic, and focal neuropathies are caused by diabetes that is not well controlled with treatment. The cause of proximal neuropathy is not known, but it may be caused by inflammation related to uncontrolled blood glucose levels. What are the signs or symptoms? Peripheral neuropathy Peripheral neuropathy develops slowly over time. When the nerves of the feet and legs no longer work, you may experience:  Burning, stabbing, or aching pain in the legs or feet.  Pain or cramping in the legs or feet.  Loss of feeling (numbness) and inability to feel pressure or pain in the feet. This can lead to: ? Thick calluses or sores on areas of constant pressure. ? Ulcers. ? Reduced ability to feel temperature changes.  Foot  deformities.  Muscle weakness.  Loss of balance or coordination. Autonomic neuropathy The symptoms of autonomic neuropathy vary depending on which nerves are affected. Symptoms may include:  Problems with digestion, such as: ? Nausea or vomiting. ? Poor appetite. ? Bloating. ? Diarrhea or constipation. ? Trouble swallowing. ? Losing weight without trying to.  Problems with the heart, blood and lungs, such as: ?   Dizziness, especially when standing up. ? Fainting. ? Shortness of breath. ? Irregular heartbeat.  Bladder problems, such as: ? Trouble starting or stopping urination. ? Leaking urine. ? Trouble emptying the bladder. ? Urinary tract infections (UTIs).  Problems with other body functions, such as: ? Sweat. You may sweat too much or too little. ? Temperature. You might get hot easily. Or, you might feel cold more than usual. ? Sexual function. Men may not be able to get or maintain an erection. Women may have vaginal dryness and difficulty with arousal. Focal neuropathy Symptoms affect only one area of the body. Common symptoms include:  Numbness.  Tingling.  Burning pain.  Prickling feeling.  Very sensitive skin.  Weakness.  Inability to move (paralysis).  Muscle twitching.  Muscles getting smaller (wasting).  Poor coordination.  Double or blurred vision. Proximal neuropathy  Sudden, severe pain in the hip, thigh, or buttocks. Pain may spread from the back into the legs (sciatica).  Pain and numbness in the arms and legs.  Tingling.  Loss of bladder control or bowel control.  Weakness and wasting of thigh muscles.  Difficulty getting up from a seated position.  Abdominal swelling.  Unexplained weight loss. How is this diagnosed? Diagnosis usually involves reviewing your medical history and any symptoms you have. Diagnosis varies depending on the type of neuropathy your health care provider suspects. Peripheral neuropathy Your health  care provider will check areas that are affected by your nervous system (neurologic exam), such as your reflexes, how you move, and what you can feel. You may have other tests, such as:  Blood tests.  Removal and examination of fluid that surrounds the spinal cord (lumbar puncture).  CT scan.  MRI.  A test to check the nerves that control muscles (electromyogram, EMG).  Tests of how quickly messages pass through your nerves (nerve conduction velocity tests).  Removal of a small piece of nerve to be examined under a microscope (biopsy). Autonomic neuropathy You may have tests, such as:  Tests to measure your blood pressure and heart rate. This may include monitoring you while you are safely secured to an exam table that moves you from a lying position to an upright position (table tilt test).  Breathing tests to check your lungs.  Tests to check how food moves through the digestive system (gastric emptying tests).  Blood, sweat, or urine tests.  Ultrasound of your bladder.  Spinal fluid tests. Focal neuropathy This condition may be diagnosed with:  A neurologic exam.  CT scan.  MRI.  EMG.  Nerve conduction velocity tests. Proximal neuropathy There is no test to diagnose this type of neuropathy. You may have tests to rule out other possible causes of this type of neuropathy. Tests may include:  X-rays of your spine and lumbar region.  Lumbar puncture.  MRI. How is this treated? The goal of treatment is to keep nerve damage from getting worse. The most important part of treatment is keeping your blood glucose level and your A1C level within your target range by following your diabetes management plan. Over time, maintaining lower blood glucose levels helps lessen symptoms. In some cases, you may need prescription pain medicine. Follow these instructions at home:  Lifestyle   Do not use any products that contain nicotine or tobacco, such as cigarettes and  e-cigarettes. If you need help quitting, ask your health care provider.  Be physically active every day. Include strength training and balance exercises.  Follow a healthy meal plan.    Work with your health care provider to manage your blood pressure. General instructions  Follow your diabetes management plan as directed. ? Check your blood glucose levels as directed by your health care provider. ? Keep your blood glucose in your target range as directed by your health care provider. ? Have your A1C level checked at least two times a year, or as often as told by your health care provider.  Take over the counter and prescription medicines only as told by your health care provider. This includes insulin and diabetes medicine.  Do not drive or use heavy machinery while taking prescription pain medicines.  Check your skin and feet every day for cuts, bruises, redness, blisters, or sores.  Keep all follow up visits as told by your health care provider. This is important. Contact a health care provider if:  You have burning, stabbing, or aching pain in your legs or feet.  You are unable to feel pressure or pain in your feet.  You develop problems with digestion, such as: ? Nausea. ? Vomiting. ? Bloating. ? Constipation. ? Diarrhea. ? Abdominal pain.  You have difficulty with urination, such as inability: ? To control when you urinate (incontinence). ? To completely empty the bladder (retention).  You have palpitations.  You feel dizzy, weak, or faint when you stand up. Get help right away if:  You cannot urinate.  You have sudden weakness or loss of coordination.  You have trouble speaking.  You have pain or pressure in your chest.  You have an irregular heart beat.  You have sudden inability to move a part of your body. Summary  Diabetic neuropathy refers to nerve damage that is caused by diabetes. It can affect nerves throughout the entire body, causing numbness  and pain in the arms, legs, digestive tract, heart, and other body systems.  Keep your blood glucose level and your blood pressure in your target range, as directed by your health care provider. This can help prevent neuropathy from getting worse.  Check your skin and feet every day for cuts, bruises, redness, blisters, or sores.  Do not use any products that contain nicotine or tobacco, such as cigarettes and e-cigarettes. If you need help quitting, ask your health care provider. This information is not intended to replace advice given to you by your health care provider. Make sure you discuss any questions you have with your health care provider. Document Released: 09/27/2001 Document Revised: 08/31/2017 Document Reviewed: 08/23/2016 Elsevier Interactive Patient Education  2019 [employer]  

## 2018-09-04 NOTE — Progress Notes (Signed)
Subjective: Rebecca Perkins presents today with history of neuropathy with cc of painful, mycotic toenails.  Pain is aggravated when wearing enclosed shoe gear and relieved with periodic professional debridement.  Patient is also on the blood thinner Plavix.  [name], MD is her PCP and last visit was June 02, 2018.  She states that she continues to have numbness and burning pain in her feet daily.   Current Outpatient Medications:  .  Blood Glucose Monitoring Suppl (ONE TOUCH ULTRA SYSTEM KIT) W/DEVICE KIT, 1 kit by Does not apply route once. Tests Blood sugar before meals and at bedtime, Disp: 1 each, Rfl: 0 .  Calcium Carbonate-Vitamin D (CALTRATE 600+D PO), Take by mouth 2 (two) times daily., Disp: , Rfl:  .  clopidogrel (PLAVIX) 75 MG tablet, Take 1 tablet (75 mg total) by mouth daily., Disp: 30 tablet, Rfl: 0 .  Coenzyme Q10 (COQ10 PO), Take by mouth., Disp: , Rfl:  .  Continuous Blood Gluc Sensor (FREESTYLE LIBRE 14 DAY SENSOR) MISC, USE EVERY 14 DAYS, Disp: 2 each, Rfl: 5 .  denosumab (PROLIA) 60 MG/ML SOSY injection, Inject into the skin every 6 (six) months. , Disp: , Rfl:  .  Dulaglutide (TRULICITY) 1.5 EY/8.1KG SOPN, Inject 1.5 mg into the skin once a week. Inject 1.24m under the skin once weekly., Disp: 4 pen, Rfl: 3 .  hydrochlorothiazide (MICROZIDE) 12.5 MG capsule, Take 1 capsule (12.5 mg total) by mouth daily., Disp: 30 capsule, Rfl: 5 .  Icosapent Ethyl (VASCEPA) 1 g CAPS, Take 2 capsules (2 g total) by mouth 2 (two) times daily., Disp: 360 capsule, Rfl: 3 .  Insulin Glargine (TOUJEO MAX SOLOSTAR) 300 UNIT/ML SOPN, Inject 34 Units into the skin every morning. , Disp: , Rfl:  .  insulin lispro (HUMALOG KWIKPEN) 100 UNIT/ML KiwkPen, Use only if BS over 150 in am, Disp: 15 mL, Rfl: 1 .  Insulin Pen Needle (B-D ULTRAFINE III SHORT PEN) 31G X 8 MM MISC, INJECT AS DIRECTED 4 TIMES DAILY, Disp: 300 each, Rfl: 0 .  INSULIN SYRINGE 1CC/29G 29G X 1/2" 1 ML MISC, AS  DIRECTED., Disp: 100 each, Rfl: 3 .  losartan (COZAAR) 100 MG tablet, TAKE 1 TABLET BY MOUTH EVERY DAY (Patient taking differently: 100 mg. ), Disp: 90 tablet, Rfl: 3 .  metoprolol (LOPRESSOR) 50 MG tablet, TAKE 1/2 TABLET  BY MOUTH TWICE DAILY, Disp: 180 tablet, Rfl: 0 .  Multiple Vitamin (MULTIVITAMIN) tablet, Take 1 tablet by mouth daily.  , Disp: , Rfl:  .  omeprazole (PRILOSEC) 20 MG capsule, TAKE 1 CAPSULE BY MOUTH DAILY, Disp: 90 capsule, Rfl: 3 .  ONE TOUCH ULTRA TEST test strip, USE FOUR TIMES DAILY BEFORE MEALS AND EVERY NIGHT AT BEDTIME, Disp: 150 each, Rfl: 5 .  Pyridoxine HCl (VITAMIN B-6) 100 MG tablet, Take 100 mg by mouth daily.  , Disp: , Rfl:  .  rosuvastatin (CRESTOR) 20 MG tablet, TAKE 1 TABLET(20 MG) BY MOUTH DAILY, Disp: 90 tablet, Rfl: 3 .  TOUJEO SOLOSTAR 300 UNIT/ML SOPN, INJECT 46 UNITS UNDER THE SKIN DAILY, Disp: 21 mL, Rfl: 2 .  NONFORMULARY OR COMPOUNDED ITEM, [facility]:  Peripheral Neuropathy Cream - Bupivacaine 1%, Doxepin 3%, Gabapentin 6%, Pentoxifylline 3%, Topiramate 1%, apply 1-2 grams to affected ares 3-4 times a day prn., Disp: 100 each, Rfl: 5  Current Facility-Administered Medications:  .  denosumab (PROLIA) injection 60 mg, 60 mg, Subcutaneous, Q6 months, PSusy Frizzle MD, 60 mg at 10/21/17 1030  Allergies  Allergen Reactions  . Lipitor [Atorvastatin Calcium] Diarrhea  . Niacin And Related Hives  . Statins Diarrhea  . Macrobid [Nitrofurantoin Macrocrystal] Other (See Comments)    unknown    Objective:  Vascular Examination: Capillary refill time less than 3 seconds x 10 digits  Dorsalis pedis and Posterior tibial pulses are palpable bilaterally.  Digital hair x 10 digits was absent.  Skin temperature gradient WNL b/l.  Dermatological Examination: Skin with normal turgor, texture and tone b/l  Toenails 1-5 b/l discolored, thick, dystrophic with subungual debris and pain with palpation to nailbeds due to thickness of  nails.  Hyperkeratotic lesion noted distal tip right second toe.  There is no erythema, no edema, no drainage noted.  Musculoskeletal: Muscle strength 5/5 to all muscle groups b/l  Hammertoe deformity noted right second digit  Neurological: Sensation with 10 gram monofilament is absent b/l Vibratory sensation absent b/l  Assessment: 1. Painful onychomycosis toenails 1-5 b/l 2. Corn right second digit 3. NIDDM 4. Peripheral neuropathy  Plan: 1. Toenails 1-5 b/l were debrided in length and girth without iatrogenic bleeding. 2. Corn on the right second toe was pared without incident.  Dispensed silicone toe cap for daily wear.  She is to wear when she is wearing a shoe gear and remove in the evening. 3. We discussed her neuropathy symptoms.  She is interested in trying a compounded peripheral neuropathy cream.  A prescription was sent to [facility] for peripheral neuropathy cream which consists of: Bupivacaine 1%, doxepin 3%, gabapentin 6%, pentoxifylline 3%, and Topimarate 1%.  4. Patient to continue soft, supportive shoe gear daily. 5. Patient to report any pedal injuries to medical professional immediately. 6. Follow up 3 months. Patient/POA to call should there be a concern in the interim.

## 2018-09-15 ENCOUNTER — Other Ambulatory Visit: Payer: Self-pay | Admitting: Family Medicine

## 2018-09-15 ENCOUNTER — Telehealth: Payer: Self-pay | Admitting: Family Medicine

## 2018-09-15 NOTE — Telephone Encounter (Signed)
[place] HBP

## 2018-09-15 NOTE — Telephone Encounter (Signed)
Pt called and states that she has had a dry cough, ST, runny nose and some chest congestion x > 1 week (no fever) and would like to know if we could call something in for her or does she need in to come to be seen?

## 2018-09-18 ENCOUNTER — Ambulatory Visit: Payer: Medicare Other | Admitting: Endocrinology

## 2018-09-18 NOTE — Telephone Encounter (Signed)
Pt aware of recommendations

## 2018-09-19 ENCOUNTER — Encounter: Payer: Self-pay | Admitting: Endocrinology

## 2018-09-19 ENCOUNTER — Ambulatory Visit: Payer: Medicare Other | Admitting: Endocrinology

## 2018-09-19 VITALS — BP 130/68 | HR 84 | Ht 64.0 in | Wt 134.0 lb

## 2018-09-19 DIAGNOSIS — Z794 Long term (current) use of insulin: Secondary | ICD-10-CM

## 2018-09-19 DIAGNOSIS — E1165 Type 2 diabetes mellitus with hyperglycemia: Secondary | ICD-10-CM | POA: Diagnosis not present

## 2018-09-19 LAB — POCT GLYCOSYLATED HEMOGLOBIN (HGB A1C): Hemoglobin A1C: 7.8 % — AB (ref 4.0–5.6)

## 2018-09-19 NOTE — Progress Notes (Signed)
Patient ID: Rebecca Perkins, female   DOB: 1928-09-13, 86 y.o.   MRN: [id number]            Reason for Appointment: Follow-up for Type 2 Diabetes  Referring physician: Pickard  History of Present Illness:          Diagnosis: Type 2 diabetes mellitus, date of diagnosis: 2006       Past history:  At the time of diagnosis she was feeling tired and weak but does not know what her initial blood sugar was. She probably was tried on metformin initially but because of GI side effects discussed not be continued Also did not tolerate Actos because of nausea. She thinks that she was started on insulin within a few months of her initial diagnosis  Not clear what insulin she was trying in the beginning but she thinks it was NovoLog; however for the last few years has been on Lantus only Her blood sugars have been persistently poorly controlled with A1c usually 8-9% about 2011 At initial consultation she was taking 60 units of Lantus insulin without any mealtime coverage On her visit in 3/17 because of her high A1c she was advised to start taking mealtime insulin at suppertime  In 09/2016 because of A1c being 9.9 she was told to start Victoza in addition to her basal bolus insulin regimen  Recent history:   INSULIN regimen is: 34 units Toujeo in a.m., Humalog: None  Non-insulin hypoglycemic drugs the patient is taking are: Taking Trulicity 1.5 mg weekly:     Her previous A1c was 10.2 and is now 7.8  Current management, continuous glucose monitoring interpretation is also problems identified:   Her Trulicity was increased to 1.5 mg weekly in December  She has no nausea with this  She has been generally monitoring her blood sugars with her freestyle libre 2-4 times a day although occasionally may forget to check  Overall her blood sugars are better than before with significantly more blood sugars within target range  She does have some days with significantly truly high blood sugars  particularly late at night and rarely midday but not consistently  FASTING blood sugars are variable but generally near normal frequently  Lowest blood sugar has been 63 fasting and this was transient  Her weight is about the same  She is usually eating about the same foods for breakfast but in the morning postprandial readings can fluctuate  More recently she does not think she has taken any Humalog since she has not had extreme blood sugars when she monitors  Her family thinks that her mealtimes are variable  Meals: 2-3 meals per day. Breakfast is variable, bacon, fruit, egg    Dinner usually 7 pm      Side effects from medications have been: Metformin, Actos: nausea, diarrhea    Compliance with the medical regimen: Fair  Hypoglycemia: None  but she feels a little hypoglycemic when blood sugars are around 80  Glucose monitoring:          Glucometer:   Freestyle libre   CGM use % of time  72  2-week average/SD  153, variability 26%  Time in range      74 %, was 49  % Time Above 180  24  % Time above 250 1  % Time Below 70 1     PRE-MEAL Fasting Lunch Dinner Bedtime Overall  Glucose range: 63-140      Averages:  117  157  147  172  153   POST-MEAL PC Breakfast PC Lunch PC Dinner  Glucose range:     Averages:  162   154     She will snack on peanut butter crackers or some low-fat trail mix at lunch, sometimes will eat a sandwich at lunch            Exercise: none  Dietician visit, most recent none               Weight history: Highest weight has been 170  Wt Readings from Last 3 Encounters:  09/19/18 134 lb (60.8 kg)  07/17/18 135 lb 3.2 oz (61.3 kg)  06/09/18 133 lb (60.3 kg)    Glycemic control:   Lab Results  Component Value Date   HGBA1C 7.8 (A) 09/19/2018   HGBA1C 10.2 (H) 06/06/2018   HGBA1C 8.4 (A) 02/17/2018   Lab Results  Component Value Date   MICROALBUR 3.8 (H) 06/06/2018   LDLCALC 52 10/21/2017   CREATININE 0.92 07/17/2018    Office  Visit on 09/19/2018  Component Date Value Ref Range Status  . Hemoglobin A1C 09/19/2018 7.8* 4.0 - 5.6 % Final      Allergies as of 09/19/2018      Reactions   Lipitor [atorvastatin Calcium] Diarrhea   Niacin And Related Hives   Statins Diarrhea   Macrobid [nitrofurantoin Macrocrystal] Other (See Comments)   unknown      Medication List       Accurate as of September 19, 2018  9:00 PM. Always use your most recent med list.        CALTRATE 600+D PO Take by mouth 2 (two) times daily.   clopidogrel 75 MG tablet Commonly known as:  PLAVIX Take 1 tablet (75 mg total) by mouth daily.   COQ10 PO Take by mouth.   denosumab 60 MG/ML Sosy injection Commonly known as:  PROLIA Inject into the skin every 6 (six) months.   Dulaglutide 1.5 MG/0.5ML Sopn Commonly known as:  TRULICITY Inject 1.5 mg into the skin once a week. Inject 1.45m under the skin once weekly.   FREESTYLE LIBRE 14 DAY SENSOR Misc USE EVERY 14 DAYS   hydrochlorothiazide 12.5 MG capsule Commonly known as:  MICROZIDE Take 1 capsule (12.5 mg total) by mouth daily.   Icosapent Ethyl 1 g Caps Commonly known as:  VASCEPA Take 2 capsules (2 g total) by mouth 2 (two) times daily.   insulin lispro 100 UNIT/ML KiwkPen Commonly known as:  HUMALOG KWIKPEN Use only if BS over 150 in am   Insulin Pen Needle 31G X 8 MM Misc Commonly known as:  B-D ULTRAFINE III SHORT PEN INJECT AS DIRECTED 4 TIMES DAILY   INSULIN SYRINGE 1CC/29G 29G X 1/2" 1 ML Misc AS DIRECTED.   losartan 100 MG tablet Commonly known as:  COZAAR TAKE 1 TABLET BY MOUTH EVERY DAY   metoprolol tartrate 50 MG tablet Commonly known as:  LOPRESSOR TAKE 1/2 TABLET  BY MOUTH TWICE DAILY   multivitamin tablet Take 1 tablet by mouth daily.   NONFORMULARY OR COMPOUNDED ITEM [facility]:  Peripheral Neuropathy Cream - Bupivacaine 1%, Doxepin 3%, Gabapentin 6%, Pentoxifylline 3%, Topiramate 1%, apply 1-2 grams to affected ares 3-4 times a day  prn.   omeprazole 20 MG capsule Commonly known as:  PRILOSEC TAKE 1 CAPSULE BY MOUTH DAILY   ONE TOUCH ULTRA SYSTEM KIT w/Device Kit 1 kit by Does not apply route once. Tests Blood sugar before meals and at bedtime  ONE TOUCH ULTRA TEST test strip Generic drug:  glucose blood USE FOUR TIMES DAILY BEFORE MEALS AND EVERY NIGHT AT BEDTIME   pyridOXINE 100 MG tablet Commonly known as:  VITAMIN B-6 Take 100 mg by mouth daily.   rosuvastatin 20 MG tablet Commonly known as:  CRESTOR TAKE 1 TABLET(20 MG) BY MOUTH DAILY   TOUJEO MAX SOLOSTAR 300 UNIT/ML Sopn Generic drug:  Insulin Glargine Inject 34 Units into the skin every morning.   TOUJEO SOLOSTAR 300 UNIT/ML Sopn Generic drug:  Insulin Glargine (1 Unit Dial) INJECT 46 UNITS UNDER THE SKIN DAILY       Allergies:  Allergies  Allergen Reactions  . Lipitor [Atorvastatin Calcium] Diarrhea  . Niacin And Related Hives  . Statins Diarrhea  . Macrobid [Nitrofurantoin Macrocrystal] Other (See Comments)    unknown    Past Medical History:  Diagnosis Date  . Anemia   . Diabetes mellitus type 2, insulin dependent ([place])   . Dyslipidemia   . GERD (gastroesophageal reflux disease)   . Hypertension   . Migraines   . Osteoporosis   . PVD (peripheral vascular disease) ([place])    abi .83 (L), .92 (R)  . RSD (reflex sympathetic dystrophy) 2007   R wrist/hand following fx  . Stroke ([place])   . TIA (transient ischemic attack)   . Varicose veins     Past Surgical History:  Procedure Laterality Date  . ABDOMINAL HYSTERECTOMY  1988  . APPENDECTOMY  1966  . BREAST CYST EXCISION    . CATARACT EXTRACTION  10/2009  . CHOLECYSTECTOMY  1989  . Several benign cyst removed     last 1 in 1972  . TUBAL LIGATION    . UMBILICAL HERNIA REPAIR      Family History  Problem Relation Age of Onset  . Stroke Mother 49  . Hypertension Mother   . Clotting disorder Father   . Heart attack Father   . Arrhythmia Sister   . Stroke Brother   .  Diabetes Neg Hx   . Thyroid disease Neg Hx     Social History:  reports that she has never smoked. She has never used smokeless tobacco. She reports that she does not drink alcohol or use drugs.    Review of Systems      She is on Crestor 20 mg and 4 g Vascepa but triglycerides tend to be high, followed by PCP        Lab Results  Component Value Date   CHOL 140 06/06/2018   HDL 35.60 (L) 06/06/2018   LDLCALC 52 10/21/2017   LDLDIRECT 45.0 06/06/2018   TRIG 303.0 (H) 06/06/2018   CHOLHDL 4 06/06/2018                   Thyroid:    On exam has had a stable 3 cm nodule on the right side but she has refused to consider biopsy This has not increased in size on exams  Lab Results  Component Value Date   TSH 1.820 02/13/2018       The blood pressure has been managed with losartan 100 mg, HCTZ and metoprolol, Followed by PCP Blood pressure is consistently controlled      BP Readings from Last 3 Encounters:  09/19/18 130/68  07/17/18 126/82  06/09/18 124/62        Diabetic neuropathy: She has a history of Numbness in her feet and toes    Last foot exam was in 06/2017 showing sensory loss  On Prolia from her PCP for osteoporosis   Physical Examination:  BP 130/68 (BP Location: Left Arm, Patient Position: Sitting, Cuff Size: Normal)   Pulse 84   Ht 5' 4" (1.626 m)   Wt 134 lb (60.8 kg)   SpO2 94%   BMI 23.00 kg/m    ASSESSMENT /PLAN:  Diabetes type 2, uncontrolled with neuropathy     See history of present illness for detailed discussion of her current management and problems identified as well as blood sugar patterns  Her A1c is much better at 7.8 compared to 10.2%  With increasing her Trulicity she has done progressively better with her average blood sugar only about 150 and on her recent CGM getting 74% of her readings within target range of 70-180 No hypoglycemia No side effects with Trulicity  We will recommend that she reduce her Toujeo by 2 units  as a precaution and if morning sugars are below 102 days and now she will reduce the dose further to 30 units Written instructions given To continue checking blood sugars at various times of the day Again reminded her to avoid any regular soft drinks       Patient Instructions  Take 32 Toujeo, not 34  If am sugar <100 2x in a row reduce to [place] 09/19/2018, 9:00 PM       Note: This office note was prepared with Estate agent. Any transcriptional errors that result from this process are unintentional.

## 2018-09-19 NOTE — Patient Instructions (Signed)
Take 32 Toujeo, not 34  If am sugar <100 2x in a row reduce to 30

## 2018-09-25 ENCOUNTER — Telehealth: Payer: Self-pay

## 2018-09-25 NOTE — Telephone Encounter (Signed)
Neuropsychological evaluation notes from Dr. Rica Mote given to Janett Billow NP.

## 2018-10-19 ENCOUNTER — Telehealth: Payer: Self-pay | Admitting: Adult Health

## 2018-10-19 ENCOUNTER — Telehealth: Payer: Self-pay

## 2018-10-19 NOTE — Telephone Encounter (Signed)
Revised. 

## 2018-10-19 NOTE — Telephone Encounter (Addendum)
I called patients son about cancelling appt on Monday due to pt being high risk. THe son was asking questions about the neuro psych report and their recommendations. I gave the son what the neuro psych Md recommend. He wanted to know was pt diagnose with any dementia or alzheimer. Rebecca Perkins stated he needs a call tomorrow from [place] NP about his mom. He needs a call because his sister is leaving for [place] where she resides and needs to know too whats the plan ongoing. He was very admanted that this cannot wait till Monday. I tried to explain to pts son that Janett Billow NP will do her best. The son stated " I have to speak with Janett Billow NP tomorrow because my sister needs to know the plan for my mom. The son verbalized understanding of the appt being cancel on MOnday.Rebecca Perkins son number is [phone].

## 2018-10-19 NOTE — Telephone Encounter (Signed)
Received neuropsychological evaluation that was performed by Francee Piccolo, Ph.D. on 08/08/2018.    SUMMARY: "This patient, with a history of cerebrovascular disease, is currently functioning in the mildly impaired range of overall intellectual ability.  Her scores are much lower than that predicted by her academic and vocational history, suggesting evidence of least moderate global intellectual decline.  She has some difficulty on a test of sustained attention, especially when there is an interface component.  She shows various degrees of deficit in her memory for both auditory verbal and visual information, both at immediate recall, as well as after a delay.  She showed a mild expressive language or word finding deficit and naming but no deficit in fluency.  Comprehension appears spared.  She showed a mild deficit in her ability to perceive spatial relationships.  She has a tremendous amount difficulty on a rather simple and straightforward tasks of executive functions, especially when the task involved shifting cognitive set.  She is reporting severe anxiety and mild depression.  However, affective disorder alone would not account for all the deficits in the test battery.  She showed some degree of deficit across virtually every domain of this rather extensive battery.  This pattern is more consistent with a degenerative process rather than focal or lateralized deficit such as one might expect from a stroke."  RECOMMENDATIONS: 1.  It is recommended that the patient's physician work with her to find a medication regimen that more thoroughly addresses her severe anxiety 2.  It is recommended that the family consider having someone such as a home health aide or find a family member stay with her at night to prevent going out at 2 AM for otherwise monitoring 3.  Is recommended that the patient continue to limit her driving to very familiar routes close to home and only in daylight hours

## 2018-10-20 NOTE — Telephone Encounter (Signed)
Called son back in regards to request of information regarding neuropsychologic evaluation.  Reviewed summary and recommendations and he plans on following up with PCP in regards to addressing anxiety.  Discussion regarding potentially initiating Aricept or Namenda and he will discuss this with PCP.  No further concerns at this time.  Appreciated phone call.  He is aware to call back with any questions or concerns in the future.

## 2018-10-23 ENCOUNTER — Other Ambulatory Visit: Payer: Self-pay

## 2018-10-23 ENCOUNTER — Ambulatory Visit: Payer: Medicare Other | Admitting: Adult Health

## 2018-10-23 NOTE — Patient Outreach (Signed)
[facility]) Care Management  10/23/2018  Rebecca Perkins 05-22-1929 [id number]   Medication Adherence call to Rebecca Perkins left a message for patient to call back patient is showing past due on Trulicity under [place].   [place] Management Direct Dial [phone]  Fax [phone] [email or web address]

## 2018-10-26 ENCOUNTER — Ambulatory Visit (INDEPENDENT_AMBULATORY_CARE_PROVIDER_SITE_OTHER): Payer: Medicare Other | Admitting: *Deleted

## 2018-10-26 ENCOUNTER — Other Ambulatory Visit: Payer: Self-pay

## 2018-10-26 DIAGNOSIS — M810 Age-related osteoporosis without current pathological fracture: Secondary | ICD-10-CM

## 2018-10-26 MED ORDER — DENOSUMAB 60 MG/ML ~~LOC~~ SOSY
60.0000 mg | PREFILLED_SYRINGE | Freq: Once | SUBCUTANEOUS | Status: AC
  Administered 2018-10-26: 60 mg via SUBCUTANEOUS

## 2018-10-26 NOTE — Progress Notes (Signed)
Patient seen in office for Prolia injection.   Tolerated SQ administration in L Arm well.

## 2018-10-27 ENCOUNTER — Ambulatory Visit (INDEPENDENT_AMBULATORY_CARE_PROVIDER_SITE_OTHER): Payer: Medicare Other | Admitting: Family Medicine

## 2018-10-27 ENCOUNTER — Encounter: Payer: Self-pay | Admitting: Family Medicine

## 2018-10-27 VITALS — BP 128/64 | HR 76 | Temp 97.5°F | Resp 14 | Ht 64.0 in | Wt 133.0 lb

## 2018-10-27 DIAGNOSIS — R413 Other amnesia: Secondary | ICD-10-CM

## 2018-10-27 DIAGNOSIS — R443 Hallucinations, unspecified: Secondary | ICD-10-CM | POA: Diagnosis not present

## 2018-10-27 MED ORDER — METOPROLOL SUCCINATE ER 50 MG PO TB24: 50.0000 mg | ORAL_TABLET | Freq: Every day | ORAL | 3 refills | Status: DC

## 2018-10-27 MED ORDER — DONEPEZIL HCL 5 MG PO TABS: 5.0000 mg | ORAL_TABLET | Freq: Every day | ORAL | 1 refills | Status: DC

## 2018-10-27 NOTE — Progress Notes (Signed)
Subjective:    Patient ID: Rebecca Perkins, female    DOB: [birth year], 85 y.o.   MRN: [id number]  Patient recently had a neuropsychological evaluation.  I have copied the results below for my reference: "This patient, with a history of cerebrovascular disease, is currently functioning in the mildly impaired range of overall intellectual ability.  Her scores are much lower than that predicted by her academic and vocational history, suggesting evidence of least moderate global intellectual decline.  She has some difficulty on a test of sustained attention, especially when there is an interface component.  She shows various degrees of deficit in her memory for both auditory verbal and visual information, both at immediate recall, as well as after a delay.  She showed a mild expressive language or word finding deficit and naming but no deficit in fluency.  Comprehension appears spared.  She showed a mild deficit in her ability to perceive spatial relationships.  She has a tremendous amount difficulty on a rather simple and straightforward tasks of executive functions, especially when the task involved shifting cognitive set.  She is reporting severe anxiety and mild depression.  However, affective disorder alone would not account for all the deficits in the test battery.  She showed some degree of deficit across virtually every domain of this rather extensive battery.  This pattern is more consistent with a degenerative process rather than focal or lateralized deficit such as one might expect from a stroke."  RECOMMENDATIONS: 1.  It is recommended that the patient's physician work with her to find a medication regimen that more thoroughly addresses her severe anxiety 2.  It is recommended that the family consider having someone such as a home health aide or find a family member stay with her at night to prevent going out at 2 AM for otherwise monitoring 3.  Is recommended that the patient continue to  limit her driving to very familiar routes close to home and only in daylight hours  She is here today to discuss the results with her son as well as to discuss treatment options.  Son states that while her daughter was here staying with her, she has been doing better.  Apparently before this, the patient was having hallucinations frequently.  At night she would believe that she would see someone in the house.  She would see them walking down the hall.  She would hear them talking in another room.  At times she became so concerned that she would call her sons convinced that someone was in her home.  Since her daughter has been staying with her at night, these events have almost stopped however they have seen persistent problems with short-term memory.  Patient is still driving but only during the daytime.  She drives on 2 lane roads.  She is able today to easily answer my questions and shows normal insight and good judgment.  She shows insight into knowing that the hallucinations were not real.  However she states that the time they seemed very real.  She denies any depression.  Her anxiety that they discuss was primarily due to the fears of having someone else in the home.  However today she denies any depression or anxiety or suicidal thoughts.  Both her and her son deny any delusional thoughts.  She is eating well.  She still self administering her medication.  Past Medical History:  Diagnosis Date  . Anemia   . Diabetes mellitus type 2, insulin dependent ([place])   .  Dyslipidemia   . GERD (gastroesophageal reflux disease)   . Hypertension   . Migraines   . Osteoporosis   . PVD (peripheral vascular disease) ([place])    abi .83 (L), .92 (R)  . RSD (reflex sympathetic dystrophy) 2007   R wrist/hand following fx  . Stroke ([place])   . TIA (transient ischemic attack)   . Varicose veins    Past Surgical History:  Procedure Laterality Date  . ABDOMINAL HYSTERECTOMY  1988  . APPENDECTOMY  1966  . BREAST  CYST EXCISION    . CATARACT EXTRACTION  10/2009  . CHOLECYSTECTOMY  1989  . Several benign cyst removed     last 1 in 1972  . TUBAL LIGATION    . UMBILICAL HERNIA REPAIR     Current Outpatient Medications on File Prior to Visit  Medication Sig Dispense Refill  . Blood Glucose Monitoring Suppl (ONE TOUCH ULTRA SYSTEM KIT) W/DEVICE KIT 1 kit by Does not apply route once. Tests Blood sugar before meals and at bedtime 1 each 0  . Calcium Carbonate-Vitamin D (CALTRATE 600+D PO) Take by mouth 2 (two) times daily.    . clopidogrel (PLAVIX) 75 MG tablet Take 1 tablet (75 mg total) by mouth daily. 30 tablet 0  . Coenzyme Q10 (COQ10 PO) Take by mouth.    . Continuous Blood Gluc Sensor (FREESTYLE LIBRE 14 DAY SENSOR) MISC USE EVERY 14 DAYS 2 each 5  . denosumab (PROLIA) 60 MG/ML SOSY injection Inject into the skin every 6 (six) months.     . Dulaglutide (TRULICITY) 1.5 GY/1.7CB SOPN Inject 1.5 mg into the skin once a week. Inject 1.'5mg'$  under the skin once weekly. 4 pen 3  . hydrochlorothiazide (MICROZIDE) 12.5 MG capsule Take 1 capsule (12.5 mg total) by mouth daily. 30 capsule 5  . Icosapent Ethyl (VASCEPA) 1 g CAPS Take 2 capsules (2 g total) by mouth 2 (two) times daily. 360 capsule 3  . Insulin Glargine (TOUJEO MAX SOLOSTAR) 300 UNIT/ML SOPN Inject 34 Units into the skin every morning.     . insulin lispro (HUMALOG KWIKPEN) 100 UNIT/ML KiwkPen Use only if BS over 150 in am 15 mL 1  . Insulin Pen Needle (B-D ULTRAFINE III SHORT PEN) 31G X 8 MM MISC INJECT AS DIRECTED 4 TIMES DAILY 300 each 0  . INSULIN SYRINGE 1CC/29G 29G X 1/2" 1 ML MISC AS DIRECTED. 100 each 3  . losartan (COZAAR) 100 MG tablet TAKE 1 TABLET BY MOUTH EVERY DAY (Patient taking differently: 100 mg. ) 90 tablet 3  . metoprolol (LOPRESSOR) 50 MG tablet TAKE 1/2 TABLET  BY MOUTH TWICE DAILY 180 tablet 0  . Multiple Vitamin (MULTIVITAMIN) tablet Take 1 tablet by mouth daily.      . NONFORMULARY OR COMPOUNDED ITEM [facility]:   Peripheral Neuropathy Cream - Bupivacaine 1%, Doxepin 3%, Gabapentin 6%, Pentoxifylline 3%, Topiramate 1%, apply 1-2 grams to affected ares 3-4 times a day prn. 100 each 5  . omeprazole (PRILOSEC) 20 MG capsule TAKE 1 CAPSULE BY MOUTH DAILY 90 capsule 3  . ONE TOUCH ULTRA TEST test strip USE FOUR TIMES DAILY BEFORE MEALS AND EVERY NIGHT AT BEDTIME 150 each 5  . Pyridoxine HCl (VITAMIN B-6) 100 MG tablet Take 100 mg by mouth daily.      . rosuvastatin (CRESTOR) 20 MG tablet TAKE 1 TABLET(20 MG) BY MOUTH DAILY 90 tablet 3  . TOUJEO SOLOSTAR 300 UNIT/ML SOPN INJECT 46 UNITS UNDER THE SKIN DAILY 21 mL 2  No current facility-administered medications on file prior to visit.    Allergies  Allergen Reactions  . Lipitor [Atorvastatin Calcium] Diarrhea  . Niacin And Related Hives  . Statins Diarrhea  . Macrobid [Nitrofurantoin Macrocrystal] Other (See Comments)    unknown   Social History   Socioeconomic History  . Marital status: Widowed    Spouse name: Not on file  . Number of children: 3  . Years of education: Not on file  . Highest education level: Not on file  Occupational History  . Occupation: Retired  Scientific laboratory technician  . Financial resource strain: Not on file  . Food insecurity:    Worry: Not on file    Inability: Not on file  . Transportation needs:    Medical: Not on file    Non-medical: Not on file  Tobacco Use  . Smoking status: Never Smoker  . Smokeless tobacco: Never Used  Substance and Sexual Activity  . Alcohol use: No  . Drug use: No  . Sexual activity: Not on file  Lifestyle  . Physical activity:    Days per week: Not on file    Minutes per session: Not on file  . Stress: Not on file  Relationships  . Social connections:    Talks on phone: Not on file    Gets together: Not on file    Attends religious service: Not on file    Active member of club or organization: Not on file    Attends meetings of clubs or organizations: Not on file    Relationship status:  Not on file  . Intimate partner violence:    Fear of current or ex partner: Not on file    Emotionally abused: Not on file    Physically abused: Not on file    Forced sexual activity: Not on file  Other Topics Concern  . 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      Objective:   Physical Exam Vitals signs reviewed.  Constitutional:      Appearance: She is well-developed.  Neck:     Vascular: No JVD.  Cardiovascular:     Rate and Rhythm: Normal rate and regular rhythm.  Pulmonary:     Effort: Pulmonary effort is normal. No respiratory distress.     Breath sounds: Normal breath sounds. No stridor. No wheezing or rales.  Abdominal:     General: Bowel sounds are normal.     Palpations: Abdomen is soft.  Skin:    Findings: No erythema.           Assessment & Plan:  Memory loss  Hallucination  I spent more than 30 minutes today in the room with the patient and her son going over her exam results and discussing her treatment options.  We discussed adding a medication such as Abilify to try to decrease the frequency of hallucinations.  However at the present time we have elected to start Aricept 5 mg a day for dementia and increase to 10 mg a day in 1 month if she is tolerating the medication well.  I have recommended that family member stay with her at night.  I also recommended that family number administer the insulin to avoid improper dosing.  We will also switch her Lopressor  to Toprol-XL 50 mg once a day to avoid her missing the nighttime dose.  She frequently forgets to take her medications at night.  I believe that having a family member stay with her will prevent the delusions and hallucinations at night, decrease the frequency of sundowning, assist in keeping her more  oriented, help in administering medication.  Consider adding Namenda if dementia and memory loss worsen despite increasing Aricept to 10 mg over the next few months.

## 2018-10-29 ENCOUNTER — Other Ambulatory Visit: Payer: Self-pay | Admitting: Family Medicine

## 2018-11-01 ENCOUNTER — Ambulatory Visit: Payer: Medicare Other | Admitting: Podiatry

## 2018-11-07 ENCOUNTER — Other Ambulatory Visit: Payer: Self-pay | Admitting: Endocrinology

## 2018-11-18 ENCOUNTER — Other Ambulatory Visit: Payer: Self-pay | Admitting: Family Medicine

## 2018-11-18 ENCOUNTER — Other Ambulatory Visit: Payer: Self-pay | Admitting: Endocrinology

## 2018-11-20 ENCOUNTER — Other Ambulatory Visit: Payer: Self-pay

## 2018-11-20 ENCOUNTER — Other Ambulatory Visit: Payer: Self-pay | Admitting: Family Medicine

## 2018-11-20 NOTE — Patient Outreach (Signed)
[facility]) Care Management  11/20/2018  Rebecca Perkins 01-02-1929 [id number]   Medication Adherence call to Rebecca Perkins spoke with patient son he takes care of patient medication but did not want to engage. Rebecca Perkins is showing past due under [place].   [place] [facility] Direct Dial [phone]  Fax [phone] [email or web address]

## 2018-12-21 ENCOUNTER — Other Ambulatory Visit: Payer: Self-pay

## 2018-12-21 ENCOUNTER — Encounter: Payer: Self-pay | Admitting: Endocrinology

## 2018-12-21 ENCOUNTER — Ambulatory Visit: Payer: Medicare Other | Admitting: Endocrinology

## 2018-12-21 VITALS — BP 138/64 | HR 77 | Ht 64.0 in | Wt 133.0 lb

## 2018-12-21 DIAGNOSIS — E1165 Type 2 diabetes mellitus with hyperglycemia: Secondary | ICD-10-CM | POA: Diagnosis not present

## 2018-12-21 DIAGNOSIS — Z794 Long term (current) use of insulin: Secondary | ICD-10-CM | POA: Diagnosis not present

## 2018-12-21 DIAGNOSIS — E041 Nontoxic single thyroid nodule: Secondary | ICD-10-CM

## 2018-12-21 LAB — COMPREHENSIVE METABOLIC PANEL
ALT: 25 U/L (ref 0–35)
AST: 24 U/L (ref 0–37)
Albumin: 3.7 g/dL (ref 3.5–5.2)
Alkaline Phosphatase: 41 U/L (ref 39–117)
BUN: 17 mg/dL (ref 6–23)
CO2: 31 mEq/L (ref 19–32)
Calcium: 9.4 mg/dL (ref 8.4–10.5)
Chloride: 103 mEq/L (ref 96–112)
Creatinine, Ser: 0.97 mg/dL (ref 0.40–1.20)
GFR: 54.25 mL/min — ABNORMAL LOW (ref >60.00)
Glucose, Bld: 210 mg/dL — ABNORMAL HIGH (ref 70–99)
Potassium: 3.9 mEq/L (ref 3.5–5.1)
Sodium: 141 mEq/L (ref 135–145)
Total Bilirubin: 0.5 mg/dL (ref 0.2–1.2)
Total Protein: 6.9 g/dL (ref 6.0–8.3)

## 2018-12-21 LAB — TSH: TSH: 1.61 u[IU]/mL (ref 0.35–4.50)

## 2018-12-21 LAB — POCT GLYCOSYLATED HEMOGLOBIN (HGB A1C): Hemoglobin A1C: 7.9 % — AB (ref 4.0–5.6)

## 2018-12-21 LAB — T4, FREE: Free T4: 0.94 ng/dL (ref 0.60–1.60)

## 2018-12-21 NOTE — Progress Notes (Addendum)
Patient ID: Rebecca Perkins, female   DOB: January 26, 1929, 85 y.o.   MRN: [id number]            Reason for Appointment: Follow-up for Type 2 Diabetes  Referring physician: [place]ard  History of Present Illness:          Diagnosis: Type 2 diabetes mellitus, date of diagnosis: 2006       Past history:  At the time of diagnosis she was feeling tired and weak but does not know what her initial blood sugar was. She probably was tried on metformin initially but because of GI side effects discussed not be continued Also did not tolerate Actos because of nausea. She thinks that she was started on insulin within a few months of her initial diagnosis  Not clear what insulin she was trying in the beginning but she thinks it was NovoLog; however for the last few years has been on Lantus only Her blood sugars have been persist moderately ently poorly controlled with A1c usually 8-9% about 2011 At initial consultation she was taking 60 units of Lantus insulin without any mealtime coverage On her visit in 3/17 because of her high A1c she was advised to start taking mealtime insulin at suppertime  In 09/2016 because of A1c being 9.9 she was told to start Victoza in addition to her basal bolus insulin regimen  Recent history:   INSULIN regimen is: 31 units Toujeo in a.m., Humalog: None  Non-insulin hypoglycemic drugs the patient is taking are: Taking Trulicity 1.5 mg weekly:     Her A1c is now fairly stable at 7.9, previously 7.8  Current management and history:   Unable to get any patient home blood sugar information as she did not bring her meter for the freestyle libre  She is still fairly regular with taking her Trulicity and basal insulin  Toujeo was reduced on the last visit because of low normal sugars but for some reason she is taking 31 units instead of the recommended 30 units  Her son was accompanying her today and is able to help with her history  He does not think that she is having  any consistent hypoglycemia despite not taking Humalog  No nausea or excessive appetite reduction with Trulicity 1.5 mg  Her weight is about the same  She is usually eating relatively large meal at breakfast  No hypoglycemia reported She will snack on peanut butter crackers or some low-fat trail mix at lunchtime, sometimes will eat a sandwich at lunch  Meals: 2-3 meals per day. Breakfast is variable, bacon, fruit, egg    Dinner usually 7 pm      Side effects from medications have been: Metformin, Actos: nausea, diarrhea    Compliance with the medical regimen: Fair  Hypoglycemia: None  but she feels a little hypoglycemic when blood sugars are around 80  Glucose monitoring:          Glucometer:   Freestyle libre  By recall: After meals usually 180-200, rarely higher Fasting mostly 120   Average not available Previous average 153             Exercise: none  Dietician visit, most recent none               Weight history: Highest weight has been 170  Wt Readings from Last 3 Encounters:  12/21/18 133 lb (60.3 kg)  10/27/18 133 lb (60.3 kg)  09/19/18 134 lb (60.8 kg)    Glycemic control:   Lab  Results  Component Value Date   HGBA1C 7.9 (A) 12/21/2018   HGBA1C 7.8 (A) 09/19/2018   HGBA1C 10.2 (H) 06/06/2018   Lab Results  Component Value Date   MICROALBUR 3.8 (H) 06/06/2018   LDLCALC 52 10/21/2017   CREATININE 0.92 07/17/2018    Office Visit on 12/21/2018  Component Date Value Ref Range Status  . Hemoglobin A1C 12/21/2018 7.9* 4.0 - 5.6 % Final      Allergies as of 12/21/2018      Reactions   Lipitor [atorvastatin Calcium] Diarrhea   Niacin And Related Hives   Statins Diarrhea   Macrobid [nitrofurantoin Macrocrystal] Other (See Comments)   unknown      Medication List       Accurate as of Dec 21, 2018  1:32 PM. If you have any questions, ask your nurse or doctor.        CALTRATE 600+D PO Take by mouth 2 (two) times daily.   clopidogrel 75 MG  tablet Commonly known as:  PLAVIX Take 1 tablet (75 mg total) by mouth daily.   COQ10 PO Take by mouth.   denosumab 60 MG/ML Sosy injection Commonly known as:  PROLIA Inject into the skin every 6 (six) months.   donepezil 5 MG tablet Commonly known as:  Aricept Take 1 tablet (5 mg total) by mouth at bedtime.   FreeStyle Libre 14 Day Sensor Misc USE EVERY 14 DAYS   hydrochlorothiazide 12.5 MG capsule Commonly known as:  MICROZIDE TAKE 1 CAPSULE(12.5 MG) BY MOUTH DAILY   Icosapent Ethyl 1 g Caps Commonly known as:  Vascepa Take 2 capsules (2 g total) by mouth 2 (two) times daily.   insulin lispro 100 UNIT/ML KiwkPen Commonly known as:  HumaLOG KwikPen Use only if BS over 150 in am   Insulin Pen Needle 31G X 8 MM Misc Commonly known as:  B-D ULTRAFINE III SHORT PEN INJECT AS DIRECTED 4 TIMES DAILY   INSULIN SYRINGE 1CC/29G 29G X 1/2" 1 ML Misc AS DIRECTED.   losartan 100 MG tablet Commonly known as:  COZAAR TAKE 1 TABLET BY MOUTH EVERY DAY   metoprolol succinate 50 MG 24 hr tablet Commonly known as:  TOPROL-XL Take 1 tablet (50 mg total) by mouth daily. Take with or immediately following a meal.   metoprolol tartrate 50 MG tablet Commonly known as:  LOPRESSOR TAKE 1 TABLET BY MOUTH TWICE DAILY   multivitamin tablet Take 1 tablet by mouth daily.   NONFORMULARY OR COMPOUNDED ITEM [facility]:  Peripheral Neuropathy Cream - Bupivacaine 1%, Doxepin 3%, Gabapentin 6%, Pentoxifylline 3%, Topiramate 1%, apply 1-2 grams to affected ares 3-4 times a day prn.   omeprazole 20 MG capsule Commonly known as:  PRILOSEC TAKE 1 CAPSULE BY MOUTH DAILY   ONE TOUCH ULTRA SYSTEM KIT w/Device Kit 1 kit by Does not apply route once. Tests Blood sugar before meals and at bedtime   ONE TOUCH ULTRA TEST test strip Generic drug:  glucose blood USE FOUR TIMES DAILY BEFORE MEALS AND EVERY NIGHT AT BEDTIME   pyridOXINE 100 MG tablet Commonly known as:  VITAMIN B-6 Take 100  mg by mouth daily.   rosuvastatin 20 MG tablet Commonly known as:  CRESTOR TAKE 1 TABLET(20 MG) BY MOUTH DAILY   Toujeo Max SoloStar 300 UNIT/ML Sopn Generic drug:  Insulin Glargine Inject 34 Units into the skin every morning.   Toujeo SoloStar 300 UNIT/ML Sopn Generic drug:  Insulin Glargine (1 Unit Dial) INJECT 46 UNITS UNDER THE  SKIN DAILY   Trulicity 1.5 PY/0.9XI Sopn Generic drug:  Dulaglutide INJECT 1.'5MG'$ (0.5 ML) UNDER THE SKIN ONCE PER WEEK.       Allergies:  Allergies  Allergen Reactions  . Lipitor [Atorvastatin Calcium] Diarrhea  . Niacin And Related Hives  . Statins Diarrhea  . Macrobid [Nitrofurantoin Macrocrystal] Other (See Comments)    unknown    Past Medical History:  Diagnosis Date  . Anemia   . Diabetes mellitus type 2, insulin dependent ([place])   . Dyslipidemia   . GERD (gastroesophageal reflux disease)   . Hypertension   . Migraines   . Osteoporosis   . PVD (peripheral vascular disease) ([place])    abi .83 (L), .92 (R)  . RSD (reflex sympathetic dystrophy) 2007   R wrist/hand following fx  . Stroke ([place])   . TIA (transient ischemic attack)   . Varicose veins     Past Surgical History:  Procedure Laterality Date  . ABDOMINAL HYSTERECTOMY  1988  . APPENDECTOMY  1966  . BREAST CYST EXCISION    . CATARACT EXTRACTION  10/2009  . CHOLECYSTECTOMY  1989  . Several benign cyst removed     last 1 in 1972  . TUBAL LIGATION    . UMBILICAL HERNIA REPAIR      Family History  Problem Relation Age of Onset  . Stroke Mother 37  . Hypertension Mother   . Clotting disorder Father   . Heart attack Father   . Arrhythmia Sister   . Stroke Brother   . Diabetes Neg Hx   . Thyroid disease Neg Hx     Social History:  reports that she has never smoked. She has never used smokeless tobacco. She reports that she does not drink alcohol or use drugs.    Review of Systems      She is on Crestor 20 mg and 4 g Vascepa Triglycerides tend to be high,  followed by PCP       Lab Results  Component Value Date   CHOL 140 06/06/2018   HDL 35.60 (L) 06/06/2018   LDLCALC 52 10/21/2017   LDLDIRECT 45.0 06/06/2018   TRIG 303.0 (H) 06/06/2018   CHOLHDL 4 06/06/2018                   Thyroid:    On exam has had a 3 cm nodule on the right side but she has refused to consider biopsy This has not increased in size on subsequent exams  Lab Results  Component Value Date   TSH 1.820 02/13/2018       The blood pressure has been managed with losartan 100 mg, HCTZ and metoprolol, Followed by PCP Blood pressure is consistently controlled      BP Readings from Last 3 Encounters:  12/21/18 138/64  10/27/18 128/64  09/19/18 130/68        Diabetic neuropathy: She has a history of Numbness in her feet and toes    Last foot exam was in 12/2018 showing sensory loss in the distal feet  On Prolia from her PCP for osteoporosis   Physical Examination:  BP 138/64 (BP Location: Left Arm, Patient Position: Sitting, Cuff Size: Normal)   Pulse 77   Ht '5\' 4"'$  (1.626 m)   Wt 133 lb (60.3 kg)   SpO2 97%   BMI 22.83 kg/m   Right-sided thyroid nodule felt mostly on swallowing and is about 2.5-3 cm in size, smooth, slightly firm Left lobe not palpable  No lymphadenopathy  in the neck   Diabetic Foot Exam - Simple   Simple Foot Form Diabetic Foot exam was performed with the following findings:  Yes   Visual Inspection No deformities, no ulcerations, no other skin breakdown bilaterally:  Yes Sensation Testing See comments:  Yes Pulse Check See comments:  Yes Comments Absent monofilament sensation in the toes and plantar surfaces distally Absent pedal pulses    No coldness or cyanosis of the toes No pedal edema  ASSESSMENT /PLAN:  Diabetes type 2, insulin requiring with neuropathy     See history of present illness for detailed discussion of her current management and problems identified as well as blood sugar patterns  Her A1c is  still fairly good at 7.9 compared to her last visit  She is on combination of basal insulin and Trulicity and has relatively better postprandial control with increasing Trulicity to 1.5 mg Along with this she has had reduced need for basal insulin also  Currently no changes to be made in her insulin or Trulicity regimen She will try to get her freestyle libre data uploaded for further review and this will be done by her son He was instructed on how to upload the information from the home glucose reader  Addendum will be created when the data and analysis is available and recommendations will be made   NEUROPATHY with distal sensory loss: Discussed general foot care measures as prevention and she will continue to see her podiatrist for toenail trimming  THYROID nodule on the right: Likely benign as she has not had any change in her thyroid size on exam We will check thyroid levels today  Total visit time for evaluation and management of multiple problems and counseling =25 minutes   There are no Patient Instructions on file for this visit.   Elayne Snare 12/21/2018, 1:32 PM       Note: This office note was prepared with Dragon voice recognition system technology. Any transcriptional errors that result from this process are unintentional.  ADDENDUM: CGM information received from patient and analyzed as below   CONTINUOUS GLUCOSE MONITORING RECORD INTERPRETATION    Dates of Recording: 5/13 through 12/26/2018  Sensor description: [employer]  Results statistics:   CGM use % of time  66  Average and SD  166  Time in range     65   %  % Time Above 180  31   % Time above 250 3  % Time Below target  1    Glycemic patterns summary: Blood sugars show only mild variability overall with generally a trend to 1 higher readings over the target after about 2 PM through about 2 AM but average mostly ranging between 180 and 193 Lowest blood sugars are between 6-8 AM without any  hypoglycemia She has monitored blood sugars 2-5 times a day and data is available only for 66% of the time because of inadequate monitoring on some days  Hyperglycemic episodes these have occurred primarily mid afternoon after lunchtime and occasionally late evening but not sustained are consistent Overnight hyperglycemia was present mostly on 5/23 and 5/13  Hypoglycemic episodes have not occurred significantly with only a transient low reading early morning on 5/15 between 5-7 AM  Overnight periods: Blood sugars are usually starting of averaging about 185 at midnight gradually decreasing until about 6-7 AM with moderate variability Blood sugar was trending below 70 early morning on 5/15 only  Preprandial periods: Blood sugars are averaging near normal around 8 AM before  breakfast and are mostly around 150 average at lunchtime Evening blood sugars are generally averaging about 175 before meals  Postprandial periods: After breakfast: Blood sugars show a moderate rise with the average going up to 30 mg After lunch: Blood sugars are variable but highest blood sugars are mostly between 2-4 PM  After dinner: He has some variability after dinner: A significant increase since the baseline is high but blood sugar may rise more late at night after 10 PM  Recommendations: Based on current patterns and her treatment limitations no change in her insulin or Trulicity will be made  Elayne Snare 12/29/18

## 2018-12-21 NOTE — Progress Notes (Signed)
Please call to let patient know that the liver and kidney results are normal.  However blood sugar 210, need to try and reduce starchy foods and regular soft drinks

## 2018-12-25 ENCOUNTER — Other Ambulatory Visit: Payer: Self-pay | Admitting: Family Medicine

## 2018-12-27 ENCOUNTER — Other Ambulatory Visit: Payer: Self-pay | Admitting: Family Medicine

## 2018-12-27 MED ORDER — DONEPEZIL HCL 10 MG PO TABS: 10.0000 mg | ORAL_TABLET | Freq: Every day | ORAL | 3 refills | Status: DC

## 2018-12-27 NOTE — Telephone Encounter (Signed)
Pt's son called and states that we were going to increase her dose of Aricept to 10mg  if she tolerated the 5mg  and she is taking the 5mg  without any issues and would like to increase it. New rx sent to pharm.

## 2019-01-09 ENCOUNTER — Telehealth: Payer: Self-pay | Admitting: Family Medicine

## 2019-01-09 NOTE — Telephone Encounter (Signed)
Pt's son called and states that since starting the 10mg  of Aricept she has had very lose stools. This did not start until after we increased her Aricept. She had a few with the 5mg  but it has really gotten worse with the 10mg . Please advise.

## 2019-01-09 NOTE — Telephone Encounter (Signed)
Pt's son aware.

## 2019-01-09 NOTE — Telephone Encounter (Signed)
Decrease to 5 mg a day

## 2019-01-18 ENCOUNTER — Other Ambulatory Visit: Payer: Self-pay | Admitting: Family Medicine

## 2019-02-02 IMAGING — CT CT HEAD W/O CM
4 series · 16 of 47 positions shown, 18 images · non-contrast
Comparison: 09/04/2017

CLINICAL DATA: Slurred speech and dysarthria.

EXAM:
CT HEAD WITHOUT CONTRAST
TECHNIQUE: Contiguous axial images were obtained from the base of the skull
through the vertex without intravenous contrast.

[Series 3: head without · axial · non-contrast · 0.44mm/px · z∈[-123,-3]mm · 7 of 33 slices shown, 9 images]
[im 5/33  brain]
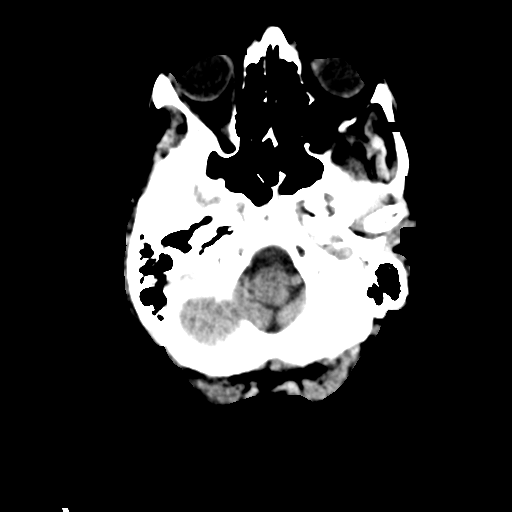
[im 5/33  bone]
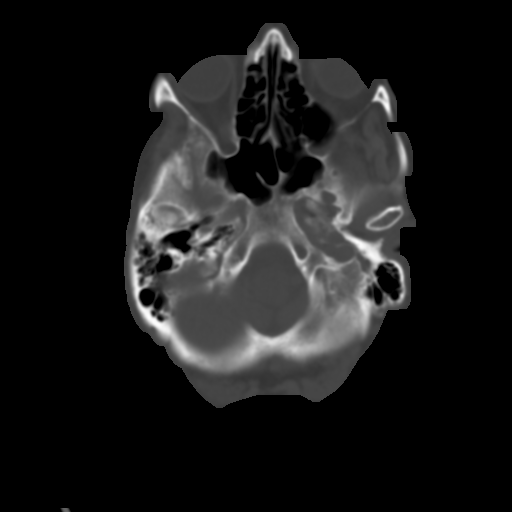
[im 9/33  brain]
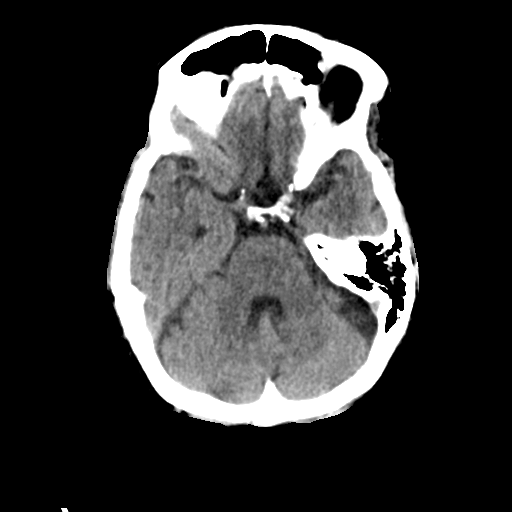
[im 13/33  brain]
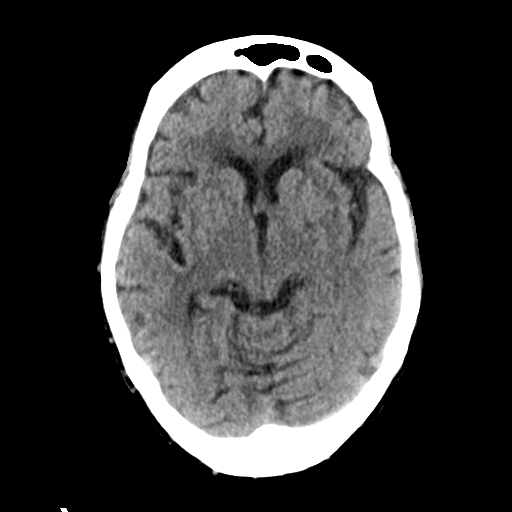
[im 17/33  brain]
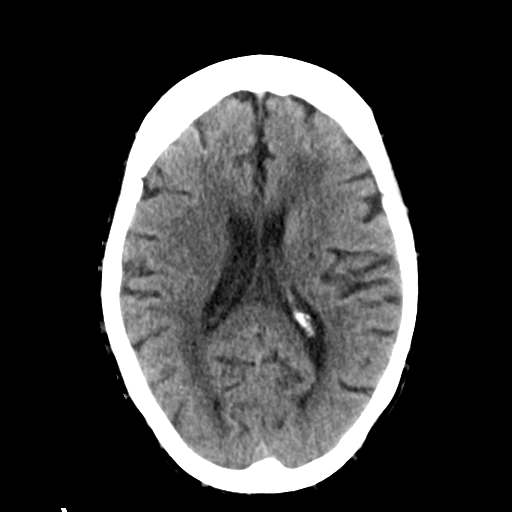
[im 21/33  brain]
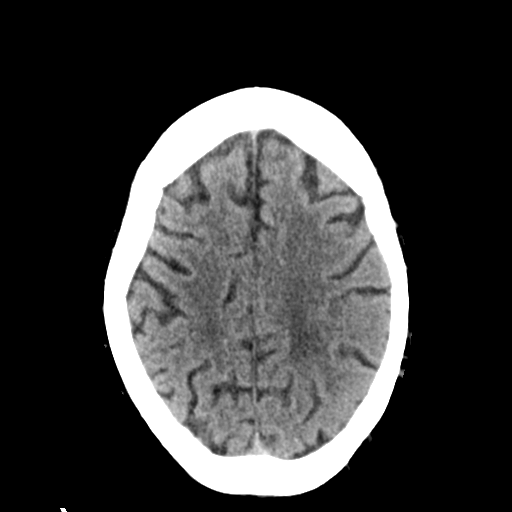
[im 21/33  bone]
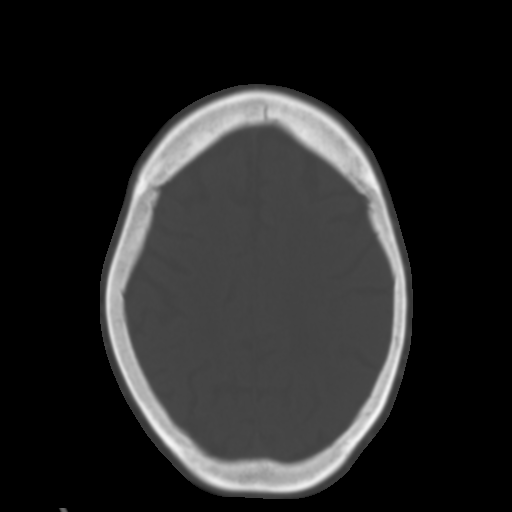
[im 25/33  brain]
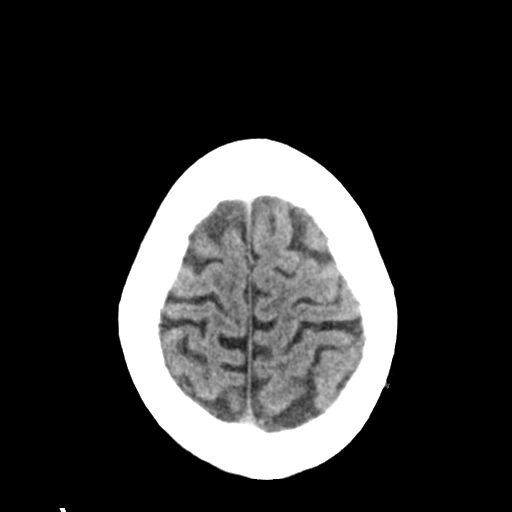
[im 29/33  brain]
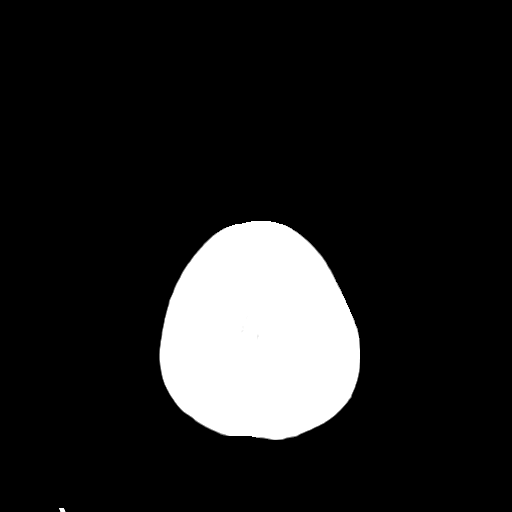

[Series 4: head bone · axial · 0.44mm/px · z∈[-127,-95]mm · 3 of 81 slices shown]
[im 9/81  bone]
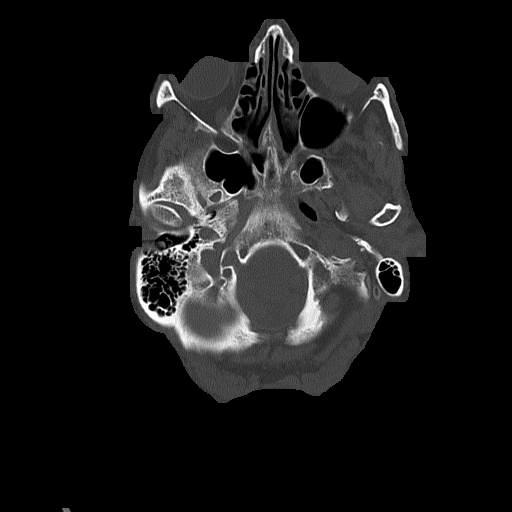
[im 17/81  bone]
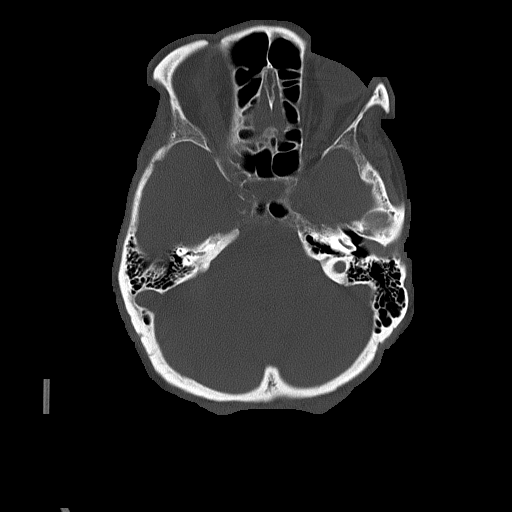
[im 25/81  bone]
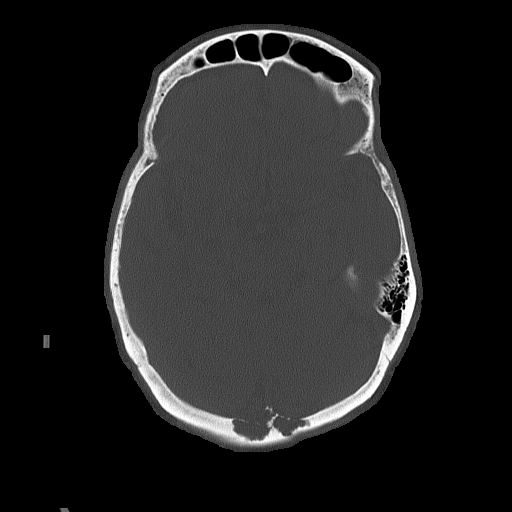

[Series 5: head without cor · coronal · non-contrast · 0.31mm/px · 3 of 67 slices shown]
[im 23/67  brain]
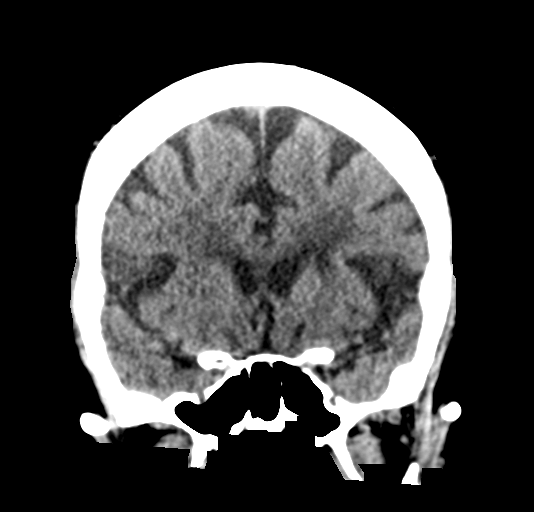
[im 30/67  brain]
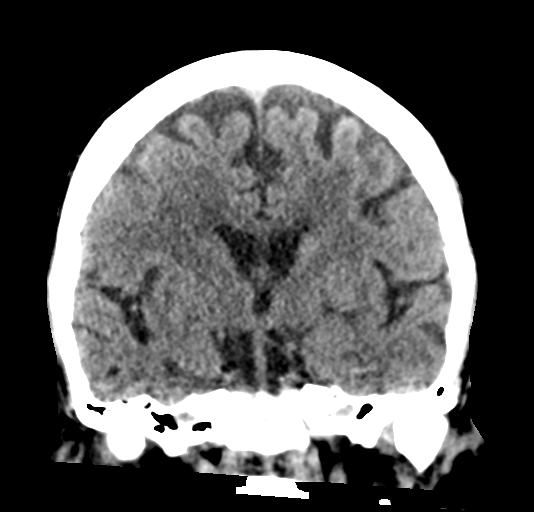
[im 37/67  brain]
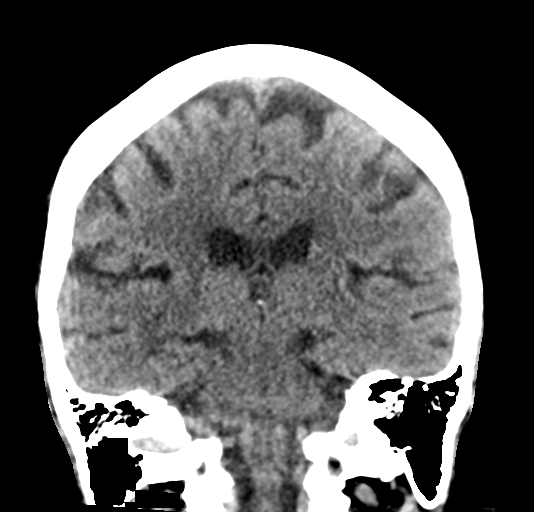

[Series 6: head without sag · sagittal · non-contrast · 0.31mm/px · 3 of 53 slices shown]
[im 18/53  brain]
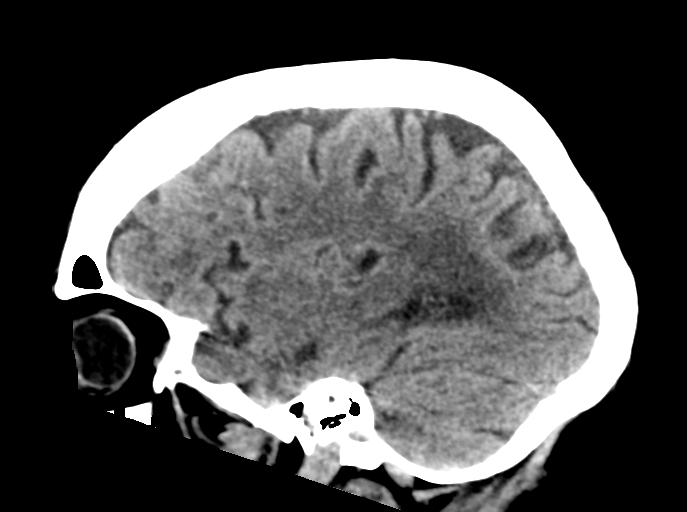
[im 27/53  brain]
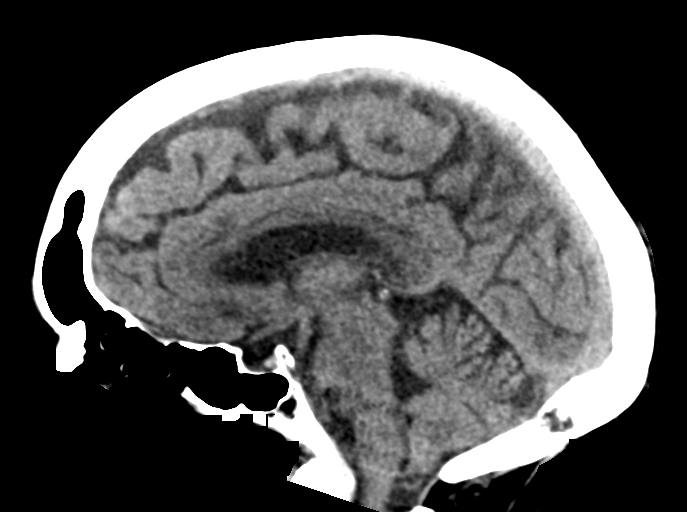
[im 35/53  brain]
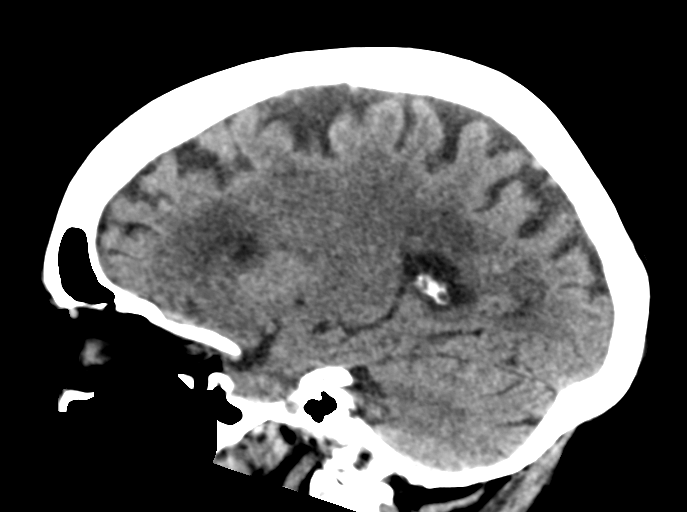

[16 of 47 positions shown; findings below may reference images not displayed]

FINDINGS: Brain: Stable cerebral and cerebellar atrophy with chronic moderate
periventricular subcortical white matter hypo attenuation consistent
with small vessel ischemia. Chronic subcortical infarct in the left
insula approaching the basal ganglia. No hydrocephalus. No
intra-axial mass nor extra-axial fluid collections. Bilateral basal
ganglial lacunar infarcts.

Vascular: No hyperdense vessels

Skull: No skull fracture or suspicious osseous lesions.

Sinuses/Orbits: Bilateral cataract extractions. Intact orbits and
globes. No acute sinus disease. Clear mastoids.

Other: None
IMPRESSION: Chronic stable atrophy with moderate small vessel ischemia. No
evidence of acute intracranial abnormality.

## 2019-02-09 ENCOUNTER — Ambulatory Visit: Payer: Medicare Other | Admitting: Podiatry

## 2019-02-09 ENCOUNTER — Other Ambulatory Visit: Payer: Self-pay

## 2019-02-09 ENCOUNTER — Encounter: Payer: Self-pay | Admitting: Podiatry

## 2019-02-09 DIAGNOSIS — M79675 Pain in left toe(s): Secondary | ICD-10-CM | POA: Diagnosis not present

## 2019-02-09 DIAGNOSIS — B351 Tinea unguium: Secondary | ICD-10-CM

## 2019-02-09 DIAGNOSIS — L84 Corns and callosities: Secondary | ICD-10-CM

## 2019-02-09 DIAGNOSIS — E1142 Type 2 diabetes mellitus with diabetic polyneuropathy: Secondary | ICD-10-CM | POA: Diagnosis not present

## 2019-02-09 DIAGNOSIS — M79674 Pain in right toe(s): Secondary | ICD-10-CM | POA: Diagnosis not present

## 2019-02-09 NOTE — Patient Instructions (Signed)
Diabetes Mellitus and Foot Care Foot care is an important part of your health, especially when you have diabetes. Diabetes may cause you to have problems because of poor blood flow (circulation) to your feet and legs, which can cause your skin to:  Become thinner and drier.  Break more easily.  Heal more slowly.  Peel and crack. You may also have nerve damage (neuropathy) in your legs and feet, causing decreased feeling in them. This means that you may not notice minor injuries to your feet that could lead to more serious problems. Noticing and addressing any potential problems early is the best way to prevent future foot problems. How to care for your feet Foot hygiene  Wash your feet daily with warm water and mild soap. Do not use hot water. Then, pat your feet and the areas between your toes until they are completely dry. Do not soak your feet as this can dry your skin.  Trim your toenails straight across. Do not dig under them or around the cuticle. File the edges of your nails with an emery board or nail file.  Apply a moisturizing lotion or petroleum jelly to the skin on your feet and to dry, brittle toenails. Use lotion that does not contain alcohol and is unscented. Do not apply lotion between your toes. Shoes and socks  Wear clean socks or stockings every day. Make sure they are not too tight. Do not wear knee-high stockings since they may decrease blood flow to your legs.  Wear shoes that fit properly and have enough cushioning. Always look in your shoes before you put them on to be sure there are no objects inside.  To break in new shoes, wear them for just a few hours a day. This prevents injuries on your feet. Wounds, scrapes, corns, and calluses  Check your feet daily for blisters, cuts, bruises, sores, and redness. If you cannot see the bottom of your feet, use a mirror or ask someone for help.  Do not cut corns or calluses or try to remove them with medicine.  If you  find a minor scrape, cut, or break in the skin on your feet, keep it and the skin around it clean and dry. You may clean these areas with mild soap and water. Do not clean the area with peroxide, alcohol, or iodine.  If you have a wound, scrape, corn, or callus on your foot, look at it several times a day to make sure it is healing and not infected. Check for: ? Redness, swelling, or pain. ? Fluid or blood. ? Warmth. ? Pus or a bad smell. General instructions  Do not cross your legs. This may decrease blood flow to your feet.  Do not use heating pads or hot water bottles on your feet. They may burn your skin. If you have lost feeling in your feet or legs, you may not know this is happening until it is too late.  Protect your feet from hot and cold by wearing shoes, such as at the beach or on hot pavement.  Schedule a complete foot exam at least once a year (annually) or more often if you have foot problems. If you have foot problems, report any cuts, sores, or bruises to your health care provider immediately. Contact a health care provider if:  You have a medical condition that increases your risk of infection and you have any cuts, sores, or bruises on your feet.  You have an injury that is not   healing.  You have redness on your legs or feet.  You feel burning or tingling in your legs or feet.  You have pain or cramps in your legs and feet.  Your legs or feet are numb.  Your feet always feel cold.  You have pain around a toenail. Get help right away if:  You have a wound, scrape, corn, or callus on your foot and: ? You have pain, swelling, or redness that gets worse. ? You have fluid or blood coming from the wound, scrape, corn, or callus. ? Your wound, scrape, corn, or callus feels warm to the touch. ? You have pus or a bad smell coming from the wound, scrape, corn, or callus. ? You have a fever. ? You have a red line going up your leg. Summary  Check your feet every day  for cuts, sores, red spots, swelling, and blisters.  Moisturize feet and legs daily.  Wear shoes that fit properly and have enough cushioning.  If you have foot problems, report any cuts, sores, or bruises to your health care provider immediately.  Schedule a complete foot exam at least once a year (annually) or more often if you have foot problems. This information is not intended to replace advice given to you by your health care provider. Make sure you discuss any questions you have with your health care provider. Document Released: 07/16/2000 Document Revised: 08/31/2017 Document Reviewed: 08/20/2016 Elsevier Patient Education  2020 [employer]   Onychomycosis/Fungal Toenails  WHAT IS IT? An infection that lies within the keratin of your nail plate that is caused by a fungus.  WHY ME? Fungal infections affect all ages, sexes, races, and creeds.  There may be many factors that predispose you to a fungal infection such as age, coexisting medical conditions such as diabetes, or an autoimmune disease; stress, medications, fatigue, genetics, etc.  Bottom line: fungus thrives in a warm, moist environment and your shoes offer such a location.  IS IT CONTAGIOUS? Theoretically, yes.  You do not want to share shoes, nail clippers or files with someone who has fungal toenails.  Walking around barefoot in the same room or sleeping in the same bed is unlikely to transfer the organism.  It is important to realize, however, that fungus can spread easily from one nail to the next on the same foot.  HOW DO WE TREAT THIS?  There are several ways to treat this condition.  Treatment may depend on many factors such as age, medications, pregnancy, liver and kidney conditions, etc.  It is best to ask your doctor which options are available to you.  1. No treatment.   Unlike many other medical concerns, you can live with this condition.  However for many people this can be a painful condition and may lead to  ingrown toenails or a bacterial infection.  It is recommended that you keep the nails cut short to help reduce the amount of fungal nail. 2. Topical treatment.  These range from herbal remedies to prescription strength nail lacquers.  About 40-50% effective, topicals require twice daily application for approximately 9 to 12 months or until an entirely new nail has grown out.  The most effective topicals are medical grade medications available through physicians offices. 3. Oral antifungal medications.  With an 80-90% cure rate, the most common oral medication requires 3 to 4 months of therapy and stays in your system for a year as the new nail grows out.  Oral antifungal medications do require   blood work to make sure it is a safe drug for you.  A liver function panel will be performed prior to starting the medication and after the first month of treatment.  It is important to have the blood work performed to avoid any harmful side effects.  In general, this medication safe but blood work is required. 4. Laser Therapy.  This treatment is performed by applying a specialized laser to the affected nail plate.  This therapy is noninvasive, fast, and non-painful.  It is not covered by insurance and is therefore, out of pocket.  The results have been very good with a 80-95% cure rate.  [facility] is the only practice in the area to offer this therapy. 5. Permanent Nail Avulsion.  Removing the entire nail so that a new nail will not grow back. 

## 2019-02-11 NOTE — Progress Notes (Signed)
Subjective: Rebecca Perkins presents today for preventative foot care. Patient has h/o neuropathy. Her son is present during the visit. She is see for chronic, painful mycotic toenails corn right 2nd digit which interfere with daily activities and routine tasks.  Pain is aggravated when wearing enclosed shoe gear and relieved with periodic professional debridement.   Rebecca Frizzle, MD is her PCP and last visit was 12/21/2018.   Current Outpatient Medications:  .  Blood Glucose Monitoring Suppl (ONE TOUCH ULTRA SYSTEM KIT) W/DEVICE KIT, 1 kit by Does not apply route once. Tests Blood sugar before meals and at bedtime, Disp: 1 each, Rfl: 0 .  Calcium Carbonate-Vitamin D (CALTRATE 600+D PO), Take by mouth 2 (two) times daily., Disp: , Rfl:  .  clopidogrel (PLAVIX) 75 MG tablet, Take 1 tablet (75 mg total) by mouth daily., Disp: 30 tablet, Rfl: 0 .  Coenzyme Q10 (COQ10 PO), Take by mouth., Disp: , Rfl:  .  Continuous Blood Gluc Sensor (FREESTYLE LIBRE 14 DAY SENSOR) MISC, USE EVERY 14 DAYS, Disp: 2 each, Rfl: 5 .  denosumab (PROLIA) 60 MG/ML SOSY injection, Inject into the skin every 6 (six) months. , Disp: , Rfl:  .  donepezil (ARICEPT) 10 MG tablet, Take 1 tablet (10 mg total) by mouth at bedtime., Disp: 90 tablet, Rfl: 3 .  hydrochlorothiazide (MICROZIDE) 12.5 MG capsule, TAKE 1 CAPSULE(12.5 MG) BY MOUTH DAILY, Disp: 30 capsule, Rfl: 5 .  Insulin Glargine (TOUJEO MAX SOLOSTAR) 300 UNIT/ML SOPN, Inject 34 Units into the skin every morning. , Disp: , Rfl:  .  insulin lispro (HUMALOG KWIKPEN) 100 UNIT/ML KiwkPen, Use only if BS over 150 in am, Disp: 15 mL, Rfl: 1 .  Insulin Pen Needle (B-D ULTRAFINE III SHORT PEN) 31G X 8 MM MISC, INJECT AS DIRECTED 4 TIMES DAILY, Disp: 300 each, Rfl: 0 .  INSULIN SYRINGE 1CC/29G 29G X 1/2" 1 ML MISC, AS DIRECTED., Disp: 100 each, Rfl: 3 .  losartan (COZAAR) 100 MG tablet, TAKE 1 TABLET BY MOUTH EVERY DAY, Disp: 90 tablet, Rfl: 3 .  metoprolol succinate  (TOPROL-XL) 50 MG 24 hr tablet, Take 1 tablet (50 mg total) by mouth daily. Take with or immediately following a meal., Disp: 90 tablet, Rfl: 3 .  metoprolol tartrate (LOPRESSOR) 50 MG tablet, TAKE 1 TABLET BY MOUTH TWICE DAILY, Disp: 180 tablet, Rfl: 3 .  Multiple Vitamin (MULTIVITAMIN) tablet, Take 1 tablet by mouth daily.  , Disp: , Rfl:  .  NONFORMULARY OR COMPOUNDED ITEM, [facility]:  Peripheral Neuropathy Cream - Bupivacaine 1%, Doxepin 3%, Gabapentin 6%, Pentoxifylline 3%, Topiramate 1%, apply 1-2 grams to affected ares 3-4 times a day prn., Disp: 100 each, Rfl: 5 .  omeprazole (PRILOSEC) 20 MG capsule, TAKE 1 CAPSULE BY MOUTH DAILY, Disp: 90 capsule, Rfl: 3 .  ONE TOUCH ULTRA TEST test strip, USE FOUR TIMES DAILY BEFORE MEALS AND EVERY NIGHT AT BEDTIME, Disp: 150 each, Rfl: 5 .  Pyridoxine HCl (VITAMIN B-6) 100 MG tablet, Take 100 mg by mouth daily.  , Disp: , Rfl:  .  rosuvastatin (CRESTOR) 20 MG tablet, TAKE 1 TABLET(20 MG) BY MOUTH DAILY, Disp: 90 tablet, Rfl: 3 .  TOUJEO SOLOSTAR 300 UNIT/ML SOPN, INJECT 46 UNITS UNDER THE SKIN DAILY, Disp: 21 mL, Rfl: 2 .  TRULICITY 1.5 QI/3.4VQ SOPN, INJECT 1.5MG(0.5 ML) UNDER THE SKIN ONCE PER WEEK., Disp: 2 mL, Rfl: 3 .  VASCEPA 1 g CAPS, TAKE 2 CAPSULES BY MOUTH TWICE DAILY, Disp: 360 capsule,  Rfl: 3  Allergies  Allergen Reactions  . Lipitor [Atorvastatin Calcium] Diarrhea  . Niacin And Related Hives  . Statins Diarrhea  . Macrobid [Nitrofurantoin Macrocrystal] Other (See Comments)    unknown    Objective: Vascular Examination: Capillary refill time <3 seconds x 10 digits.  Dorsalis pedis pulses present b/l.  Posterior tibial pulses present b/l.  No digital hair x 10 digits.  Skin temperature gradient WNL b/l.  Dermatological Examination: Skin with normal turgor, texture and tone b/l.  Toenails 1-5 b/l discolored, thick, dystrophic with subungual debris and pain with palpation to nailbeds due to thickness of  nails.  Hyperkeratotic lesion noted distal tip right 2nd toe. No erythema, no edema, no drainage, no flocculence noted.   Musculoskeletal: Muscle strength 5/5 to all LE muscle groups.  Hammertoe right 2nd digit.  Neurological: Sensation absent with 10 gram monofilament.  Vibratory sensation absent b/l.  Assessment: 1. Painful onychomycosis toenails 1-5 b/l 2. Corn right 2nd digit 3. Peripheral neuropathy 4.  Plan: 1. Toenails 1-5 b/l were debrided in length and girth without iatrogenic bleeding. 2. Corn(s) pared right 2nd digit utilizing sterile scalpel blade without incident. Dispensed new silicone toen cap for daily protection. 3. Continue Rentz compounded neuropathy cream prn for neuropathic pain. 4. Patient to continue soft, supportive shoe gear daily. 5. Patient to report any pedal injuries to medical professional immediately. 6. Follow up 3 months.  7. Patient/POA to call should there be a concern in the interim.

## 2019-02-12 NOTE — Progress Notes (Signed)
[facility] Note  02/13/2019     CHIEF COMPLAINT Patient presents for Diabetic Eye Exam   HISTORY OF PRESENT ILLNESS: Rebecca Perkins is a 85 y.o. female who presents to the clinic today for:   HPI    Diabetic Eye Exam    Vision is stable.  Associated Symptoms Negative for Flashes, Pain, Trauma, Fever, Floaters, Redness, Scalp Tenderness, Weight Loss, Distortion, Photophobia, Jaw Claudication, Fatigue, Blind Spot, Glare and Shoulder/Hip pain.  Diabetes characteristics include Type 2 and on insulin.  This started years ago (Adult onset).  Blood sugar level is controlled.  Last Blood Glucose 78 (Yesterday am).  Last A1C 7.9 (May 2020).  Associated Diagnosis Neuropathy.  I, the attending physician,  performed the HPI with the patient and updated documentation appropriately.          Comments    Patient referred by Dr. Ellie Lunch for diabetic retinal evaluation. Patient has had recent blood sugar spikes (over 200), but getting better since adjusting meds (added trulicity 6-7 months ago). Patient states vision seems good OU, uses glasses for reading mostly. Has had several TIA's, last episode was in early 2019. TIA's have not affected vision as far as patient is aware. Uses AT's qam OU for dry eyes.       Last edited by Bernarda Caffey, MD on 02/13/2019  4:55 PM. (History)    pt is with her son today, pt states she is a regular pt of Dr. Ellie Lunch, she states she saw Dr. Ellie Lunch 6 months ago and she saw a "spot" in the back of her eye, she states this past time she saw her the spot was still there and had not changed at all, pt states she is diabetic and states it is "fairly" controlled, she is on trujeo and trulicity, but has occasional spikes of blood sugar around 280, pt states she only wears her glasses when she is watching TV or reading  Referring physician: Luberta Mutter, MD [place],  [place] [id number]  HISTORICAL INFORMATION:   Selected notes  from the MEDICAL RECORD NUMBER Referred by Dr. Luberta Mutter for concern of LEE: (C. McCuen) [BCVA: OD: 20/20- OS: 20/20-] Ocular Hx-pseudo OU PMH-DM (last W4Y: 7.9, takes trulicity, Humalog, toujeo) , anemia, mild dementia, TIA's   CURRENT MEDICATIONS: Current Outpatient Medications (Ophthalmic Drugs)  Medication Sig  . polyvinyl alcohol (LIQUIFILM TEARS) 1.4 % ophthalmic solution 1 drop as needed for dry eyes.   No current facility-administered medications for this visit.  (Ophthalmic Drugs)   Current Outpatient Medications (Other)  Medication Sig  . Blood Glucose Monitoring Suppl (ONE TOUCH ULTRA SYSTEM KIT) W/DEVICE KIT 1 kit by Does not apply route once. Tests Blood sugar before meals and at bedtime  . Calcium Carbonate-Vitamin D (CALTRATE 600+D PO) Take by mouth 2 (two) times daily.  . clopidogrel (PLAVIX) 75 MG tablet Take 1 tablet (75 mg total) by mouth daily.  . Coenzyme Q10 (COQ10 PO) Take by mouth.  . Continuous Blood Gluc Sensor (FREESTYLE LIBRE 14 DAY SENSOR) MISC USE EVERY 14 DAYS  . denosumab (PROLIA) 60 MG/ML SOSY injection Inject into the skin every 6 (six) months.   . donepezil (ARICEPT) 10 MG tablet Take 1 tablet (10 mg total) by mouth at bedtime.  . hydrochlorothiazide (MICROZIDE) 12.5 MG capsule TAKE 1 CAPSULE(12.5 MG) BY MOUTH DAILY  . Insulin Glargine (TOUJEO MAX SOLOSTAR) 300 UNIT/ML SOPN Inject 34 Units into the skin every morning.   Marland Kitchen  insulin lispro (HUMALOG KWIKPEN) 100 UNIT/ML KiwkPen Use only if BS over 150 in am  . Insulin Pen Needle (B-D ULTRAFINE III SHORT PEN) 31G X 8 MM MISC INJECT AS DIRECTED 4 TIMES DAILY  . INSULIN SYRINGE 1CC/29G 29G X 1/2" 1 ML MISC AS DIRECTED.  Marland Kitchen losartan (COZAAR) 100 MG tablet TAKE 1 TABLET BY MOUTH EVERY DAY  . metoprolol succinate (TOPROL-XL) 50 MG 24 hr tablet Take 1 tablet (50 mg total) by mouth daily. Take with or immediately following a meal.  . metoprolol tartrate (LOPRESSOR) 50 MG tablet TAKE 1 TABLET BY MOUTH TWICE DAILY   . Multiple Vitamin (MULTIVITAMIN) tablet Take 1 tablet by mouth daily.    . NONFORMULARY OR COMPOUNDED ITEM [facility]:  Peripheral Neuropathy Cream - Bupivacaine 1%, Doxepin 3%, Gabapentin 6%, Pentoxifylline 3%, Topiramate 1%, apply 1-2 grams to affected ares 3-4 times a day prn.  . omeprazole (PRILOSEC) 20 MG capsule TAKE 1 CAPSULE BY MOUTH DAILY  . ONE TOUCH ULTRA TEST test strip USE FOUR TIMES DAILY BEFORE MEALS AND EVERY NIGHT AT BEDTIME  . Pyridoxine HCl (VITAMIN B-6) 100 MG tablet Take 100 mg by mouth daily.    . rosuvastatin (CRESTOR) 20 MG tablet TAKE 1 TABLET(20 MG) BY MOUTH DAILY  . TOUJEO SOLOSTAR 300 UNIT/ML SOPN INJECT 46 UNITS UNDER THE SKIN DAILY  . TRULICITY 1.5 WL/8.9HT SOPN INJECT 1.'5MG'$ (0.5 ML) UNDER THE SKIN ONCE PER WEEK.  Marland Kitchen VASCEPA 1 g CAPS TAKE 2 CAPSULES BY MOUTH TWICE DAILY   No current facility-administered medications for this visit.  (Other)      REVIEW OF SYSTEMS: ROS    Positive for: Gastrointestinal, Neurological, Genitourinary, Endocrine, Eyes   Negative for: Constitutional, Skin, Musculoskeletal, HENT, Cardiovascular, Respiratory, Psychiatric, Allergic/Imm, Heme/Lymph   Last edited by Roselee Nova D on 02/13/2019  1:15 PM. (History)       ALLERGIES Allergies  Allergen Reactions  . Lipitor [Atorvastatin Calcium] Diarrhea  . Niacin And Related Hives  . Statins Diarrhea  . Macrobid [Nitrofurantoin Macrocrystal] Other (See Comments)    unknown    PAST MEDICAL HISTORY Past Medical History:  Diagnosis Date  . Anemia   . Diabetes mellitus type 2, insulin dependent ([place])   . Dyslipidemia   . GERD (gastroesophageal reflux disease)   . Hypertension   . Migraines   . Osteoporosis   . PVD (peripheral vascular disease) ([place])    abi .83 (L), .92 (R)  . RSD (reflex sympathetic dystrophy) 2007   R wrist/hand following fx  . Stroke ([place])   . TIA (transient ischemic attack)   . Varicose veins    Past Surgical History:  Procedure  Laterality Date  . ABDOMINAL HYSTERECTOMY  1988  . APPENDECTOMY  1966  . BREAST CYST EXCISION    . CATARACT EXTRACTION Bilateral 10/2009  . CHOLECYSTECTOMY  1989  . Several benign cyst removed     last 1 in 1972  . TUBAL LIGATION    . UMBILICAL HERNIA REPAIR      FAMILY HISTORY Family History  Problem Relation Age of Onset  . Stroke Mother 53  . Hypertension Mother   . Clotting disorder Father   . Heart attack Father   . Arrhythmia Sister   . Stroke Brother   . Thyroid disease Neg Hx     SOCIAL HISTORY Social History   Tobacco Use  . Smoking status: Never Smoker  . Smokeless tobacco: Never Used  Substance Use Topics  . Alcohol use: No  .  Drug use: No         OPHTHALMIC EXAM:  Base Eye Exam    Visual Acuity (Snellen - Linear)      Right Left   Dist cc 20/25 -2 20/20 -1   Dist ph cc NI NI   Correction: Glasses       Tonometry (Tonopen, 1:23 PM)      Right Left   Pressure 14 15       Pupils      Dark Light Shape React APD   Right 3 2 Round Brisk None   Left 3 2 Round Brisk None       Visual Fields (Counting fingers)      Left Right    Full Full       Extraocular Movement      Right Left    Full, Ortho Full, Ortho       Neuro/Psych    Oriented x3: Yes   Mood/Affect: Normal       Dilation    Both eyes: 1.0% Mydriacyl, 2.5% Phenylephrine @ 1:26 PM        Slit Lamp and Fundus Exam    Slit Lamp Exam      Right Left   Lids/Lashes Meibomian gland dysfunction Meibomian gland dysfunction   Conjunctiva/Sclera mild Conjunctivochalasis mild Conjunctivochalasis   Cornea Debris in tear film, 1+ Punctate epithelial erosions Debris in tear film, 1+ Punctate epithelial erosions   Anterior Chamber Deep and quiet Deep and quiet   Iris Round and dilated, No NVI Round and dilated, No NVI   Lens Posterior chamber intraocular lens Posterior chamber intraocular lens   Vitreous mild Vitreous syneresis mild Vitreous syneresis       Fundus Exam      Right  Left   Disc trace Pallor, Sharp rim trace Pallor, Sharp rim   C/D Ratio 0.3 0.5   Macula Flat, Blunted foveal reflex, scattered Microaneurysms, focal cluster of MA with mild edema superior macula Flat, Blunted foveal reflex, scattered MA, no edema   Vessels Vascular attenuation, Tortuous, no NVE Vascular attenuation, Tortuous, no NVE   Periphery Attached, mild scattered MA    Attached, scattered MA           Refraction    Wearing Rx      Sphere Cylinder Axis Add   Right -1.50 +1.25 160 +2.75   Left -1.25 +1.00 160 +2.75   Type: Bifocal       Manifest Refraction      Sphere Cylinder Axis Dist VA   Right -1.50 +1.00 140 20/25-1   Left -1.00 +1.00 160 20/20-2          IMAGING AND PROCEDURES  Imaging and Procedures for '@TODAY'$ @  OCT, Retina - OU - Both Eyes       Right Eye Quality was good. Central Foveal Thickness: 274. Progression has no prior data. Findings include normal foveal contour, no SRF, intraretinal hyper-reflective material, intraretinal fluid (Mild, focal region on DME superior to fovea).   Left Eye Quality was good. Central Foveal Thickness: 268. Progression has no prior data. Findings include normal foveal contour, no IRF, no SRF.   Notes *Images captured and stored on drive  Diagnosis / Impression:  OD: Mild, focal region of DME superior to fovea OS: no DME  Clinical management:  See below  Abbreviations: NFP - Normal foveal profile. CME - cystoid macular edema. PED - pigment epithelial detachment. IRF - intraretinal fluid. SRF - subretinal fluid. EZ - ellipsoid  zone. ERM - epiretinal membrane. ORA - outer retinal atrophy. ORT - outer retinal tubulation. SRHM - subretinal hyper-reflective material        Fluorescein Angiography Optos (Transit OD)       Right Eye   Progression has no prior data. Early phase findings include microaneurysm. Mid/Late phase findings include microaneurysm, leakage.   Left Eye   Progression has no prior data.  Early phase findings include microaneurysm. Mid/Late phase findings include leakage, microaneurysm.   Notes Images stored on drive;   Impression: Moderate NPDR OU OD: Focal hyperfluorescent leakage superior macula OD corresponding to area of focal DME on OCT                 ASSESSMENT/PLAN:    ICD-10-CM   1. Moderate nonproliferative diabetic retinopathy of right eye with macular edema associated with type 2 diabetes mellitus ([place])  X90.2409   2. Moderate nonproliferative diabetic retinopathy of left eye without macular edema associated with type 2 diabetes mellitus ([place])  B35.3299   3. Retinal edema  H35.81 OCT, Retina - OU - Both Eyes  4. Essential hypertension  I10   5. Hypertensive retinopathy of both eyes  H35.033 Fluorescein Angiography Optos (Transit OD)  6. Pseudophakia of both eyes  Z96.1     1-3. Moderate nonproliferative diabetic retinopathy w/ DME, OD  - OS without DME  - The incidence, risk factors for progression, natural history and treatment options for diabetic retinopathy were discussed with patient.    - The need for close monitoring of blood glucose, blood pressure, and serum lipids, avoiding cigarette or any type of tobacco, and the need for long term follow up was also discussed with patient.  - BCVA 20/25 OD, 20/20 OS  - exam shows scattered MA/IRH OU; OD with focal, non-central edema in superior macula  - OCT confirms focal DME OD as described above, OS no DME  - FA (07.14.20) shows late leaking MA OU; OD with focal hyperfluorescent leakage in area of DME  - discussed findings, prognosis and treatment options including observation, focal laser, and intravitreal anti-VEGF therapy  - pt wishes to observe for now - reasonable  - f/u 3 months - repeat DFE, OCT  4,5. Hypertensive retinopathy OU  - discussed importance of tight BP control  - monitor  6. Pseudophakia OU  - s/p CE/IOL OU (Dr. Albina Billet)  - beautiful surgeries, doing well  -  monitor   Ophthalmic Meds Ordered this visit:  No orders of the defined types were placed in this encounter.      Return in about 3 months (around 05/16/2019) for f/u NPDR OU, DFE, OCT.  There are no Patient Instructions on file for this visit.   Explained the diagnoses, plan, and follow up with the patient and they expressed understanding.  Patient expressed understanding of the importance of proper follow up care.   This document serves as a record of services personally performed by Gardiner Sleeper, MD, PhD. It was created on their behalf by Ernest Mallick, OA, an ophthalmic assistant. The creation of this record is the provider's dictation and/or activities during the visit.    Electronically signed by: Ernest Mallick, OA  07.13.2020 4:59 PM    Gardiner Sleeper, M.D., Ph.D. Diseases & Surgery of the Retina and Vitreous [facility]   I have reviewed the above documentation for accuracy and completeness, and I agree with the above. Gardiner Sleeper, M.D., Ph.D. 02/13/19 5:18 PM  Abbreviations: M myopia (nearsighted); A astigmatism; H hyperopia (farsighted); P presbyopia; Mrx spectacle prescription;  CTL contact lenses; OD right eye; OS left eye; OU both eyes  XT exotropia; ET esotropia; PEK punctate epithelial keratitis; PEE punctate epithelial erosions; DES dry eye syndrome; MGD meibomian gland dysfunction; ATs artificial tears; PFAT's preservative free artificial tears; [place] nuclear sclerotic cataract; PSC posterior subcapsular cataract; ERM epi-retinal membrane; PVD posterior vitreous detachment; RD retinal detachment; DM diabetes mellitus; DR diabetic retinopathy; NPDR non-proliferative diabetic retinopathy; PDR proliferative diabetic retinopathy; CSME clinically significant macular edema; DME diabetic macular edema; dbh dot blot hemorrhages; CWS cotton wool spot; POAG primary open angle glaucoma; C/D cup-to-disc ratio; HVF humphrey visual field; GVF goldmann  visual field; OCT optical coherence tomography; IOP intraocular pressure; BRVO Branch retinal vein occlusion; CRVO central retinal vein occlusion; CRAO central retinal artery occlusion; BRAO branch retinal artery occlusion; RT retinal tear; SB scleral buckle; PPV pars plana vitrectomy; VH Vitreous hemorrhage; PRP panretinal laser photocoagulation; IVK intravitreal kenalog; VMT vitreomacular traction; MH Macular hole;  NVD neovascularization of the disc; NVE neovascularization elsewhere; AREDS age related eye disease study; ARMD age related macular degeneration; POAG primary open angle glaucoma; EBMD epithelial/anterior basement membrane dystrophy; ACIOL anterior chamber intraocular lens; IOL intraocular lens; PCIOL posterior chamber intraocular lens; Phaco/IOL phacoemulsification with intraocular lens placement; [place] photorefractive keratectomy; LASIK laser assisted in situ keratomileusis; HTN hypertension; DM diabetes mellitus; COPD chronic obstructive pulmonary disease

## 2019-02-13 ENCOUNTER — Encounter (INDEPENDENT_AMBULATORY_CARE_PROVIDER_SITE_OTHER): Payer: Self-pay | Admitting: Ophthalmology

## 2019-02-13 ENCOUNTER — Ambulatory Visit (INDEPENDENT_AMBULATORY_CARE_PROVIDER_SITE_OTHER): Payer: Medicare Other | Admitting: Ophthalmology

## 2019-02-13 ENCOUNTER — Other Ambulatory Visit: Payer: Self-pay

## 2019-02-13 DIAGNOSIS — I10 Essential (primary) hypertension: Secondary | ICD-10-CM

## 2019-02-13 DIAGNOSIS — E113392 Type 2 diabetes mellitus with moderate nonproliferative diabetic retinopathy without macular edema, left eye: Secondary | ICD-10-CM | POA: Diagnosis not present

## 2019-02-13 DIAGNOSIS — H3581 Retinal edema: Secondary | ICD-10-CM | POA: Diagnosis not present

## 2019-02-13 DIAGNOSIS — H35033 Hypertensive retinopathy, bilateral: Secondary | ICD-10-CM

## 2019-02-13 DIAGNOSIS — E113311 Type 2 diabetes mellitus with moderate nonproliferative diabetic retinopathy with macular edema, right eye: Secondary | ICD-10-CM

## 2019-02-13 DIAGNOSIS — Z961 Presence of intraocular lens: Secondary | ICD-10-CM

## 2019-03-17 ENCOUNTER — Other Ambulatory Visit: Payer: Self-pay | Admitting: Endocrinology

## 2019-04-12 ENCOUNTER — Other Ambulatory Visit: Payer: Self-pay | Admitting: Family Medicine

## 2019-04-25 ENCOUNTER — Other Ambulatory Visit: Payer: Self-pay | Admitting: Endocrinology

## 2019-04-25 ENCOUNTER — Ambulatory Visit: Payer: Medicare Other | Admitting: Endocrinology

## 2019-04-25 DIAGNOSIS — Z0289 Encounter for other administrative examinations: Secondary | ICD-10-CM

## 2019-04-30 ENCOUNTER — Other Ambulatory Visit: Payer: Self-pay

## 2019-04-30 ENCOUNTER — Ambulatory Visit (INDEPENDENT_AMBULATORY_CARE_PROVIDER_SITE_OTHER): Payer: Medicare Other | Admitting: Family Medicine

## 2019-04-30 DIAGNOSIS — M810 Age-related osteoporosis without current pathological fracture: Secondary | ICD-10-CM | POA: Diagnosis not present

## 2019-04-30 DIAGNOSIS — Z23 Encounter for immunization: Secondary | ICD-10-CM | POA: Diagnosis not present

## 2019-04-30 MED ORDER — DENOSUMAB 60 MG/ML ~~LOC~~ SOSY
60.0000 mg | PREFILLED_SYRINGE | Freq: Once | SUBCUTANEOUS | Status: AC
  Administered 2019-04-30: 60 mg via SUBCUTANEOUS

## 2019-05-08 ENCOUNTER — Other Ambulatory Visit: Payer: Self-pay | Admitting: Endocrinology

## 2019-05-12 ENCOUNTER — Other Ambulatory Visit: Payer: Self-pay | Admitting: Family Medicine

## 2019-05-14 ENCOUNTER — Other Ambulatory Visit: Payer: Self-pay

## 2019-05-14 MED ORDER — DONEPEZIL HCL 5 MG PO TABS: 5.0000 mg | ORAL_TABLET | Freq: Every day | ORAL | 5 refills | Status: DC

## 2019-05-14 NOTE — Progress Notes (Signed)
[facility] Note  05/16/2019     CHIEF COMPLAINT Patient presents for Retina Follow Up   HISTORY OF PRESENT ILLNESS: Rebecca Perkins is a 85 y.o. female who presents to the clinic today for:   HPI    Retina Follow Up    Patient presents with  Diabetic Retinopathy.  In right eye.  Severity is moderate.  Duration of 3 months.  Since onset it is stable.  I, the attending physician,  performed the HPI with the patient and updated documentation appropriately.          Comments    Patient states vision the same OU. BS was 120 this am. Last a1c was 7.9 on 05.21.20.        Last edited by Bernarda Caffey, MD on 05/16/2019  1:32 PM. (History)    pt is with her son today, pt has seen Dr. Ellie Lunch since her last visit here and did not need any updates to her glasses Rx, pt uses AT's for dry eyes  Referring physician: Luberta Mutter, MD [place],  [place] [id number]  HISTORICAL INFORMATION:   Selected notes from the MEDICAL RECORD NUMBER Referred by Dr. Luberta Mutter for concern of DME LEE: (C. McCuen) [BCVA: OD: 20/20- OS: 20/20-] Ocular Hx-pseudo OU PMH-DM (last Z5G: 7.9, takes trulicity, Humalog, toujeo) , anemia, mild dementia, TIA's   CURRENT MEDICATIONS: Current Outpatient Medications (Ophthalmic Drugs)  Medication Sig  . polyvinyl alcohol (LIQUIFILM TEARS) 1.4 % ophthalmic solution 1 drop as needed for dry eyes.   No current facility-administered medications for this visit.  (Ophthalmic Drugs)   Current Outpatient Medications (Other)  Medication Sig  . Blood Glucose Monitoring Suppl (ONE TOUCH ULTRA SYSTEM KIT) W/DEVICE KIT 1 kit by Does not apply route once. Tests Blood sugar before meals and at bedtime  . Calcium Carbonate-Vitamin D (CALTRATE 600+D PO) Take by mouth 2 (two) times daily.  . clopidogrel (PLAVIX) 75 MG tablet TAKE 1 TABLET BY MOUTH DAILY  . Coenzyme Q10 (COQ10 PO) Take by mouth.  . Continuous Blood Gluc Sensor  (FREESTYLE LIBRE 14 DAY SENSOR) MISC USE EVERY 14 DAYS  . denosumab (PROLIA) 60 MG/ML SOSY injection Inject into the skin every 6 (six) months.   . donepezil (ARICEPT) 10 MG tablet Take 1 tablet (10 mg total) by mouth at bedtime.  . donepezil (ARICEPT) 5 MG tablet Take 1 tablet (5 mg total) by mouth at bedtime.  . hydrochlorothiazide (MICROZIDE) 12.5 MG capsule TAKE 1 CAPSULE(12.5 MG) BY MOUTH DAILY  . Insulin Glargine (TOUJEO MAX SOLOSTAR) 300 UNIT/ML SOPN Inject 34 Units into the skin every morning.   . insulin lispro (HUMALOG KWIKPEN) 100 UNIT/ML KiwkPen Use only if BS over 150 in am  . Insulin Pen Needle (B-D ULTRAFINE III SHORT PEN) 31G X 8 MM MISC INJECT AS DIRECTED 4 TIMES DAILY  . INSULIN SYRINGE 1CC/29G 29G X 1/2" 1 ML MISC AS DIRECTED.  Marland Kitchen losartan (COZAAR) 100 MG tablet TAKE 1 TABLET BY MOUTH EVERY DAY  . metoprolol succinate (TOPROL-XL) 50 MG 24 hr tablet Take 1 tablet (50 mg total) by mouth daily. Take with or immediately following a meal.  . metoprolol tartrate (LOPRESSOR) 50 MG tablet TAKE 1 TABLET BY MOUTH TWICE DAILY  . Multiple Vitamin (MULTIVITAMIN) tablet Take 1 tablet by mouth daily.    . NONFORMULARY OR COMPOUNDED ITEM [facility]:  Peripheral Neuropathy Cream - Bupivacaine 1%, Doxepin 3%, Gabapentin 6%, Pentoxifylline 3%, Topiramate 1%,  apply 1-2 grams to affected ares 3-4 times a day prn.  . omeprazole (PRILOSEC) 20 MG capsule TAKE 1 CAPSULE BY MOUTH DAILY  . ONE TOUCH ULTRA TEST test strip USE FOUR TIMES DAILY BEFORE MEALS AND EVERY NIGHT AT BEDTIME  . Pyridoxine HCl (VITAMIN B-6) 100 MG tablet Take 100 mg by mouth daily.    . rosuvastatin (CRESTOR) 20 MG tablet TAKE 1 TABLET(20 MG) BY MOUTH DAILY  . TOUJEO SOLOSTAR 300 UNIT/ML SOPN INJECT 46 UNITS UNDER THE SKIN DAILY  . TRULICITY 1.5 DJ/2.4QA SOPN ADMINISTER 1.5 MG(0.5ML) UNDER THE SKIN 1 TIME EVERY WEEK  . VASCEPA 1 g CAPS TAKE 2 CAPSULES BY MOUTH TWICE DAILY   No current facility-administered medications for  this visit.  (Other)      REVIEW OF SYSTEMS: ROS    Positive for: Gastrointestinal, Neurological, Genitourinary, Endocrine, Eyes   Negative for: Constitutional, Skin, Musculoskeletal, HENT, Cardiovascular, Respiratory, Psychiatric, Allergic/Imm, Heme/Lymph   Last edited by Roselee Nova D, COT on 05/16/2019 12:51 PM. (History)       ALLERGIES Allergies  Allergen Reactions  . Lipitor [Atorvastatin Calcium] Diarrhea  . Niacin And Related Hives  . Statins Diarrhea  . Macrobid [Nitrofurantoin Macrocrystal] Other (See Comments)    unknown    PAST MEDICAL HISTORY Past Medical History:  Diagnosis Date  . Anemia   . Diabetes mellitus type 2, insulin dependent ([place])   . Dyslipidemia   . GERD (gastroesophageal reflux disease)   . Hypertension   . Migraines   . Osteoporosis   . PVD (peripheral vascular disease) ([place])    abi .83 (L), .92 (R)  . RSD (reflex sympathetic dystrophy) 2007   R wrist/hand following fx  . Stroke ([place])   . TIA (transient ischemic attack)   . Varicose veins    Past Surgical History:  Procedure Laterality Date  . ABDOMINAL HYSTERECTOMY  1988  . APPENDECTOMY  1966  . BREAST CYST EXCISION    . CATARACT EXTRACTION Bilateral 10/2009  . CHOLECYSTECTOMY  1989  . Several benign cyst removed     last 1 in 1972  . TUBAL LIGATION    . UMBILICAL HERNIA REPAIR      FAMILY HISTORY Family History  Problem Relation Age of Onset  . Stroke Mother 43  . Hypertension Mother   . Clotting disorder Father   . Heart attack Father   . Arrhythmia Sister   . Stroke Brother   . Thyroid disease Neg Hx     SOCIAL HISTORY Social History   Tobacco Use  . Smoking status: Never Smoker  . Smokeless tobacco: Never Used  Substance Use Topics  . Alcohol use: No  . Drug use: No         OPHTHALMIC EXAM:  Base Eye Exam    Visual Acuity (Snellen - Linear)      Right Left   Dist cc 20/25 +2 20/20 -2   Dist ph cc NI NI   Correction: Glasses       Tonometry  (Tonopen, 1:01 PM)      Right Left   Pressure 14 16       Pupils      Dark Light Shape React APD   Right 3 2 Round Brisk None   Left 3 2 Round Brisk None       Visual Fields (Counting fingers)      Left Right    Full Full       Extraocular Movement  Right Left    Full, Ortho Full, Ortho       Neuro/Psych    Oriented x3: Yes   Mood/Affect: Normal       Dilation    Both eyes: 1.0% Mydriacyl, 2.5% Phenylephrine @ 1:01 PM        Slit Lamp and Fundus Exam    Slit Lamp Exam      Right Left   Lids/Lashes Meibomian gland dysfunction, Dermatochalasis - upper lid Meibomian gland dysfunction, Dermatochalasis - upper lid   Conjunctiva/Sclera White and quiet White and quiet   Cornea Debris in tear film, 2+ Punctate epithelial erosions, Arcus Debris in tear film, 3+ Punctate epithelial erosions, Arcus   Anterior Chamber Deep and quiet Deep and quiet   Iris Round and dilated, No NVI Round and dilated, No NVI   Lens PC IOL in good position PC IOL in good position   Vitreous mild Vitreous syneresis mild Vitreous syneresis       Fundus Exam      Right Left   Disc trace Pallor, Sharp rim trace Pallor, Sharp rim   C/D Ratio 0.4 0.5   Macula Flat, Blunted foveal reflex, scattered Microaneurysms, focal cluster of MA and exudate superior macula Flat, Blunted foveal reflex, scattered MA, no edema   Vessels Vascular attenuation, mild Tortuousity, no NVE Vascular attenuation, mildTortuousity, no NVE   Periphery Attached, mild scattered MA/DBH  Attached, scattered MA           Refraction    Wearing Rx      Sphere Cylinder Axis Add   Right -1.50 +1.25 160 +2.75   Left -1.25 +1.00 160 +2.75   Type: Bifocal          IMAGING AND PROCEDURES  Imaging and Procedures for '@TODAY'$ @  OCT, Retina - OU - Both Eyes       Right Eye Quality was good. Central Foveal Thickness: 281. Progression has been stable. Findings include normal foveal contour, no SRF, intraretinal  hyper-reflective material, intraretinal fluid (Mild, focal region on DME ST macula - stable to slightly increased from prior).   Left Eye Quality was good. Central Foveal Thickness: 269. Progression has been stable. Findings include normal foveal contour, no IRF, no SRF.   Notes *Images captured and stored on drive  Diagnosis / Impression:  OD: Mild, focal region of DME ST to macula - stable to slightly increased from prior OS: no DME  Clinical management:  See below  Abbreviations: NFP - Normal foveal profile. CME - cystoid macular edema. PED - pigment epithelial detachment. IRF - intraretinal fluid. SRF - subretinal fluid. EZ - ellipsoid zone. ERM - epiretinal membrane. ORA - outer retinal atrophy. ORT - outer retinal tubulation. SRHM - subretinal hyper-reflective material                 ASSESSMENT/PLAN:    ICD-10-CM   1. Moderate nonproliferative diabetic retinopathy of right eye with macular edema associated with type 2 diabetes mellitus ([place])  B35.3299   2. Moderate nonproliferative diabetic retinopathy of left eye without macular edema associated with type 2 diabetes mellitus ([place])  M42.6834   3. Retinal edema  H35.81 OCT, Retina - OU - Both Eyes  4. Essential hypertension  I10   5. Hypertensive retinopathy of both eyes  H35.033   6. Pseudophakia of both eyes  Z96.1     1-3. Moderate nonproliferative diabetic retinopathy w/ DME, OD  - OS without DME  - BCVA stable at 20/25 OD, 20/20 OS  -  exam shows scattered MA/IRH OU; OD with focal, non-central edema in superior macula  - OCT confirms focal DME OD as described above, OS no DME  - FA (07.14.20) shows late leaking MA OU; OD with focal hyperfluorescent leakage in area of DME  - discussed findings, prognosis and treatment options including observation, focal laser, and intravitreal anti-VEGF therapy  - recommend focal laser OD today, 10.14.20  - pt and son wish to discuss procedure with family - reasonable  - f/u  2-3 weeks - f/u and focal laser OD  4,5. Hypertensive retinopathy OU  - discussed importance of tight BP control  - monitor  6. Pseudophakia OU  - s/p CE/IOL OU (Dr. Albina Billet)  - beautiful surgeries, doing well  - monitor   Ophthalmic Meds Ordered this visit:  No orders of the defined types were placed in this encounter.      Return for f/u 2-3 weeks, NPDR OD, DFE, OCT, laser OD.  There are no Patient Instructions on file for this visit.   Explained the diagnoses, plan, and follow up with the patient and they expressed understanding.  Patient expressed understanding of the importance of proper follow up care.   This document serves as a record of services personally performed by Gardiner Sleeper, MD, PhD. It was created on their behalf by Roselee Nova, COMT. The creation of this record is the provider's dictation and/or activities during the visit.  Electronically signed by: Roselee Nova, COMT 05/16/19 10:50 PM   This document serves as a record of services personally performed by Gardiner Sleeper, MD, PhD. It was created on their behalf by Ernest Mallick, OA, an ophthalmic assistant. The creation of this record is the provider's dictation and/or activities during the visit.    Electronically signed by: Ernest Mallick, OA 10.14.2020 10:50 PM  Gardiner Sleeper, M.D., Ph.D. Diseases & Surgery of the Retina and Vitreous [facility] 05/16/19  I have reviewed the above documentation for accuracy and completeness, and I agree with the above. Gardiner Sleeper, M.D., Ph.D. 05/16/19 10:50 PM    Abbreviations: M myopia (nearsighted); A astigmatism; H hyperopia (farsighted); P presbyopia; Mrx spectacle prescription;  CTL contact lenses; OD right eye; OS left eye; OU both eyes  XT exotropia; ET esotropia; PEK punctate epithelial keratitis; PEE punctate epithelial erosions; DES dry eye syndrome; MGD meibomian gland dysfunction; ATs artificial tears; PFAT's preservative  free artificial tears; [place] nuclear sclerotic cataract; PSC posterior subcapsular cataract; ERM epi-retinal membrane; PVD posterior vitreous detachment; RD retinal detachment; DM diabetes mellitus; DR diabetic retinopathy; NPDR non-proliferative diabetic retinopathy; PDR proliferative diabetic retinopathy; CSME clinically significant macular edema; DME diabetic macular edema; dbh dot blot hemorrhages; CWS cotton wool spot; POAG primary open angle glaucoma; C/D cup-to-disc ratio; HVF humphrey visual field; GVF goldmann visual field; OCT optical coherence tomography; IOP intraocular pressure; BRVO Branch retinal vein occlusion; CRVO central retinal vein occlusion; CRAO central retinal artery occlusion; BRAO branch retinal artery occlusion; RT retinal tear; SB scleral buckle; PPV pars plana vitrectomy; VH Vitreous hemorrhage; PRP panretinal laser photocoagulation; IVK intravitreal kenalog; VMT vitreomacular traction; MH Macular hole;  NVD neovascularization of the disc; NVE neovascularization elsewhere; AREDS age related eye disease study; ARMD age related macular degeneration; POAG primary open angle glaucoma; EBMD epithelial/anterior basement membrane dystrophy; ACIOL anterior chamber intraocular lens; IOL intraocular lens; PCIOL posterior chamber intraocular lens; Phaco/IOL phacoemulsification with intraocular lens placement; [place] photorefractive keratectomy; LASIK laser assisted in situ keratomileusis; HTN hypertension; DM diabetes mellitus;  COPD chronic obstructive pulmonary disease

## 2019-05-16 ENCOUNTER — Encounter (INDEPENDENT_AMBULATORY_CARE_PROVIDER_SITE_OTHER): Payer: Self-pay | Admitting: Ophthalmology

## 2019-05-16 ENCOUNTER — Other Ambulatory Visit: Payer: Self-pay

## 2019-05-16 ENCOUNTER — Ambulatory Visit (INDEPENDENT_AMBULATORY_CARE_PROVIDER_SITE_OTHER): Payer: Medicare Other | Admitting: Ophthalmology

## 2019-05-16 DIAGNOSIS — Z961 Presence of intraocular lens: Secondary | ICD-10-CM

## 2019-05-16 DIAGNOSIS — H35033 Hypertensive retinopathy, bilateral: Secondary | ICD-10-CM

## 2019-05-16 DIAGNOSIS — H3581 Retinal edema: Secondary | ICD-10-CM | POA: Diagnosis not present

## 2019-05-16 DIAGNOSIS — E113311 Type 2 diabetes mellitus with moderate nonproliferative diabetic retinopathy with macular edema, right eye: Secondary | ICD-10-CM

## 2019-05-16 DIAGNOSIS — E113392 Type 2 diabetes mellitus with moderate nonproliferative diabetic retinopathy without macular edema, left eye: Secondary | ICD-10-CM | POA: Diagnosis not present

## 2019-05-16 DIAGNOSIS — I10 Essential (primary) hypertension: Secondary | ICD-10-CM

## 2019-05-16 LAB — HM DIABETES EYE EXAM

## 2019-05-18 ENCOUNTER — Other Ambulatory Visit: Payer: Self-pay

## 2019-05-21 ENCOUNTER — Encounter: Payer: Self-pay | Admitting: Podiatry

## 2019-05-21 ENCOUNTER — Ambulatory Visit: Payer: Medicare Other | Admitting: Podiatry

## 2019-05-21 ENCOUNTER — Other Ambulatory Visit: Payer: Self-pay

## 2019-05-21 DIAGNOSIS — E1142 Type 2 diabetes mellitus with diabetic polyneuropathy: Secondary | ICD-10-CM

## 2019-05-21 DIAGNOSIS — M79675 Pain in left toe(s): Secondary | ICD-10-CM

## 2019-05-21 DIAGNOSIS — L84 Corns and callosities: Secondary | ICD-10-CM | POA: Diagnosis not present

## 2019-05-21 DIAGNOSIS — M79674 Pain in right toe(s): Secondary | ICD-10-CM | POA: Diagnosis not present

## 2019-05-21 DIAGNOSIS — B351 Tinea unguium: Secondary | ICD-10-CM

## 2019-05-21 NOTE — Progress Notes (Signed)
Subjective: Rebecca Perkins is seen today for diabetic foot care follow up. She has h/o diabetes and neuroapathy.  She is here for corn right 2nd digit and painful, elongated, thickened toenails b/l feet that she cannot cut. Pain interferes with daily activities. Aggravating factor includes wearing enclosed shoe gear and relieved with periodic debridement.  Her son is present during the visit.  Allergies  Allergen Reactions  . Lipitor [Atorvastatin Calcium] Diarrhea  . Niacin And Related Hives  . Statins Diarrhea  . Macrobid [Nitrofurantoin Macrocrystal] Other (See Comments)    unknown    Objective:  Vascular Examination: Capillary refill time <3 seconds x 10 digits  Dorsalis pedis present b/l.  Posterior tibial pulses present b/l.  Digital hair absent b/l.  Skin temperature gradient WNL b/l.   Dermatological Examination: Skin thin and atrophied b/l LE.  Toenails 1-5 left, 1, 3, 4, 5 right discolored, thick, dystrophic with subungual debris and pain with palpation to nailbeds due to thickness of nails.  Hyperkeratotic lesion dorsal 2nd digit nailbed. No erythema, no edema, no drainage, no flocculence noted.  Musculoskeletal: Muscle strength 5/5 to all LE muscle groups  Hammertoes b/l.  No pain, crepitus or joint limitation noted with ROM.   Neurological Examination: Protective sensation absent with 10 gram monofilament bilaterally.   Assessment: Painful onychomycosis toenails 1-5 left, 1, 3, 4, 5 right Corn right 2nd digit NIDDM with neuropathy  Plan: 1. Toenails 1-5 left, 1, 3, 4, 5 right were debrided in length and girth without iatrogenic bleeding.  2. Corn right 2nd pared utilizing sterile scalpel blade without incident. Dispensed Silipos toe caps for daily comfort. 3. Patient to continue soft, supportive shoe gear. 4. Patient to report any pedal injuries to medical professional immediately. 5. Follow up 3 months.  6. Patient/POA to call should there be a  concern in the interim.

## 2019-05-21 NOTE — Patient Instructions (Signed)
Diabetes Mellitus and Foot Care Foot care is an important part of your health, especially when you have diabetes. Diabetes may cause you to have problems because of poor blood flow (circulation) to your feet and legs, which can cause your skin to:  Become thinner and drier.  Break more easily.  Heal more slowly.  Peel and crack. You may also have nerve damage (neuropathy) in your legs and feet, causing decreased feeling in them. This means that you may not notice minor injuries to your feet that could lead to more serious problems. Noticing and addressing any potential problems early is the best way to prevent future foot problems. How to care for your feet Foot hygiene  Wash your feet daily with warm water and mild soap. Do not use hot water. Then, pat your feet and the areas between your toes until they are completely dry. Do not soak your feet as this can dry your skin.  Trim your toenails straight across. Do not dig under them or around the cuticle. File the edges of your nails with an emery board or nail file.  Apply a moisturizing lotion or petroleum jelly to the skin on your feet and to dry, brittle toenails. Use lotion that does not contain alcohol and is unscented. Do not apply lotion between your toes. Shoes and socks  Wear clean socks or stockings every day. Make sure they are not too tight. Do not wear knee-high stockings since they may decrease blood flow to your legs.  Wear shoes that fit properly and have enough cushioning. Always look in your shoes before you put them on to be sure there are no objects inside.  To break in new shoes, wear them for just a few hours a day. This prevents injuries on your feet. Wounds, scrapes, corns, and calluses  Check your feet daily for blisters, cuts, bruises, sores, and redness. If you cannot see the bottom of your feet, use a mirror or ask someone for help.  Do not cut corns or calluses or try to remove them with medicine.  If you  find a minor scrape, cut, or break in the skin on your feet, keep it and the skin around it clean and dry. You may clean these areas with mild soap and water. Do not clean the area with peroxide, alcohol, or iodine.  If you have a wound, scrape, corn, or callus on your foot, look at it several times a day to make sure it is healing and not infected. Check for: ? Redness, swelling, or pain. ? Fluid or blood. ? Warmth. ? Pus or a bad smell. General instructions  Do not cross your legs. This may decrease blood flow to your feet.  Do not use heating pads or hot water bottles on your feet. They may burn your skin. If you have lost feeling in your feet or legs, you may not know this is happening until it is too late.  Protect your feet from hot and cold by wearing shoes, such as at the beach or on hot pavement.  Schedule a complete foot exam at least once a year (annually) or more often if you have foot problems. If you have foot problems, report any cuts, sores, or bruises to your health care provider immediately. Contact a health care provider if:  You have a medical condition that increases your risk of infection and you have any cuts, sores, or bruises on your feet.  You have an injury that is not   healing.  You have redness on your legs or feet.  You feel burning or tingling in your legs or feet.  You have pain or cramps in your legs and feet.  Your legs or feet are numb.  Your feet always feel cold.  You have pain around a toenail. Get help right away if:  You have a wound, scrape, corn, or callus on your foot and: ? You have pain, swelling, or redness that gets worse. ? You have fluid or blood coming from the wound, scrape, corn, or callus. ? Your wound, scrape, corn, or callus feels warm to the touch. ? You have pus or a bad smell coming from the wound, scrape, corn, or callus. ? You have a fever. ? You have a red line going up your leg. Summary  Check your feet every day  for cuts, sores, red spots, swelling, and blisters.  Moisturize feet and legs daily.  Wear shoes that fit properly and have enough cushioning.  If you have foot problems, report any cuts, sores, or bruises to your health care provider immediately.  Schedule a complete foot exam at least once a year (annually) or more often if you have foot problems. This information is not intended to replace advice given to you by your health care provider. Make sure you discuss any questions you have with your health care provider. Document Released: 07/16/2000 Document Revised: 08/31/2017 Document Reviewed: 08/20/2016 Elsevier Patient Education  2020 [employer]  

## 2019-05-22 ENCOUNTER — Ambulatory Visit: Payer: Medicare Other | Admitting: Endocrinology

## 2019-05-22 ENCOUNTER — Encounter: Payer: Self-pay | Admitting: Endocrinology

## 2019-05-22 VITALS — BP 134/66 | HR 72 | Ht 64.0 in | Wt 135.6 lb

## 2019-05-22 DIAGNOSIS — E782 Mixed hyperlipidemia: Secondary | ICD-10-CM | POA: Diagnosis not present

## 2019-05-22 DIAGNOSIS — E1165 Type 2 diabetes mellitus with hyperglycemia: Secondary | ICD-10-CM | POA: Diagnosis not present

## 2019-05-22 DIAGNOSIS — Z794 Long term (current) use of insulin: Secondary | ICD-10-CM

## 2019-05-22 LAB — LIPID PANEL
Cholesterol: 137 mg/dL (ref 0–200)
HDL: 34.1 mg/dL — ABNORMAL LOW (ref >39.00)
NonHDL: 103.26
Total CHOL/HDL Ratio: 4
Triglycerides: 386 mg/dL — ABNORMAL HIGH (ref 0.0–149.0)
VLDL: 77.2 mg/dL — ABNORMAL HIGH (ref 0.0–40.0)

## 2019-05-22 LAB — COMPREHENSIVE METABOLIC PANEL
ALT: 16 U/L (ref 0–35)
AST: 19 U/L (ref 0–37)
Albumin: 3.9 g/dL (ref 3.5–5.2)
Alkaline Phosphatase: 51 U/L (ref 39–117)
BUN: 20 mg/dL (ref 6–23)
CO2: 31 mEq/L (ref 19–32)
Calcium: 10.1 mg/dL (ref 8.4–10.5)
Chloride: 102 mEq/L (ref 96–112)
Creatinine, Ser: 0.93 mg/dL (ref 0.40–1.20)
GFR: 56.9 mL/min — ABNORMAL LOW (ref >60.00)
Glucose, Bld: 169 mg/dL — ABNORMAL HIGH (ref 70–99)
Potassium: 3.7 mEq/L (ref 3.5–5.1)
Sodium: 139 mEq/L (ref 135–145)
Total Bilirubin: 0.5 mg/dL (ref 0.2–1.2)
Total Protein: 7.1 g/dL (ref 6.0–8.3)

## 2019-05-22 LAB — POCT GLYCOSYLATED HEMOGLOBIN (HGB A1C): Hemoglobin A1C: 7.8 % — AB (ref 4.0–5.6)

## 2019-05-22 LAB — LDL CHOLESTEROL, DIRECT: Direct LDL: 39 mg/dL

## 2019-05-22 NOTE — Patient Instructions (Addendum)
Toujeo 28 units  Humalog 4 units at supper, when starting to eat, not later  Check blood sugars on waking up 7 days a week  Also check blood sugars about 2 hours after meals and do this after different meals by rotation  Recommended blood sugar levels on waking up are 90-130 and about 2 hours after meal is 130-180  Please bring your blood sugar monitor to each visit, thank you

## 2019-05-22 NOTE — Progress Notes (Signed)
Patient ID: Rebecca Perkins, female   DOB: 08-24-1928, 85 y.o.   MRN: [id number]            Reason for Appointment: Follow-up for Type 2 Diabetes  Referring physician: Pickard  History of Present Illness:          Diagnosis: Type 2 diabetes mellitus, date of diagnosis: 2006       Past history:  At the time of diagnosis she was feeling tired and weak but does not know what her initial blood sugar was. She probably was tried on metformin initially but because of GI side effects discussed not be continued Also did not tolerate Actos because of nausea. She thinks that she was started on insulin within a few months of her initial diagnosis  Not clear what insulin she was trying in the beginning but she thinks it was NovoLog; however for the last few years has been on Lantus only Her blood sugars have been persistently poorly controlled with A1c usually 8-9% about 2011 At initial consultation she was taking 60 units of Lantus insulin without any mealtime coverage On her visit in 3/17 because of her high A1c she was advised to start taking mealtime insulin at suppertime  In 09/2016 because of A1c being 9.9 she was told to start Victoza in addition to her basal bolus insulin regimen  Recent history:   INSULIN regimen is: 31 units Toujeo in a.m., Humalog: None  Non-insulin hypoglycemic drugs the patient is taking are: Taking Trulicity 1.5 mg weekly:     Her A1c is now fairly stable at 7.8  Current management and history:   She is taking her insulin consistently in the morning with her other medications  She said that she is usually eating significant amount of carbohydrate at dinnertime with sandwiches or other starchy foods such as potato soup.  Usually making sandwich herself  Otherwise not eating high-fat meals except some at breakfast  She will have a regular soft drink sometimes early afternoon which may also cause her sugars to be high but not daily  Blood sugars are well over  200 average after supper and fairly consistently high  Fasting blood sugars are excellent although on her Elenor Legato may be low normal at times, lowest reading 77 in the last 2 weeks on her download  She does need her significant meal at breakfast but occasionally has some protein with this and blood sugars are on an average not above her target of 180  She takes her Trulicity regularly on Saturdays  Lowest blood sugar appears to be 69 before dinner last Friday but she does not remember having symptoms.  After this she overtreated the low sugar and the next morning her blood sugar was almost 300  She does a little walking around in her home but no formal exercise  Her weight is about the same   Meals: 2-3 meals per day. Breakfast is variable, bacon, fruit, egg    Dinner variable times     Side effects from medications have been: Metformin, Actos: nausea, diarrhea    Compliance with the medical regimen: Fair  Hypoglycemia: None  but she feels a little hypoglycemic when blood sugars are around 80  Glucose monitoring:          Glucometer:   Freestyle libre  CONTINUOUS GLUCOSE MONITORING RECORD INTERPRETATION    Dates of Recording: 10/7 through 10/20  Sensor description: Freestyle libre  Results statistics:   CGM use % of time  77  Average and GV  158+/-34  Time in range     67   %  % Time Above 180  26  % Time above 250  5  % Time Below target  level 2    Glycemic patterns summary:   HIGHEST blood sugars are late at night between 10 PM-2 AM Lowest blood sugars are early morning before 8 AM There is much more variability in her evening blood sugars No hypoglycemia  Hyperglycemic episodes are occurring fairly consistently after evening meal neurologically after breakfast or lunch to a variable extent  Hypoglycemic episodes are minimal with only one low normal sugar of 69 at suppertime around 10 PM  Overnight periods: Blood sugars after bedtime are decreasing progressively  down to near normal levels in the morning  Preprandial periods: Blood sugars are excellent at breakfast time She is usually not eating her lunch At dinnertime her blood sugars are variable and about 150-160 average  Postprandial periods:   After breakfast: Blood sugars are rising about 40-50 mg  After lunch: Blood sugars may go up if she is having some regular soft pain more starchy snacks  After dinner: Blood sugars are going up fairly significantly most of the time to at least 170 or more and averaging over 200 late at night   PRE-MEAL Fasting Lunch Dinner Bedtime Overall  Glucose range:  77-297      Mean/median:  104  159  150   158   POST-MEAL PC Breakfast PC Lunch PC Dinner  Glucose range:      Mean/median:  155  170 224                Exercise: none  Dietician visit, most recent none               Weight history: Highest weight has been 170  Wt Readings from Last 3 Encounters:  05/22/19 135 lb 9.6 oz (61.5 kg)  12/21/18 133 lb (60.3 kg)  10/27/18 133 lb (60.3 kg)    Glycemic control:   Lab Results  Component Value Date   HGBA1C 7.8 (A) 05/22/2019   HGBA1C 7.9 (A) 12/21/2018   HGBA1C 7.8 (A) 09/19/2018   Lab Results  Component Value Date   MICROALBUR 3.8 (H) 06/06/2018   LDLCALC 52 10/21/2017   CREATININE 0.97 12/21/2018    Office Visit on 05/22/2019  Component Date Value Ref Range Status  . Hemoglobin A1C 05/22/2019 7.8* 4.0 - 5.6 % Final      Allergies as of 05/22/2019      Reactions   Lipitor [atorvastatin Calcium] Diarrhea   Niacin And Related Hives   Statins Diarrhea   Macrobid [nitrofurantoin Macrocrystal] Other (See Comments)   unknown      Medication List       Accurate as of May 22, 2019  1:19 PM. If you have any questions, ask your nurse or doctor.        CALTRATE 600+D PO Take by mouth 2 (two) times daily.   clopidogrel 75 MG tablet Commonly known as: PLAVIX TAKE 1 TABLET BY MOUTH DAILY   COQ10 PO Take by mouth.    denosumab 60 MG/ML Sosy injection Commonly known as: PROLIA Inject into the skin every 6 (six) months.   donepezil 5 MG tablet Commonly known as: ARICEPT Take 1 tablet (5 mg total) by mouth at bedtime. What changed: Another medication with the same name was removed. Continue taking this medication, and follow the directions you see here.  Changed by: Elayne Snare, MD   FreeStyle Libre 14 Day Sensor Misc USE EVERY 14 DAYS   HumaLOG KwikPen 100 UNIT/ML KwikPen Generic drug: insulin lispro Inject into the skin. Use only if blood sugar is over 200 in the am. What changed: Another medication with the same name was removed. Continue taking this medication, and follow the directions you see here. Changed by: Elayne Snare, MD   hydrochlorothiazide 12.5 MG capsule Commonly known as: MICROZIDE TAKE 1 CAPSULE(12.5 MG) BY MOUTH DAILY   Insulin Pen Needle 31G X 8 MM Misc Commonly known as: B-D ULTRAFINE III SHORT PEN INJECT AS DIRECTED 4 TIMES DAILY   INSULIN SYRINGE 1CC/29G 29G X 1/2" 1 ML Misc AS DIRECTED.   losartan 100 MG tablet Commonly known as: COZAAR TAKE 1 TABLET BY MOUTH EVERY DAY   metoprolol succinate 50 MG 24 hr tablet Commonly known as: TOPROL-XL Take 1 tablet (50 mg total) by mouth daily. Take with or immediately following a meal.   metoprolol tartrate 50 MG tablet Commonly known as: LOPRESSOR TAKE 1 TABLET BY MOUTH TWICE DAILY   multivitamin tablet Take 1 tablet by mouth daily.   NONFORMULARY OR COMPOUNDED ITEM [facility]:  Peripheral Neuropathy Cream - Bupivacaine 1%, Doxepin 3%, Gabapentin 6%, Pentoxifylline 3%, Topiramate 1%, apply 1-2 grams to affected ares 3-4 times a day prn.   omeprazole 20 MG capsule Commonly known as: PRILOSEC TAKE 1 CAPSULE BY MOUTH DAILY   ONE TOUCH ULTRA SYSTEM KIT w/Device Kit 1 kit by Does not apply route once. Tests Blood sugar before meals and at bedtime   ONE TOUCH ULTRA TEST test strip Generic drug: glucose blood USE  FOUR TIMES DAILY BEFORE MEALS AND EVERY NIGHT AT BEDTIME   polyvinyl alcohol 1.4 % ophthalmic solution Commonly known as: LIQUIFILM TEARS 1 drop as needed for dry eyes.   pyridOXINE 100 MG tablet Commonly known as: VITAMIN B-6 Take 100 mg by mouth daily.   rosuvastatin 20 MG tablet Commonly known as: CRESTOR TAKE 1 TABLET(20 MG) BY MOUTH DAILY   Toujeo SoloStar 300 UNIT/ML Sopn Generic drug: Insulin Glargine (1 Unit Dial) Inject 31 Units into the skin. Inject 31 units under the skin once daily. What changed: Another medication with the same name was removed. Continue taking this medication, and follow the directions you see here. Changed by: Elayne Snare, MD   Toujeo SoloStar 300 UNIT/ML Sopn Generic drug: Insulin Glargine (1 Unit Dial) INJECT 46 UNITS UNDER THE SKIN DAILY What changed: Another medication with the same name was removed. Continue taking this medication, and follow the directions you see here. Changed by: Elayne Snare, MD   Trulicity 1.5 LT/9.0ZE Sopn Generic drug: Dulaglutide ADMINISTER 1.5 MG(0.5ML) UNDER THE SKIN 1 TIME EVERY WEEK   Vascepa 1 g Caps Generic drug: Icosapent Ethyl TAKE 2 CAPSULES BY MOUTH TWICE DAILY       Allergies:  Allergies  Allergen Reactions  . Lipitor [Atorvastatin Calcium] Diarrhea  . Niacin And Related Hives  . Statins Diarrhea  . Macrobid [Nitrofurantoin Macrocrystal] Other (See Comments)    unknown    Past Medical History:  Diagnosis Date  . Anemia   . Diabetes mellitus type 2, insulin dependent ([place])   . Dyslipidemia   . GERD (gastroesophageal reflux disease)   . Hypertension   . Migraines   . Osteoporosis   . PVD (peripheral vascular disease) ([place])    abi .83 (L), .92 (R)  . RSD (reflex sympathetic dystrophy) 2007   R wrist/hand following  fx  . Stroke ([place])   . TIA (transient ischemic attack)   . Varicose veins     Past Surgical History:  Procedure Laterality Date  . ABDOMINAL HYSTERECTOMY  1988  .  APPENDECTOMY  1966  . BREAST CYST EXCISION    . CATARACT EXTRACTION Bilateral 10/2009  . CHOLECYSTECTOMY  1989  . Several benign cyst removed     last 1 in 1972  . TUBAL LIGATION    . UMBILICAL HERNIA REPAIR      Family History  Problem Relation Age of Onset  . Stroke Mother 62  . Hypertension Mother   . Clotting disorder Father   . Heart attack Father   . Arrhythmia Sister   . Stroke Brother   . Thyroid disease Neg Hx     Social History:  reports that she has never smoked. She has never used smokeless tobacco. She reports that she does not drink alcohol or use drugs.    Review of Systems      She is on Crestor 20 mg and 4 g Vascepa Triglycerides tend to be high, followed by PCP She has not had any fasting labs recently She does take Vascepa generally regularly although occasionally will miss evening doses      Lab Results  Component Value Date   CHOL 140 06/06/2018   HDL 35.60 (L) 06/06/2018   LDLCALC 52 10/21/2017   LDLDIRECT 45.0 06/06/2018   TRIG 303.0 (H) 06/06/2018   CHOLHDL 4 06/06/2018                   Thyroid:    On exam has had a 3 cm nodule on the right side but she has refused to consider biopsy This has not increased in size on subsequent exams, last checked in 5/20  Lab Results  Component Value Date   TSH 1.61 12/21/2018       The blood pressure has been managed with losartan 100 mg, HCTZ and metoprolol, Followed by PCP Blood pressure is consistently controlled      BP Readings from Last 3 Encounters:  05/22/19 134/66  12/21/18 138/64  10/27/18 128/64        Diabetic neuropathy: She has a history of Numbness in her feet and toes    Last foot exam was in 12/2018 showing sensory loss in the distal feet  On Prolia from her PCP for osteoporosis   Physical Examination:  BP 134/66 (BP Location: Left Arm, Patient Position: Sitting, Cuff Size: Normal)   Pulse 72   Ht '5\' 4"'$  (1.626 m)   Wt 135 lb 9.6 oz (61.5 kg)   SpO2 93%   BMI 23.28  kg/m     ASSESSMENT /PLAN:  Diabetes type 2, insulin requiring with neuropathy     See history of present illness for detailed discussion of her current management and problems identified as well as blood sugar patterns  Her A1c is 7.8  She is on combination of basal insulin and Trulicity Despite 1.5 mg Trulicity she continues to have significantly high readings after her evening meal because of eating carbohydrates like sandwiches Highest blood sugar has been nearly 350 postprandially  No hypoglycemia except once her sugar was 69 because of delaying her evening meal  Currently still not blood sugars often enough time data available no need for 67% of the last 2 weeks duration CGM interpretation above  Her son was present in the office and discussed her management She agrees to try 4 units  of Humalog at suppertime when she is making her sandwich With this she will reduce her Toujeo down to 28 units Continue Trulicity  LIPIDS: Check labs today  Neuropathy: She has seen a podiatrist this month for foot care  She has had her influenza vaccine  Counseling time on subjects discussed in assessment and plan sections is over 50% of today's 25 minute visit   Patient Instructions  Toujeo 28 units Humalog 4 units at supper      Elayne Snare 05/22/2019, 1:19 PM       Note: This office note was prepared with Dragon voice recognition system technology. Any transcriptional errors that result from this process are unintentional.      Elayne Snare 05/22/19

## 2019-05-25 ENCOUNTER — Other Ambulatory Visit: Payer: Self-pay

## 2019-05-25 MED ORDER — FENOFIBRATE 160 MG PO TABS: 160.0000 mg | ORAL_TABLET | Freq: Every day | ORAL | 0 refills | Status: DC

## 2019-06-04 ENCOUNTER — Ambulatory Visit (INDEPENDENT_AMBULATORY_CARE_PROVIDER_SITE_OTHER): Payer: Medicare Other | Admitting: Family Medicine

## 2019-06-04 ENCOUNTER — Other Ambulatory Visit: Payer: Self-pay

## 2019-06-04 ENCOUNTER — Encounter: Payer: Self-pay | Admitting: Family Medicine

## 2019-06-04 VITALS — BP 146/82 | HR 70 | Temp 97.1°F | Resp 16 | Ht 64.0 in | Wt 135.0 lb

## 2019-06-04 DIAGNOSIS — Z794 Long term (current) use of insulin: Secondary | ICD-10-CM | POA: Diagnosis not present

## 2019-06-04 DIAGNOSIS — E119 Type 2 diabetes mellitus without complications: Secondary | ICD-10-CM | POA: Diagnosis not present

## 2019-06-04 DIAGNOSIS — Z8673 Personal history of transient ischemic attack (TIA), and cerebral infarction without residual deficits: Secondary | ICD-10-CM

## 2019-06-04 DIAGNOSIS — R413 Other amnesia: Secondary | ICD-10-CM | POA: Diagnosis not present

## 2019-06-04 NOTE — Progress Notes (Signed)
Subjective:    Patient ID: Rebecca Perkins, female    DOB: 01-May-1929, 85 y.o.   MRN: [id number] 10/27/18 Patient recently had a neuropsychological evaluation.  I have copied the results below for my reference: "This patient, with a history of cerebrovascular disease, is currently functioning in the mildly impaired range of overall intellectual ability.  Her scores are much lower than that predicted by her academic and vocational history, suggesting evidence of least moderate global intellectual decline.  She has some difficulty on a test of sustained attention, especially when there is an interface component.  She shows various degrees of deficit in her memory for both auditory verbal and visual information, both at immediate recall, as well as after a delay.  She showed a mild expressive language or word finding deficit and naming but no deficit in fluency.  Comprehension appears spared.  She showed a mild deficit in her ability to perceive spatial relationships.  She has a tremendous amount difficulty on a rather simple and straightforward tasks of executive functions, especially when the task involved shifting cognitive set.  She is reporting severe anxiety and mild depression.  However, affective disorder alone would not account for all the deficits in the test battery.  She showed some degree of deficit across virtually every domain of this rather extensive battery.  This pattern is more consistent with a degenerative process rather than focal or lateralized deficit such as one might expect from a stroke."  RECOMMENDATIONS: 1.  It is recommended that the patient's physician work with her to find a medication regimen that more thoroughly addresses her severe anxiety 2.  It is recommended that the family consider having someone such as a home health aide or find a family member stay with her at night to prevent going out at 2 AM for otherwise monitoring 3.  Is recommended that the patient continue  to limit her driving to very familiar routes close to home and only in daylight hours  She is here today to discuss the results with her son as well as to discuss treatment options.  Son states that while her daughter was here staying with her, she has been doing better.  Apparently before this, the patient was having hallucinations frequently.  At night she would believe that she would see someone in the house.  She would see them walking down the hall.  She would hear them talking in another room.  At times she became so concerned that she would call her sons convinced that someone was in her home.  Since her daughter has been staying with her at night, these events have almost stopped however they have seen persistent problems with short-term memory.  Patient is still driving but only during the daytime.  She drives on 2 lane roads.  She is able today to easily answer my questions and shows normal insight and good judgment.  She shows insight into knowing that the hallucinations were not real.  However she states that the time they seemed very real.  She denies any depression.  Her anxiety that they discuss was primarily due to the fears of having someone else in the home.  However today she denies any depression or anxiety or suicidal thoughts.  Both her and her son deny any delusional thoughts.  She is eating well.  She still self administering her medication.  At that time, my plan was: I spent more than 30 minutes today in the room with the patient and her  son going over her exam results and discussing her treatment options.  We discussed adding a medication such as Abilify to try to decrease the frequency of hallucinations.  However at the present time we have elected to start Aricept 5 mg a day for dementia and increase to 10 mg a day in 1 month if she is tolerating the medication well.  I have recommended that family member stay with her at night.  I also recommended that family number administer the  insulin to avoid improper dosing.  We will also switch her Lopressor to Toprol-XL 50 mg once a day to avoid her missing the nighttime dose.  She frequently forgets to take her medications at night.  I believe that having a family member stay with her will prevent the delusions and hallucinations at night, decrease the frequency of sundowning, assist in keeping her more oriented, help in administering medication.  Consider adding Namenda if dementia and memory loss worsen despite increasing Aricept to 10 mg over the next few months.  06/04/19 Patient is here today with her son for follow-up.  The purpose of today's visit was to reassess her memory.  When I last saw her I started her on Aricept 5 mg a day.  We increased it to 10 mg a day however she had gastrointestinal side effects from this.  Primarily she experienced diarrhea.  Therefore she decreased back to 5 mg a day.  Over the last 6 months, her son states that she has been doing relatively well.  He thinks that she is actually doing better than before.  She is not hallucinating.  She is not having delusions or hallucinations at night.  She has more commonly started to repeat herself.  For instance her son states that she is telling them the same story over and over and over with no recollection of having told that story just an hour or 2 before.  However aside from that, the patient has been doing well.  Patient and her son have asked to transfer the management of her diabetes to this office.  Apparently there has been communication issues with her endocrinologist.  Given her dementia, they would like to have a start to manage her medical conditions to avoid confusion for the patient.  At present she is on 31 units of long-acting insulin that she takes first thing in the morning.  She is on Trulicity.  She does not feel comfortable adding quick acting mealtime insulin due to hypoglycemia.  Her most recent hemoglobin A1c in October was 7.8.  Her triglycerides  were also over 300 despite taking Crestor and Vascepa.  However she was not fasting for that lab work and therefore the accuracy of the triglycerides is in question.  Past Medical History:  Diagnosis Date  . Anemia   . Diabetes mellitus type 2, insulin dependent ([place])   . Dyslipidemia   . GERD (gastroesophageal reflux disease)   . Hypertension   . Migraines   . Osteoporosis   . PVD (peripheral vascular disease) ([place])    abi .83 (L), .92 (R)  . RSD (reflex sympathetic dystrophy) 2007   R wrist/hand following fx  . Stroke ([place])   . TIA (transient ischemic attack)   . Varicose veins    Past Surgical History:  Procedure Laterality Date  . ABDOMINAL HYSTERECTOMY  1988  . APPENDECTOMY  1966  . BREAST CYST EXCISION    . CATARACT EXTRACTION Bilateral 10/2009  . CHOLECYSTECTOMY  1989  . Several  benign cyst removed     last 1 in 1972  . TUBAL LIGATION    . UMBILICAL HERNIA REPAIR     Current Outpatient Medications on File Prior to Visit  Medication Sig Dispense Refill  . Blood Glucose Monitoring Suppl (ONE TOUCH ULTRA SYSTEM KIT) W/DEVICE KIT 1 kit by Does not apply route once. Tests Blood sugar before meals and at bedtime 1 each 0  . Calcium Carbonate-Vitamin D (CALTRATE 600+D PO) Take by mouth 2 (two) times daily.    . clopidogrel (PLAVIX) 75 MG tablet TAKE 1 TABLET BY MOUTH DAILY 90 tablet 1  . Coenzyme Q10 (COQ10 PO) Take by mouth.    . Continuous Blood Gluc Sensor (FREESTYLE LIBRE 14 DAY SENSOR) MISC USE EVERY 14 DAYS 2 each 5  . denosumab (PROLIA) 60 MG/ML SOSY injection Inject into the skin every 6 (six) months.     . donepezil (ARICEPT) 5 MG tablet Take 1 tablet (5 mg total) by mouth at bedtime. 30 tablet 5  . hydrochlorothiazide (MICROZIDE) 12.5 MG capsule TAKE 1 CAPSULE(12.5 MG) BY MOUTH DAILY 30 capsule 5  . Insulin Glargine, 1 Unit Dial, (TOUJEO SOLOSTAR) 300 UNIT/ML SOPN Inject 31 Units into the skin. Inject 31 units under the skin once daily.    . insulin lispro (HUMALOG  KWIKPEN) 100 UNIT/ML KwikPen Inject into the skin. Use only if blood sugar is over 200 in the am.    . Insulin Pen Needle (B-D ULTRAFINE III SHORT PEN) 31G X 8 MM MISC INJECT AS DIRECTED 4 TIMES DAILY 300 each 0  . INSULIN SYRINGE 1CC/29G 29G X 1/2" 1 ML MISC AS DIRECTED. 100 each 3  . losartan (COZAAR) 100 MG tablet TAKE 1 TABLET BY MOUTH EVERY DAY 90 tablet 3  . metoprolol succinate (TOPROL-XL) 50 MG 24 hr tablet Take 1 tablet (50 mg total) by mouth daily. Take with or immediately following a meal. 90 tablet 3  . Multiple Vitamin (MULTIVITAMIN) tablet Take 1 tablet by mouth daily.      . NONFORMULARY OR COMPOUNDED ITEM [facility]:  Peripheral Neuropathy Cream - Bupivacaine 1%, Doxepin 3%, Gabapentin 6%, Pentoxifylline 3%, Topiramate 1%, apply 1-2 grams to affected ares 3-4 times a day prn. 100 each 5  . omeprazole (PRILOSEC) 20 MG capsule TAKE 1 CAPSULE BY MOUTH DAILY 90 capsule 3  . ONE TOUCH ULTRA TEST test strip USE FOUR TIMES DAILY BEFORE MEALS AND EVERY NIGHT AT BEDTIME 150 each 5  . polyvinyl alcohol (LIQUIFILM TEARS) 1.4 % ophthalmic solution 1 drop as needed for dry eyes.    . Pyridoxine HCl (VITAMIN B-6) 100 MG tablet Take 100 mg by mouth daily.      . rosuvastatin (CRESTOR) 20 MG tablet TAKE 1 TABLET(20 MG) BY MOUTH DAILY 90 tablet 3  . TRULICITY 1.5 QI/3.4VQ SOPN ADMINISTER 1.5 MG(0.5ML) UNDER THE SKIN 1 TIME EVERY WEEK 2 mL 3  . VASCEPA 1 g CAPS TAKE 2 CAPSULES BY MOUTH TWICE DAILY 360 capsule 3  . fenofibrate 160 MG tablet Take 1 tablet (160 mg total) by mouth daily. (Patient not taking: Reported on 06/04/2019) 30 tablet 0   No current facility-administered medications on file prior to visit.    Allergies  Allergen Reactions  . Lipitor [Atorvastatin Calcium] Diarrhea  . Niacin And Related Hives  . Statins Diarrhea  . Macrobid [Nitrofurantoin Macrocrystal] Other (See Comments)    unknown   Social History   Socioeconomic History  . Marital status: Widowed    Spouse  name: Not on file  . Number of children: 3  . Years of education: Not on file  . Highest education level: Not on file  Occupational History  . Occupation: Retired  Scientific laboratory technician  . Financial resource strain: Not on file  . Food insecurity    Worry: Not on file    Inability: Not on file  . Transportation needs    Medical: Not on file    Non-medical: Not on file  Tobacco Use  . Smoking status: Never Smoker  . Smokeless tobacco: Never Used  Substance and Sexual Activity  . Alcohol use: No  . Drug use: No  . Sexual activity: Not on file  Lifestyle  . Physical activity    Days per week: Not on file    Minutes per session: Not on file  . Stress: Not on file  Relationships  . Social Herbalist on phone: Not on file    Gets together: Not on file    Attends religious service: Not on file    Active member of club or organization: Not on file    Attends meetings of clubs or organizations: Not on file    Relationship status: Not on file  . Intimate partner violence    Fear of current or ex partner: Not on file    Emotionally abused: Not on file    Physically abused: Not on file    Forced sexual activity: Not on file  Other Topics Concern  . 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      Objective:   Physical Exam Vitals signs reviewed.  Constitutional:      Appearance: She is well-developed.  Neck:     Vascular: No JVD.  Cardiovascular:     Rate and Rhythm: Normal rate and regular rhythm.  Pulmonary:     Effort: Pulmonary effort is normal. No respiratory distress.     Breath sounds: Normal breath sounds. No stridor. No wheezing or rales.  Abdominal:     General: Bowel sounds are normal.     Palpations: Abdomen is soft.  Skin:    Findings: No erythema.            Assessment & Plan:  Memory loss  History of CVA (cerebrovascular accident)  Diabetes mellitus type 2, insulin dependent ([place])  Patient's memory loss seems stable.  Ultimately the son and the patient both elected continue Aricept at his current dose.  We will reassess every 3 to 6 months and if worsening, either increase Aricept to 10 or add Namenda.  Regarding her triglycerides, I have asked the patient to return fasting at her earliest convenience and we can recheck them.  Given her advanced age and polypharmacy I would not add fenofibrate. Triglycerides are well above 300.  I spoke very highly of her endocrinologist Dr. Dwyane Dee.  I believe he has done an outstanding job.  However for a variety of issues, they would like one doctor to manage her issues primarily due to her worsening dementia.  We had a long discussion today about goals of care.  Ultimately given her independent living situation and increasing confusion, I would be happy with her hemoglobin A1c between 7 and 8.  Therefore we will make no changes to  her medication at the present time.

## 2019-06-06 NOTE — Progress Notes (Signed)
[facility] Note  06/11/2019     CHIEF COMPLAINT Patient presents for Retina Follow Up   HISTORY OF PRESENT ILLNESS: Rebecca Perkins is a 85 y.o. female who presents to the clinic today for:   HPI    Retina Follow Up    Patient presents with  Diabetic Retinopathy.  In both eyes.  This started months ago.  Severity is moderate.  Duration of 3 weeks.  Since onset it is stable.  I, the attending physician,  performed the HPI with the patient and updated documentation appropriately.          Comments    85 y/o female pt here for 3 wk f/u for mod NPDR OU.  Here for laser OD today.  No change in [place] OU.  Denies pain, flashes, floaters.  AT TID OU.  BS 125 this a.m.  A1C 7.3 2 wks ago.       Last edited by Bernarda Caffey, MD on 06/11/2019  4:31 PM. (History)    pt states she is ready for laser treatment today, she feels like her vision is pretty good     Referring physician: Luberta Mutter, MD [place],  [place] [id number]  HISTORICAL INFORMATION:   Selected notes from the MEDICAL RECORD NUMBER Referred by Dr. Luberta Mutter for concern of DME LEE: (C. [facility]) [BCVA: OD: 20/20- OS: 20/20-] Ocular Hx-pseudo OU PMH-DM (last Y1E: 7.9, takes trulicity, Humalog, toujeo) , anemia, mild dementia, TIA's   CURRENT MEDICATIONS: Current Outpatient Medications (Ophthalmic Drugs)  Medication Sig  . polyvinyl alcohol (LIQUIFILM TEARS) 1.4 % ophthalmic solution 1 drop as needed for dry eyes.  . prednisoLONE acetate (PRED FORTE) 1 % ophthalmic suspension Place 1 drop into the right eye 4 (four) times daily for 7 days.   No current facility-administered medications for this visit.  (Ophthalmic Drugs)   Current Outpatient Medications (Other)  Medication Sig  . Blood Glucose Monitoring Suppl (ONE TOUCH ULTRA SYSTEM KIT) W/DEVICE KIT 1 kit by Does not apply route once. Tests Blood sugar before meals and at bedtime  . Calcium Carbonate-Vitamin D  (CALTRATE 600+D PO) Take by mouth 2 (two) times daily.  . clopidogrel (PLAVIX) 75 MG tablet TAKE 1 TABLET BY MOUTH DAILY  . Coenzyme Q10 (COQ10 PO) Take by mouth.  . Continuous Blood Gluc Sensor (FREESTYLE LIBRE 14 DAY SENSOR) MISC USE EVERY 14 DAYS  . denosumab (PROLIA) 60 MG/ML SOSY injection Inject into the skin every 6 (six) months.   . donepezil (ARICEPT) 5 MG tablet Take 1 tablet (5 mg total) by mouth at bedtime.  . fenofibrate 160 MG tablet Take 1 tablet (160 mg total) by mouth daily. (Patient not taking: Reported on 06/04/2019)  . hydrochlorothiazide (MICROZIDE) 12.5 MG capsule TAKE 1 CAPSULE(12.5 MG) BY MOUTH DAILY  . Insulin Glargine, 1 Unit Dial, (TOUJEO SOLOSTAR) 300 UNIT/ML SOPN Inject 31 Units into the skin. Inject 31 units under the skin once daily.  . insulin lispro (HUMALOG KWIKPEN) 100 UNIT/ML KwikPen Inject into the skin. Use only if blood sugar is over 200 in the am.  . Insulin Pen Needle (B-D ULTRAFINE III SHORT PEN) 31G X 8 MM MISC INJECT AS DIRECTED 4 TIMES DAILY  . INSULIN SYRINGE 1CC/29G 29G X 1/2" 1 ML MISC AS DIRECTED.  Marland Kitchen losartan (COZAAR) 100 MG tablet TAKE 1 TABLET BY MOUTH EVERY DAY  . metoprolol succinate (TOPROL-XL) 50 MG 24 hr tablet Take 1 tablet (50 mg total)  by mouth daily. Take with or immediately following a meal.  . Multiple Vitamin (MULTIVITAMIN) tablet Take 1 tablet by mouth daily.    . NONFORMULARY OR COMPOUNDED ITEM [facility]:  Peripheral Neuropathy Cream - Bupivacaine 1%, Doxepin 3%, Gabapentin 6%, Pentoxifylline 3%, Topiramate 1%, apply 1-2 grams to affected ares 3-4 times a day prn.  . omeprazole (PRILOSEC) 20 MG capsule TAKE 1 CAPSULE BY MOUTH DAILY  . ONE TOUCH ULTRA TEST test strip USE FOUR TIMES DAILY BEFORE MEALS AND EVERY NIGHT AT BEDTIME  . Pyridoxine HCl (VITAMIN B-6) 100 MG tablet Take 100 mg by mouth daily.    . rosuvastatin (CRESTOR) 20 MG tablet TAKE 1 TABLET(20 MG) BY MOUTH DAILY  . TRULICITY 1.5 ZD/6.6YQ SOPN ADMINISTER 1.5  MG(0.5ML) UNDER THE SKIN 1 TIME EVERY WEEK  . VASCEPA 1 g CAPS TAKE 2 CAPSULES BY MOUTH TWICE DAILY   No current facility-administered medications for this visit.  (Other)      REVIEW OF SYSTEMS: ROS    Positive for: Gastrointestinal, Neurological, Genitourinary, Musculoskeletal, HENT, Endocrine, Eyes   Negative for: Constitutional, Skin, Cardiovascular, Respiratory, Psychiatric, Allergic/Imm, Heme/Lymph   Last edited by Matthew Folks, COA on 06/11/2019  1:56 PM. (History)       ALLERGIES Allergies  Allergen Reactions  . Lipitor [Atorvastatin Calcium] Diarrhea  . Niacin And Related Hives  . Statins Diarrhea  . Macrobid [Nitrofurantoin Macrocrystal] Other (See Comments)    unknown    PAST MEDICAL HISTORY Past Medical History:  Diagnosis Date  . Anemia   . Diabetes mellitus type 2, insulin dependent ([place])   . Diabetic retinopathy ([place])    NPDR OU  . Dyslipidemia   . GERD (gastroesophageal reflux disease)   . Hypertension   . Hypertensive retinopathy    OU  . Migraines   . Osteoporosis   . PVD (peripheral vascular disease) ([place])    abi .83 (L), .92 (R)  . RSD (reflex sympathetic dystrophy) 2007   R wrist/hand following fx  . Stroke ([place])   . TIA (transient ischemic attack)   . Varicose veins    Past Surgical History:  Procedure Laterality Date  . ABDOMINAL HYSTERECTOMY  1988  . APPENDECTOMY  1966  . BREAST CYST EXCISION    . CATARACT EXTRACTION Bilateral 10/2009  . CHOLECYSTECTOMY  1989  . EYE SURGERY Bilateral    Cat Sx OU  . Several benign cyst removed     last 1 in 1972  . TUBAL LIGATION    . UMBILICAL HERNIA REPAIR      FAMILY HISTORY Family History  Problem Relation Age of Onset  . Stroke Mother 89  . Hypertension Mother   . Clotting disorder Father   . Heart attack Father   . Arrhythmia Sister   . Stroke Brother   . Thyroid disease Neg Hx     SOCIAL HISTORY Social History   Tobacco Use  . Smoking status: Never Smoker  . Smokeless  tobacco: Never Used  Substance Use Topics  . Alcohol use: No  . Drug use: No         OPHTHALMIC EXAM:  Base Eye Exam    Visual Acuity (Snellen - Linear)      Right Left   Dist cc 20/25 20/20 -2   Dist ph cc NI    Correction: Glasses       Tonometry (Tonopen, 2:00 PM)      Right Left   Pressure 11 12  Pupils      Dark Light Shape React APD   Right 3 2 Round Brisk None   Left 3 2 Round Brisk None       Visual Fields (Counting fingers)      Left Right    Full Full       Extraocular Movement      Right Left    Full, Ortho Full, Ortho       Neuro/Psych    Oriented x3: Yes   Mood/Affect: Normal       Dilation    Both eyes: 1.0% Mydriacyl, 2.5% Phenylephrine @ 2:00 PM        Slit Lamp and Fundus Exam    Slit Lamp Exam      Right Left   Lids/Lashes Meibomian gland dysfunction, Dermatochalasis - upper lid Meibomian gland dysfunction, Dermatochalasis - upper lid   Conjunctiva/Sclera White and quiet White and quiet   Cornea Debris in tear film, 2+ Punctate epithelial erosions, Arcus Debris in tear film, 3+ Punctate epithelial erosions, Arcus   Anterior Chamber Deep and quiet Deep and quiet   Iris Round and dilated, No NVI Round and dilated, No NVI   Lens PC IOL in good position PC IOL in good position   Vitreous mild Vitreous syneresis mild Vitreous syneresis       Fundus Exam      Right Left   Disc trace Pallor, Sharp rim trace Pallor, Sharp rim   C/D Ratio 0.4 0.5   Macula Flat, Blunted foveal reflex, scattered Microaneurysms, focal cluster of MA and exudate superior macula, focal MA IN macula, associated patches of cystic changes Flat, Blunted foveal reflex, scattered MA, no edema   Vessels Vascular attenuation, mild Tortuousity, no NVE Vascular attenuation, mildTortuousity, no NVE   Periphery Attached, mild scattered MA/DBH  Attached, scattered MA             IMAGING AND PROCEDURES  Imaging and Procedures for '@TODAY'$ @  OCT, Retina - OU - Both  Eyes       Right Eye Quality was good. Central Foveal Thickness: 282. Progression has been stable. Findings include normal foveal contour, no SRF, intraretinal hyper-reflective material, intraretinal fluid (Persistent, mild IRF).   Left Eye Quality was good. Central Foveal Thickness: 269. Progression has been stable. Findings include normal foveal contour, no IRF, no SRF.   Notes *Images captured and stored on drive  Diagnosis / Impression:  OD: Mild, focal region of DME ST to macula -Persistent, mild IRF  OS: no DME  Clinical management:  See below  Abbreviations: NFP - Normal foveal profile. CME - cystoid macular edema. PED - pigment epithelial detachment. IRF - intraretinal fluid. SRF - subretinal fluid. EZ - ellipsoid zone. ERM - epiretinal membrane. ORA - outer retinal atrophy. ORT - outer retinal tubulation. SRHM - subretinal hyper-reflective material        Focal Laser - OD - Right Eye       LASER PROCEDURE NOTE  Diagnosis:   Diabetic macular edema, right eye  Procedure:  Focal laser photocoagulation using slit lamp laser, right eye  Anesthesia:  Topical  Surgeon: Bernarda Caffey, MD, PhD   Informed consent obtained, operative eye marked, and time out performed prior to initiation of laser.   [facility] [id number] Focal/Grid laser Power: 85 mW Duration: 40 msec  Spot size: 125 microns  # spots: 81 spots placed to parafoveal MAs  Complications: None.  RTC: 4-6 wks   Patient tolerated the procedure well and received  written and verbal post-procedure care information/education.                   ASSESSMENT/PLAN:    ICD-10-CM   1. Moderate nonproliferative diabetic retinopathy of right eye with macular edema associated with type 2 diabetes mellitus (HCC)  V78.5885 Focal Laser - OD - Right Eye  2. Moderate nonproliferative diabetic retinopathy of left eye without macular edema associated with type 2 diabetes mellitus ([place])  O27.7412   3. Retinal edema   H35.81 OCT, Retina - OU - Both Eyes  4. Essential hypertension  I10   5. Hypertensive retinopathy of both eyes  H35.033   6. Pseudophakia of both eyes  Z96.1     1-3. Moderate nonproliferative diabetic retinopathy w/ DME, OD  - OS without DME  - BCVA stable at 20/25 OD, 20/20 OS  - exam shows scattered MA/IRH OU; OD with focal, non-central edema in superior macula  - OCT confirms focal DME OD as described above, OS no DME  - FA (07.14.20) shows late leaking MA OU; OD with focal hyperfluorescent leakage in area of DME  - discussed findings, prognosis and treatment options including observation, focal laser, and intravitreal anti-VEGF therapy  - recommend focal laser OD today, 11.09.20  - pt wishes to proceed  - RBA of procedure discussed, questions answered  - informed consent obtained and signed  - see procedure note  - start PF qid OD x7 days  - f/u 4-6 weeks   4,5. Hypertensive retinopathy OU  - discussed importance of tight BP control  - monitor  6. Pseudophakia OU  - s/p CE/IOL OU (Dr. Albina Billet)  - beautiful surgeries, doing well  - monitor   Ophthalmic Meds Ordered this visit:  Meds ordered this encounter  Medications  . prednisoLONE acetate (PRED FORTE) 1 % ophthalmic suspension    Sig: Place 1 drop into the right eye 4 (four) times daily for 7 days.    Dispense:  10 mL    Refill:  0       Return for f/u 4-6 weeks, NPDR OD, DFE, OCT.  There are no Patient Instructions on file for this visit.   Explained the diagnoses, plan, and follow up with the patient and they expressed understanding.  Patient expressed understanding of the importance of proper follow up care.   This document serves as a record of services personally performed by Gardiner Sleeper, MD, PhD. It was created on their behalf by Leeann Must, [place], a certified ophthalmic assistant. The creation of this record is the provider's dictation and/or activities during the visit.    Electronically  signed by: Leeann Must, COA '@TODAY'$ @ 4:35 PM   Gardiner Sleeper, M.D., Ph.D. Diseases & Surgery of the Retina and Vitreous [facility]  I have reviewed the above documentation for accuracy and completeness, and I agree with the above. Gardiner Sleeper, M.D., Ph.D. 06/11/19 4:35 PM    Abbreviations: M myopia (nearsighted); A astigmatism; H hyperopia (farsighted); P presbyopia; Mrx spectacle prescription;  CTL contact lenses; OD right eye; OS left eye; OU both eyes  XT exotropia; ET esotropia; PEK punctate epithelial keratitis; PEE punctate epithelial erosions; DES dry eye syndrome; MGD meibomian gland dysfunction; ATs artificial tears; PFAT's preservative free artificial tears; [place] nuclear sclerotic cataract; PSC posterior subcapsular cataract; ERM epi-retinal membrane; PVD posterior vitreous detachment; RD retinal detachment; DM diabetes mellitus; DR diabetic retinopathy; NPDR non-proliferative diabetic retinopathy; PDR proliferative diabetic retinopathy; CSME clinically significant  macular edema; DME diabetic macular edema; dbh dot blot hemorrhages; CWS cotton wool spot; POAG primary open angle glaucoma; C/D cup-to-disc ratio; HVF humphrey visual field; GVF goldmann visual field; OCT optical coherence tomography; IOP intraocular pressure; BRVO Branch retinal vein occlusion; CRVO central retinal vein occlusion; CRAO central retinal artery occlusion; BRAO branch retinal artery occlusion; RT retinal tear; SB scleral buckle; PPV pars plana vitrectomy; VH Vitreous hemorrhage; PRP panretinal laser photocoagulation; IVK intravitreal kenalog; VMT vitreomacular traction; MH Macular hole;  NVD neovascularization of the disc; NVE neovascularization elsewhere; AREDS age related eye disease study; ARMD age related macular degeneration; POAG primary open angle glaucoma; EBMD epithelial/anterior basement membrane dystrophy; ACIOL anterior chamber intraocular lens; IOL intraocular lens; PCIOL  posterior chamber intraocular lens; Phaco/IOL phacoemulsification with intraocular lens placement; [place] photorefractive keratectomy; LASIK laser assisted in situ keratomileusis; HTN hypertension; DM diabetes mellitus; COPD chronic obstructive pulmonary disease

## 2019-06-11 ENCOUNTER — Ambulatory Visit (INDEPENDENT_AMBULATORY_CARE_PROVIDER_SITE_OTHER): Payer: Medicare Other | Admitting: Ophthalmology

## 2019-06-11 ENCOUNTER — Encounter (INDEPENDENT_AMBULATORY_CARE_PROVIDER_SITE_OTHER): Payer: Self-pay | Admitting: Ophthalmology

## 2019-06-11 ENCOUNTER — Other Ambulatory Visit: Payer: Self-pay

## 2019-06-11 DIAGNOSIS — H3581 Retinal edema: Secondary | ICD-10-CM

## 2019-06-11 DIAGNOSIS — E113392 Type 2 diabetes mellitus with moderate nonproliferative diabetic retinopathy without macular edema, left eye: Secondary | ICD-10-CM | POA: Diagnosis not present

## 2019-06-11 DIAGNOSIS — E113311 Type 2 diabetes mellitus with moderate nonproliferative diabetic retinopathy with macular edema, right eye: Secondary | ICD-10-CM | POA: Diagnosis not present

## 2019-06-11 DIAGNOSIS — H35033 Hypertensive retinopathy, bilateral: Secondary | ICD-10-CM

## 2019-06-11 DIAGNOSIS — I10 Essential (primary) hypertension: Secondary | ICD-10-CM

## 2019-06-11 DIAGNOSIS — Z961 Presence of intraocular lens: Secondary | ICD-10-CM

## 2019-06-11 MED ORDER — PREDNISOLONE ACETATE 1 % OP SUSP: 1.0000 [drp] | Freq: Four times a day (QID) | OPHTHALMIC | 0 refills | Status: AC

## 2019-07-06 NOTE — Progress Notes (Signed)
[facility] Note  07/10/2019     CHIEF COMPLAINT Patient presents for Retina Follow Up   HISTORY OF PRESENT ILLNESS: Rebecca Perkins is a 85 y.o. female who presents to the clinic today for:   HPI    Retina Follow Up    Patient presents with  Diabetic Retinopathy.  In both eyes.  Severity is moderate.  Duration of 4 weeks.  Since onset it is stable.  I, the attending physician,  performed the HPI with the patient and updated documentation appropriately.          Comments    Patient states vision the same OU. BS just over 100 this am. Last a1c was 7.8 about a month ago.        Last edited by Bernarda Caffey, MD on 07/10/2019  1:14 PM. (History)    pt states she has no problems after the laser procedure at last visit    Referring physician: Luberta Mutter, MD [place],  [place] [address]  HISTORICAL INFORMATION:   Selected notes from the MEDICAL RECORD NUMBER Referred by Dr. Luberta Mutter for concern of DME   CURRENT MEDICATIONS: Current Outpatient Medications (Ophthalmic Drugs)  Medication Sig  . polyvinyl alcohol (LIQUIFILM TEARS) 1.4 % ophthalmic solution 1 drop as needed for dry eyes.   No current facility-administered medications for this visit. (Ophthalmic Drugs)   Current Outpatient Medications (Other)  Medication Sig  . Blood Glucose Monitoring Suppl (ONE TOUCH ULTRA SYSTEM KIT) W/DEVICE KIT 1 kit by Does not apply route once. Tests Blood sugar before meals and at bedtime  . Calcium Carbonate-Vitamin D (CALTRATE 600+D PO) Take by mouth 2 (two) times daily.  . clopidogrel (PLAVIX) 75 MG tablet TAKE 1 TABLET BY MOUTH DAILY  . Coenzyme Q10 (COQ10 PO) Take by mouth.  . Continuous Blood Gluc Sensor (FREESTYLE LIBRE 14 DAY SENSOR) MISC USE EVERY 14 DAYS  . denosumab (PROLIA) 60 MG/ML SOSY injection Inject into the skin every 6 (six) months.   . donepezil (ARICEPT) 5 MG tablet Take 1 tablet (5 mg total) by mouth at  bedtime.  . hydrochlorothiazide (MICROZIDE) 12.5 MG capsule TAKE 1 CAPSULE(12.5 MG) BY MOUTH DAILY  . Insulin Glargine, 1 Unit Dial, (TOUJEO SOLOSTAR) 300 UNIT/ML SOPN Inject 31 Units into the skin. Inject 31 units under the skin once daily.  . insulin lispro (HUMALOG KWIKPEN) 100 UNIT/ML KwikPen Inject into the skin. Use only if blood sugar is over 200 in the am.  . Insulin Pen Needle (B-D ULTRAFINE III SHORT PEN) 31G X 8 MM MISC INJECT AS DIRECTED 4 TIMES DAILY  . INSULIN SYRINGE 1CC/29G 29G X 1/2" 1 ML MISC AS DIRECTED.  Marland Kitchen losartan (COZAAR) 100 MG tablet TAKE 1 TABLET BY MOUTH EVERY DAY  . metoprolol succinate (TOPROL-XL) 50 MG 24 hr tablet Take 1 tablet (50 mg total) by mouth daily. Take with or immediately following a meal.  . Multiple Vitamin (MULTIVITAMIN) tablet Take 1 tablet by mouth daily.    . NONFORMULARY OR COMPOUNDED ITEM [facility]:  Peripheral Neuropathy Cream - Bupivacaine 1%, Doxepin 3%, Gabapentin 6%, Pentoxifylline 3%, Topiramate 1%, apply 1-2 grams to affected ares 3-4 times a day prn.  . omeprazole (PRILOSEC) 20 MG capsule TAKE 1 CAPSULE BY MOUTH DAILY  . ONE TOUCH ULTRA TEST test strip USE FOUR TIMES DAILY BEFORE MEALS AND EVERY NIGHT AT BEDTIME  . Pyridoxine HCl (VITAMIN B-6) 100 MG tablet Take 100 mg by mouth  daily.    . rosuvastatin (CRESTOR) 20 MG tablet TAKE 1 TABLET(20 MG) BY MOUTH DAILY  . TRULICITY 1.5 TM/2.2QJ SOPN ADMINISTER 1.5 MG(0.5ML) UNDER THE SKIN 1 TIME EVERY WEEK  . VASCEPA 1 g CAPS TAKE 2 CAPSULES BY MOUTH TWICE DAILY  . fenofibrate 160 MG tablet Take 1 tablet (160 mg total) by mouth daily. (Patient not taking: Reported on 06/04/2019)   No current facility-administered medications for this visit. (Other)      REVIEW OF SYSTEMS: ROS    Positive for: Gastrointestinal, Neurological, Genitourinary, Musculoskeletal, HENT, Endocrine, Eyes   Negative for: Constitutional, Skin, Cardiovascular, Respiratory, Psychiatric, Allergic/Imm, Heme/Lymph    Last edited by Roselee Nova D, COT on 07/10/2019 12:47 PM. (History)       ALLERGIES Allergies  Allergen Reactions  . Lipitor [Atorvastatin Calcium] Diarrhea  . Niacin And Related Hives  . Statins Diarrhea  . Macrobid [Nitrofurantoin Macrocrystal] Other (See Comments)    unknown    PAST MEDICAL HISTORY Past Medical History:  Diagnosis Date  . Anemia   . Diabetes mellitus type 2, insulin dependent ([place])   . Diabetic retinopathy ([place])    NPDR OU  . Dyslipidemia   . GERD (gastroesophageal reflux disease)   . Hypertension   . Hypertensive retinopathy    OU  . Migraines   . Osteoporosis   . PVD (peripheral vascular disease) ([place])    abi .83 (L), .92 (R)  . RSD (reflex sympathetic dystrophy) 2007   R wrist/hand following fx  . Stroke ([place])   . TIA (transient ischemic attack)   . Varicose veins    Past Surgical History:  Procedure Laterality Date  . ABDOMINAL HYSTERECTOMY  1988  . APPENDECTOMY  1966  . BREAST CYST EXCISION    . CATARACT EXTRACTION Bilateral 10/2009  . CHOLECYSTECTOMY  1989  . EYE SURGERY Bilateral    Cat Sx OU  . Several benign cyst removed     last 1 in 1972  . TUBAL LIGATION    . UMBILICAL HERNIA REPAIR      FAMILY HISTORY Family History  Problem Relation Age of Onset  . Stroke Mother 51  . Hypertension Mother   . Clotting disorder Father   . Heart attack Father   . Arrhythmia Sister   . Stroke Brother   . Thyroid disease Neg Hx     SOCIAL HISTORY Social History   Tobacco Use  . Smoking status: Never Smoker  . Smokeless tobacco: Never Used  Substance Use Topics  . Alcohol use: No  . Drug use: No         OPHTHALMIC EXAM:  Base Eye Exam    Visual Acuity (Snellen - Linear)      Right Left   Dist [place] 20/25 -2 20/25   Dist ph [place] NI 20/25 +2       Tonometry (Tonopen, 12:58 PM)      Right Left   Pressure 16 12       Pupils      Dark Light Shape React APD   Right 3 2 Round Brisk None   Left 3 2 Round Brisk None        Visual Fields (Counting fingers)      Left Right    Full Full       Extraocular Movement      Right Left    Full, Ortho Full, Ortho       Neuro/Psych    Oriented x3: Yes   Mood/Affect:  Normal       Dilation    Both eyes: 1.0% Mydriacyl, 2.5% Phenylephrine @ 12:58 PM        Slit Lamp and Fundus Exam    Slit Lamp Exam      Right Left   Lids/Lashes Meibomian gland dysfunction, Dermatochalasis - upper lid Meibomian gland dysfunction, Dermatochalasis - upper lid   Conjunctiva/Sclera White and quiet White and quiet   Cornea Mild Debris in tear film, 2-3+ Punctate epithelial erosions, Arcus irregular tear film and epithelium, 3+ Punctate epithelial erosions, Arcus   Anterior Chamber Deep and quiet Deep and quiet   Iris Round and dilated, No NVI Round and dilated, No NVI   Lens PC IOL in good position PC IOL in good position   Vitreous mild Vitreous syneresis mild Vitreous syneresis       Fundus Exam      Right Left   Disc trace Pallor, Sharp rim trace Pallor, Sharp rim   C/D Ratio 0.4 0.5   Macula Flat, Blunted foveal reflex, scattered Microaneurysms, focal cluster of MA and exudate superior macula, focal MA IN macula, associated patches of cystic changes, light focal laser scars ST macula Flat, good foveal reflex, scattered MA, no edema   Vessels Vascular attenuation, mild Tortuousity, no NVE Vascular attenuation, mild Tortuousity, no NVE   Periphery Attached, mild scattered MA/DBH  Attached, scattered MA             IMAGING AND PROCEDURES  Imaging and Procedures for _0 @  OCT, Retina - OU - Both Eyes       Right Eye Quality was good. Central Foveal Thickness: 282. Progression has been stable. Findings include normal foveal contour, no SRF, intraretinal hyper-reflective material, intraretinal fluid (Mild interval increase in cystic changes inferior macula, Persistent cystic changes superior macula).   Left Eye Quality was good. Central Foveal Thickness: 272.  Progression has been stable. Findings include normal foveal contour, no IRF, no SRF.   Notes *Images captured and stored on drive  Diagnosis / Impression:  OD: Mild interval increase in cystic changes inferior macula, Persistent cystic changes superior macula  OS: no DME  Clinical management:  See below  Abbreviations: NFP - Normal foveal profile. CME - cystoid macular edema. PED - pigment epithelial detachment. IRF - intraretinal fluid. SRF - subretinal fluid. EZ - ellipsoid zone. ERM - epiretinal membrane. ORA - outer retinal atrophy. ORT - outer retinal tubulation. SRHM - subretinal hyper-reflective material                 ASSESSMENT/PLAN:    ICD-10-CM   1. Moderate nonproliferative diabetic retinopathy of right eye with macular edema associated with type 2 diabetes mellitus ([place])  Z00.1749   2. Moderate nonproliferative diabetic retinopathy of left eye without macular edema associated with type 2 diabetes mellitus ([place])  S49.6759   3. Retinal edema  H35.81 OCT, Retina - OU - Both Eyes  4. Essential hypertension  I10   5. Hypertensive retinopathy of both eyes  H35.033   6. Pseudophakia of both eyes  Z96.1     1-3. Moderate nonproliferative diabetic retinopathy w/ DME, OD  - OS without DME  - FA (07.14.20) shows late leaking MA OU; OD with focal hyperfluorescent leakage in area of DME  - s/p focal laser OD (11.09.20)  - BCVA stable at 20/25 OD, 20/20 OS  - exam shows scattered MA/IRH OU; OD with focal, non-central edema in superior macula  - OCT shows noncentral cystic changes / DME--inf  and sup macula OD, OS no DME  - f/u 6 weeks   4,5. Hypertensive retinopathy OU  - discussed importance of tight BP control  - monitor  6. Pseudophakia OU  - s/p CE/IOL OU (Dr. Albina Billet)  - beautiful surgeries, doing well  - monitor   Ophthalmic Meds Ordered this visit:  No orders of the defined types were placed in this encounter.      Return in about 6 weeks (around  08/21/2019) for f/u 6 weeks NPDR OD, DFE, OCT.  There are no Patient Instructions on file for this visit.   Explained the diagnoses, plan, and follow up with the patient and they expressed understanding.  Patient expressed understanding of the importance of proper follow up care.   This document serves as a record of services personally performed by Gardiner Sleeper, MD, PhD. It was created on their behalf by Estill Bakes, COT an ophthalmic technician. The creation of this record is the provider's dictation and/or activities during the visit.    Electronically signed by: Estill Bakes, COT 07/06/19 @ 1:16 AM  This document serves as a record of services personally performed by Gardiner Sleeper, MD, PhD. It was created on their behalf by Ernest Mallick, OA, an ophthalmic assistant. The creation of this record is the provider's dictation and/or activities during the visit.    Electronically signed by: Ernest Mallick, OA 12.08.2020 1:16 AM  Gardiner Sleeper, M.D., Ph.D. Diseases & Surgery of the Retina and Vitreous [facility]   I have reviewed the above documentation for accuracy and completeness, and I agree with the above. Gardiner Sleeper, M.D., Ph.D. 07/14/19 1:16 AM   Abbreviations: M myopia (nearsighted); A astigmatism; H hyperopia (farsighted); P presbyopia; Mrx spectacle prescription;  CTL contact lenses; OD right eye; OS left eye; OU both eyes  XT exotropia; ET esotropia; PEK punctate epithelial keratitis; PEE punctate epithelial erosions; DES dry eye syndrome; MGD meibomian gland dysfunction; ATs artificial tears; PFAT's preservative free artificial tears; [place] nuclear sclerotic cataract; PSC posterior subcapsular cataract; ERM epi-retinal membrane; PVD posterior vitreous detachment; RD retinal detachment; DM diabetes mellitus; DR diabetic retinopathy; NPDR non-proliferative diabetic retinopathy; PDR proliferative diabetic retinopathy; CSME clinically significant macular  edema; DME diabetic macular edema; dbh dot blot hemorrhages; CWS cotton wool spot; POAG primary open angle glaucoma; C/D cup-to-disc ratio; HVF humphrey visual field; GVF goldmann visual field; OCT optical coherence tomography; IOP intraocular pressure; BRVO Branch retinal vein occlusion; CRVO central retinal vein occlusion; CRAO central retinal artery occlusion; BRAO branch retinal artery occlusion; RT retinal tear; SB scleral buckle; PPV pars plana vitrectomy; VH Vitreous hemorrhage; PRP panretinal laser photocoagulation; IVK intravitreal kenalog; VMT vitreomacular traction; MH Macular hole;  NVD neovascularization of the disc; NVE neovascularization elsewhere; AREDS age related eye disease study; ARMD age related macular degeneration; POAG primary open angle glaucoma; EBMD epithelial/anterior basement membrane dystrophy; ACIOL anterior chamber intraocular lens; IOL intraocular lens; PCIOL posterior chamber intraocular lens; Phaco/IOL phacoemulsification with intraocular lens placement; [place] photorefractive keratectomy; LASIK laser assisted in situ keratomileusis; HTN hypertension; DM diabetes mellitus; COPD chronic obstructive pulmonary disease

## 2019-07-07 ENCOUNTER — Other Ambulatory Visit: Payer: Self-pay | Admitting: Endocrinology

## 2019-07-10 ENCOUNTER — Ambulatory Visit (INDEPENDENT_AMBULATORY_CARE_PROVIDER_SITE_OTHER): Payer: Medicare Other | Admitting: Ophthalmology

## 2019-07-10 ENCOUNTER — Other Ambulatory Visit: Payer: Self-pay

## 2019-07-10 ENCOUNTER — Encounter (INDEPENDENT_AMBULATORY_CARE_PROVIDER_SITE_OTHER): Payer: Self-pay | Admitting: Ophthalmology

## 2019-07-10 DIAGNOSIS — H35033 Hypertensive retinopathy, bilateral: Secondary | ICD-10-CM

## 2019-07-10 DIAGNOSIS — Z961 Presence of intraocular lens: Secondary | ICD-10-CM

## 2019-07-10 DIAGNOSIS — H3581 Retinal edema: Secondary | ICD-10-CM

## 2019-07-10 DIAGNOSIS — I10 Essential (primary) hypertension: Secondary | ICD-10-CM

## 2019-07-10 DIAGNOSIS — E113392 Type 2 diabetes mellitus with moderate nonproliferative diabetic retinopathy without macular edema, left eye: Secondary | ICD-10-CM

## 2019-07-10 DIAGNOSIS — E113311 Type 2 diabetes mellitus with moderate nonproliferative diabetic retinopathy with macular edema, right eye: Secondary | ICD-10-CM

## 2019-07-14 ENCOUNTER — Encounter (INDEPENDENT_AMBULATORY_CARE_PROVIDER_SITE_OTHER): Payer: Self-pay | Admitting: Ophthalmology

## 2019-08-20 ENCOUNTER — Telehealth: Payer: Self-pay

## 2019-08-20 NOTE — Progress Notes (Signed)
[facility] Note  08/21/2019     CHIEF COMPLAINT Patient presents for Retina Follow Up   HISTORY OF PRESENT ILLNESS: Rebecca Perkins is a 86 y.o. female who presents to the clinic today for:   HPI    Retina Follow Up    Patient presents with  Diabetic Retinopathy.  In right eye.  This started 3 months ago.  Severity is mild.  Since onset it is stable.  I, the attending physician,  performed the HPI with the patient and updated documentation appropriately.          Comments    F/u NPDR OD Patient states her vision "has been good, denies new visual onsets. BS WINL per patient        Last edited by Bernarda Caffey, MD on 08/26/2019  1:41 AM. (History)    pt states she has no problems after the laser procedure at last visit    Referring physician: [name], MD [phone] [facility] [place],  [place] [address]  HISTORICAL INFORMATION:   Selected notes from the MEDICAL RECORD NUMBER Referred by Dr. Luberta Mutter for concern of DME   CURRENT MEDICATIONS: Current Outpatient Medications (Ophthalmic Drugs)  Medication Sig  . polyvinyl alcohol (LIQUIFILM TEARS) 1.4 % ophthalmic solution 1 drop as needed for dry eyes.   No current facility-administered medications for this visit. (Ophthalmic Drugs)   Current Outpatient Medications (Other)  Medication Sig  . Blood Glucose Monitoring Suppl (ONE TOUCH ULTRA SYSTEM KIT) W/DEVICE KIT 1 kit by Does not apply route once. Tests Blood sugar before meals and at bedtime  . Calcium Carbonate-Vitamin D (CALTRATE 600+D PO) Take by mouth 2 (two) times daily.  . clopidogrel (PLAVIX) 75 MG tablet TAKE 1 TABLET BY MOUTH DAILY  . Coenzyme Q10 (COQ10 PO) Take by mouth.  . Continuous Blood Gluc Sensor (FREESTYLE LIBRE 14 DAY SENSOR) MISC USE EVERY 14 DAYS  . denosumab (PROLIA) 60 MG/ML SOSY injection Inject into the skin every 6 (six) months.   . donepezil (ARICEPT) 5 MG tablet Take 1 tablet (5 mg total) by  mouth at bedtime.  . fenofibrate 160 MG tablet Take 1 tablet (160 mg total) by mouth daily.  . hydrochlorothiazide (MICROZIDE) 12.5 MG capsule TAKE 1 CAPSULE(12.5 MG) BY MOUTH DAILY  . Insulin Glargine, 1 Unit Dial, (TOUJEO SOLOSTAR) 300 UNIT/ML SOPN Inject 31 Units into the skin. Inject 31 units under the skin once daily.  . insulin lispro (HUMALOG KWIKPEN) 100 UNIT/ML KwikPen Inject into the skin. Use only if blood sugar is over 200 in the am.  . Insulin Pen Needle (B-D ULTRAFINE III SHORT PEN) 31G X 8 MM MISC INJECT AS DIRECTED 4 TIMES DAILY  . INSULIN SYRINGE 1CC/29G 29G X 1/2" 1 ML MISC AS DIRECTED.  Marland Kitchen losartan (COZAAR) 100 MG tablet TAKE 1 TABLET BY MOUTH EVERY DAY  . metoprolol succinate (TOPROL-XL) 50 MG 24 hr tablet Take 1 tablet (50 mg total) by mouth daily. Take with or immediately following a meal.  . Multiple Vitamin (MULTIVITAMIN) tablet Take 1 tablet by mouth daily.    . NONFORMULARY OR COMPOUNDED ITEM [facility]:  Peripheral Neuropathy Cream - Bupivacaine 1%, Doxepin 3%, Gabapentin 6%, Pentoxifylline 3%, Topiramate 1%, apply 1-2 grams to affected ares 3-4 times a day prn.  . omeprazole (PRILOSEC) 20 MG capsule TAKE 1 CAPSULE BY MOUTH DAILY  . ONE TOUCH ULTRA TEST test strip USE FOUR TIMES DAILY BEFORE MEALS AND EVERY  NIGHT AT BEDTIME  . Pyridoxine HCl (VITAMIN B-6) 100 MG tablet Take 100 mg by mouth daily.    . rosuvastatin (CRESTOR) 20 MG tablet TAKE 1 TABLET(20 MG) BY MOUTH DAILY  . TRULICITY 1.5 GQ/6.7YP SOPN ADMINISTER 1.5 MG(0.5ML) UNDER THE SKIN 1 TIME EVERY WEEK  . VASCEPA 1 g CAPS TAKE 2 CAPSULES BY MOUTH TWICE DAILY   No current facility-administered medications for this visit. (Other)      REVIEW OF SYSTEMS: ROS    Positive for: Endocrine, Eyes   Negative for: Constitutional, Gastrointestinal, Neurological, Skin, Genitourinary, Musculoskeletal, HENT, Cardiovascular, Respiratory, Psychiatric, Allergic/Imm, Heme/Lymph   Last edited by Zenovia Jordan, LPN  on 9/50/9326  7:12 PM. (History)       ALLERGIES Allergies  Allergen Reactions  . Lipitor [Atorvastatin Calcium] Diarrhea  . Niacin And Related Hives  . Statins Diarrhea  . Macrobid [Nitrofurantoin Macrocrystal] Other (See Comments)    unknown    PAST MEDICAL HISTORY Past Medical History:  Diagnosis Date  . Anemia   . Diabetes mellitus type 2, insulin dependent ([place])   . Diabetic retinopathy ([place])    NPDR OU  . Dyslipidemia   . GERD (gastroesophageal reflux disease)   . Hypertension   . Hypertensive retinopathy    OU  . Migraines   . Osteoporosis   . PVD (peripheral vascular disease) ([place])    abi .83 (L), .92 (R)  . RSD (reflex sympathetic dystrophy) 2007   R wrist/hand following fx  . Stroke ([place])   . TIA (transient ischemic attack)   . Varicose veins    Past Surgical History:  Procedure Laterality Date  . ABDOMINAL HYSTERECTOMY  1988  . APPENDECTOMY  1966  . BREAST CYST EXCISION    . CATARACT EXTRACTION Bilateral 10/2009  . CHOLECYSTECTOMY  1989  . EYE SURGERY Bilateral    Cat Sx OU  . Several benign cyst removed     last 1 in 1972  . TUBAL LIGATION    . UMBILICAL HERNIA REPAIR      FAMILY HISTORY Family History  Problem Relation Age of Onset  . Stroke Mother 23  . Hypertension Mother   . Clotting disorder Father   . Heart attack Father   . Arrhythmia Sister   . Stroke Brother   . Thyroid disease Neg Hx     SOCIAL HISTORY Social History   Tobacco Use  . Smoking status: Never Smoker  . Smokeless tobacco: Never Used  Substance Use Topics  . Alcohol use: No  . Drug use: No         OPHTHALMIC EXAM:  Base Eye Exam    Visual Acuity (Snellen - Linear)      Right Left   Dist [place] 20/40 +2 20/25 +2   Dist ph [place] 20/30 -1 NI       Tonometry (Tonopen, 1:27 PM)      Right Left   Pressure 15 17       Pupils      Dark Light Shape React APD   Right 3 2 Round Brisk None   Left 3 2 Round Brisk None       Visual Fields (Counting fingers)       Left Right    Full Full       Extraocular Movement      Right Left    Full, Ortho Full, Ortho       Neuro/Psych    Oriented x3: Yes   Mood/Affect: Normal  Dilation    Both eyes: 1.0% Mydriacyl, 2.5% Phenylephrine @ 1:25 PM        Slit Lamp and Fundus Exam    Slit Lamp Exam      Right Left   Lids/Lashes Meibomian gland dysfunction, Dermatochalasis - upper lid Meibomian gland dysfunction, Dermatochalasis - upper lid   Conjunctiva/Sclera White and quiet White and quiet   Cornea Mild Debris in tear film, 1-2+ Punctate epithelial erosions, Arcus irregular tear film and epithelium, 2-3+ Punctate epithelial erosions, Arcus   Anterior Chamber Deep and quiet Deep and quiet   Iris Round and dilated, No NVI Round and dilated, No NVI   Lens PCIOL in good position with open PC PCIOL in good position with open PC   Vitreous mild Vitreous syneresis mild Vitreous syneresis       Fundus Exam      Right Left   Disc trace Pallor, Sharp rim trace Pallor, Sharp rim   C/D Ratio 0.4 0.6   Macula Flat, Blunted foveal reflex, scattered Microaneurysms, focal cluster of MA and exudate superior macula, focal MA IN macula, associated patches of cystic changes--improved from prior, light focal laser scars ST macula Flat, good foveal reflex, scattered MA, no edema   Vessels Vascular attenuation, mild Tortuousity, no NVE Vascular attenuation, mild Tortuousity, no NVE   Periphery Attached, mild scattered MA/DBH  Attached, scattered MA             IMAGING AND PROCEDURES  Imaging and Procedures for '@TODAY'$ @  OCT, Retina - OU - Both Eyes       Right Eye Quality was good. Central Foveal Thickness: 281. Progression has improved. Findings include normal foveal contour, no SRF, intraretinal hyper-reflective material, intraretinal fluid (Mild decrease in cystic changes).   Left Eye Quality was good. Central Foveal Thickness: 274. Progression has been stable. Findings include normal foveal  contour, no IRF, no SRF.   Notes *Images captured and stored on drive  Diagnosis / Impression:  OD: Mild decrease in cystic changes OS: no DME  Clinical management:  See below  Abbreviations: NFP - Normal foveal profile. CME - cystoid macular edema. PED - pigment epithelial detachment. IRF - intraretinal fluid. SRF - subretinal fluid. EZ - ellipsoid zone. ERM - epiretinal membrane. ORA - outer retinal atrophy. ORT - outer retinal tubulation. SRHM - subretinal hyper-reflective material                 ASSESSMENT/PLAN:    ICD-10-CM   1. Moderate nonproliferative diabetic retinopathy of right eye with macular edema associated with type 2 diabetes mellitus ([place])  E93.8101   2. Moderate nonproliferative diabetic retinopathy of left eye without macular edema associated with type 2 diabetes mellitus ([place])  B51.0258   3. Retinal edema  H35.81 OCT, Retina - OU - Both Eyes  4. Essential hypertension  I10   5. Hypertensive retinopathy of both eyes  H35.033   6. Pseudophakia of both eyes  Z96.1     1-3. Moderate nonproliferative diabetic retinopathy w/ DME, OD  - OS without DME  - FA (07.14.20) shows late leaking MA OU; OD with focal hyperfluorescent leakage in area of DME  - s/p focal laser OD (11.09.20)  - BCVA stable at 20/25 OD, 20/20 OS  - exam shows scattered MA/IRH OU             - exam on 08/21/2019 shows mild decrease in cystic changes OD  - OCT shows noncentral cystic changes / DME--inf and sup macula OD  slightly improved post focal laser, OS no DME  - f/u 3 months, DFE, OCT   4,5. Hypertensive retinopathy OU  - discussed importance of tight BP control  - monitor  6. Pseudophakia OU  - s/p CE/IOL OU (Dr. Albina Billet)  - beautiful surgeries, doing well  - monitor   Ophthalmic Meds Ordered this visit:  No orders of the defined types were placed in this encounter.      Return in about 3 months (around 11/19/2019) for Dilated exam,OCT.  There are no Patient  Instructions on file for this visit.   Explained the diagnoses, plan, and follow up with the patient and they expressed understanding.  Patient expressed understanding of the importance of proper follow up care.   This document serves as a record of services personally performed by Gardiner Sleeper, MD, PhD. It was created on their behalf by Estill Bakes, COT an ophthalmic technician. The creation of this record is the provider's dictation and/or activities during the visit.    Electronically signed by: Estill Bakes, COT 08/20/19 @ 1:44 AM  Gardiner Sleeper, M.D., Ph.D. Diseases & Surgery of the Retina and [place] 08/21/2019   I have reviewed the above documentation for accuracy and completeness, and I agree with the above. Gardiner Sleeper, M.D., Ph.D. 08/26/19 1:44 AM   Abbreviations: M myopia (nearsighted); A astigmatism; H hyperopia (farsighted); P presbyopia; Mrx spectacle prescription;  CTL contact lenses; OD right eye; OS left eye; OU both eyes  XT exotropia; ET esotropia; PEK punctate epithelial keratitis; PEE punctate epithelial erosions; DES dry eye syndrome; MGD meibomian gland dysfunction; ATs artificial tears; PFAT's preservative free artificial tears; [place] nuclear sclerotic cataract; PSC posterior subcapsular cataract; ERM epi-retinal membrane; PVD posterior vitreous detachment; RD retinal detachment; DM diabetes mellitus; DR diabetic retinopathy; NPDR non-proliferative diabetic retinopathy; PDR proliferative diabetic retinopathy; CSME clinically significant macular edema; DME diabetic macular edema; dbh dot blot hemorrhages; CWS cotton wool spot; POAG primary open angle glaucoma; C/D cup-to-disc ratio; HVF humphrey visual field; GVF goldmann visual field; OCT optical coherence tomography; IOP intraocular pressure; BRVO Branch retinal vein occlusion; CRVO central retinal vein occlusion; CRAO central retinal artery occlusion; BRAO branch retinal artery  occlusion; RT retinal tear; SB scleral buckle; PPV pars plana vitrectomy; VH Vitreous hemorrhage; PRP panretinal laser photocoagulation; IVK intravitreal kenalog; VMT vitreomacular traction; MH Macular hole;  NVD neovascularization of the disc; NVE neovascularization elsewhere; AREDS age related eye disease study; ARMD age related macular degeneration; POAG primary open angle glaucoma; EBMD epithelial/anterior basement membrane dystrophy; ACIOL anterior chamber intraocular lens; IOL intraocular lens; PCIOL posterior chamber intraocular lens; Phaco/IOL phacoemulsification with intraocular lens placement; [place] photorefractive keratectomy; LASIK laser assisted in situ keratomileusis; HTN hypertension; DM diabetes mellitus; COPD chronic obstructive pulmonary disease

## 2019-08-20 NOTE — Telephone Encounter (Signed)
Noted  

## 2019-08-20 NOTE — Telephone Encounter (Signed)
Patients son called in stating that her PCP will be taking over her care from here on out and will no longer be a patient here. Wanted to make sure the PCP would be able to fill her Rx from here on out      Please advise

## 2019-08-21 ENCOUNTER — Other Ambulatory Visit: Payer: Self-pay | Admitting: Family Medicine

## 2019-08-21 ENCOUNTER — Telehealth: Payer: Self-pay | Admitting: Endocrinology

## 2019-08-21 ENCOUNTER — Ambulatory Visit (INDEPENDENT_AMBULATORY_CARE_PROVIDER_SITE_OTHER): Payer: Medicare Other | Admitting: Ophthalmology

## 2019-08-21 ENCOUNTER — Encounter (INDEPENDENT_AMBULATORY_CARE_PROVIDER_SITE_OTHER): Payer: Self-pay | Admitting: Ophthalmology

## 2019-08-21 DIAGNOSIS — I10 Essential (primary) hypertension: Secondary | ICD-10-CM | POA: Diagnosis not present

## 2019-08-21 DIAGNOSIS — Z961 Presence of intraocular lens: Secondary | ICD-10-CM

## 2019-08-21 DIAGNOSIS — E113311 Type 2 diabetes mellitus with moderate nonproliferative diabetic retinopathy with macular edema, right eye: Secondary | ICD-10-CM | POA: Diagnosis not present

## 2019-08-21 DIAGNOSIS — H3581 Retinal edema: Secondary | ICD-10-CM | POA: Diagnosis not present

## 2019-08-21 DIAGNOSIS — E113392 Type 2 diabetes mellitus with moderate nonproliferative diabetic retinopathy without macular edema, left eye: Secondary | ICD-10-CM | POA: Diagnosis not present

## 2019-08-21 DIAGNOSIS — H35033 Hypertensive retinopathy, bilateral: Secondary | ICD-10-CM

## 2019-08-21 MED ORDER — BD PEN NEEDLE SHORT U/F 31G X 8 MM MISC: 2 refills | Status: AC

## 2019-08-23 ENCOUNTER — Other Ambulatory Visit: Payer: Medicare PPO

## 2019-08-23 ENCOUNTER — Other Ambulatory Visit: Payer: Self-pay

## 2019-08-23 ENCOUNTER — Ambulatory Visit: Payer: Medicare Other | Admitting: Endocrinology

## 2019-08-23 DIAGNOSIS — I10 Essential (primary) hypertension: Secondary | ICD-10-CM | POA: Diagnosis not present

## 2019-08-23 DIAGNOSIS — E119 Type 2 diabetes mellitus without complications: Secondary | ICD-10-CM | POA: Diagnosis not present

## 2019-08-23 DIAGNOSIS — E781 Pure hyperglyceridemia: Secondary | ICD-10-CM

## 2019-08-23 DIAGNOSIS — Z794 Long term (current) use of insulin: Secondary | ICD-10-CM | POA: Diagnosis not present

## 2019-08-24 ENCOUNTER — Encounter: Payer: Self-pay | Admitting: Podiatry

## 2019-08-24 ENCOUNTER — Ambulatory Visit (INDEPENDENT_AMBULATORY_CARE_PROVIDER_SITE_OTHER): Payer: Medicare PPO | Admitting: Podiatry

## 2019-08-24 DIAGNOSIS — M79675 Pain in left toe(s): Secondary | ICD-10-CM | POA: Diagnosis not present

## 2019-08-24 DIAGNOSIS — E1142 Type 2 diabetes mellitus with diabetic polyneuropathy: Secondary | ICD-10-CM | POA: Diagnosis not present

## 2019-08-24 DIAGNOSIS — B351 Tinea unguium: Secondary | ICD-10-CM

## 2019-08-24 DIAGNOSIS — L84 Corns and callosities: Secondary | ICD-10-CM | POA: Diagnosis not present

## 2019-08-24 DIAGNOSIS — M79674 Pain in right toe(s): Secondary | ICD-10-CM

## 2019-08-24 LAB — COMPREHENSIVE METABOLIC PANEL
AG Ratio: 1.4 (calc) (ref 1.0–2.5)
ALT: 22 U/L (ref 6–29)
AST: 23 U/L (ref 10–35)
Albumin: 3.8 g/dL (ref 3.6–5.1)
Alkaline phosphatase (APISO): 42 U/L (ref 37–153)
BUN/Creatinine Ratio: 20 (calc) (ref 6–22)
BUN: 20 mg/dL (ref 7–25)
CO2: 30 mmol/L (ref 20–32)
Calcium: 9.3 mg/dL (ref 8.6–10.4)
Chloride: 106 mmol/L (ref 98–110)
Creat: 1 mg/dL — ABNORMAL HIGH (ref 0.60–0.88)
Globulin: 2.7 g/dL (calc) (ref 1.9–3.7)
Glucose, Bld: 135 mg/dL — ABNORMAL HIGH (ref 65–99)
Potassium: 3.8 mmol/L (ref 3.5–5.3)
Sodium: 143 mmol/L (ref 135–146)
Total Bilirubin: 0.4 mg/dL (ref 0.2–1.2)
Total Protein: 6.5 g/dL (ref 6.1–8.1)

## 2019-08-24 LAB — HEMOGLOBIN A1C
Hgb A1c MFr Bld: 8.5 % of total Hgb — ABNORMAL HIGH (ref <5.7)
Mean Plasma Glucose: 197 (calc)
eAG (mmol/L): 10.9 (calc)

## 2019-08-24 LAB — LIPID PANEL
Cholesterol: 156 mg/dL (ref <200)
HDL: 33 mg/dL — ABNORMAL LOW (ref >=50)
Non-HDL Cholesterol (Calc): 123 mg/dL (calc) (ref <130)
Total CHOL/HDL Ratio: 4.7 (calc) (ref <5.0)
Triglycerides: 489 mg/dL — ABNORMAL HIGH (ref <150)

## 2019-08-24 NOTE — Patient Instructions (Signed)
Diabetes Mellitus and Foot Care Foot care is an important part of your health, especially when you have diabetes. Diabetes may cause you to have problems because of poor blood flow (circulation) to your feet and legs, which can cause your skin to:  Become thinner and drier.  Break more easily.  Heal more slowly.  Peel and crack. You may also have nerve damage (neuropathy) in your legs and feet, causing decreased feeling in them. This means that you may not notice minor injuries to your feet that could lead to more serious problems. Noticing and addressing any potential problems early is the best way to prevent future foot problems. How to care for your feet Foot hygiene  Wash your feet daily with warm water and mild soap. Do not use hot water. Then, pat your feet and the areas between your toes until they are completely dry. Do not soak your feet as this can dry your skin.  Trim your toenails straight across. Do not dig under them or around the cuticle. File the edges of your nails with an emery board or nail file.  Apply a moisturizing lotion or petroleum jelly to the skin on your feet and to dry, brittle toenails. Use lotion that does not contain alcohol and is unscented. Do not apply lotion between your toes. Shoes and socks  Wear clean socks or stockings every day. Make sure they are not too tight. Do not wear knee-high stockings since they may decrease blood flow to your legs.  Wear shoes that fit properly and have enough cushioning. Always look in your shoes before you put them on to be sure there are no objects inside.  To break in new shoes, wear them for just a few hours a day. This prevents injuries on your feet. Wounds, scrapes, corns, and calluses  Check your feet daily for blisters, cuts, bruises, sores, and redness. If you cannot see the bottom of your feet, use a mirror or ask someone for help.  Do not cut corns or calluses or try to remove them with medicine.  If you  find a minor scrape, cut, or break in the skin on your feet, keep it and the skin around it clean and dry. You may clean these areas with mild soap and water. Do not clean the area with peroxide, alcohol, or iodine.  If you have a wound, scrape, corn, or callus on your foot, look at it several times a day to make sure it is healing and not infected. Check for: ? Redness, swelling, or pain. ? Fluid or blood. ? Warmth. ? Pus or a bad smell. General instructions  Do not cross your legs. This may decrease blood flow to your feet.  Do not use heating pads or hot water bottles on your feet. They may burn your skin. If you have lost feeling in your feet or legs, you may not know this is happening until it is too late.  Protect your feet from hot and cold by wearing shoes, such as at the beach or on hot pavement.  Schedule a complete foot exam at least once a year (annually) or more often if you have foot problems. If you have foot problems, report any cuts, sores, or bruises to your health care provider immediately. Contact a health care provider if:  You have a medical condition that increases your risk of infection and you have any cuts, sores, or bruises on your feet.  You have an injury that is not   healing.  You have redness on your legs or feet.  You feel burning or tingling in your legs or feet.  You have pain or cramps in your legs and feet.  Your legs or feet are numb.  Your feet always feel cold.  You have pain around a toenail. Get help right away if:  You have a wound, scrape, corn, or callus on your foot and: ? You have pain, swelling, or redness that gets worse. ? You have fluid or blood coming from the wound, scrape, corn, or callus. ? Your wound, scrape, corn, or callus feels warm to the touch. ? You have pus or a bad smell coming from the wound, scrape, corn, or callus. ? You have a fever. ? You have a red line going up your leg. Summary  Check your feet every day  for cuts, sores, red spots, swelling, and blisters.  Moisturize feet and legs daily.  Wear shoes that fit properly and have enough cushioning.  If you have foot problems, report any cuts, sores, or bruises to your health care provider immediately.  Schedule a complete foot exam at least once a year (annually) or more often if you have foot problems. This information is not intended to replace advice given to you by your health care provider. Make sure you discuss any questions you have with your health care provider. Document Revised: 04/11/2019 Document Reviewed: 08/20/2016 Elsevier Patient Education  2020 Elsevier Inc.  

## 2019-08-26 ENCOUNTER — Encounter (INDEPENDENT_AMBULATORY_CARE_PROVIDER_SITE_OTHER): Payer: Self-pay | Admitting: Ophthalmology

## 2019-08-27 ENCOUNTER — Ambulatory Visit: Payer: Medicare PPO | Attending: Internal Medicine

## 2019-08-27 DIAGNOSIS — Z23 Encounter for immunization: Secondary | ICD-10-CM

## 2019-08-27 MED ORDER — PIOGLITAZONE HCL 30 MG PO TABS: 30.0000 mg | ORAL_TABLET | Freq: Every day | ORAL | 1 refills | Status: DC

## 2019-08-27 NOTE — Telephone Encounter (Signed)
Med sent to pharm 

## 2019-08-27 NOTE — Telephone Encounter (Signed)
NA

## 2019-08-27 NOTE — Progress Notes (Signed)
   Covid-19 Vaccination Clinic  Name:  Rebecca Perkins    MRN: [id number] DOB: 10/28/1928  08/27/2019  Rebecca Perkins was observed post Covid-19 immunization for 15 minutes without incidence. She was provided with Vaccine Information Sheet and instruction to access the V-Safe system.   Rebecca Perkins was instructed to call 911 with any severe reactions post vaccine: Marland Kitchen Difficulty breathing  . Swelling of your face and throat  . A fast heartbeat  . A bad rash all over your body  . Dizziness and weakness    Immunizations Administered    Name Date Dose VIS Date Route   Pfizer COVID-19 Vaccine 08/27/2019  4:01 PM 0.3 mL 07/13/2019 Intramuscular   Manufacturer: [place]   Lot: [id number]   [place]: [id number]

## 2019-08-30 NOTE — Progress Notes (Signed)
Subjective: Rebecca Perkins presents today for follow up of preventative diabetic foot care with and corn(s) right 2nd digit and painful mycotic toenails b/l that are difficult to trim. Pain interferes with ambulation. Aggravating factors include wearing enclosed shoe gear. Pain is relieved with periodic professional debridement..   Allergies  Allergen Reactions  . Lipitor [Atorvastatin Calcium] Diarrhea  . Niacin And Related Hives  . Statins Diarrhea  . Macrobid [Nitrofurantoin Macrocrystal] Other (See Comments)    unknown     Objective: There were no vitals filed for this visit.  Vascular Examination:  Capillary fill time to digits <3s b/l, palpable DP pulses b/l, palpable PT pulses b/l, pedal hair absent b/l and skin temperature gradient within normal limits b/l  Dermatological Examination: Pedal skin is thin shiny, atrophic bilaterally, no open wounds bilaterally, no interdigital macerations bilaterally, toenails 1-5 left, 1, 3, 4, 5 right elongated, dystrophic, thickened, crumbly with subungual debris and hyperkeratotic lesion(s) dorsal aspect right 2nd digit.  No erythema, no edema, no drainage, no flocculence  Musculoskeletal: Normal muscle strength 5/5 to all lower extremity muscle groups bilaterally, no pain crepitus or joint limitation noted with ROM b/l and hammertoes noted to the right, 2nd toe  Neurological: Protective sensation absent with 10g monofilament b/l  Assessment: 1. Pain due to onychomycosis of toenails of both feet   2. Corns   3. Diabetic peripheral neuropathy associated with type 2 diabetes mellitus ([place])    Plan: -Continue diabetic foot care principles. Literature dispensed on today.  -Toenails 1-5 left, 1, 3, 4, 5 right were debrided in length and girth without iatrogenic bleeding. -corns and calluses were debrided without complication or incident. Total number debrided =1 for right 2nd digit -Patient to continue soft, supportive shoe gear  daily. -Patient to report any pedal injuries to medical professional immediately. -Patient/POA to call should there be question/concern in the interim.  Return in about 3 months (around 11/22/2019) for diabetic nail and callus trim.

## 2019-09-09 ENCOUNTER — Other Ambulatory Visit: Payer: Self-pay | Admitting: Family Medicine

## 2019-09-11 LAB — HM DIABETES EYE EXAM

## 2019-09-17 ENCOUNTER — Ambulatory Visit: Payer: Medicare PPO | Attending: Internal Medicine

## 2019-09-17 DIAGNOSIS — Z23 Encounter for immunization: Secondary | ICD-10-CM | POA: Insufficient documentation

## 2019-09-17 NOTE — Progress Notes (Signed)
   Covid-19 Vaccination Clinic  Name:  Rebecca Perkins    MRN: [id number] DOB: [birth year]  09/17/2019  [name] was observed post Covid-19 immunization for 15 minutes without incidence. She was provided with Vaccine Information Sheet and instruction to access the V-Safe system.   [name] was instructed to call 911 with any severe reactions post vaccine: Marland Kitchen Difficulty breathing  . Swelling of your face and throat  . A fast heartbeat  . A bad rash all over your body  . Dizziness and weakness    Immunizations Administered    Name Date Dose VIS Date Route   Pfizer COVID-19 Vaccine 09/17/2019  3:01 PM 0.3 mL 07/13/2019 Intramuscular   Manufacturer: [place]   Lot: [id number]   [place]: [id number]

## 2019-09-26 DIAGNOSIS — L570 Actinic keratosis: Secondary | ICD-10-CM | POA: Diagnosis not present

## 2019-09-26 DIAGNOSIS — L720 Epidermal cyst: Secondary | ICD-10-CM | POA: Diagnosis not present

## 2019-09-26 DIAGNOSIS — D1801 Hemangioma of skin and subcutaneous tissue: Secondary | ICD-10-CM | POA: Diagnosis not present

## 2019-09-26 DIAGNOSIS — L821 Other seborrheic keratosis: Secondary | ICD-10-CM | POA: Diagnosis not present

## 2019-09-26 DIAGNOSIS — L814 Other melanin hyperpigmentation: Secondary | ICD-10-CM | POA: Diagnosis not present

## 2019-10-09 ENCOUNTER — Other Ambulatory Visit: Payer: Self-pay | Admitting: Family Medicine

## 2019-10-09 DIAGNOSIS — H52203 Unspecified astigmatism, bilateral: Secondary | ICD-10-CM | POA: Diagnosis not present

## 2019-10-09 DIAGNOSIS — E113293 Type 2 diabetes mellitus with mild nonproliferative diabetic retinopathy without macular edema, bilateral: Secondary | ICD-10-CM | POA: Diagnosis not present

## 2019-10-09 DIAGNOSIS — Z961 Presence of intraocular lens: Secondary | ICD-10-CM | POA: Diagnosis not present

## 2019-10-14 ENCOUNTER — Other Ambulatory Visit: Payer: Self-pay | Admitting: Endocrinology

## 2019-10-17 ENCOUNTER — Ambulatory Visit: Payer: Medicare PPO

## 2019-10-17 ENCOUNTER — Ambulatory Visit: Payer: Self-pay

## 2019-10-17 ENCOUNTER — Telehealth: Payer: Self-pay | Admitting: Family Medicine

## 2019-10-17 MED ORDER — CLOPIDOGREL BISULFATE 75 MG PO TABS: 75.0000 mg | ORAL_TABLET | Freq: Every day | ORAL | 3 refills | Status: DC

## 2019-10-17 MED ORDER — FENOFIBRATE 160 MG PO TABS: 160.0000 mg | ORAL_TABLET | Freq: Every day | ORAL | 3 refills | Status: DC

## 2019-10-17 MED ORDER — ROSUVASTATIN CALCIUM 20 MG PO TABS: ORAL_TABLET | ORAL | 3 refills | Status: DC

## 2019-10-17 MED ORDER — LOSARTAN POTASSIUM 100 MG PO TABS: 100.0000 mg | ORAL_TABLET | Freq: Every day | ORAL | 3 refills | Status: DC

## 2019-10-17 MED ORDER — FREESTYLE LIBRE 14 DAY SENSOR MISC: 3 refills | Status: DC

## 2019-10-17 NOTE — Telephone Encounter (Signed)
Pt's son called and states that the pt's BS has been dropping in the morning and not sure what to do about it?  Below are some of the readings. She feels bad and gets a little excited when it drops below 100.   FBS - 75, 101, 91, One day (56, 46, 46), 74, 68, 82, 89, 85, 62.  Aware Dr. Dennard Schaumann OOT

## 2019-10-18 NOTE — Telephone Encounter (Addendum)
Pt's son aware and pt is not taking Novolog (she only takes that if bs is above 200) and will cut Actos in half and call us in a week with readings.

## 2019-10-18 NOTE — Telephone Encounter (Signed)
Please verify but per notes She is on Tujeo 31 units  in the morning , trulcity and recently started on Actos 30 units  I also noted Novolog but not sure if she is taking this ?   Since Actos was last added, decrease this to 1/2 tablet ( 15mg ) once a day  Continue Trulicity  Tujeo 31 units Send [place] readings in 1 week

## 2019-10-27 ENCOUNTER — Other Ambulatory Visit: Payer: Self-pay | Admitting: Endocrinology

## 2019-10-30 ENCOUNTER — Telehealth: Payer: Self-pay | Admitting: Family Medicine

## 2019-10-30 NOTE — Telephone Encounter (Signed)
Pt's son [name] called LMOVM stating that pt is still having some lows at night between 12 AM - 6 AM. She is also having some swelling in her feet and ankles. Tried to call and had to lmovm to schedule apt with either [place] tomorrow or he can choose to wait until Thursday to see Dr. Dennard Schaumann. Depending on the severity of her swelling.

## 2019-11-05 ENCOUNTER — Encounter: Payer: Self-pay | Admitting: *Deleted

## 2019-11-08 ENCOUNTER — Telehealth: Payer: Self-pay | Admitting: Family Medicine

## 2019-11-08 NOTE — Telephone Encounter (Signed)
Pt's son Rebecca Perkins called and states that they have been cutting her Actos in half but she is still having lows at night. Upper 40's - 50's   He would also like to increase her Aricept to 10mg  but he does not want her to know and he will monitor for GI issues. He states that her short term memory is getting worse.

## 2019-11-09 ENCOUNTER — Other Ambulatory Visit: Payer: Self-pay | Admitting: Family Medicine

## 2019-11-09 MED ORDER — TOUJEO SOLOSTAR 300 UNIT/ML ~~LOC~~ SOPN: 20.0000 [IU] | PEN_INJECTOR | Freq: Every morning | SUBCUTANEOUS | 3 refills | Status: DC

## 2019-11-09 MED ORDER — DONEPEZIL HCL 10 MG PO TABS: 10.0000 mg | ORAL_TABLET | Freq: Every day | ORAL | 3 refills | Status: DC

## 2019-11-09 NOTE — Telephone Encounter (Signed)
Pt's son aware of recommendations

## 2019-11-09 NOTE — Telephone Encounter (Signed)
If having lows at night: Make sure Lantus is given in the AM Take a snack with bed Decrease Lantus from 31 to 20 units daily.  Please notify me of FBS in the am and night time sugars in 1 week.  I will send aricept 10 to her pharmacy.

## 2019-11-14 NOTE — Progress Notes (Signed)
[facility] Note  11/19/2019     CHIEF COMPLAINT Patient presents for Retina Follow Up   HISTORY OF PRESENT ILLNESS: Rebecca Perkins is a 86 y.o. female who presents to the clinic today for:   HPI    Retina Follow Up    Patient presents with  Diabetic Retinopathy.  In right eye.  This started months ago.  Severity is moderate.  Duration of months.  Since onset it is stable.  I, the attending physician,  performed the HPI with the patient and updated documentation appropriately.          Comments    Pt states her vision is the same OU.  Pt denies eye pain or discomfort.  Pt denies any new or worsening floaters or fol OU.       Last edited by Bernarda Caffey, MD on 11/20/2019  9:58 PM. (History)    pt states she is doing well, pt states she checks her blood sugar at home, her son states it has been running low in the mornings, he states it is usually between 100-200    Referring physician: [name], MD [address] [place],  [place] [address]  HISTORICAL INFORMATION:   Selected notes from the MEDICAL RECORD NUMBER Referred by Dr. Luberta Mutter for concern of DME   CURRENT MEDICATIONS: Current Outpatient Medications (Ophthalmic Drugs)  Medication Sig  . polyvinyl alcohol (LIQUIFILM TEARS) 1.4 % ophthalmic solution 1 drop as needed for dry eyes.   No current facility-administered medications for this visit. (Ophthalmic Drugs)   Current Outpatient Medications (Other)  Medication Sig  . Blood Glucose Monitoring Suppl (ONE TOUCH ULTRA SYSTEM KIT) W/DEVICE KIT 1 kit by Does not apply route once. Tests Blood sugar before meals and at bedtime  . Calcium Carbonate-Vitamin D (CALTRATE 600+D PO) Take by mouth 2 (two) times daily.  . clopidogrel (PLAVIX) 75 MG tablet Take 1 tablet (75 mg total) by mouth daily.  . Coenzyme Q10 (COQ10 PO) Take by mouth.  . Continuous Blood Gluc Sensor (FREESTYLE LIBRE 14 DAY SENSOR) MISC USE EVERY 14 DAYS   . denosumab (PROLIA) 60 MG/ML SOSY injection Inject into the skin every 6 (six) months.   . donepezil (ARICEPT) 10 MG tablet Take 1 tablet (10 mg total) by mouth at bedtime.  . fenofibrate 160 MG tablet Take 1 tablet (160 mg total) by mouth daily.  . hydrochlorothiazide (MICROZIDE) 12.5 MG capsule TAKE 1 CAPSULE(12.5 MG) BY MOUTH DAILY  . insulin glargine, 1 Unit Dial, (TOUJEO SOLOSTAR) 300 UNIT/ML Solostar Pen Inject 20 Units into the skin in the morning.  . insulin lispro (HUMALOG KWIKPEN) 100 UNIT/ML KwikPen Inject into the skin. Use only if blood sugar is over 200 in the am.  . Insulin Pen Needle (B-D ULTRAFINE III SHORT PEN) 31G X 8 MM MISC INJECT AS DIRECTED 4 TIMES DAILY  . INSULIN SYRINGE 1CC/29G 29G X 1/2" 1 ML MISC AS DIRECTED.  Marland Kitchen losartan (COZAAR) 100 MG tablet Take 1 tablet (100 mg total) by mouth daily.  . metoprolol succinate (TOPROL-XL) 50 MG 24 hr tablet TAKE 1 TABLET BY MOUTH EVERY DAY WITH OR IMMEDIATELY FOLLOWING A MEAL  . Multiple Vitamin (MULTIVITAMIN) tablet Take 1 tablet by mouth daily.    . NONFORMULARY OR COMPOUNDED ITEM [facility]:  Peripheral Neuropathy Cream - Bupivacaine 1%, Doxepin 3%, Gabapentin 6%, Pentoxifylline 3%, Topiramate 1%, apply 1-2 grams to affected ares 3-4 times a day prn.  Marland Kitchen  omeprazole (PRILOSEC) 20 MG capsule TAKE 1 CAPSULE BY MOUTH DAILY  . ONE TOUCH ULTRA TEST test strip USE FOUR TIMES DAILY BEFORE MEALS AND EVERY NIGHT AT BEDTIME  . pioglitazone (ACTOS) 30 MG tablet Take 1 tablet (30 mg total) by mouth daily.  . Pyridoxine HCl (VITAMIN B-6) 100 MG tablet Take 100 mg by mouth daily.    . rosuvastatin (CRESTOR) 20 MG tablet TAKE 1 TABLET(20 MG) BY MOUTH DAILY  . TRULICITY 1.5 UO/1.5IF SOPN INJECT 1.5MG (0.5ML) UNDER SKIN 1 TIME A WEEK  . VASCEPA 1 g CAPS TAKE 2 CAPSULES BY MOUTH TWICE DAILY   No current facility-administered medications for this visit. (Other)      REVIEW OF SYSTEMS: ROS    Positive for: Endocrine, Eyes   Negative  for: Constitutional, Gastrointestinal, Neurological, Skin, Genitourinary, Musculoskeletal, HENT, Cardiovascular, Respiratory, Psychiatric, Allergic/Imm, Heme/Lymph   Last edited by Doneen Poisson on 11/19/2019  9:45 AM. (History)       ALLERGIES Allergies  Allergen Reactions  . Lipitor [Atorvastatin Calcium] Diarrhea  . Niacin And Related Hives  . Statins Diarrhea  . Macrobid [Nitrofurantoin Macrocrystal] Other (See Comments)    unknown    PAST MEDICAL HISTORY Past Medical History:  Diagnosis Date  . Anemia   . Diabetes mellitus type 2, insulin dependent ([place])   . Diabetic retinopathy ([place])    NPDR OU  . Dyslipidemia   . GERD (gastroesophageal reflux disease)   . Hypertension   . Hypertensive retinopathy    OU  . Migraines   . Osteoporosis   . PVD (peripheral vascular disease) ([place])    abi .83 (L), .92 (R)  . RSD (reflex sympathetic dystrophy) 2007   R wrist/hand following fx  . Stroke ([place])   . TIA (transient ischemic attack)   . Varicose veins    Past Surgical History:  Procedure Laterality Date  . ABDOMINAL HYSTERECTOMY  1988  . APPENDECTOMY  1966  . BREAST CYST EXCISION    . CATARACT EXTRACTION Bilateral 10/2009  . CHOLECYSTECTOMY  1989  . EYE SURGERY Bilateral    Cat Sx OU  . Several benign cyst removed     last 1 in 1972  . TUBAL LIGATION    . UMBILICAL HERNIA REPAIR      FAMILY HISTORY Family History  Problem Relation Age of Onset  . Stroke Mother 69  . Hypertension Mother   . Clotting disorder Father   . Heart attack Father   . Arrhythmia Sister   . Stroke Brother   . Thyroid disease Neg Hx     SOCIAL HISTORY Social History   Tobacco Use  . Smoking status: Never Smoker  . Smokeless tobacco: Never Used  Substance Use Topics  . Alcohol use: No  . Drug use: No         OPHTHALMIC EXAM:  Base Eye Exam    Visual Acuity (Snellen - Linear)      Right Left   Dist [place] 20/40 20/30   Dist ph [place] 20/30 -2 20/25 -1       Tonometry  (Tonopen, 9:46 AM)      Right Left   Pressure 16 16       Pupils      Dark Light Shape React APD   Right 3 2 Round Minimal 0   Left 3 2 Round Minimal 0       Visual Fields      Left Right    Full Full  Extraocular Movement      Right Left    Full Full       Neuro/Psych    Oriented x3: Yes   Mood/Affect: Normal       Dilation    Both eyes: 1.0% Mydriacyl, 2.5% Phenylephrine @ 9:46 AM        Slit Lamp and Fundus Exam    Slit Lamp Exam      Right Left   Lids/Lashes Meibomian gland dysfunction, Dermatochalasis - upper lid Meibomian gland dysfunction, Dermatochalasis - upper lid   Conjunctiva/Sclera White and quiet White and quiet   Cornea 2+ central Punctate epithelial erosions, Arcus, dry tear film, irregular epi irregular tear film and epithelium, 1-2+ Punctate epithelial erosions, Arcus   Anterior Chamber Deep and quiet Deep and quiet   Iris Round and dilated, No NVI Round and dilated, No NVI   Lens PCIOL in good position with open PC PCIOL in good position with open PC   Vitreous mild Vitreous syneresis mild Vitreous syneresis       Fundus Exam      Right Left   Disc trace Pallor, Sharp rim trace Pallor, Sharp rim   C/D Ratio 0.4 0.6   Macula Flat, Blunted foveal reflex, scattered Microaneurysms, scattered non-central cystic changes, light focal laser scars ST macula Flat, blunted foveal reflex, scattered MA, no edema   Vessels Vascular attenuation, mild Tortuousity, no NVE Vascular attenuation, mild Tortuousity, no NVE   Periphery Attached, scattered DBH greatest posteriorly Attached, scattered DBH greatest posteriorly          IMAGING AND PROCEDURES  Imaging and Procedures for _0 @  OCT, Retina - OU - Both Eyes       Right Eye Quality was good. Central Foveal Thickness: 273. Progression has been stable. Findings include normal foveal contour, no SRF, intraretinal hyper-reflective material, intraretinal fluid (Mild persistent cystic changes  temporal macula).   Left Eye Quality was good. Central Foveal Thickness: 273. Progression has been stable. Findings include normal foveal contour, no IRF, no SRF.   Notes *Images captured and stored on drive  Diagnosis / Impression:  OD: Mild persistent cystic changes temporal macula OS: no DME  Clinical management:  See below  Abbreviations: NFP - Normal foveal profile. CME - cystoid macular edema. PED - pigment epithelial detachment. IRF - intraretinal fluid. SRF - subretinal fluid. EZ - ellipsoid zone. ERM - epiretinal membrane. ORA - outer retinal atrophy. ORT - outer retinal tubulation. SRHM - subretinal hyper-reflective material                 ASSESSMENT/PLAN:    ICD-10-CM   1. Moderate nonproliferative diabetic retinopathy of right eye with macular edema associated with type 2 diabetes mellitus ([place])  T70.0174   2. Moderate nonproliferative diabetic retinopathy of left eye without macular edema associated with type 2 diabetes mellitus ([place])  B44.9675   3. Retinal edema  H35.81 OCT, Retina - OU - Both Eyes  4. Pseudophakia of both eyes  Z96.1   5. Hypertensive retinopathy of both eyes  H35.033   6. Essential hypertension  I10     1-3. Moderate nonproliferative diabetic retinopathy w/ DME, OD  - OS without DME  - FA (07.14.20) shows late leaking MA OU; OD with focal hyperfluorescent leakage in area of DME  - s/p focal laser OD (11.09.20) - exam on 08/21/2019 shows mild decrease in cystic changes OD  - BCVA stable at 20/30 OD, 20/20 OS  - exam shows scattered MA/IRH OU  -  OCT shows noncentral cystic changes / DME--inf and sup macula OD -- persistent, OS no DME  - f/u 3 months, DFE, OCT/FA (optos, transit OD) -- may benefit from repeat focal laser OD  4,5. Hypertensive retinopathy OU  - discussed importance of tight BP control  - monitor  6. Pseudophakia OU  - s/p CE/IOL OU (Dr. Albina Billet)  - beautiful surgeries, doing well  - monitor   Ophthalmic Meds  Ordered this visit:  No orders of the defined types were placed in this encounter.      Return in about 3 months (around 02/18/2020) for f/u NPDR OU, DFE, OCT.  There are no Patient Instructions on file for this visit.   Explained the diagnoses, plan, and follow up with the patient and they expressed understanding.  Patient expressed understanding of the importance of proper follow up care.   This document serves as a record of services personally performed by Gardiner Sleeper, MD, PhD. It was created on their behalf by Leeann Must, [place], a certified ophthalmic assistant. The creation of this record is the provider's dictation and/or activities during the visit.    Electronically signed by: Leeann Must, COA _0 @ 9:59 PM   This document serves as a record of services personally performed by Gardiner Sleeper, MD, PhD. It was created on their behalf by Ernest Mallick, OA, an ophthalmic assistant. The creation of this record is the provider's dictation and/or activities during the visit.    Electronically signed by: Ernest Mallick, OA 04.19.2021 9:59 PM   Gardiner Sleeper, M.D., Ph.D. Diseases & Surgery of the Retina and Vitreous [facility]  I have reviewed the above documentation for accuracy and completeness, and I agree with the above. Gardiner Sleeper, M.D., Ph.D. 11/20/19 10:02 PM   Abbreviations: M myopia (nearsighted); A astigmatism; H hyperopia (farsighted); P presbyopia; Mrx spectacle prescription;  CTL contact lenses; OD right eye; OS left eye; OU both eyes  XT exotropia; ET esotropia; PEK punctate epithelial keratitis; PEE punctate epithelial erosions; DES dry eye syndrome; MGD meibomian gland dysfunction; ATs artificial tears; PFAT's preservative free artificial tears; [place] nuclear sclerotic cataract; PSC posterior subcapsular cataract; ERM epi-retinal membrane; PVD posterior vitreous detachment; RD retinal detachment; DM diabetes mellitus; DR diabetic  retinopathy; NPDR non-proliferative diabetic retinopathy; PDR proliferative diabetic retinopathy; CSME clinically significant macular edema; DME diabetic macular edema; dbh dot blot hemorrhages; CWS cotton wool spot; POAG primary open angle glaucoma; C/D cup-to-disc ratio; HVF humphrey visual field; GVF goldmann visual field; OCT optical coherence tomography; IOP intraocular pressure; BRVO Branch retinal vein occlusion; CRVO central retinal vein occlusion; CRAO central retinal artery occlusion; BRAO branch retinal artery occlusion; RT retinal tear; SB scleral buckle; PPV pars plana vitrectomy; VH Vitreous hemorrhage; PRP panretinal laser photocoagulation; IVK intravitreal kenalog; VMT vitreomacular traction; MH Macular hole;  NVD neovascularization of the disc; NVE neovascularization elsewhere; AREDS age related eye disease study; ARMD age related macular degeneration; POAG primary open angle glaucoma; EBMD epithelial/anterior basement membrane dystrophy; ACIOL anterior chamber intraocular lens; IOL intraocular lens; PCIOL posterior chamber intraocular lens; Phaco/IOL phacoemulsification with intraocular lens placement; [place] photorefractive keratectomy; LASIK laser assisted in situ keratomileusis; HTN hypertension; DM diabetes mellitus; COPD chronic obstructive pulmonary disease

## 2019-11-19 ENCOUNTER — Encounter (INDEPENDENT_AMBULATORY_CARE_PROVIDER_SITE_OTHER): Payer: Self-pay | Admitting: Ophthalmology

## 2019-11-19 ENCOUNTER — Ambulatory Visit (INDEPENDENT_AMBULATORY_CARE_PROVIDER_SITE_OTHER): Payer: Medicare PPO | Admitting: Ophthalmology

## 2019-11-19 DIAGNOSIS — Z961 Presence of intraocular lens: Secondary | ICD-10-CM | POA: Diagnosis not present

## 2019-11-19 DIAGNOSIS — E113311 Type 2 diabetes mellitus with moderate nonproliferative diabetic retinopathy with macular edema, right eye: Secondary | ICD-10-CM | POA: Diagnosis not present

## 2019-11-19 DIAGNOSIS — I10 Essential (primary) hypertension: Secondary | ICD-10-CM | POA: Diagnosis not present

## 2019-11-19 DIAGNOSIS — H35033 Hypertensive retinopathy, bilateral: Secondary | ICD-10-CM

## 2019-11-19 DIAGNOSIS — E113392 Type 2 diabetes mellitus with moderate nonproliferative diabetic retinopathy without macular edema, left eye: Secondary | ICD-10-CM | POA: Diagnosis not present

## 2019-11-19 DIAGNOSIS — H3581 Retinal edema: Secondary | ICD-10-CM

## 2019-11-20 ENCOUNTER — Encounter: Payer: Self-pay | Admitting: Podiatry

## 2019-11-20 ENCOUNTER — Other Ambulatory Visit: Payer: Self-pay

## 2019-11-20 ENCOUNTER — Ambulatory Visit: Payer: Medicare PPO | Admitting: Podiatry

## 2019-11-20 VITALS — Temp 97.4°F

## 2019-11-20 DIAGNOSIS — M79674 Pain in right toe(s): Secondary | ICD-10-CM

## 2019-11-20 DIAGNOSIS — E1142 Type 2 diabetes mellitus with diabetic polyneuropathy: Secondary | ICD-10-CM

## 2019-11-20 DIAGNOSIS — M79675 Pain in left toe(s): Secondary | ICD-10-CM

## 2019-11-20 DIAGNOSIS — L84 Corns and callosities: Secondary | ICD-10-CM

## 2019-11-20 DIAGNOSIS — B351 Tinea unguium: Secondary | ICD-10-CM

## 2019-11-20 NOTE — Patient Instructions (Signed)
Diabetes Mellitus and Foot Care Foot care is an important part of your health, especially when you have diabetes. Diabetes may cause you to have problems because of poor blood flow (circulation) to your feet and legs, which can cause your skin to:  Become thinner and drier.  Break more easily.  Heal more slowly.  Peel and crack. You may also have nerve damage (neuropathy) in your legs and feet, causing decreased feeling in them. This means that you may not notice minor injuries to your feet that could lead to more serious problems. Noticing and addressing any potential problems early is the best way to prevent future foot problems. How to care for your feet Foot hygiene  Wash your feet daily with warm water and mild soap. Do not use hot water. Then, pat your feet and the areas between your toes until they are completely dry. Do not soak your feet as this can dry your skin.  Trim your toenails straight across. Do not dig under them or around the cuticle. File the edges of your nails with an emery board or nail file.  Apply a moisturizing lotion or petroleum jelly to the skin on your feet and to dry, brittle toenails. Use lotion that does not contain alcohol and is unscented. Do not apply lotion between your toes. Shoes and socks  Wear clean socks or stockings every day. Make sure they are not too tight. Do not wear knee-high stockings since they may decrease blood flow to your legs.  Wear shoes that fit properly and have enough cushioning. Always look in your shoes before you put them on to be sure there are no objects inside.  To break in new shoes, wear them for just a few hours a day. This prevents injuries on your feet. Wounds, scrapes, corns, and calluses  Check your feet daily for blisters, cuts, bruises, sores, and redness. If you cannot see the bottom of your feet, use a mirror or ask someone for help.  Do not cut corns or calluses or try to remove them with medicine.  If you  find a minor scrape, cut, or break in the skin on your feet, keep it and the skin around it clean and dry. You may clean these areas with mild soap and water. Do not clean the area with peroxide, alcohol, or iodine.  If you have a wound, scrape, corn, or callus on your foot, look at it several times a day to make sure it is healing and not infected. Check for: ? Redness, swelling, or pain. ? Fluid or blood. ? Warmth. ? Pus or a bad smell. General instructions  Do not cross your legs. This may decrease blood flow to your feet.  Do not use heating pads or hot water bottles on your feet. They may burn your skin. If you have lost feeling in your feet or legs, you may not know this is happening until it is too late.  Protect your feet from hot and cold by wearing shoes, such as at the beach or on hot pavement.  Schedule a complete foot exam at least once a year (annually) or more often if you have foot problems. If you have foot problems, report any cuts, sores, or bruises to your health care provider immediately. Contact a health care provider if:  You have a medical condition that increases your risk of infection and you have any cuts, sores, or bruises on your feet.  You have an injury that is not   healing.  You have redness on your legs or feet.  You feel burning or tingling in your legs or feet.  You have pain or cramps in your legs and feet.  Your legs or feet are numb.  Your feet always feel cold.  You have pain around a toenail. Get help right away if:  You have a wound, scrape, corn, or callus on your foot and: ? You have pain, swelling, or redness that gets worse. ? You have fluid or blood coming from the wound, scrape, corn, or callus. ? Your wound, scrape, corn, or callus feels warm to the touch. ? You have pus or a bad smell coming from the wound, scrape, corn, or callus. ? You have a fever. ? You have a red line going up your leg. Summary  Check your feet every day  for cuts, sores, red spots, swelling, and blisters.  Moisturize feet and legs daily.  Wear shoes that fit properly and have enough cushioning.  If you have foot problems, report any cuts, sores, or bruises to your health care provider immediately.  Schedule a complete foot exam at least once a year (annually) or more often if you have foot problems. This information is not intended to replace advice given to you by your health care provider. Make sure you discuss any questions you have with your health care provider. Document Revised: 04/11/2019 Document Reviewed: 08/20/2016 Elsevier Patient Education  2020 Elsevier Inc.  

## 2019-11-22 ENCOUNTER — Other Ambulatory Visit: Payer: Self-pay | Admitting: Family Medicine

## 2019-11-22 MED ORDER — HYDROCHLOROTHIAZIDE 12.5 MG PO CAPS: ORAL_CAPSULE | ORAL | 3 refills | Status: DC

## 2019-11-23 NOTE — Progress Notes (Signed)
Subjective: Rebecca Perkins presents today for follow up of at risk foot care. Pt has h/o NIDDM with chronic kidney disease and corn(s) right 2nd and painful mycotic toenails b/l that are difficult to trim. Pain interferes with ambulation. Aggravating factors include wearing enclosed shoe gear. Pain is relieved with periodic professional debridement.  Allergies  Allergen Reactions  . Lipitor [Atorvastatin Calcium] Diarrhea  . Niacin And Related Hives  . Statins Diarrhea  . Macrobid [Nitrofurantoin Macrocrystal] Other (See Comments)    unknown     Objective: Vitals:   11/20/19 1116  Temp: (!) 97.4 F (36.3 C)    Pt 86 y.o. year old female  in NAD. AAO x 3.   Vascular Examination:  Capillary fill time to digits <3 seconds b/l. Palpable DP pulses b/l. Palpable PT pulses b/l. Pedal hair absent b/l Skin temperature gradient within normal limits b/l.  Dermatological Examination: Pedal skin is thin shiny, atrophic bilaterally. No open wounds bilaterally. No interdigital macerations bilaterally. Toenails L hallux, L 2nd toe, L 3rd toe, L 4th toe, L 5th toe, R hallux, R 3rd toe, R 4th toe and R 5th toe elongated, dystrophic, thickened, and crumbly with subungual debris and tenderness to dorsal palpation.  Musculoskeletal: Normal muscle strength 5/5 to all lower extremity muscle groups bilaterally, no pain crepitus or joint limitation noted with ROM b/l and hammertoes noted to the  R 2nd toe  Neurological: Protective sensation diminished with 10g monofilament b/l. Babinski reflex negative b/l. Achilles reflex 2+ b/. Clonus negative b/l.  Assessment: 1. Pain due to onychomycosis of toenails of both feet   2. Corns   3. Diabetic peripheral neuropathy associated with type 2 diabetes mellitus ([place])    Plan: -Continue diabetic foot care principles. Literature dispensed on today.  -Toenails 1-5 b/l were debrided in length and girth with sterile nail nippers and dremel without iatrogenic  bleeding.  -Corn(s) debrided R 2nd toe without complication or incident. Total number debrided=1. -Patient to continue soft, supportive shoe gear daily. -Patient to report any pedal injuries to medical professional immediately. -Patient/POA to call should there be question/concern in the interim.  Return in about 3 months (around 02/19/2020).

## 2019-11-26 ENCOUNTER — Ambulatory Visit (INDEPENDENT_AMBULATORY_CARE_PROVIDER_SITE_OTHER): Payer: Medicare PPO

## 2019-11-26 ENCOUNTER — Other Ambulatory Visit: Payer: Self-pay

## 2019-11-26 DIAGNOSIS — M810 Age-related osteoporosis without current pathological fracture: Secondary | ICD-10-CM | POA: Diagnosis not present

## 2019-11-26 MED ORDER — DENOSUMAB 60 MG/ML ~~LOC~~ SOSY
60.0000 mg | PREFILLED_SYRINGE | SUBCUTANEOUS | Status: AC
  Administered 2019-11-26 – 2020-11-04 (×3): 60 mg via SUBCUTANEOUS

## 2019-12-12 ENCOUNTER — Other Ambulatory Visit: Payer: Self-pay | Admitting: Family Medicine

## 2019-12-12 MED ORDER — PIOGLITAZONE HCL 30 MG PO TABS: 30.0000 mg | ORAL_TABLET | Freq: Every day | ORAL | 1 refills | Status: DC

## 2019-12-12 MED ORDER — DONEPEZIL HCL 10 MG PO TABS: 10.0000 mg | ORAL_TABLET | Freq: Every day | ORAL | 3 refills | Status: DC

## 2020-01-26 ENCOUNTER — Other Ambulatory Visit: Payer: Self-pay | Admitting: Endocrinology

## 2020-02-05 ENCOUNTER — Other Ambulatory Visit: Payer: Self-pay

## 2020-02-05 MED ORDER — ICOSAPENT ETHYL 1 G PO CAPS: 1.0000 g | ORAL_CAPSULE | Freq: Two times a day (BID) | ORAL | 3 refills | Status: DC

## 2020-02-07 ENCOUNTER — Other Ambulatory Visit: Payer: Self-pay

## 2020-02-07 MED ORDER — METOPROLOL SUCCINATE ER 50 MG PO TB24: ORAL_TABLET | ORAL | 1 refills | Status: DC

## 2020-02-13 ENCOUNTER — Other Ambulatory Visit: Payer: Self-pay

## 2020-02-13 MED ORDER — DONEPEZIL HCL 10 MG PO TABS: 10.0000 mg | ORAL_TABLET | Freq: Every day | ORAL | 3 refills | Status: DC

## 2020-02-14 NOTE — Progress Notes (Signed)
[facility] Note  02/18/2020     CHIEF COMPLAINT Patient presents for Retina Follow Up   HISTORY OF PRESENT ILLNESS: Rebecca Perkins is a 86 y.o. female who presents to the clinic today for:   HPI    Retina Follow Up    Patient presents with  Diabetic Retinopathy.  In both eyes.  This started 3 months ago.  Severity is moderate.  I, the attending physician,  performed the HPI with the patient and updated documentation appropriately.          Comments    Patient here for 3 months retina follow up for NPDR OU. Patient states vision doing pretty good. No eye pain.        Last edited by Bernarda Caffey, MD on 02/18/2020  1:16 PM. (History)    pt states she is "fine", she states her sugar levels are "pretty good", she takes 20 units of insulin every morning   Referring physician: Luberta Mutter, MD [place],  [place] [id number]  HISTORICAL INFORMATION:   Selected notes from the MEDICAL RECORD NUMBER Referred by Dr. Luberta Mutter for concern of DME   CURRENT MEDICATIONS: Current Outpatient Medications (Ophthalmic Drugs)  Medication Sig  . polyvinyl alcohol (LIQUIFILM TEARS) 1.4 % ophthalmic solution 1 drop as needed for dry eyes.   No current facility-administered medications for this visit. (Ophthalmic Drugs)   Current Outpatient Medications (Other)  Medication Sig  . Blood Glucose Monitoring Suppl (ONE TOUCH ULTRA SYSTEM KIT) W/DEVICE KIT 1 kit by Does not apply route once. Tests Blood sugar before meals and at bedtime  . Calcium Carbonate-Vitamin D (CALTRATE 600+D PO) Take by mouth 2 (two) times daily.  . clopidogrel (PLAVIX) 75 MG tablet Take 1 tablet (75 mg total) by mouth daily.  . Coenzyme Q10 (COQ10 PO) Take by mouth.  . Continuous Blood Gluc Sensor (FREESTYLE LIBRE 14 DAY SENSOR) MISC USE EVERY 14 DAYS  . denosumab (PROLIA) 60 MG/ML SOSY injection Inject into the skin every 6 (six) months.   . donepezil (ARICEPT) 10 MG  tablet Take 1 tablet (10 mg total) by mouth at bedtime.  . fenofibrate 160 MG tablet Take 1 tablet (160 mg total) by mouth daily.  . hydrochlorothiazide (MICROZIDE) 12.5 MG capsule TAKE 1 CAPSULE(12.5 MG) BY MOUTH DAILY  . icosapent Ethyl (VASCEPA) 1 g capsule Take 1 capsule (1 g total) by mouth 2 (two) times daily.  . insulin glargine, 1 Unit Dial, (TOUJEO SOLOSTAR) 300 UNIT/ML Solostar Pen Inject 20 Units into the skin in the morning.  . insulin lispro (HUMALOG KWIKPEN) 100 UNIT/ML KwikPen Inject into the skin. Use only if blood sugar is over 200 in the am.  . Insulin Pen Needle (B-D ULTRAFINE III SHORT PEN) 31G X 8 MM MISC INJECT AS DIRECTED 4 TIMES DAILY  . INSULIN SYRINGE 1CC/29G 29G X 1/2" 1 ML MISC AS DIRECTED.  Mar[place] Kitchen losartan (COZAAR) 100 MG tablet Take 1 tablet (100 mg total) by mouth daily.  . metoprolol succinate (TOPROL-XL) 50 MG 24 hr tablet TAKE 1 TABLET BY MOUTH EVERY DAY WITH OR IMMEDIATELY FOLLOWING A MEAL  . Multiple Vitamin (MULTIVITAMIN) tablet Take 1 tablet by mouth daily.    . NONFORMULARY OR COMPOUNDED ITEM [facility]:  Peripheral Neuropathy Cream - Bupivacaine 1%, Doxepin 3%, Gabapentin 6%, Pentoxifylline 3%, Topiramate 1%, apply 1-2 grams to affected ares 3-4 times a day prn.  . omeprazole (PRILOSEC) 20 MG capsule TAKE 1 CAPSULE BY  MOUTH DAILY  . ONE TOUCH ULTRA TEST test strip USE FOUR TIMES DAILY BEFORE MEALS AND EVERY NIGHT AT BEDTIME  . pioglitazone (ACTOS) 30 MG tablet Take 1 tablet (30 mg total) by mouth daily.  . Pyridoxine HCl (VITAMIN B-6) 100 MG tablet Take 100 mg by mouth daily.    . rosuvastatin (CRESTOR) 20 MG tablet TAKE 1 TABLET(20 MG) BY MOUTH DAILY  . TRULICITY 1.5 LG/9.2JJ SOPN INJECT 1.'5MG'$  (0.5ML) UNDER SKIN 1 TIME A WEEK   Current Facility-Administered Medications (Other)  Medication Route  . denosumab (PROLIA) injection 60 mg Subcutaneous      REVIEW OF SYSTEMS: ROS    Positive for: Gastrointestinal, Neurological, Genitourinary,  Musculoskeletal, HENT, Endocrine, Eyes   Negative for: Constitutional, Skin, Cardiovascular, Respiratory, Psychiatric, Allergic/Imm, Heme/Lymph   Last edited by Theodore Demark, COA on 02/18/2020 12:47 PM. (History)       ALLERGIES Allergies  Allergen Reactions  . Lipitor [Atorvastatin Calcium] Diarrhea  . Niacin And Related Hives  . Statins Diarrhea  . Macrobid [Nitrofurantoin Macrocrystal] Other (See Comments)    unknown    PAST MEDICAL HISTORY Past Medical History:  Diagnosis Date  . Anemia   . Diabetes mellitus type 2, insulin dependent ([place])   . Diabetic retinopathy ([place])    NPDR OU  . Dyslipidemia   . GERD (gastroesophageal reflux disease)   . Hypertension   . Hypertensive retinopathy    OU  . Migraines   . Osteoporosis   . PVD (peripheral vascular disease) ([place])    abi .83 (L), .92 (R)  . RSD (reflex sympathetic dystrophy) 2007   R wrist/hand following fx  . Stroke ([place])   . TIA (transient ischemic attack)   . Varicose veins    Past Surgical History:  Procedure Laterality Date  . ABDOMINAL HYSTERECTOMY  1988  . APPENDECTOMY  1966  . BREAST CYST EXCISION    . CATARACT EXTRACTION Bilateral 10/2009  . CHOLECYSTECTOMY  1989  . EYE SURGERY Bilateral    Cat Sx OU  . Several benign cyst removed     last 1 in 1972  . TUBAL LIGATION    . UMBILICAL HERNIA REPAIR      FAMILY HISTORY Family History  Problem Relation Age of Onset  . Stroke Mother 88  . Hypertension Mother   . Clotting disorder Father   . Heart attack Father   . Arrhythmia Sister   . Stroke Brother   . Thyroid disease Neg Hx     SOCIAL HISTORY Social History   Tobacco Use  . Smoking status: Never Smoker  . Smokeless tobacco: Never Used  Substance Use Topics  . Alcohol use: No  . Drug use: No         OPHTHALMIC EXAM:  Base Eye Exam    Visual Acuity (Snellen - Linear)      Right Left   Dist [place] 20/40 20/30 -2   Dist ph [place] 20/25 -2 20/25 -1       Tonometry (Tonopen, 12:45  PM)      Right Left   Pressure 15 16       Pupils      Dark Light Shape React APD   Right 3 2 Round Brisk None   Left 3 2 Round Brisk None       Visual Fields (Counting fingers)      Left Right    Full Full       Extraocular Movement      Right  Left    Full, Ortho Full, Ortho       Neuro/Psych    Oriented x3: Yes   Mood/Affect: Normal       Dilation    Both eyes: 1.0% Mydriacyl, 2.5% Phenylephrine @ 12:45 PM        Slit Lamp and Fundus Exam    Slit Lamp Exam      Right Left   Lids/Lashes Meibomian gland dysfunction, Dermatochalasis - upper lid Meibomian gland dysfunction, Dermatochalasis - upper lid   Conjunctiva/Sclera White and quiet White and quiet   Cornea 2+ central Punctate epithelial erosions, Arcus, dry tear film, 2+ Guttata irregular tear film and epithelium, 1-2+ Punctate epithelial erosions, Arcus, 1+ Guttata   Anterior Chamber Deep and quiet Deep and quiet   Iris Round and dilated, No NVI Round and dilated, No NVI   Lens PCIOL in good position with open PC PCIOL in good position with open PC   Vitreous mild Vitreous syneresis mild Vitreous syneresis       Fundus Exam      Right Left   Disc trace Pallor, Sharp rim trace Pallor, Sharp rim   C/D Ratio 0.4 0.6   Macula Flat, Blunted foveal reflex, scattered Microaneurysms, scattered non-central cystic changes - improved, light focal laser scars ST macula Flat, blunted foveal reflex, scattered MA, no edema   Vessels Vascular attenuation, mild Tortuousity, no NVE Vascular attenuation, mild Tortuousity, no NVE   Periphery Attached, scattered DBH greatest posteriorly Attached, scattered DBH greatest posteriorly          IMAGING AND PROCEDURES  Imaging and Procedures for '@TODAY'$ @  OCT, Retina - OU - Both Eyes       Right Eye Quality was good. Central Foveal Thickness: 278. Progression has improved. Findings include normal foveal contour, no SRF, intraretinal hyper-reflective material, intraretinal  fluid (Interval improvement in mild cystic changes ).   Left Eye Quality was good. Central Foveal Thickness: 265. Progression has been stable. Findings include normal foveal contour, no IRF, no SRF.   Notes *Images captured and stored on drive  Diagnosis / Impression:  OD: Interval improvement in mild cystic changes   OS: no DME  Clinical management:  See below  Abbreviations: NFP - Normal foveal profile. CME - cystoid macular edema. PED - pigment epithelial detachment. IRF - intraretinal fluid. SRF - subretinal fluid. EZ - ellipsoid zone. ERM - epiretinal membrane. ORA - outer retinal atrophy. ORT - outer retinal tubulation. SRHM - subretinal hyper-reflective material        Fluorescein Angiography Optos (Transit OD)       Right Eye   Progression has been stable. Early phase findings include microaneurysm, leakage (No NV). Mid/Late phase findings include microaneurysm, leakage (No NV).   Left Eye   Progression has been stable. Early phase findings include microaneurysm, leakage (No NV). Mid/Late phase findings include leakage, microaneurysm (No NV).   Notes Images stored on drive;   Impression: Moderate NPDR OU Scattered leaking MA OU No NV OU                 ASSESSMENT/PLAN:    ICD-10-CM   1. Moderate nonproliferative diabetic retinopathy of right eye with macular edema associated with type 2 diabetes mellitus (HCC)  B09.6283 Fluorescein Angiography Optos (Transit OD)  2. Moderate nonproliferative diabetic retinopathy of left eye without macular edema associated with type 2 diabetes mellitus (HCC)  M62.9476 Fluorescein Angiography Optos (Transit OD)  3. Retinal edema  H35.81 OCT, Retina - OU -  Both Eyes  4. Essential hypertension  I10   5. Hypertensive retinopathy of both eyes  H35.033 Fluorescein Angiography Optos (Transit OD)  6. Pseudophakia of both eyes  Z96.1     1-3. Moderate nonproliferative diabetic retinopathy w/ DME, OD  - OS without DME  -  FA (07.14.20 and 7.19.21) shows late leaking MA OU; OD with focal hyperfluorescent leakage in area of DME  - s/p focal laser OD (11.09.20)  - OCT shows interval improvement in cystic changes OD; OS no DME  - BCVA  20/25 OU  - exam shows scattered MA/IRH OU but no obvious targets for focal laser OD  - discussed treatments for DME -- pt wishes to hold off on intervention today -- reasonable  - f/u 3-4 months, DFE, OCT  4,5. Hypertensive retinopathy OU  - discussed importance of tight BP control  - monitor  6. Pseudophakia OU  - s/p CE/IOL OU (Dr. Albina Billet)  - beautiful surgeries, doing well  - monitor   Ophthalmic Meds Ordered this visit:  No orders of the defined types were placed in this encounter.      Return for f/u 3-4 months, NPDR OU, DFE, OCT.  There are no Patient Instructions on file for this visit.   Explained the diagnoses, plan, and follow up with the patient and they expressed understanding.  Patient expressed understanding of the importance of proper follow up care.   This document serves as a record of services personally performed by Gardiner Sleeper, MD, PhD. It was created on their behalf by Leonie Douglas, an ophthalmic technician. The creation of this record is the provider's dictation and/or activities during the visit.    Electronically signed by: Leonie Douglas COA, 02/18/20  2:40 PM   This document serves as a record of services personally performed by Gardiner Sleeper, MD, PhD. It was created on their behalf by San Jetty. Owens Shark, OA an ophthalmic technician. The creation of this record is the provider's dictation and/or activities during the visit.    Electronically signed by: San Jetty. Owens Shark, [place] 07.19.2021 2:40 PM    Gardiner Sleeper, M.D., Ph.D. Diseases & Surgery of the Retina and Vitreous [facility]  I have reviewed the above documentation for accuracy and completeness, and I agree with the above. Gardiner Sleeper, M.D., Ph.D.  02/18/20 2:40 PM   Abbreviations: M myopia (nearsighted); A astigmatism; H hyperopia (farsighted); P presbyopia; Mrx spectacle prescription;  CTL contact lenses; OD right eye; OS left eye; OU both eyes  XT exotropia; ET esotropia; PEK punctate epithelial keratitis; PEE punctate epithelial erosions; DES dry eye syndrome; MGD meibomian gland dysfunction; ATs artificial tears; PFAT's preservative free artificial tears; [place] nuclear sclerotic cataract; PSC posterior subcapsular cataract; ERM epi-retinal membrane; PVD posterior vitreous detachment; RD retinal detachment; DM diabetes mellitus; DR diabetic retinopathy; NPDR non-proliferative diabetic retinopathy; PDR proliferative diabetic retinopathy; CSME clinically significant macular edema; DME diabetic macular edema; dbh dot blot hemorrhages; CWS cotton wool spot; POAG primary open angle glaucoma; C/D cup-to-disc ratio; HVF humphrey visual field; GVF goldmann visual field; OCT optical coherence tomography; IOP intraocular pressure; BRVO Branch retinal vein occlusion; CRVO central retinal vein occlusion; CRAO central retinal artery occlusion; BRAO branch retinal artery occlusion; RT retinal tear; SB scleral buckle; PPV pars plana vitrectomy; VH Vitreous hemorrhage; PRP panretinal laser photocoagulation; IVK intravitreal kenalog; VMT vitreomacular traction; MH Macular hole;  NVD neovascularization of the disc; NVE neovascularization elsewhere; AREDS age related eye disease study; ARMD age  related macular degeneration; POAG primary open angle glaucoma; EBMD epithelial/anterior basement membrane dystrophy; ACIOL anterior chamber intraocular lens; IOL intraocular lens; PCIOL posterior chamber intraocular lens; Phaco/IOL phacoemulsification with intraocular lens placement; [place] photorefractive keratectomy; LASIK laser assisted in situ keratomileusis; HTN hypertension; DM diabetes mellitus; COPD chronic obstructive pulmonary disease

## 2020-02-15 ENCOUNTER — Other Ambulatory Visit: Payer: Self-pay

## 2020-02-18 ENCOUNTER — Other Ambulatory Visit: Payer: Self-pay

## 2020-02-18 ENCOUNTER — Encounter (INDEPENDENT_AMBULATORY_CARE_PROVIDER_SITE_OTHER): Payer: Self-pay | Admitting: Ophthalmology

## 2020-02-18 ENCOUNTER — Ambulatory Visit (INDEPENDENT_AMBULATORY_CARE_PROVIDER_SITE_OTHER): Payer: Medicare PPO | Admitting: Ophthalmology

## 2020-02-18 DIAGNOSIS — I10 Essential (primary) hypertension: Secondary | ICD-10-CM | POA: Diagnosis not present

## 2020-02-18 DIAGNOSIS — E113392 Type 2 diabetes mellitus with moderate nonproliferative diabetic retinopathy without macular edema, left eye: Secondary | ICD-10-CM

## 2020-02-18 DIAGNOSIS — H3581 Retinal edema: Secondary | ICD-10-CM

## 2020-02-18 DIAGNOSIS — E113311 Type 2 diabetes mellitus with moderate nonproliferative diabetic retinopathy with macular edema, right eye: Secondary | ICD-10-CM | POA: Diagnosis not present

## 2020-02-18 DIAGNOSIS — Z961 Presence of intraocular lens: Secondary | ICD-10-CM

## 2020-02-18 DIAGNOSIS — H35033 Hypertensive retinopathy, bilateral: Secondary | ICD-10-CM

## 2020-02-19 ENCOUNTER — Encounter: Payer: Self-pay | Admitting: Podiatry

## 2020-02-19 ENCOUNTER — Ambulatory Visit: Payer: Medicare PPO | Admitting: Podiatry

## 2020-02-19 DIAGNOSIS — E1142 Type 2 diabetes mellitus with diabetic polyneuropathy: Secondary | ICD-10-CM

## 2020-02-19 DIAGNOSIS — M2042 Other hammer toe(s) (acquired), left foot: Secondary | ICD-10-CM | POA: Diagnosis not present

## 2020-02-19 DIAGNOSIS — B351 Tinea unguium: Secondary | ICD-10-CM | POA: Diagnosis not present

## 2020-02-19 DIAGNOSIS — L84 Corns and callosities: Secondary | ICD-10-CM

## 2020-02-19 DIAGNOSIS — M79674 Pain in right toe(s): Secondary | ICD-10-CM

## 2020-02-19 DIAGNOSIS — M79675 Pain in left toe(s): Secondary | ICD-10-CM

## 2020-02-19 DIAGNOSIS — E119 Type 2 diabetes mellitus without complications: Secondary | ICD-10-CM

## 2020-02-19 DIAGNOSIS — M2041 Other hammer toe(s) (acquired), right foot: Secondary | ICD-10-CM

## 2020-02-19 MED ORDER — METOPROLOL SUCCINATE ER 50 MG PO TB24: ORAL_TABLET | ORAL | 1 refills | Status: DC

## 2020-02-22 NOTE — Progress Notes (Signed)
Subjective: Rebecca Perkins presents today for follow up of for annual diabetic foot examination, at risk foot care. Pt has h/o NIDDM with chronic kidney disease and painful corn(s) right 2nd and painful thick toenails that are difficult to trim. Pain interferes with ambulation. Aggravating factors include wearing enclosed shoe gear. Pain is relieved with periodic professional debridement.  Allergies  Allergen Reactions  . Lipitor [Atorvastatin Calcium] Diarrhea  . Niacin And Related Hives  . Statins Diarrhea  . Macrobid [Nitrofurantoin Macrocrystal] Other (See Comments)    unknown     Objective: There were no vitals filed for this visit.  Pt 86 y.o. year old female  in NAD. AAO x 3.   Vascular Examination:  Neurovascular status unchanged b/l lower extremities. Capillary fill time to digits <3 seconds b/l lower extremities. Palpable pedal pulses b/l LE. Pedal hair absent. Lower extremity skin temperature gradient within normal limits. No pain with calf compression b/l. No edema noted b/l lower extremities.  Dermatological Examination: Pedal skin is thin shiny, atrophic b/l lower extremities. No open wounds bilaterally. No interdigital macerations bilaterally. Toenails b/l lower extremities, L hallux, L 2nd toe, L 3rd toe, L 4th toe, L 5th toe, R hallux, R 3rd toe, R 4th toe and R 5th toe elongated, discolored, dystrophic, thickened, and crumbly with subungual debris and tenderness to dorsal palpation. Hyperkeratotic lesion(s) R 2nd toe.  No erythema, no edema, no drainage, no flocculence.  Musculoskeletal: Normal muscle strength 5/5 to all lower extremity muscle groups bilaterally. No pain crepitus or joint limitation noted with ROM b/l. Hammertoes noted to the R 2nd toe. Patient ambulates independent of any assistive aids.  Neurological: Protective sensation diminished with 10g monofilament b/l. Babinski reflex negative b/l. Achilles reflex 2+ b/. Clonus negative b/l.  Assessment: 1.  Pain due to onychomycosis of toenails of both feet   2. Corns   3. Acquired hammertoes of both feet   4. Diabetic peripheral neuropathy associated with type 2 diabetes mellitus ([place])   5. Encounter for diabetic foot exam ([place])     Plan: -Continue diabetic foot care principles. Literature dispensed on today.  -Toenails 1-5 b/l were debrided in length and girth with sterile nail nippers and dremel without iatrogenic bleeding.  -Corn(s) debrided R 2nd toe without complication or incident. Total number debrided=1. Iatrogenic laceration sustained during debridement of right 2nd digit corn. Treated with Lumicain Hemostatic Solution, alcohol and triple antibiotic ointment. Light dressing applied. Patient instructed to apply Neosporin to right 2nd toe once daily for 7 days. Call if she has any problems.  -Patient to continue soft, supportive shoe gear daily. -Patient to report any pedal injuries to medical professional immediately. -Patient/POA to call should there be question/concern in the interim.  Return in about 3 months (around 05/21/2020) for diabetic nail and callus trim.

## 2020-03-31 ENCOUNTER — Ambulatory Visit: Payer: Medicare PPO | Admitting: Family Medicine

## 2020-03-31 ENCOUNTER — Other Ambulatory Visit: Payer: Self-pay

## 2020-03-31 VITALS — BP 120/60 | HR 66 | Temp 97.5°F | Ht 64.0 in | Wt 145.0 lb

## 2020-03-31 DIAGNOSIS — Z8673 Personal history of transient ischemic attack (TIA), and cerebral infarction without residual deficits: Secondary | ICD-10-CM

## 2020-03-31 DIAGNOSIS — Z0000 Encounter for general adult medical examination without abnormal findings: Secondary | ICD-10-CM

## 2020-03-31 DIAGNOSIS — Z0001 Encounter for general adult medical examination with abnormal findings: Secondary | ICD-10-CM

## 2020-03-31 DIAGNOSIS — M810 Age-related osteoporosis without current pathological fracture: Secondary | ICD-10-CM | POA: Diagnosis not present

## 2020-03-31 DIAGNOSIS — Z794 Long term (current) use of insulin: Secondary | ICD-10-CM

## 2020-03-31 DIAGNOSIS — I10 Essential (primary) hypertension: Secondary | ICD-10-CM

## 2020-03-31 DIAGNOSIS — D649 Anemia, unspecified: Secondary | ICD-10-CM

## 2020-03-31 DIAGNOSIS — E119 Type 2 diabetes mellitus without complications: Secondary | ICD-10-CM

## 2020-03-31 DIAGNOSIS — R413 Other amnesia: Secondary | ICD-10-CM

## 2020-03-31 MED ORDER — DONEPEZIL HCL 10 MG PO TABS: 10.0000 mg | ORAL_TABLET | Freq: Every day | ORAL | 3 refills | Status: DC

## 2020-03-31 NOTE — Progress Notes (Signed)
Subjective:    Patient ID: Rebecca Perkins, female    DOB: 01-May-1929, 86 y.o.   MRN: [id number] 10/27/18 Patient recently had a neuropsychological evaluation.  I have copied the results below for my reference: "This patient, with a history of cerebrovascular disease, is currently functioning in the mildly impaired range of overall intellectual ability.  Her scores are much lower than that predicted by her academic and vocational history, suggesting evidence of least moderate global intellectual decline.  She has some difficulty on a test of sustained attention, especially when there is an interface component.  She shows various degrees of deficit in her memory for both auditory verbal and visual information, both at immediate recall, as well as after a delay.  She showed a mild expressive language or word finding deficit and naming but no deficit in fluency.  Comprehension appears spared.  She showed a mild deficit in her ability to perceive spatial relationships.  She has a tremendous amount difficulty on a rather simple and straightforward tasks of executive functions, especially when the task involved shifting cognitive set.  She is reporting severe anxiety and mild depression.  However, affective disorder alone would not account for all the deficits in the test battery.  She showed some degree of deficit across virtually every domain of this rather extensive battery.  This pattern is more consistent with a degenerative process rather than focal or lateralized deficit such as one might expect from a stroke."  RECOMMENDATIONS: 1.  It is recommended that the patient's physician work with her to find a medication regimen that more thoroughly addresses her severe anxiety 2.  It is recommended that the family consider having someone such as a home health aide or find a family member stay with her at night to prevent going out at 2 AM for otherwise monitoring 3.  Is recommended that the patient continue  to limit her driving to very familiar routes close to home and only in daylight hours  She is here today to discuss the results with her son as well as to discuss treatment options.  Son states that while her daughter was here staying with her, she has been doing better.  Apparently before this, the patient was having hallucinations frequently.  At night she would believe that she would see someone in the house.  She would see them walking down the hall.  She would hear them talking in another room.  At times she became so concerned that she would call her sons convinced that someone was in her home.  Since her daughter has been staying with her at night, these events have almost stopped however they have seen persistent problems with short-term memory.  Patient is still driving but only during the daytime.  She drives on 2 lane roads.  She is able today to easily answer my questions and shows normal insight and good judgment.  She shows insight into knowing that the hallucinations were not real.  However she states that the time they seemed very real.  She denies any depression.  Her anxiety that they discuss was primarily due to the fears of having someone else in the home.  However today she denies any depression or anxiety or suicidal thoughts.  Both her and her son deny any delusional thoughts.  She is eating well.  She still self administering her medication.  At that time, my plan was: I spent more than 30 minutes today in the room with the patient and her  son going over her exam results and discussing her treatment options.  We discussed adding a medication such as Abilify to try to decrease the frequency of hallucinations.  However at the present time we have elected to start Aricept 5 mg a day for dementia and increase to 10 mg a day in 1 month if she is tolerating the medication well.  I have recommended that family member stay with her at night.  I also recommended that family number administer the  insulin to avoid improper dosing.  We will also switch her Lopressor to Toprol-XL 50 mg once a day to avoid her missing the nighttime dose.  She frequently forgets to take her medications at night.  I believe that having a family member stay with her will prevent the delusions and hallucinations at night, decrease the frequency of sundowning, assist in keeping her more oriented, help in administering medication.  Consider adding Namenda if dementia and memory loss worsen despite increasing Aricept to 10 mg over the next few months.  06/04/19 Patient is here today with her son for follow-up.  The purpose of today's visit was to reassess her memory.  When I last saw her I started her on Aricept 5 mg a day.  We increased it to 10 mg a day however she had gastrointestinal side effects from this.  Primarily she experienced diarrhea.  Therefore she decreased back to 5 mg a day.  Over the last 6 months, her son states that she has been doing relatively well.  He thinks that she is actually doing better than before.  She is not hallucinating.  She is not having delusions or hallucinations at night.  She has more commonly started to repeat herself.  For instance her son states that she is telling them the same story over and over and over with no recollection of having told that story just an hour or 2 before.  However aside from that, the patient has been doing well.  Patient and her son have asked to transfer the management of her diabetes to this office.  Apparently there has been communication issues with her endocrinologist.  Given her dementia, they would like to have a start to manage her medical conditions to avoid confusion for the patient.  At present she is on 31 units of long-acting insulin that she takes first thing in the morning.  She is on Trulicity.  She does not feel comfortable adding quick acting mealtime insulin due to hypoglycemia.  Her most recent hemoglobin A1c in October was 7.8.  Her triglycerides  were also over 300 despite taking Crestor and Vascepa.  However she was not fasting for that lab work and therefore the accuracy of the triglycerides is in question.  At that time, my plan was: Patient's memory loss seems stable.  Ultimately the son and the patient both elected continue Aricept at his current dose.  We will reassess every 3 to 6 months and if worsening, either increase Aricept to 10 or add Namenda.  Regarding her triglycerides, I have asked the patient to return fasting at her earliest convenience and we can recheck them.  Given her advanced age and polypharmacy I would not add fenofibrate. Triglycerides are well above 300.  I spoke very highly of her endocrinologist Dr. Dwyane Dee.  I believe he has done an outstanding job.  However for a variety of issues, they would like one doctor to manage her issues primarily due to her worsening dementia.  We had a  long discussion today about goals of care.  Ultimately given her independent living situation and increasing confusion, I would be happy with her hemoglobin A1c between 7 and 8.  Therefore we will make no changes to her medication at the present time.  03/31/20 Patient is here today for a annual wellness visit.  She is accompanied by her son [name].  He states that her memory has gotten slightly worse.  He gives the instance of when she tried to administer the Trulicity I forgot to take the.  She was unable to determine why the device was not working.  However that is an isolated incident.  She is still able to schedule and keep her appointments.  She is not driving.  She is still able to administer her Toujeo.  She is not drawing up the wrong dose or administering the wrong dose or forgetting to administer the medicine correctly.  Her blood sugars are typically between 102 100.  She will occasionally have a blood sugar greater than 200 if she has eaten indiscriminately.  She has had 2 separate issues where her blood sugar was less than 80.  On both  of those occasions she had skipped a meal.  However the majority of her blood sugars are between 100-200.  She denies any depression.  She has had a recent fall.  She tripped going back into the house over the threshold and landed on her face striking her nose.  Fortunately she was not injured.  Past Medical History:  Diagnosis Date  . Anemia   . Diabetes mellitus type 2, insulin dependent ([place])   . Diabetic retinopathy ([place])    NPDR OU  . Dyslipidemia   . GERD (gastroesophageal reflux disease)   . Hypertension   . Hypertensive retinopathy    OU  . Migraines   . Osteoporosis   . PVD (peripheral vascular disease) ([place])    abi .83 (L), .92 (R)  . RSD (reflex sympathetic dystrophy) 2007   R wrist/hand following fx  . Stroke ([place])   . TIA (transient ischemic attack)   . Varicose veins    Past Surgical History:  Procedure Laterality Date  . ABDOMINAL HYSTERECTOMY  1988  . APPENDECTOMY  1966  . BREAST CYST EXCISION    . CATARACT EXTRACTION Bilateral 10/2009  . CHOLECYSTECTOMY  1989  . EYE SURGERY Bilateral    Cat Sx OU  . Several benign cyst removed     last 1 in 1972  . TUBAL LIGATION    . UMBILICAL HERNIA REPAIR     Current Outpatient Medications on File Prior to Visit  Medication Sig Dispense Refill  . Blood Glucose Monitoring Suppl (ONE TOUCH ULTRA SYSTEM KIT) W/DEVICE KIT 1 kit by Does not apply route once. Tests Blood sugar before meals and at bedtime 1 each 0  . Calcium Carbonate-Vitamin D (CALTRATE 600+D PO) Take by mouth 2 (two) times daily.    . clopidogrel (PLAVIX) 75 MG tablet Take 1 tablet (75 mg total) by mouth daily. 90 tablet 3  . Coenzyme Q10 (COQ10 PO) Take by mouth.    . Continuous Blood Gluc Sensor (FREESTYLE LIBRE 14 DAY SENSOR) MISC USE EVERY 14 DAYS 6 each 3  . denosumab (PROLIA) 60 MG/ML SOSY injection Inject into the skin every 6 (six) months.     . donepezil (ARICEPT) 10 MG tablet Take 1 tablet (10 mg total) by mouth at bedtime. 90 tablet 3  .  fenofibrate 160 MG tablet Take 1 tablet (  160 mg total) by mouth daily. 90 tablet 3  . hydrochlorothiazide (MICROZIDE) 12.5 MG capsule TAKE 1 CAPSULE(12.5 MG) BY MOUTH DAILY 90 capsule 3  . icosapent Ethyl (VASCEPA) 1 g capsule Take 1 capsule (1 g total) by mouth 2 (two) times daily. 240 capsule 3  . insulin glargine, 1 Unit Dial, (TOUJEO SOLOSTAR) 300 UNIT/ML Solostar Pen Inject 20 Units into the skin in the morning. 3 pen 3  . insulin lispro (HUMALOG KWIKPEN) 100 UNIT/ML KwikPen Inject into the skin. Use only if blood sugar is over 200 in the am.    . Insulin Pen Needle (B-D ULTRAFINE III SHORT PEN) 31G X 8 MM MISC INJECT AS DIRECTED 4 TIMES DAILY 300 each 2  . INSULIN SYRINGE 1CC/29G 29G X 1/2" 1 ML MISC AS DIRECTED. 100 each 3  . losartan (COZAAR) 100 MG tablet Take 1 tablet (100 mg total) by mouth daily. 90 tablet 3  . metoprolol succinate (TOPROL-XL) 50 MG 24 hr tablet TAKE 1 TABLET BY MOUTH EVERY DAY WITH OR IMMEDIATELY FOLLOWING A MEAL 90 tablet 1  . Multiple Vitamin (MULTIVITAMIN) tablet Take 1 tablet by mouth daily.      . NONFORMULARY OR COMPOUNDED ITEM [facility]:  Peripheral Neuropathy Cream - Bupivacaine 1%, Doxepin 3%, Gabapentin 6%, Pentoxifylline 3%, Topiramate 1%, apply 1-2 grams to affected ares 3-4 times a day prn. 100 each 5  . omeprazole (PRILOSEC) 20 MG capsule TAKE 1 CAPSULE BY MOUTH DAILY 90 capsule 3  . ONE TOUCH ULTRA TEST test strip USE FOUR TIMES DAILY BEFORE MEALS AND EVERY NIGHT AT BEDTIME 150 each 5  . pioglitazone (ACTOS) 30 MG tablet Take 1 tablet (30 mg total) by mouth daily. 90 tablet 1  . polyvinyl alcohol (LIQUIFILM TEARS) 1.4 % ophthalmic solution 1 drop as needed for dry eyes.    . Pyridoxine HCl (VITAMIN B-6) 100 MG tablet Take 100 mg by mouth daily.      . rosuvastatin (CRESTOR) 20 MG tablet TAKE 1 TABLET(20 MG) BY MOUTH DAILY 90 tablet 3  . TRULICITY 1.5 OH/6.0VP SOPN INJECT 1.$RemoveBefor'5MG'[id number]$  (0.5ML) UNDER SKIN 1 TIME A WEEK 2 mL 2   Current  Facility-Administered Medications on File Prior to Visit  Medication Dose Route Frequency Provider Last Rate Last Admin  . denosumab (PROLIA) injection 60 mg  60 mg Subcutaneous Q6 months [name], MD   60 mg at 11/26/19 1407   Allergies  Allergen Reactions  . Lipitor [Atorvastatin Calcium] Diarrhea  . Niacin And Related Hives  . Statins Diarrhea  . Macrobid [Nitrofurantoin Macrocrystal] Other (See Comments)    unknown   Social History   Socioeconomic History  . Marital status: Widowed    Spouse name: Not on file  . Number of children: 3  . Years of education: Not on file  . Highest education level: Not on file  Occupational History  . Occupation: Retired  Tobacco Use  . Smoking status: Never Smoker  . Smokeless tobacco: Never Used  Substance and Sexual Activity  . Alcohol use: No  . Drug use: No  . Sexual activity: Not on file  Other Topics Concern  . 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 Difficulty of Paying Living Expenses: Not on file  Food Insecurity:   . Worried  About Running Out of Food in the Last Year: Not on file  . Ran Out of Food in the Last Year: Not on file  Transportation Needs:   . Lack of Transportation (Medical): Not on file  . Lack of Transportation (Non-Medical): Not on file  Physical Activity:   . Days of Exercise per Week: Not on file  . Minutes of Exercise per Session: Not on file  Stress:   . Feeling of Stress : Not on file  Social Connections:   . Frequency of Communication with Friends and Family: Not on file  . Frequency of Social Gatherings with Friends and Family: Not on file  . Attends Religious Services: Not on file  . Active Member of Clubs or Organizations: Not on file  . Attends Archivist Meetings: Not on file  .  Marital Status: Not on file  Intimate Partner Violence:   . Fear of Current or Ex-Partner: Not on file  . Emotionally Abused: Not on file  . Physically Abused: Not on file  . Sexually Abused: Not on file     Review of Systems  All other systems reviewed and are negative.      Objective:   Physical Exam Vitals reviewed.  Constitutional:      General: She is not in acute distress.    Appearance: Normal appearance. She is well-developed. She is obese. She is not ill-appearing, toxic-appearing or diaphoretic.  HENT:     Head: Normocephalic and atraumatic.     Right Ear: Tympanic membrane and ear canal normal. There is no impacted cerumen.     Left Ear: Tympanic membrane and ear canal normal. There is no impacted cerumen.     Nose: Nose normal. No congestion or rhinorrhea.     Mouth/Throat:     Mouth: Mucous membranes are moist.     Pharynx: Oropharynx is clear. No oropharyngeal exudate or posterior oropharyngeal erythema.  Eyes:     General: No scleral icterus.       Right eye: No discharge.        Left eye: No discharge.     Conjunctiva/sclera: Conjunctivae normal.     Pupils: Pupils are equal, round, and reactive to light.  Neck:     Vascular: No carotid bruit or JVD.  Cardiovascular:     Rate and Rhythm: Normal rate and regular rhythm.     Heart sounds: Normal heart sounds. No murmur heard.  No friction rub. No gallop.   Pulmonary:     Effort: Pulmonary effort is normal. No respiratory distress.     Breath sounds: Normal breath sounds. No stridor. No wheezing, rhonchi or rales.  Chest:     Chest wall: No tenderness.  Abdominal:     General: Bowel sounds are normal. There is no distension.     Palpations: Abdomen is soft. There is no mass.     Tenderness: There is no abdominal tenderness. There is no right CVA tenderness, left CVA tenderness, guarding or rebound.     Hernia: No hernia is present.  Musculoskeletal:     Cervical back: Neck supple. No rigidity or  tenderness.     Right lower leg: No edema.     Left lower leg: No edema.  Lymphadenopathy:     Cervical: No cervical adenopathy.  Skin:    Findings: No erythema.  Neurological:     General: No focal deficit present.     Mental Status: She is alert and oriented to person, place, and  time.     Cranial Nerves: No cranial nerve deficit.     Sensory: No sensory deficit.     Motor: No weakness.     Coordination: Coordination normal.     Gait: Gait normal.  Psychiatric:        Mood and Affect: Mood normal.        Behavior: Behavior normal.        Thought Content: Thought content normal.        Judgment: Judgment normal.           Assessment & Plan:  Diabetes mellitus type 2, insulin dependent ([facility]) - Plan: CBC with Differential/Platelet, COMPLETE METABOLIC PANEL WITH GFR, Hemoglobin A1c  Osteoporosis, unspecified osteoporosis type, unspecified pathological fracture presence  Memory loss  History of CVA (cerebrovascular accident)  Essential hypertension  General medical exam  Patient seems to be doing relatively well despite her advanced age.  We will continue Aricept 10 mg daily.  We will not add Namenda at this time.  Immunizations are up-to-date although I did recommend a flu shot this fall as well as a booster on her Covid vaccination in October.  I would not recommend a mammogram, colonoscopy, or Pap smear due to her age.  She is a moderate fall risk and therefore I recommended that she wear a fall pendant with a gyrus scope to detect falls particularly if she suffers a concussion.  Sons check on her frequently throughout the day.  Blood sugars seem relatively well controlled.  Check hemoglobin A1c.  As long as hemoglobin A1c is less than 8 I am satisfied with that.  Also check CBC and a CMP along with a fasting lipid panel.

## 2020-04-01 LAB — CBC WITH DIFFERENTIAL/PLATELET
Absolute Monocytes: 488 cells/uL (ref 200–950)
Basophils Absolute: 43 cells/uL (ref 0–200)
Basophils Relative: 0.7 %
Eosinophils Absolute: 140 cells/uL (ref 15–500)
Eosinophils Relative: 2.3 %
HCT: 33.2 % — ABNORMAL LOW (ref 35.0–45.0)
Hemoglobin: 10.7 g/dL — ABNORMAL LOW (ref 11.7–15.5)
Lymphs Abs: 1909 cells/uL (ref 850–3900)
MCH: 28 pg (ref 27.0–33.0)
MCHC: 32.2 g/dL (ref 32.0–36.0)
MCV: 86.9 fL (ref 80.0–100.0)
MPV: 10.1 fL (ref 7.5–12.5)
Monocytes Relative: 8 %
Neutro Abs: 3520 cells/uL (ref 1500–7800)
Neutrophils Relative %: 57.7 %
Platelets: 226 10*3/uL (ref 140–400)
RBC: 3.82 10*6/uL (ref 3.80–5.10)
RDW: 12.7 % (ref 11.0–15.0)
Total Lymphocyte: 31.3 %
WBC: 6.1 10*3/uL (ref 3.8–10.8)

## 2020-04-01 LAB — COMPLETE METABOLIC PANEL WITH GFR
AG Ratio: 1.3 (calc) (ref 1.0–2.5)
ALT: 16 U/L (ref 6–29)
AST: 28 U/L (ref 10–35)
Albumin: 3.7 g/dL (ref 3.6–5.1)
Alkaline phosphatase (APISO): 30 U/L — ABNORMAL LOW (ref 37–153)
BUN/Creatinine Ratio: 15 (calc) (ref 6–22)
BUN: 18 mg/dL (ref 7–25)
CO2: 26 mmol/L (ref 20–32)
Calcium: 9.8 mg/dL (ref 8.6–10.4)
Chloride: 105 mmol/L (ref 98–110)
Creat: 1.2 mg/dL — ABNORMAL HIGH (ref 0.60–0.88)
GFR, Est African American: 47 mL/min/{1.73_m2} — ABNORMAL LOW (ref >=60)
GFR, Est Non African American: 40 mL/min/{1.73_m2} — ABNORMAL LOW (ref >=60)
Globulin: 2.9 g/dL (calc) (ref 1.9–3.7)
Glucose, Bld: 188 mg/dL — ABNORMAL HIGH (ref 65–99)
Potassium: 3.8 mmol/L (ref 3.5–5.3)
Sodium: 140 mmol/L (ref 135–146)
Total Bilirubin: 0.5 mg/dL (ref 0.2–1.2)
Total Protein: 6.6 g/dL (ref 6.1–8.1)

## 2020-04-01 LAB — HEMOGLOBIN A1C
Hgb A1c MFr Bld: 6.8 % of total Hgb — ABNORMAL HIGH (ref <5.7)
Mean Plasma Glucose: 148 (calc)
eAG (mmol/L): 8.2 (calc)

## 2020-04-01 NOTE — Addendum Note (Signed)
Addended by: Saundra Shelling on: 04/01/2020 08:26 AM   Modules accepted: Orders

## 2020-04-18 ENCOUNTER — Other Ambulatory Visit: Payer: Self-pay | Admitting: Endocrinology

## 2020-04-28 ENCOUNTER — Ambulatory Visit (INDEPENDENT_AMBULATORY_CARE_PROVIDER_SITE_OTHER): Payer: Medicare PPO | Admitting: *Deleted

## 2020-04-28 ENCOUNTER — Other Ambulatory Visit: Payer: Self-pay

## 2020-04-28 DIAGNOSIS — Z23 Encounter for immunization: Secondary | ICD-10-CM

## 2020-04-28 DIAGNOSIS — M810 Age-related osteoporosis without current pathological fracture: Secondary | ICD-10-CM | POA: Diagnosis not present

## 2020-04-30 NOTE — Progress Notes (Signed)
Documentation added.

## 2020-05-20 ENCOUNTER — Encounter: Payer: Self-pay | Admitting: Podiatry

## 2020-05-20 ENCOUNTER — Other Ambulatory Visit: Payer: Self-pay

## 2020-05-20 ENCOUNTER — Ambulatory Visit: Payer: Medicare PPO | Admitting: Podiatry

## 2020-05-20 DIAGNOSIS — E1142 Type 2 diabetes mellitus with diabetic polyneuropathy: Secondary | ICD-10-CM | POA: Diagnosis not present

## 2020-05-20 DIAGNOSIS — M79675 Pain in left toe(s): Secondary | ICD-10-CM | POA: Diagnosis not present

## 2020-05-20 DIAGNOSIS — M79674 Pain in right toe(s): Secondary | ICD-10-CM

## 2020-05-20 DIAGNOSIS — L84 Corns and callosities: Secondary | ICD-10-CM | POA: Diagnosis not present

## 2020-05-20 DIAGNOSIS — M2041 Other hammer toe(s) (acquired), right foot: Secondary | ICD-10-CM

## 2020-05-20 DIAGNOSIS — M2042 Other hammer toe(s) (acquired), left foot: Secondary | ICD-10-CM

## 2020-05-20 DIAGNOSIS — B351 Tinea unguium: Secondary | ICD-10-CM

## 2020-05-25 NOTE — Progress Notes (Signed)
Subjective: Rebecca Perkins presents today for follow up of for annual diabetic foot examination, at risk foot care. Pt has h/o NIDDM with chronic kidney disease and painful corn(s) right 2nd and painful thick toenails that are difficult to trim. Pain interferes with ambulation. Aggravating factors include wearing enclosed shoe gear. Pain is relieved with periodic professional debridement.   She states her blood glucose was between 100-120 mg/dl this morning. She voices no new pedal concerns on today's visit.  Allergies  Allergen Reactions  . Lipitor [Atorvastatin Calcium] Diarrhea  . Niacin And Related Hives  . Statins Diarrhea  . Macrobid [Nitrofurantoin Macrocrystal] Other (See Comments)    unknown     Objective: There were no vitals filed for this visit.  Pt is a pleasant 86 y.o. year old Caucasian  female in NAD. AAO x 3.   Vascular Examination:  Neurovascular status unchanged b/l lower extremities. Capillary fill time to digits <3 seconds b/l lower extremities. Palpable pedal pulses b/l LE. Pedal hair absent. Lower extremity skin temperature gradient within normal limits. No pain with calf compression b/l. No edema noted b/l lower extremities.  Dermatological Examination: Pedal skin is thin shiny, atrophic b/l lower extremities. No open wounds bilaterally. No interdigital macerations bilaterally. Toenails b/l lower extremities, L hallux, L 2nd toe, L 3rd toe, L 4th toe, L 5th toe, R hallux, R 3rd toe, R 4th toe and R 5th toe elongated, discolored, dystrophic, thickened, and crumbly with subungual debris and tenderness to dorsal palpation. Hyperkeratotic lesion(s) R 2nd toe.  No erythema, no edema, no drainage, no flocculence.  Musculoskeletal: Normal muscle strength 5/5 to all lower extremity muscle groups bilaterally. No pain crepitus or joint limitation noted with ROM b/l. Hammertoes noted to the R 2nd toe. Patient ambulates independent of any assistive  aids.  Neurological: Protective sensation diminished with 10g monofilament b/l. Babinski reflex negative b/l. Achilles reflex 2+ b/. Clonus negative b/l.  Assessment: 1. Pain due to onychomycosis of toenails of both feet   2. Corns   3. Acquired hammertoes of both feet   4. Diabetic peripheral neuropathy associated with type 2 diabetes mellitus ([place])     Plan: -Continue diabetic foot care principles. Literature dispensed on today.  -Continue diabetic foot care principles. -Toenails 1-5 b/l were debrided in length and girth with sterile nail nippers and dremel without iatrogenic bleeding.  -Corn(s) debrided R 2nd toe without complication or incident. Total number debrided=1.  -Patient to continue soft, supportive shoe gear daily. -Patient to report any pedal injuries to medical professional immediately. -Patient/POA to call should there be question/concern in the interim.  Return in about 3 months (around 08/20/2020).

## 2020-05-27 NOTE — Progress Notes (Signed)
[facility] - Clinic Note  05/28/2020     CHIEF COMPLAINT Patient presents for Retina Follow Up   HISTORY OF PRESENT ILLNESS: Rebecca Perkins is a 86 y.o. female who presents to the clinic today for:   HPI    Retina Follow Up    Patient presents with  Diabetic Retinopathy.  In right eye.  This started 3 months ago.  I, the attending physician,  performed the HPI with the patient and updated documentation appropriately.          Comments    Patient here for 3 month retina follow up for NPDR OD. Patient states vision doing pretty good. No eye pain. BS this am 80 A1C 7.0 or under.       Last edited by Rennis Chris, MD on 05/28/2020  2:43 PM. (History)    pt states she is doing well, her last A1c was 6.8 on 08.30.21, her blood sugar is between 75-90 in the mornings, she says she never has any problems with her BP  Referring physician: Donita Brooks, MD [address] [place],  [place] [address]  HISTORICAL INFORMATION:   Selected notes from the MEDICAL RECORD NUMBER Referred by Dr. Maris Berger for concern of DME   CURRENT MEDICATIONS: Current Outpatient Medications (Ophthalmic Drugs)  Medication Sig  . polyvinyl alcohol (LIQUIFILM TEARS) 1.4 % ophthalmic solution 1 drop as needed for dry eyes.   No current facility-administered medications for this visit. (Ophthalmic Drugs)   Current Outpatient Medications (Other)  Medication Sig  . Blood Glucose Monitoring Suppl (ONE TOUCH ULTRA SYSTEM KIT) W/DEVICE KIT 1 kit by Does not apply route once. Tests Blood sugar before meals and at bedtime  . Calcium Carbonate-Vitamin D (CALTRATE 600+D PO) Take by mouth 2 (two) times daily.  . clopidogrel (PLAVIX) 75 MG tablet Take 1 tablet (75 mg total) by mouth daily.  . Coenzyme Q10 (COQ10 PO) Take by mouth.  . Continuous Blood Gluc Sensor (FREESTYLE LIBRE 14 DAY SENSOR) MISC USE EVERY 14 DAYS  . denosumab (PROLIA) 60 MG/ML SOSY injection Inject into the  skin every 6 (six) months.   . donepezil (ARICEPT) 10 MG tablet Take 1 tablet (10 mg total) by mouth at bedtime.  . fenofibrate 160 MG tablet Take 1 tablet (160 mg total) by mouth daily.  . hydrochlorothiazide (MICROZIDE) 12.5 MG capsule TAKE 1 CAPSULE(12.5 MG) BY MOUTH DAILY  . icosapent Ethyl (VASCEPA) 1 g capsule Take 1 capsule (1 g total) by mouth 2 (two) times daily.  . insulin glargine, 1 Unit Dial, (TOUJEO SOLOSTAR) 300 UNIT/ML Solostar Pen Inject 20 Units into the skin in the morning.  . insulin lispro (HUMALOG KWIKPEN) 100 UNIT/ML KwikPen Inject into the skin. Use only if blood sugar is over 200 in the am.  . Insulin Pen Needle (B-D ULTRAFINE III SHORT PEN) 31G X 8 MM MISC INJECT AS DIRECTED 4 TIMES DAILY  . INSULIN SYRINGE 1CC/29G 29G X 1/2" 1 ML MISC AS DIRECTED.  Marland Kitchen losartan (COZAAR) 100 MG tablet Take 1 tablet (100 mg total) by mouth daily.  . metoprolol succinate (TOPROL-XL) 50 MG 24 hr tablet TAKE 1 TABLET BY MOUTH EVERY DAY WITH OR IMMEDIATELY FOLLOWING A MEAL  . Multiple Vitamin (MULTIVITAMIN) tablet Take 1 tablet by mouth daily.    . NONFORMULARY OR COMPOUNDED ITEM [facility]:  Peripheral Neuropathy Cream - Bupivacaine 1%, Doxepin 3%, Gabapentin 6%, Pentoxifylline 3%, Topiramate 1%, apply 1-2 grams to affected ares  3-4 times a day prn.  . omeprazole (PRILOSEC) 20 MG capsule TAKE 1 CAPSULE BY MOUTH DAILY  . ONE TOUCH ULTRA TEST test strip USE FOUR TIMES DAILY BEFORE MEALS AND EVERY NIGHT AT BEDTIME  . pioglitazone (ACTOS) 30 MG tablet Take 1 tablet (30 mg total) by mouth daily.  . Pyridoxine HCl (VITAMIN B-6) 100 MG tablet Take 100 mg by mouth daily.    . rosuvastatin (CRESTOR) 20 MG tablet TAKE 1 TABLET(20 MG) BY MOUTH DAILY  . TRULICITY 1.5 WE/3.1VQ SOPN INJECT 1.$RemoveBefor'5MG'PSngOKmbrUod$  UNDER THE SKIN ONCE A WEEK   Current Facility-Administered Medications (Other)  Medication Route  . denosumab (PROLIA) injection 60 mg Subcutaneous      REVIEW OF SYSTEMS: ROS    Positive for:  Gastrointestinal, Neurological, Genitourinary, Musculoskeletal, HENT, Endocrine, Eyes   Negative for: Constitutional, Skin, Cardiovascular, Respiratory, Psychiatric, Allergic/Imm, Heme/Lymph   Last edited by Theodore Demark, COA on 05/28/2020  1:41 PM. (History)       ALLERGIES Allergies  Allergen Reactions  . Lipitor [Atorvastatin Calcium] Diarrhea  . Niacin And Related Hives  . Statins Diarrhea  . Macrobid [Nitrofurantoin Macrocrystal] Other (See Comments)    unknown    PAST MEDICAL HISTORY Past Medical History:  Diagnosis Date  . Anemia   . Diabetes mellitus type 2, insulin dependent ([employer])   . Diabetic retinopathy ([place])    NPDR OU  . Dyslipidemia   . GERD (gastroesophageal reflux disease)   . Hypertension   . Hypertensive retinopathy    OU  . Migraines   . Osteoporosis   . PVD (peripheral vascular disease) ([place])    abi .83 (L), .92 (R)  . RSD (reflex sympathetic dystrophy) 2007   R wrist/hand following fx  . Stroke ([place])   . TIA (transient ischemic attack)   . Varicose veins    Past Surgical History:  Procedure Laterality Date  . ABDOMINAL HYSTERECTOMY  1988  . APPENDECTOMY  1966  . BREAST CYST EXCISION    . CATARACT EXTRACTION Bilateral 10/2009  . CHOLECYSTECTOMY  1989  . EYE SURGERY Bilateral    Cat Sx OU  . Several benign cyst removed     last 1 in 1972  . TUBAL LIGATION    . UMBILICAL HERNIA REPAIR      FAMILY HISTORY Family History  Problem Relation Age of Onset  . Stroke Mother 35  . Hypertension Mother   . Clotting disorder Father   . Heart attack Father   . Arrhythmia Sister   . Stroke Brother   . Thyroid disease Neg Hx     SOCIAL HISTORY Social History   Tobacco Use  . Smoking status: Never Smoker  . Smokeless tobacco: Never Used  Substance Use Topics  . Alcohol use: No  . Drug use: No         OPHTHALMIC EXAM:  Base Eye Exam    Visual Acuity (Snellen - Linear)      Right Left   Dist [place] 20/30 20/25 -1   Dist ph [place] 20/20  -1 20/20 -1       Tonometry (Tonopen, 1:38 PM)      Right Left   Pressure 14 15       Pupils      Dark Light Shape React APD   Right 3 2 Round Brisk None   Left 3 2 Round Brisk None       Visual Fields (Counting fingers)      Left Right  Full Full       Extraocular Movement      Right Left    Full, Ortho Full, Ortho       Neuro/Psych    Oriented x3: Yes   Mood/Affect: Normal       Dilation    Both eyes: 1.0% Mydriacyl, 2.5% Phenylephrine @ 1:38 PM        Slit Lamp and Fundus Exam    Slit Lamp Exam      Right Left   Lids/Lashes Meibomian gland dysfunction, Dermatochalasis - upper lid Meibomian gland dysfunction, Dermatochalasis - upper lid   Conjunctiva/Sclera White and quiet White and quiet   Cornea 2+ central Punctate epithelial erosions, Arcus, dry tear film, 2+ Guttata irregular tear film and epithelium, 1-2+ Punctate epithelial erosions, Arcus, 1+ Guttata   Anterior Chamber Deep and quiet Deep and quiet   Iris Round and dilated, No NVI Round and dilated, No NVI   Lens PCIOL in good position with open PC PCIOL in good position with open PC   Vitreous mild Vitreous syneresis mild Vitreous syneresis       Fundus Exam      Right Left   Disc Pink and Sharp trace Pallor, Sharp rim   C/D Ratio 0.4 0.6   Macula Flat, Blunted foveal reflex, scattered Microaneurysms/IRH, cluster of IRH and exudate just inside ST arcades, light focal laser scars ST macula Flat, blunted foveal reflex, scattered MA, no edema   Vessels Vascular attenuation, mild Tortuousity, no NVE Vascular attenuation, mild Tortuousity, no NVE   Periphery Attached, scattered MA/IRH greatest posteriorly Attached, scattered MA/IRH greatest posteriorly          IMAGING AND PROCEDURES  Imaging and Procedures for $RemoveBefore'@TODAY'[id number]$ @  OCT, Retina - OU - Both Eyes       Right Eye Quality was good. Central Foveal Thickness: 281. Progression has been stable. Findings include normal foveal contour, no SRF,  intraretinal hyper-reflective material, intraretinal fluid (Mild persistent non-central cystic changes ).   Left Eye Quality was good. Central Foveal Thickness: 273. Progression has been stable. Findings include normal foveal contour, no IRF, no SRF.   Notes *Images captured and stored on drive  Diagnosis / Impression:  OD: Mild persistent non-central cystic changes  OS: no DME  Clinical management:  See below  Abbreviations: NFP - Normal foveal profile. CME - cystoid macular edema. PED - pigment epithelial detachment. IRF - intraretinal fluid. SRF - subretinal fluid. EZ - ellipsoid zone. ERM - epiretinal membrane. ORA - outer retinal atrophy. ORT - outer retinal tubulation. SRHM - subretinal hyper-reflective material                 ASSESSMENT/PLAN:    ICD-10-CM   1. Moderate nonproliferative diabetic retinopathy of right eye with macular edema associated with type 2 diabetes mellitus ([place])  Q46.9629   2. Moderate nonproliferative diabetic retinopathy of left eye without macular edema associated with type 2 diabetes mellitus ([place])  B28.4132   3. Retinal edema  H35.81 OCT, Retina - OU - Both Eyes  4. Essential hypertension  I10   5. Hypertensive retinopathy of both eyes  H35.033   6. Pseudophakia of both eyes  Z96.1     1-3. Moderate nonproliferative diabetic retinopathy w/ DME, OD  - OS without DME  - A1c was 6.8 on 08.30.21  - FA (07.14.20 and 7.19.21) shows late leaking MA OU; OD with focal hyperfluorescent leakage in area of DME  - s/p focal laser OD (11.09.20)  -  OCT mild persistent non-central cystic changes OD; OS no DME  - BCVA  20/20 OU  - exam shows scattered MA/IRH OU but no obvious targets for focal laser OD  - discussed treatments for DME -- pt wishes to hold off on intervention today -- reasonable  - f/u 4 months, DFE, OCT  4,5. Hypertensive retinopathy OU  - discussed importance of tight BP control  - monitor  6. Pseudophakia OU  - s/p CE/IOL OU  (Dr. Albina Billet)  - beautiful surgeries, doing well  - monitor   Ophthalmic Meds Ordered this visit:  No orders of the defined types were placed in this encounter.      Return in about 4 months (around 09/28/2020) for f/u NPDR OU, DFE, OCT.  There are no Patient Instructions on file for this visit.   Explained the diagnoses, plan, and follow up with the patient and they expressed understanding.  Patient expressed understanding of the importance of proper follow up care.   This document serves as a record of services personally performed by Gardiner Sleeper, MD, PhD. It was created on their behalf by Roselee Nova, COMT. The creation of this record is the provider's dictation and/or activities during the visit.  Electronically signed by: Roselee Nova, COMT 05/29/20 9:40 AM  Gardiner Sleeper, M.D., Ph.D. Diseases & Surgery of the Retina and Vitreous [facility]  I have reviewed the above documentation for accuracy and completeness, and I agree with the above. Gardiner Sleeper, M.D., Ph.D. 05/29/20 9:40 AM   Abbreviations: M myopia (nearsighted); A astigmatism; H hyperopia (farsighted); P presbyopia; Mrx spectacle prescription;  CTL contact lenses; OD right eye; OS left eye; OU both eyes  XT exotropia; ET esotropia; PEK punctate epithelial keratitis; PEE punctate epithelial erosions; DES dry eye syndrome; MGD meibomian gland dysfunction; ATs artificial tears; PFAT's preservative free artificial tears; [place] nuclear sclerotic cataract; PSC posterior subcapsular cataract; ERM epi-retinal membrane; PVD posterior vitreous detachment; RD retinal detachment; DM diabetes mellitus; DR diabetic retinopathy; NPDR non-proliferative diabetic retinopathy; PDR proliferative diabetic retinopathy; CSME clinically significant macular edema; DME diabetic macular edema; dbh dot blot hemorrhages; CWS cotton wool spot; POAG primary open angle glaucoma; C/D cup-to-disc ratio; HVF humphrey visual  field; GVF goldmann visual field; OCT optical coherence tomography; IOP intraocular pressure; BRVO Branch retinal vein occlusion; CRVO central retinal vein occlusion; CRAO central retinal artery occlusion; BRAO branch retinal artery occlusion; RT retinal tear; SB scleral buckle; PPV pars plana vitrectomy; VH Vitreous hemorrhage; PRP panretinal laser photocoagulation; IVK intravitreal kenalog; VMT vitreomacular traction; MH Macular hole;  NVD neovascularization of the disc; NVE neovascularization elsewhere; AREDS age related eye disease study; ARMD age related macular degeneration; POAG primary open angle glaucoma; EBMD epithelial/anterior basement membrane dystrophy; ACIOL anterior chamber intraocular lens; IOL intraocular lens; PCIOL posterior chamber intraocular lens; Phaco/IOL phacoemulsification with intraocular lens placement; [place] photorefractive keratectomy; LASIK laser assisted in situ keratomileusis; HTN hypertension; DM diabetes mellitus; COPD chronic obstructive pulmonary disease

## 2020-05-28 ENCOUNTER — Ambulatory Visit (INDEPENDENT_AMBULATORY_CARE_PROVIDER_SITE_OTHER): Payer: Medicare PPO | Admitting: Ophthalmology

## 2020-05-28 ENCOUNTER — Encounter (INDEPENDENT_AMBULATORY_CARE_PROVIDER_SITE_OTHER): Payer: Self-pay | Admitting: Ophthalmology

## 2020-05-28 ENCOUNTER — Other Ambulatory Visit: Payer: Self-pay

## 2020-05-28 DIAGNOSIS — E113392 Type 2 diabetes mellitus with moderate nonproliferative diabetic retinopathy without macular edema, left eye: Secondary | ICD-10-CM | POA: Diagnosis not present

## 2020-05-28 DIAGNOSIS — I10 Essential (primary) hypertension: Secondary | ICD-10-CM

## 2020-05-28 DIAGNOSIS — H3581 Retinal edema: Secondary | ICD-10-CM | POA: Diagnosis not present

## 2020-05-28 DIAGNOSIS — E113311 Type 2 diabetes mellitus with moderate nonproliferative diabetic retinopathy with macular edema, right eye: Secondary | ICD-10-CM | POA: Diagnosis not present

## 2020-05-28 DIAGNOSIS — Z961 Presence of intraocular lens: Secondary | ICD-10-CM

## 2020-05-28 DIAGNOSIS — H35033 Hypertensive retinopathy, bilateral: Secondary | ICD-10-CM

## 2020-05-29 ENCOUNTER — Encounter (INDEPENDENT_AMBULATORY_CARE_PROVIDER_SITE_OTHER): Payer: Self-pay | Admitting: Ophthalmology

## 2020-05-29 ENCOUNTER — Telehealth: Payer: Self-pay

## 2020-05-29 ENCOUNTER — Other Ambulatory Visit: Payer: Self-pay | Admitting: Family Medicine

## 2020-05-29 MED ORDER — ICOSAPENT ETHYL 1 G PO CAPS: 2.0000 g | ORAL_CAPSULE | Freq: Two times a day (BID) | ORAL | 11 refills | Status: DC

## 2020-05-29 NOTE — Telephone Encounter (Signed)
Pt son [name] stopped by and wants to know if his mother is taking Vascepa  twice daily or is it 4 times daily? He's very agitated, said he doesn't want to have to come back up here ! If you would please give him a call, that is his request.

## 2020-05-30 NOTE — Telephone Encounter (Signed)
Pt son called for further information about medication dosage

## 2020-07-02 ENCOUNTER — Other Ambulatory Visit: Payer: Self-pay

## 2020-07-02 MED ORDER — FREESTYLE LIBRE 14 DAY SENSOR MISC
3 refills | Status: DC
Start: 2020-07-02 — End: 2021-10-12

## 2020-07-07 ENCOUNTER — Telehealth: Payer: Self-pay | Admitting: Endocrinology

## 2020-07-07 ENCOUNTER — Other Ambulatory Visit: Payer: Self-pay | Admitting: Endocrinology

## 2020-07-07 NOTE — Telephone Encounter (Signed)
Noted  

## 2020-07-07 NOTE — Telephone Encounter (Signed)
Patient's son called to request that we  DO NOT [place] ever again.  Dr Dennard Schaumann takes care of all her medications.  Per son they have left this practice

## 2020-07-07 NOTE — Telephone Encounter (Signed)
Please see below.

## 2020-07-08 ENCOUNTER — Other Ambulatory Visit: Payer: Self-pay

## 2020-07-08 MED ORDER — METOPROLOL SUCCINATE ER 50 MG PO TB24: ORAL_TABLET | ORAL | 1 refills | Status: DC

## 2020-07-20 ENCOUNTER — Other Ambulatory Visit: Payer: Self-pay | Admitting: Family Medicine

## 2020-08-11 ENCOUNTER — Telehealth: Payer: Self-pay

## 2020-08-11 NOTE — Telephone Encounter (Signed)
Patient son called for appt. Patient scheduled telephone visit for persistent  cough

## 2020-08-12 ENCOUNTER — Telehealth: Payer: Self-pay

## 2020-08-12 ENCOUNTER — Other Ambulatory Visit: Payer: Self-pay

## 2020-08-12 ENCOUNTER — Telehealth (INDEPENDENT_AMBULATORY_CARE_PROVIDER_SITE_OTHER): Payer: Medicare Other | Admitting: Family Medicine

## 2020-08-12 DIAGNOSIS — R059 Cough, unspecified: Secondary | ICD-10-CM

## 2020-08-12 DIAGNOSIS — M7989 Other specified soft tissue disorders: Secondary | ICD-10-CM

## 2020-08-12 MED ORDER — PANTOPRAZOLE SODIUM 40 MG PO TBEC
40.0000 mg | DELAYED_RELEASE_TABLET | Freq: Every day | ORAL | 3 refills | Status: DC
Start: 2020-08-12 — End: 2020-11-03

## 2020-08-12 MED ORDER — FLUTICASONE PROPIONATE 50 MCG/ACT NA SUSP: 2.0000 | Freq: Every day | NASAL | 6 refills | Status: DC

## 2020-08-12 NOTE — Progress Notes (Signed)
Subjective:    Patient ID: Rebecca Perkins, female    DOB: 04-14-1929, 87 y.o.   MRN: [id number]  HPI  Patient is a very pleasant 87 year old Caucasian female who is being seen today as a telephone visit.  Her son provides all of the history due to dementia.  Both the patient and her son are at home.  I am currently in my office.  They consented to be seen via telephone phone call began at 912.  Phone call concluded at 925.  Patient has had a persistent cough now for more than 4 months.  Son describes it as a nonlabored cough.  Is more of a "clearing her throat cough".  She is frequently clearing her throat.  The cough tends to get worse after she eats.  However her signs do not feel that she is aspirating.  She does not get choked when she is swallowing.  She does not struggle to swallow.  She denies any pain when she swallows.  It also tends to get worse when she lays down.  It has been going on for months.  However around the same time, her sons noticed increased leg swelling and again the cough is worse when she lies down at night.  She is on Actos for diabetes.  In 2019 she had an echocardiogram that showed an ejection fraction of 60 to 65% although she did have grade 2 diastolic dysfunction.  They deny any fever.  They deny any chills.  They deny any hemoptysis.  She does have constant rhinorrhea.  She is also on omeprazole but only 20 mg a day for acid reflux Past Medical History:  Diagnosis Date  . Anemia   . Diabetes mellitus type 2, insulin dependent ([place])   . Diabetic retinopathy ([place])    NPDR OU  . Dyslipidemia   . GERD (gastroesophageal reflux disease)   . Hypertension   . Hypertensive retinopathy    OU  . Migraines   . Osteoporosis   . PVD (peripheral vascular disease) ([place])    abi .83 (L), .92 (R)  . RSD (reflex sympathetic dystrophy) 2007   R wrist/hand following fx  . Stroke ([place])   . TIA (transient ischemic attack)   . Varicose veins    Past Surgical History:   Procedure Laterality Date  . ABDOMINAL HYSTERECTOMY  1988  . APPENDECTOMY  1966  . BREAST CYST EXCISION    . CATARACT EXTRACTION Bilateral 10/2009  . CHOLECYSTECTOMY  1989  . EYE SURGERY Bilateral    Cat Sx OU  . Several benign cyst removed     last 1 in 1972  . TUBAL LIGATION    . UMBILICAL HERNIA REPAIR     Current Outpatient Medications on File Prior to Visit  Medication Sig Dispense Refill  . Blood Glucose Monitoring Suppl (ONE TOUCH ULTRA SYSTEM KIT) W/DEVICE KIT 1 kit by Does not apply route once. Tests Blood sugar before meals and at bedtime 1 each 0  . Calcium Carbonate-Vitamin D (CALTRATE 600+D PO) Take by mouth 2 (two) times daily.    . clopidogrel (PLAVIX) 75 MG tablet Take 1 tablet (75 mg total) by mouth daily. 90 tablet 3  . Coenzyme Q10 (COQ10 PO) Take by mouth.    . Continuous Blood Gluc Sensor (FREESTYLE LIBRE 14 DAY SENSOR) MISC USE EVERY 14 DAYS 6 each 3  . denosumab (PROLIA) 60 MG/ML SOSY injection Inject into the skin every 6 (six) months.     . donepezil (  ARICEPT) 10 MG tablet Take 1 tablet (10 mg total) by mouth at bedtime. 90 tablet 3  . fenofibrate 160 MG tablet Take 1 tablet (160 mg total) by mouth daily. 90 tablet 3  . hydrochlorothiazide (MICROZIDE) 12.5 MG capsule TAKE 1 CAPSULE(12.5 MG) BY MOUTH DAILY 90 capsule 3  . icosapent Ethyl (VASCEPA) 1 g capsule Take 2 capsules (2 g total) by mouth 2 (two) times daily. 120 capsule 11  . insulin glargine, 1 Unit Dial, (TOUJEO SOLOSTAR) 300 UNIT/ML Solostar Pen Inject 20 Units into the skin in the morning. 3 pen 3  . insulin lispro (HUMALOG KWIKPEN) 100 UNIT/ML KwikPen Inject into the skin. Use only if blood sugar is over 200 in the am.    . Insulin Pen Needle (B-D ULTRAFINE III SHORT PEN) 31G X 8 MM MISC INJECT AS DIRECTED 4 TIMES DAILY 300 each 2  . INSULIN SYRINGE 1CC/29G 29G X 1/2" 1 ML MISC AS DIRECTED. 100 each 3  . losartan (COZAAR) 100 MG tablet Take 1 tablet (100 mg total) by mouth daily. 90 tablet 3  .  metoprolol succinate (TOPROL-XL) 50 MG 24 hr tablet TAKE 1 TABLET BY MOUTH EVERY DAY WITH OR IMMEDIATELY FOLLOWING A MEAL 90 tablet 1  . Multiple Vitamin (MULTIVITAMIN) tablet Take 1 tablet by mouth daily.      . NONFORMULARY OR COMPOUNDED ITEM [facility]:  Peripheral Neuropathy Cream - Bupivacaine 1%, Doxepin 3%, Gabapentin 6%, Pentoxifylline 3%, Topiramate 1%, apply 1-2 grams to affected ares 3-4 times a day prn. 100 each 5  . omeprazole (PRILOSEC) 20 MG capsule TAKE 1 CAPSULE BY MOUTH DAILY 90 capsule 3  . ONE TOUCH ULTRA TEST test strip USE FOUR TIMES DAILY BEFORE MEALS AND EVERY NIGHT AT BEDTIME 150 each 5  . pioglitazone (ACTOS) 30 MG tablet TAKE 1 TABLET BY MOUTH EVERY DAY 90 tablet 3  . polyvinyl alcohol (LIQUIFILM TEARS) 1.4 % ophthalmic solution 1 drop as needed for dry eyes.    . Pyridoxine HCl (VITAMIN B-6) 100 MG tablet Take 100 mg by mouth daily.      . rosuvastatin (CRESTOR) 20 MG tablet TAKE 1 TABLET(20 MG) BY MOUTH DAILY 90 tablet 3  . TRULICITY 1.5 XB/2.6OM SOPN INJECT 1.$RemoveBefor'5MG'[id number]$  (1 PEN) UNDER THE SKIN ONCE A WEEK 2 mL 3   Current Facility-Administered Medications on File Prior to Visit  Medication Dose Route Frequency Provider Last Rate Last Admin  . denosumab (PROLIA) injection 60 mg  60 mg Subcutaneous Q6 months [name], MD   60 mg at 04/28/20 1200   Allergies  Allergen Reactions  . Lipitor [Atorvastatin Calcium] Diarrhea  . Niacin And Related Hives  . Statins Diarrhea  . Macrobid [Nitrofurantoin Macrocrystal] Other (See Comments)    unknown   Social History   Socioeconomic History  . Marital status: Widowed    Spouse name: Not on file  . Number of children: 3  . Years of education: Not on file  . Highest education level: Not on file  Occupational History  . Occupation: Retired  Tobacco Use  . Smoking status: Never Smoker  . Smokeless tobacco: Never Used  Substance and Sexual Activity  . Alcohol use: No  . Drug use: No  . Sexual activity: Not  on file  Other Topics Concern  . 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      Objective:   Physical Exam        Assessment & Plan:  Cough - Plan: ECHOCARDIOGRAM COMPLETE, DG Chest 2 View, fluticasone (FLONASE) 50 MCG/ACT nasal spray, pantoprazole (PROTONIX) 40 MG tablet  Leg swelling - Plan: ECHOCARDIOGRAM COMPLETE, DG Chest 2 View, fluticasone (FLONASE) 50 MCG/ACT nasal spray, pantoprazole (PROTONIX) 40 MG tablet  First, however obtain a chest x-ray.  This is to evaluate for any pulmonary edema or infiltrate or other structural problems in the lungs.  The son will take her today to get a chest x-ray but I believe this is prudent given the fact the cough is gone on now for more than 4 months.  Second I will obtain an echocardiogram to evaluate for systolic failure particular given the leg swelling and the orthopnea.  If she does have a weakened heart, we will need to stop Actos and potentially start her on Lasix in place of hydrochlorothiazide.  However I suspect that the cough is more of an irritant likely due either to acid reflux or postnasal drip.  Therefore I would start her on Protonix 40 mg a day and discontinue omeprazole.  I will also add Flonase 2 sprays each nostril daily for postnasal drip and then reassess to see if the cough is getting better in 2 to 3 weeks if the chest x-ray is normal.  Await the results of echocardiogram

## 2020-08-13 ENCOUNTER — Ambulatory Visit
Admission: RE | Admit: 2020-08-13 | Discharge: 2020-08-13 | Disposition: A | Discharge status: Home / Self Care | Payer: Medicare PPO | Source: Ambulatory Visit | Attending: Family Medicine | Admitting: Family Medicine

## 2020-08-13 DIAGNOSIS — R059 Cough, unspecified: Secondary | ICD-10-CM | POA: Diagnosis not present

## 2020-08-13 DIAGNOSIS — M7989 Other specified soft tissue disorders: Secondary | ICD-10-CM

## 2020-08-14 ENCOUNTER — Other Ambulatory Visit: Payer: Medicare Other

## 2020-08-14 ENCOUNTER — Other Ambulatory Visit: Payer: Self-pay

## 2020-08-14 ENCOUNTER — Other Ambulatory Visit: Payer: Self-pay | Admitting: *Deleted

## 2020-08-14 DIAGNOSIS — Z20822 Contact with and (suspected) exposure to covid-19: Secondary | ICD-10-CM

## 2020-08-14 NOTE — Progress Notes (Signed)
Pt came in for covid swab. Pt told to call once she was outside. Pt did come in the office due to having to use the rr. Swab conducted in office

## 2020-08-16 LAB — SARS-COV-2 RNA,(COVID-19) QUALITATIVE NAAT: SARS CoV2 RNA: NOT DETECTED

## 2020-08-22 ENCOUNTER — Other Ambulatory Visit: Payer: Self-pay | Admitting: Family Medicine

## 2020-08-25 ENCOUNTER — Ambulatory Visit (INDEPENDENT_AMBULATORY_CARE_PROVIDER_SITE_OTHER): Payer: Medicare Other | Admitting: Podiatry

## 2020-08-25 ENCOUNTER — Encounter: Payer: Self-pay | Admitting: Podiatry

## 2020-08-25 ENCOUNTER — Other Ambulatory Visit: Payer: Self-pay

## 2020-08-25 DIAGNOSIS — M2042 Other hammer toe(s) (acquired), left foot: Secondary | ICD-10-CM

## 2020-08-25 DIAGNOSIS — M79675 Pain in left toe(s): Secondary | ICD-10-CM

## 2020-08-25 DIAGNOSIS — B351 Tinea unguium: Secondary | ICD-10-CM

## 2020-08-25 DIAGNOSIS — E1142 Type 2 diabetes mellitus with diabetic polyneuropathy: Secondary | ICD-10-CM

## 2020-08-25 DIAGNOSIS — L84 Corns and callosities: Secondary | ICD-10-CM | POA: Diagnosis not present

## 2020-08-25 DIAGNOSIS — M79674 Pain in right toe(s): Secondary | ICD-10-CM | POA: Diagnosis not present

## 2020-08-25 DIAGNOSIS — M2041 Other hammer toe(s) (acquired), right foot: Secondary | ICD-10-CM

## 2020-08-29 ENCOUNTER — Telehealth: Payer: Self-pay | Admitting: *Deleted

## 2020-08-29 NOTE — Telephone Encounter (Signed)
Received request from pharmacy for [place] on Vascepa.   PA submitted.   Dx: E78.1- hypertriglyceridemia.   Your PA request has been sent to [facility]. Waiting 1 minute for a real-time reply.

## 2020-08-29 NOTE — Telephone Encounter (Signed)
Your information has been submitted to [employer]. To check for an updated outcome later, reopen this PA request from your dashboard. If [employer] has not responded to your request within 24 hours, contact [employer] at [phone]. If you think there may be a problem with your PA request, use our live chat feature at the bottom right.    

## 2020-08-30 NOTE — Progress Notes (Signed)
Subjective: Rebecca Perkins presents today for follow up of at risk foot care. Pt has h/o NIDDM with chronic kidney disease and corn(s) right 2nd and painful thick toenails that are difficult to trim. Painful toenails interfere with ambulation. Aggravating factors include wearing enclosed shoe gear. Pain is relieved with periodic professional debridement. Painful corns are aggravated when weightbearing when wearing enclosed shoe gear. Pain is relieved with periodic professional debridement.   She voices no new pedal concerns on today's visit.  PCP is Dr. Jenna Luo and last visit was 08/12/2020 via telemedicine. She is also followed by Dr. Elayne Snare for her diabetes and last visit was 05/22/2019.  Allergies  Allergen Reactions  . Lipitor [Atorvastatin Calcium] Diarrhea  . Niacin And Related Hives  . Statins Diarrhea  . Macrobid [Nitrofurantoin Macrocrystal] Other (See Comments)    unknown     Objective: There were no vitals filed for this visit.  Pt is a pleasant 87 y.o. year old Caucasian  female in NAD. AAO x 3.   Vascular Examination:  Neurovascular status unchanged b/l lower extremities. Capillary fill time to digits <3 seconds b/l lower extremities. Palpable pedal pulses b/l LE. Pedal hair absent. Lower extremity skin temperature gradient within normal limits. No pain with calf compression b/l. No edema noted b/l lower extremities.  Dermatological Examination: Pedal skin is thin shiny, atrophic b/l lower extremities. No open wounds bilaterally. No interdigital macerations bilaterally. Toenails b/l lower extremities, L hallux, L 2nd toe, L 3rd toe, L 4th toe, L 5th toe, R hallux, R 3rd toe, R 4th toe and R 5th toe elongated, discolored, dystrophic, thickened, and crumbly with subungual debris and tenderness to dorsal palpation. Hyperkeratotic lesion(s) R 2nd toe.  No erythema, no edema, no drainage, no flocculence.  Musculoskeletal: Normal muscle strength 5/5 to all lower  extremity muscle groups bilaterally. No pain crepitus or joint limitation noted with ROM b/l. Hammertoes noted to the R 2nd toe. Patient ambulates independent of any assistive aids.  Neurological: Protective sensation diminished with 10g monofilament b/l. Babinski reflex negative b/l. Achilles reflex 2+ b/. Clonus negative b/l.  Assessment: 1. Pain due to onychomycosis of toenails of both feet   2. Corns   3. Acquired hammertoes of both feet   4. Diabetic peripheral neuropathy associated with type 2 diabetes mellitus ([place])     Plan:  -Continue diabetic foot care principles. -Toenails 1-5 b/l were debrided in length and girth with sterile nail nippers and dremel without iatrogenic bleeding.  -Corn(s) debrided R 2nd toe without complication or incident. Total number debrided=1.  -Patient to continue soft, supportive shoe gear daily. -Patient to report any pedal injuries to medical professional immediately. -Patient/POA to call should there be question/concern in the interim.  Return in about 3 months (around 11/23/2020).

## 2020-09-01 NOTE — Telephone Encounter (Signed)
Received PA determination.   PA approved 08/30/2020- 08/31/2023.

## 2020-09-11 ENCOUNTER — Other Ambulatory Visit: Payer: Self-pay

## 2020-09-11 ENCOUNTER — Ambulatory Visit (HOSPITAL_COMMUNITY): Payer: Medicare Other | Attending: Cardiology

## 2020-09-11 DIAGNOSIS — M7989 Other specified soft tissue disorders: Secondary | ICD-10-CM | POA: Insufficient documentation

## 2020-09-11 DIAGNOSIS — E785 Hyperlipidemia, unspecified: Secondary | ICD-10-CM | POA: Insufficient documentation

## 2020-09-11 DIAGNOSIS — R600 Localized edema: Secondary | ICD-10-CM | POA: Insufficient documentation

## 2020-09-11 DIAGNOSIS — E119 Type 2 diabetes mellitus without complications: Secondary | ICD-10-CM | POA: Insufficient documentation

## 2020-09-11 DIAGNOSIS — R059 Cough, unspecified: Secondary | ICD-10-CM | POA: Diagnosis not present

## 2020-09-11 DIAGNOSIS — R42 Dizziness and giddiness: Secondary | ICD-10-CM | POA: Diagnosis not present

## 2020-09-11 DIAGNOSIS — I119 Hypertensive heart disease without heart failure: Secondary | ICD-10-CM | POA: Diagnosis not present

## 2020-09-11 DIAGNOSIS — R0601 Orthopnea: Secondary | ICD-10-CM | POA: Insufficient documentation

## 2020-09-11 LAB — ECHOCARDIOGRAM COMPLETE
AR max vel: 1.25 cm2
AV Area VTI: 1.15 cm2
AV Area mean vel: 1.05 cm2
AV Mean grad: 8 mmHg
AV Peak grad: 15 mmHg
Ao pk vel: 1.93 m/s
Area-P 1/2: 3.19 cm2
S' Lateral: 2.2 cm

## 2020-09-17 ENCOUNTER — Encounter (HOSPITAL_COMMUNITY): Payer: Self-pay

## 2020-09-17 ENCOUNTER — Other Ambulatory Visit: Payer: Self-pay

## 2020-09-17 ENCOUNTER — Emergency Department (HOSPITAL_COMMUNITY)
Admission: EM | Admit: 2020-09-17 | Discharge: 2020-09-18 | Disposition: A | Discharge status: Home / Self Care | Payer: Medicare Other | Attending: Emergency Medicine | Admitting: Emergency Medicine

## 2020-09-17 ENCOUNTER — Emergency Department (HOSPITAL_COMMUNITY): Payer: Medicare Other

## 2020-09-17 DIAGNOSIS — R600 Localized edema: Secondary | ICD-10-CM | POA: Diagnosis not present

## 2020-09-17 DIAGNOSIS — R42 Dizziness and giddiness: Secondary | ICD-10-CM | POA: Diagnosis not present

## 2020-09-17 DIAGNOSIS — S0990XA Unspecified injury of head, initial encounter: Secondary | ICD-10-CM

## 2020-09-17 DIAGNOSIS — I6523 Occlusion and stenosis of bilateral carotid arteries: Secondary | ICD-10-CM | POA: Diagnosis not present

## 2020-09-17 DIAGNOSIS — E1122 Type 2 diabetes mellitus with diabetic chronic kidney disease: Secondary | ICD-10-CM | POA: Diagnosis not present

## 2020-09-17 DIAGNOSIS — R9431 Abnormal electrocardiogram [ECG] [EKG]: Secondary | ICD-10-CM | POA: Diagnosis not present

## 2020-09-17 DIAGNOSIS — Z79899 Other long term (current) drug therapy: Secondary | ICD-10-CM | POA: Insufficient documentation

## 2020-09-17 DIAGNOSIS — W19XXXA Unspecified fall, initial encounter: Secondary | ICD-10-CM

## 2020-09-17 DIAGNOSIS — N183 Chronic kidney disease, stage 3 unspecified: Secondary | ICD-10-CM | POA: Diagnosis not present

## 2020-09-17 DIAGNOSIS — Z794 Long term (current) use of insulin: Secondary | ICD-10-CM | POA: Insufficient documentation

## 2020-09-17 DIAGNOSIS — S0003XA Contusion of scalp, initial encounter: Secondary | ICD-10-CM | POA: Insufficient documentation

## 2020-09-17 DIAGNOSIS — W01198A Fall on same level from slipping, tripping and stumbling with subsequent striking against other object, initial encounter: Secondary | ICD-10-CM | POA: Insufficient documentation

## 2020-09-17 DIAGNOSIS — I129 Hypertensive chronic kidney disease with stage 1 through stage 4 chronic kidney disease, or unspecified chronic kidney disease: Secondary | ICD-10-CM | POA: Diagnosis not present

## 2020-09-17 DIAGNOSIS — F0390 Unspecified dementia without behavioral disturbance: Secondary | ICD-10-CM | POA: Diagnosis not present

## 2020-09-17 DIAGNOSIS — G9389 Other specified disorders of brain: Secondary | ICD-10-CM | POA: Diagnosis not present

## 2020-09-17 LAB — CBC
HCT: 34.4 % — ABNORMAL LOW (ref 36.0–46.0)
Hemoglobin: 11.2 g/dL — ABNORMAL LOW (ref 12.0–15.0)
MCH: 29.2 pg (ref 26.0–34.0)
MCHC: 32.6 g/dL (ref 30.0–36.0)
MCV: 89.8 fL (ref 80.0–100.0)
Platelets: 238 10*3/uL (ref 150–400)
RBC: 3.83 MIL/uL — ABNORMAL LOW (ref 3.87–5.11)
RDW: 13.7 % (ref 11.5–15.5)
WBC: 6.4 10*3/uL (ref 4.0–10.5)
nRBC: 0 % (ref 0.0–0.2)

## 2020-09-17 LAB — BASIC METABOLIC PANEL
Anion gap: 7 (ref 5–15)
BUN: 17 mg/dL (ref 8–23)
CO2: 27 mmol/L (ref 22–32)
Calcium: 9.6 mg/dL (ref 8.9–10.3)
Chloride: 106 mmol/L (ref 98–111)
Creatinine, Ser: 1.26 mg/dL — ABNORMAL HIGH (ref 0.44–1.00)
GFR, Estimated: 41 mL/min — ABNORMAL LOW (ref >60)
Glucose, Bld: 144 mg/dL — ABNORMAL HIGH (ref 70–99)
Potassium: 3 mmol/L — ABNORMAL LOW (ref 3.5–5.1)
Sodium: 140 mmol/L (ref 135–145)

## 2020-09-17 NOTE — ED Notes (Signed)
Patient transported to CT 

## 2020-09-17 NOTE — ED Triage Notes (Signed)
Pt to er, pt states that she was watching tv, states that she got up to turn a light on, got dizzy and missed the light and fell forward into an entertainment center.  Pt states that she has been a little dizzy for the past week.

## 2020-09-17 NOTE — ED Provider Notes (Signed)
[facility] EMERGENCY DEPARTMENT Provider Note   CSN: [id number] Arrival date & time: 09/17/20  2130     History Chief Complaint  Patient presents with  . Head Injury    Rebecca Perkins is a 87 y.o. female.  Patient is an 87 year old female with history of diabetes, anemia, GERD, dementia.  Patient presents today for evaluation of fall.  Patient states that she slid up, became somewhat dizzy, then fell and struck her head on the wall.  She has a contusion, pain, and swelling to the top of her head.  She denies any neck pain.  She denies having loss consciousness.  She denies any visual disturbances.  She denies any pain in the extremities or other injury.  The history is provided by the patient.  Head Injury Location:  Generalized Time since incident:  3 hours Mechanism of injury: fall   Fall:    Impact surface:  Wall   Point of impact:  Head Pain details:    Quality:  Dull   Timing:  Constant Chronicity:  New Relieved by:  Nothing Worsened by:  Nothing Ineffective treatments:  None tried      Past Medical History:  Diagnosis Date  . Anemia   . Diabetes mellitus type 2, insulin dependent ([place])   . Diabetic retinopathy ([place])    NPDR OU  . Dyslipidemia   . GERD (gastroesophageal reflux disease)   . Hypertension   . Hypertensive retinopathy    OU  . Migraines   . Osteoporosis   . PVD (peripheral vascular disease) ([place])    abi .83 (L), .92 (R)  . RSD (reflex sympathetic dystrophy) 2007   R wrist/hand following fx  . Stroke ([place])   . TIA (transient ischemic attack)   . Varicose veins     Patient Active Problem List   Diagnosis Date Noted  . Bilateral impacted cerumen 02/22/2018  . Conductive hearing loss, bilateral 02/22/2018  . HTN (hypertension) 09/04/2017  . GERD (gastroesophageal reflux disease) 09/04/2017  . Diabetes mellitus type 2, insulin dependent ([place]) 09/04/2017  . Anemia 09/04/2017  . HLD (hyperlipidemia) 09/04/2017  . CKD (chronic kidney  disease) stage 3, GFR 30-59 ml/min ([facility]) 09/04/2017  . Aphasia 09/01/2015  . TIA (transient ischemic attack) 06/17/2015  . Osteoporosis 02/18/2015  . Normocytic anemia 12/13/2013  . Diabetes mellitus type II, uncontrolled ([place]) 12/10/2013  . Pneumonia 12/10/2013  . Sepsis ([place]) 12/09/2013  . Type 2 diabetes mellitus with insulin deficiency ([place])   . Hypertension   . Hypertriglyceridemia   . Dyslipidemia     Past Surgical History:  Procedure Laterality Date  . ABDOMINAL HYSTERECTOMY  1988  . APPENDECTOMY  1966  . BREAST CYST EXCISION    . CATARACT EXTRACTION Bilateral 10/2009  . CHOLECYSTECTOMY  1989  . EYE SURGERY Bilateral    Cat Sx OU  . Several benign cyst removed     last 1 in 1972  . TUBAL LIGATION    . UMBILICAL HERNIA REPAIR       OB History   No obstetric history on file.     Family History  Problem Relation Age of Onset  . Stroke Mother 66  . Hypertension Mother   . Clotting disorder Father   . Heart attack Father   . Arrhythmia Sister   . Stroke Brother   . Thyroid disease Neg Hx     Social History   Tobacco Use  . Smoking status: Never Smoker  . Smokeless tobacco: Never Used  Substance Use Topics  . Alcohol use: No  . Drug use: No    Home Medications Prior to Admission medications   Medication Sig Start Date End Date Taking? Authorizing Provider  Blood Glucose Monitoring Suppl (ONE TOUCH ULTRA SYSTEM KIT) W/DEVICE KIT 1 kit by Does not apply route once. Tests Blood sugar before meals and at bedtime 07/10/14   [name], MD  Calcium Carbonate-Vitamin D (CALTRATE 600+D PO) Take by mouth 2 (two) times daily.    [provider]  clopidogrel (PLAVIX) 75 MG tablet Take 1 tablet (75 mg total) by mouth daily. 10/17/19   [name], MD  Coenzyme Q10 (COQ10 PO) Take by mouth.    [provider]  Continuous Blood Gluc Sensor (FREESTYLE LIBRE 14 DAY SENSOR) MISC USE EVERY 14 DAYS 07/02/20   [name], MD  denosumab  (PROLIA) 60 MG/ML SOSY injection Inject into the skin every 6 (six) months.  10/21/17   [provider]  donepezil (ARICEPT) 10 MG tablet Take 1 tablet (10 mg total) by mouth at bedtime. 03/31/20   [name], MD  fenofibrate 160 MG tablet Take 1 tablet (160 mg total) by mouth daily. 10/17/19   [name], MD  fluticasone (FLONASE) 50 MCG/ACT nasal spray Place 2 sprays into both nostrils daily. 08/12/20   [name], MD  hydrochlorothiazide (MICROZIDE) 12.5 MG capsule TAKE 1 CAPSULE(12.5 MG) BY MOUTH DAILY 11/22/19   [name], MD  icosapent Ethyl (VASCEPA) 1 g capsule Take 2 capsules (2 g total) by mouth 2 (two) times daily. 05/29/20   [name], MD  insulin lispro (HUMALOG KWIKPEN) 100 UNIT/ML KwikPen Inject into the skin. Use only if blood sugar is over 200 in the am.    [provider]  Insulin Pen Needle (B-D ULTRAFINE III SHORT PEN) 31G X 8 MM MISC INJECT AS DIRECTED 4 TIMES DAILY 08/21/19   [name], MD  INSULIN SYRINGE 1CC/29G 29G X 1/2" 1 ML MISC AS DIRECTED. 07/09/15   Elayne Snare, MD  losartan (COZAAR) 100 MG tablet Take 1 tablet (100 mg total) by mouth daily. 10/17/19   [name], MD  metoprolol succinate (TOPROL-XL) 50 MG 24 hr tablet TAKE 1 TABLET BY MOUTH EVERY DAY WITH OR IMMEDIATELY FOLLOWING A MEAL 07/08/20   [name], MD  Multiple Vitamin (MULTIVITAMIN) tablet Take 1 tablet by mouth daily.      [provider]  NONFORMULARY OR COMPOUNDED ITEM [facility]:  Peripheral Neuropathy Cream - Bupivacaine 1%, Doxepin 3%, Gabapentin 6%, Pentoxifylline 3%, Topiramate 1%, apply 1-2 grams to affected ares 3-4 times a day prn. 08/23/18   Marzetta Board, DPM  omeprazole (PRILOSEC) 20 MG capsule TAKE 1 CAPSULE BY MOUTH DAILY 09/10/19   [name], MD  ONE TOUCH ULTRA TEST test strip USE FOUR TIMES DAILY BEFORE MEALS AND EVERY NIGHT AT BEDTIME 10/06/16   [name], MD  pantoprazole (PROTONIX) 40  MG tablet Take 1 tablet (40 mg total) by mouth daily. 08/12/20   [name], MD  pioglitazone (ACTOS) 30 MG tablet TAKE 1 TABLET BY MOUTH EVERY DAY 07/21/20   [name], MD  polyvinyl alcohol (LIQUIFILM TEARS) 1.4 % ophthalmic solution 1 drop as needed for dry eyes.    [provider]  Pyridoxine HCl (VITAMIN B-6) 100 MG tablet Take 100 mg by mouth daily.      [provider]  rosuvastatin (CRESTOR) 20 MG  tablet TAKE 1 TABLET(20 MG) BY MOUTH DAILY 10/17/19   [name], MD  TOUJEO SOLOSTAR 300 UNIT/ML Solostar Pen INJECT 20 UNITS INTO THE SKIN IN THE MORNING. 08/22/20   [name], MD  TRULICITY 1.5 YN/8.2NF SOPN INJECT 1.$RemoveBefor'5MG'[id number]$  (1 PEN) UNDER THE SKIN ONCE A WEEK 07/07/20   Elayne Snare, MD    Allergies    Lipitor [atorvastatin calcium], Niacin and related, Statins, and Macrobid [nitrofurantoin macrocrystal]  Review of Systems   Review of Systems  All other systems reviewed and are negative.   Physical Exam Updated Vital Signs BP (!) 176/63   Pulse 65   Temp 97.9 F (36.6 C) (Oral)   Resp 18   Ht 5' 7.5" (1.715 m)   Wt 67.6 kg   SpO2 98%   BMI 22.99 kg/m   Physical Exam Vitals and nursing note reviewed.  Constitutional:      General: She is not in acute distress.    Appearance: She is well-developed and well-nourished. She is not diaphoretic.  HENT:     Head: Normocephalic and atraumatic.     Comments: There is a small ecchymotic, swollen area to the scalp just superior to the occiput.  There is no palpable defect. Eyes:     Extraocular Movements: Extraocular movements intact.     Pupils: Pupils are equal, round, and reactive to light.  Neck:     Comments: There is no cervical spine tenderness.  Patient has painless range of motion in all directions. Cardiovascular:     Rate and Rhythm: Normal rate and regular rhythm.     Heart sounds: No murmur heard. No friction rub. No gallop.   Pulmonary:     Effort: Pulmonary effort is  normal. No respiratory distress.     Breath sounds: Normal breath sounds. No wheezing.  Abdominal:     General: Bowel sounds are normal. There is no distension.     Palpations: Abdomen is soft.     Tenderness: There is no abdominal tenderness.  Musculoskeletal:        General: Normal range of motion.     Cervical back: Normal range of motion and neck supple.     Comments: There is no pain in the hips.  She has full range of motion with no discomfort.  Skin:    General: Skin is warm and dry.  Neurological:     Mental Status: She is alert and oriented to person, place, and time.     ED Results / Procedures / Treatments   Labs (all labs ordered are listed, but only abnormal results are displayed) Labs Reviewed  BASIC METABOLIC PANEL - Abnormal; Notable for the following components:      Result Value   Potassium 3.0 (*)    Glucose, Bld 144 (*)    Creatinine, Ser 1.26 (*)    GFR, Estimated 41 (*)    All other components within normal limits  CBC - Abnormal; Notable for the following components:   RBC 3.83 (*)    Hemoglobin 11.2 (*)    HCT 34.4 (*)    All other components within normal limits  URINALYSIS, ROUTINE W REFLEX MICROSCOPIC    EKG EKG Interpretation  Date/Time:  Wednesday September 17 2020 21:55:01 EST Ventricular Rate:  62 PR Interval:    QRS Duration: 76 QT Interval:  404 QTC Calculation: 410 R Axis:   0 Text Interpretation: Sinus rhythm Nonspecific T wave abnormality Confirmed by Lajean Saver [phone]) on 09/17/2020 10:24:53 PM  Radiology No results found.  Procedures Procedures   Medications Ordered in ED Medications - No data to display  ED Course  I have reviewed the triage vital signs and the nursing notes.  Pertinent labs & imaging results that were available during my care of the patient were reviewed by me and considered in my medical decision making (see chart for details).    MDM Rules/Calculators/A&P  CT scan and laboratory studies are  unremarkable.  At this point, I feel as though discharge is appropriate with as needed return.  Final Clinical Impression(s) / ED Diagnoses Final diagnoses:  None    Rx / DC Orders ED Discharge Orders    None       Veryl Speak, MD 09/18/20 [phone]

## 2020-09-18 ENCOUNTER — Other Ambulatory Visit: Payer: Self-pay | Admitting: Family Medicine

## 2020-09-18 DIAGNOSIS — R600 Localized edema: Secondary | ICD-10-CM | POA: Diagnosis not present

## 2020-09-18 DIAGNOSIS — G9389 Other specified disorders of brain: Secondary | ICD-10-CM | POA: Diagnosis not present

## 2020-09-18 DIAGNOSIS — R42 Dizziness and giddiness: Secondary | ICD-10-CM | POA: Diagnosis not present

## 2020-09-18 DIAGNOSIS — I6523 Occlusion and stenosis of bilateral carotid arteries: Secondary | ICD-10-CM | POA: Diagnosis not present

## 2020-09-18 NOTE — Discharge Instructions (Addendum)
Continue medications as previously prescribed.  Return to the emergency department if you develop severe headache, difficulty wakening, worsening balance, or other new and concerning symptoms.

## 2020-09-19 ENCOUNTER — Telehealth: Payer: Self-pay

## 2020-09-19 NOTE — Telephone Encounter (Signed)
Transition Care Management Follow-up Telephone Call  Date of discharge and from where: 09/18/2020 from [facility]  How have you been since you were released from the hospital? Pt son answered. He stated that patient is feeling well and has little pain at the trauma site.  Any questions or concerns? No  Items Reviewed:  Did the pt receive and understand the discharge instructions provided? Yes   Medications obtained and verified? Yes   Other? No   Any new allergies since your discharge? No   Dietary orders reviewed? N/A  Do you have support at home? Yes   Functional Questionnaire: (I = Independent and D = Dependent) ADLs: I  Bathing/Dressing- I  Meal Prep- D  Eating- I  Maintaining continence- I  Transferring/Ambulation- I  Managing Meds- I   Follow up appointments reviewed:   PCP Hospital f/u appt confirmed? No  Pt son will call today to schedule follow up with PCP.   Are transportation arrangements needed? No  If their condition worsens, is the pt aware to call PCP or go to the Emergency Dept.? Yes Was the patient provided with contact information for the PCP's office or ED? Yes Was to pt encouraged to call back with questions or concerns? Yes

## 2020-09-23 ENCOUNTER — Telehealth: Payer: Self-pay | Admitting: *Deleted

## 2020-09-23 NOTE — Telephone Encounter (Signed)
Received call from patient son, [name].   Reports that patient was seen in hospital S/P fall with hematoma. States that records are in chart and he only wanted to notify PCP.

## 2020-09-24 ENCOUNTER — Telehealth: Payer: Self-pay | Admitting: Family Medicine

## 2020-09-24 NOTE — Telephone Encounter (Signed)
error 

## 2020-09-24 NOTE — Telephone Encounter (Signed)
Please advise 

## 2020-09-24 NOTE — Telephone Encounter (Signed)
Randy call need a letter stating Rebecca Perkins is able to sign POA papers for the lawyer to handle her care

## 2020-09-25 NOTE — Progress Notes (Signed)
[facility] Note  09/29/2020     CHIEF COMPLAINT Patient presents for Retina Follow Up   HISTORY OF PRESENT ILLNESS: Rebecca Perkins is a 87 y.o. female who presents to the clinic today for:   HPI    Retina Follow Up    Patient presents with  Diabetic Retinopathy.  In right eye.  This started weeks ago.  Severity is moderate.  Duration of weeks.  Since onset it is stable.  I, the attending physician,  performed the HPI with the patient and updated documentation appropriately.          Comments    Pt states vision is the same OU.  Pt denies eye pain or discomfort.  Pt denies any new or worsening floaters or fol OU.       Last edited by Bernarda Caffey, MD on 09/29/2020  1:31 PM. (History)    pt states she thinks her vision is "fine", she only needs her glasses to read, pt states her blood sugar is under control, her son states they have more low blood sugars than they do high blood sugars  Referring physician: [name], MD [address] [place],  [place] [address]  HISTORICAL INFORMATION:   Selected notes from the MEDICAL RECORD NUMBER Referred by Dr. Luberta Mutter for concern of DME   CURRENT MEDICATIONS: Current Outpatient Medications (Ophthalmic Drugs)  Medication Sig  . polyvinyl alcohol (LIQUIFILM TEARS) 1.4 % ophthalmic solution 1 drop as needed for dry eyes.   No current facility-administered medications for this visit. (Ophthalmic Drugs)   Current Outpatient Medications (Other)  Medication Sig  . Blood Glucose Monitoring Suppl (ONE TOUCH ULTRA SYSTEM KIT) W/DEVICE KIT 1 kit by Does not apply route once. Tests Blood sugar before meals and at bedtime  . Calcium Carbonate-Vitamin D (CALTRATE 600+D PO) Take by mouth 2 (two) times daily.  . clopidogrel (PLAVIX) 75 MG tablet Take 1 tablet (75 mg total) by mouth daily.  . Coenzyme Q10 (COQ10 PO) Take by mouth.  . Continuous Blood Gluc Sensor (FREESTYLE LIBRE 14 DAY SENSOR)  MISC USE EVERY 14 DAYS  . denosumab (PROLIA) 60 MG/ML SOSY injection Inject into the skin every 6 (six) months.   . donepezil (ARICEPT) 10 MG tablet Take 1 tablet (10 mg total) by mouth at bedtime.  . fenofibrate 160 MG tablet TAKE 1 TABLET BY MOUTH EVERY DAY  . fluticasone (FLONASE) 50 MCG/ACT nasal spray Place 2 sprays into both nostrils daily.  . hydrochlorothiazide (MICROZIDE) 12.5 MG capsule TAKE 1 CAPSULE(12.5 MG) BY MOUTH DAILY  . icosapent Ethyl (VASCEPA) 1 g capsule Take 2 capsules (2 g total) by mouth 2 (two) times daily.  . insulin lispro (HUMALOG KWIKPEN) 100 UNIT/ML KwikPen Inject into the skin. Use only if blood sugar is over 200 in the am.  . Insulin Pen Needle (B-D ULTRAFINE III SHORT PEN) 31G X 8 MM MISC INJECT AS DIRECTED 4 TIMES DAILY  . INSULIN SYRINGE 1CC/29G 29G X 1/2" 1 ML MISC AS DIRECTED.  Marland Kitchen losartan (COZAAR) 100 MG tablet Take 1 tablet (100 mg total) by mouth daily.  . metoprolol succinate (TOPROL-XL) 50 MG 24 hr tablet TAKE 1 TABLET BY MOUTH EVERY DAY WITH OR IMMEDIATELY FOLLOWING A MEAL  . Multiple Vitamin (MULTIVITAMIN) tablet Take 1 tablet by mouth daily.    . NONFORMULARY OR COMPOUNDED ITEM [facility]:  Peripheral Neuropathy Cream - Bupivacaine 1%, Doxepin 3%, Gabapentin 6%, Pentoxifylline 3%, Topiramate  1%, apply 1-2 grams to affected ares 3-4 times a day prn.  . omeprazole (PRILOSEC) 20 MG capsule TAKE 1 CAPSULE BY MOUTH DAILY  . ONE TOUCH ULTRA TEST test strip USE FOUR TIMES DAILY BEFORE MEALS AND EVERY NIGHT AT BEDTIME  . pantoprazole (PROTONIX) 40 MG tablet Take 1 tablet (40 mg total) by mouth daily.  . pioglitazone (ACTOS) 30 MG tablet TAKE 1 TABLET BY MOUTH EVERY DAY  . Pyridoxine HCl (VITAMIN B-6) 100 MG tablet Take 100 mg by mouth daily.    . rosuvastatin (CRESTOR) 20 MG tablet TAKE 1 TABLET(20 MG) BY MOUTH DAILY  . TOUJEO SOLOSTAR 300 UNIT/ML Solostar Pen INJECT 20 UNITS INTO THE SKIN IN THE MORNING.  . TRULICITY 1.5 CB/4.4HQ SOPN INJECT 1.5MG (1  PEN) UNDER THE SKIN ONCE A WEEK   Current Facility-Administered Medications (Other)  Medication Route  . denosumab (PROLIA) injection 60 mg Subcutaneous      REVIEW OF SYSTEMS: ROS    Positive for: Gastrointestinal, Neurological, Genitourinary, Musculoskeletal, HENT, Endocrine, Eyes   Negative for: Constitutional, Skin, Cardiovascular, Respiratory, Psychiatric, Allergic/Imm, Heme/Lymph   Last edited by Doneen Poisson on 09/29/2020  1:07 PM. (History)       ALLERGIES Allergies  Allergen Reactions  . Lipitor [Atorvastatin Calcium] Diarrhea  . Niacin And Related Hives  . Statins Diarrhea  . Macrobid [Nitrofurantoin Macrocrystal] Other (See Comments)    unknown    PAST MEDICAL HISTORY Past Medical History:  Diagnosis Date  . Anemia   . Diabetes mellitus type 2, insulin dependent ([place])   . Diabetic retinopathy ([place])    NPDR OU  . Dyslipidemia   . GERD (gastroesophageal reflux disease)   . Hypertension   . Hypertensive retinopathy    OU  . Migraines   . Osteoporosis   . PVD (peripheral vascular disease) ([place])    abi .83 (L), .92 (R)  . RSD (reflex sympathetic dystrophy) 2007   R wrist/hand following fx  . Stroke ([place])   . TIA (transient ischemic attack)   . Varicose veins    Past Surgical History:  Procedure Laterality Date  . ABDOMINAL HYSTERECTOMY  1988  . APPENDECTOMY  1966  . BREAST CYST EXCISION    . CATARACT EXTRACTION Bilateral 10/2009  . CHOLECYSTECTOMY  1989  . EYE SURGERY Bilateral    Cat Sx OU  . Several benign cyst removed     last 1 in 1972  . TUBAL LIGATION    . UMBILICAL HERNIA REPAIR      FAMILY HISTORY Family History  Problem Relation Age of Onset  . Stroke Mother 49  . Hypertension Mother   . Clotting disorder Father   . Heart attack Father   . Arrhythmia Sister   . Stroke Brother   . Thyroid disease Neg Hx     SOCIAL HISTORY Social History   Tobacco Use  . Smoking status: Never Smoker  . Smokeless tobacco: Never Used   Substance Use Topics  . Alcohol use: No  . Drug use: No         OPHTHALMIC EXAM:  Base Eye Exam    Visual Acuity (Snellen - Linear)      Right Left   Dist [place] 20/25 -2 20/30 -2   Dist ph [place] 20/25 20/25 -2       Tonometry (Tonopen, 1:13 PM)      Right Left   Pressure 16 14       Pupils      Dark Light  Shape React APD   Right 2 1 Round Minimal 0   Left 2 1 Round Minimal 0       Visual Fields      Left Right    Full Full       Extraocular Movement      Right Left    Full Full       Neuro/Psych    Oriented x3: Yes   Mood/Affect: Normal       Dilation    Both eyes: 1.0% Mydriacyl, 2.5% Phenylephrine @ 1:13 PM        Slit Lamp and Fundus Exam    Slit Lamp Exam      Right Left   Lids/Lashes Meibomian gland dysfunction, Dermatochalasis - upper lid Meibomian gland dysfunction, Dermatochalasis - upper lid   Conjunctiva/Sclera White and quiet White and quiet   Cornea 3-4+ Punctate epithelial erosions, Arcus, decreased TBUT, irregular epi trace Punctate epithelial erosions, Arcus, tear film debris   Anterior Chamber Deep and quiet Deep and quiet   Iris Round and dilated, No NVI Round and dilated, No NVI   Lens PCIOL in good position with open PC PCIOL in good position with open PC   Vitreous mild Vitreous syneresis mild Vitreous syneresis       Fundus Exam      Right Left   Disc Pink and Sharp trace Pallor, Sharp rim   C/D Ratio 0.4 0.5   Macula Flat, Blunted foveal reflex, scattered Microaneurysms/IRH, cluster of IRH and exudate just inside ST arcades -- improved, light focal laser scars ST macula Flat, blunted foveal reflex, scattered MA, no edema   Vessels Vascular attenuation, mild Tortuousity, no NVE Vascular attenuation, mild Tortuousity, no NVE   Periphery Attached, scattered MA/IRH greatest posteriorly, flat, pigmented lesion superior to disc Attached, scattered DBH greatest posteriorly          IMAGING AND PROCEDURES  Imaging and Procedures for  _0 @  OCT, Retina - OU - Both Eyes       Right Eye Quality was good. Central Foveal Thickness: 286. Progression has worsened. Findings include normal foveal contour, no SRF, intraretinal hyper-reflective material, intraretinal fluid (Mild interval increase in focal edema temporal macula).   Left Eye Quality was good. Central Foveal Thickness: 272. Progression has been stable. Findings include normal foveal contour, no IRF, no SRF.   Notes *Images captured and stored on drive  Diagnosis / Impression:  OD: Mild interval increase in focal edema temporal macula, non-central OS: no DME  Clinical management:  See below  Abbreviations: NFP - Normal foveal profile. CME - cystoid macular edema. PED - pigment epithelial detachment. IRF - intraretinal fluid. SRF - subretinal fluid. EZ - ellipsoid zone. ERM - epiretinal membrane. ORA - outer retinal atrophy. ORT - outer retinal tubulation. SRHM - subretinal hyper-reflective material                 ASSESSMENT/PLAN:    ICD-10-CM   1. Moderate nonproliferative diabetic retinopathy of right eye with macular edema associated with type 2 diabetes mellitus ([place])  X45.0388   2. Moderate nonproliferative diabetic retinopathy of left eye without macular edema associated with type 2 diabetes mellitus ([place])  E28.0034   3. Retinal edema  H35.81 OCT, Retina - OU - Both Eyes  4. Essential hypertension  I10   5. Hypertensive retinopathy of both eyes  H35.033   6. Pseudophakia of both eyes  Z96.1     1-3. Moderate nonproliferative diabetic retinopathy w/ DME, OD  -  OS without DME  - A1c was 6.8 on 08.30.21  - FA (07.14.20 and 7.19.21) shows late leaking MA OU; OD with focal hyperfluorescent leakage in area of DME  - s/p focal laser OD (11.09.20)  - OCT mild interval increase in focal edema temporal macula, non-central OD; OS no DME  - BCVA  20/25 OU  - exam shows scattered MA/IRH OU but no obvious targets for focal laser OD  - discussed  treatments for DME -- will hold off on intervention today  - f/u 4 months, DFE, OCT  4,5. Hypertensive retinopathy OU  - discussed importance of tight BP control  - monitor  6. Pseudophakia OU  - s/p CE/IOL OU (Dr. Albina Billet)  - beautiful surgeries, doing well  - monitor  7. Dry Eyes OU (OD > OS)  - recommend artificial tears and lubricating ointment as needed    Ophthalmic Meds Ordered this visit:  No orders of the defined types were placed in this encounter.      Return in about 4 months (around 01/27/2021) for f/u NPDR OD, DFE, OCT.  There are no Patient Instructions on file for this visit.  This document serves as a record of services personally performed by Gardiner Sleeper, MD, PhD. It was created on their behalf by Leeann Must, [place], an ophthalmic technician. The creation of this record is the provider's dictation and/or activities during the visit.    Electronically signed by: Leeann Must, [place] 02.24.2022 1:55 PM   This document serves as a record of services personally performed by Gardiner Sleeper, MD, PhD. It was created on their behalf by San Jetty. Owens Shark, OA an ophthalmic technician. The creation of this record is the provider's dictation and/or activities during the visit.    Electronically signed by: San Jetty. Owens Shark, [place] 02.28.2022 1:55 PM  Gardiner Sleeper, M.D., Ph.D. Diseases & Surgery of the Retina and [place] 09/29/2020   I have reviewed the above documentation for accuracy and completeness, and I agree with the above. Gardiner Sleeper, M.D., Ph.D. 09/29/20 1:55 PM   Abbreviations: M myopia (nearsighted); A astigmatism; H hyperopia (farsighted); P presbyopia; Mrx spectacle prescription;  CTL contact lenses; OD right eye; OS left eye; OU both eyes  XT exotropia; ET esotropia; PEK punctate epithelial keratitis; PEE punctate epithelial erosions; DES dry eye syndrome; MGD meibomian gland dysfunction; ATs artificial tears;  PFAT's preservative free artificial tears; [place] nuclear sclerotic cataract; PSC posterior subcapsular cataract; ERM epi-retinal membrane; PVD posterior vitreous detachment; RD retinal detachment; DM diabetes mellitus; DR diabetic retinopathy; NPDR non-proliferative diabetic retinopathy; PDR proliferative diabetic retinopathy; CSME clinically significant macular edema; DME diabetic macular edema; dbh dot blot hemorrhages; CWS cotton wool spot; POAG primary open angle glaucoma; C/D cup-to-disc ratio; HVF humphrey visual field; GVF goldmann visual field; OCT optical coherence tomography; IOP intraocular pressure; BRVO Branch retinal vein occlusion; CRVO central retinal vein occlusion; CRAO central retinal artery occlusion; BRAO branch retinal artery occlusion; RT retinal tear; SB scleral buckle; PPV pars plana vitrectomy; VH Vitreous hemorrhage; PRP panretinal laser photocoagulation; IVK intravitreal kenalog; VMT vitreomacular traction; MH Macular hole;  NVD neovascularization of the disc; NVE neovascularization elsewhere; AREDS age related eye disease study; ARMD age related macular degeneration; POAG primary open angle glaucoma; EBMD epithelial/anterior basement membrane dystrophy; ACIOL anterior chamber intraocular lens; IOL intraocular lens; PCIOL posterior chamber intraocular lens; Phaco/IOL phacoemulsification with intraocular lens placement; PRK photorefractive keratectomy; LASIK laser assisted in situ keratomileusis; HTN hypertension; DM diabetes  mellitus; COPD chronic obstructive pulmonary disease

## 2020-09-25 NOTE — Telephone Encounter (Signed)
I will be glad to write a letter but can we get specifically what is needed t be said in the letter.

## 2020-09-25 NOTE — Telephone Encounter (Signed)
Call placed to patient son, Rebecca Perkins to clarify.   Reports that lawyer is requesting competency letter to sign POA forms. States that current forms list deceased spouse as POA.   A generic letter from a doctor attesting to a patient's mental capacity should be printed on the 53 letterhead and include the following fundamental piece of information: - Patient's name and date of birth - Date the patient-physician relationships established - 74 statement testifying to the patient's ability or inability to make independent decisions regarding healthcare, finances, and legal matters  - Physician's contact information

## 2020-09-26 ENCOUNTER — Encounter: Payer: Self-pay | Admitting: Family Medicine

## 2020-09-26 NOTE — Telephone Encounter (Signed)
Note is on my desk.

## 2020-09-26 NOTE — Telephone Encounter (Signed)
Patient son Rebecca Perkins made aware.

## 2020-09-29 ENCOUNTER — Ambulatory Visit (INDEPENDENT_AMBULATORY_CARE_PROVIDER_SITE_OTHER): Payer: Medicare Other | Admitting: Ophthalmology

## 2020-09-29 ENCOUNTER — Encounter (INDEPENDENT_AMBULATORY_CARE_PROVIDER_SITE_OTHER): Payer: Self-pay | Admitting: Ophthalmology

## 2020-09-29 ENCOUNTER — Other Ambulatory Visit: Payer: Self-pay

## 2020-09-29 DIAGNOSIS — E113392 Type 2 diabetes mellitus with moderate nonproliferative diabetic retinopathy without macular edema, left eye: Secondary | ICD-10-CM | POA: Diagnosis not present

## 2020-09-29 DIAGNOSIS — H35033 Hypertensive retinopathy, bilateral: Secondary | ICD-10-CM | POA: Diagnosis not present

## 2020-09-29 DIAGNOSIS — E113311 Type 2 diabetes mellitus with moderate nonproliferative diabetic retinopathy with macular edema, right eye: Secondary | ICD-10-CM

## 2020-09-29 DIAGNOSIS — H3581 Retinal edema: Secondary | ICD-10-CM

## 2020-09-29 DIAGNOSIS — Z961 Presence of intraocular lens: Secondary | ICD-10-CM | POA: Diagnosis not present

## 2020-09-29 DIAGNOSIS — I10 Essential (primary) hypertension: Secondary | ICD-10-CM | POA: Diagnosis not present

## 2020-09-30 ENCOUNTER — Other Ambulatory Visit: Payer: Self-pay

## 2020-09-30 ENCOUNTER — Encounter: Payer: Self-pay | Admitting: Family Medicine

## 2020-09-30 ENCOUNTER — Ambulatory Visit: Payer: Medicare Other | Admitting: Family Medicine

## 2020-10-01 ENCOUNTER — Encounter: Payer: Self-pay | Admitting: *Deleted

## 2020-10-08 ENCOUNTER — Encounter: Payer: Self-pay | Admitting: Family Medicine

## 2020-10-08 DIAGNOSIS — E113293 Type 2 diabetes mellitus with mild nonproliferative diabetic retinopathy without macular edema, bilateral: Secondary | ICD-10-CM | POA: Diagnosis not present

## 2020-10-08 DIAGNOSIS — Z961 Presence of intraocular lens: Secondary | ICD-10-CM | POA: Diagnosis not present

## 2020-10-08 DIAGNOSIS — H52203 Unspecified astigmatism, bilateral: Secondary | ICD-10-CM | POA: Diagnosis not present

## 2020-10-08 LAB — HM DIABETES EYE EXAM

## 2020-11-01 ENCOUNTER — Other Ambulatory Visit: Payer: Self-pay | Admitting: Family Medicine

## 2020-11-01 DIAGNOSIS — R059 Cough, unspecified: Secondary | ICD-10-CM

## 2020-11-01 DIAGNOSIS — M7989 Other specified soft tissue disorders: Secondary | ICD-10-CM

## 2020-11-04 ENCOUNTER — Ambulatory Visit (INDEPENDENT_AMBULATORY_CARE_PROVIDER_SITE_OTHER): Payer: Medicare Other | Admitting: *Deleted

## 2020-11-04 ENCOUNTER — Other Ambulatory Visit: Payer: Self-pay

## 2020-11-04 DIAGNOSIS — M810 Age-related osteoporosis without current pathological fracture: Secondary | ICD-10-CM | POA: Diagnosis not present

## 2020-11-06 ENCOUNTER — Other Ambulatory Visit: Payer: Self-pay | Admitting: Endocrinology

## 2020-11-11 ENCOUNTER — Encounter: Payer: Self-pay | Admitting: Family Medicine

## 2020-11-11 ENCOUNTER — Other Ambulatory Visit: Payer: Self-pay

## 2020-11-11 ENCOUNTER — Ambulatory Visit (INDEPENDENT_AMBULATORY_CARE_PROVIDER_SITE_OTHER): Payer: Medicare Other | Admitting: Family Medicine

## 2020-11-11 VITALS — BP 122/68 | HR 80 | Temp 98.2°F | Resp 16 | Ht 67.5 in | Wt 138.0 lb

## 2020-11-11 DIAGNOSIS — E119 Type 2 diabetes mellitus without complications: Secondary | ICD-10-CM | POA: Diagnosis not present

## 2020-11-11 DIAGNOSIS — Z794 Long term (current) use of insulin: Secondary | ICD-10-CM

## 2020-11-11 DIAGNOSIS — R634 Abnormal weight loss: Secondary | ICD-10-CM | POA: Diagnosis not present

## 2020-11-11 DIAGNOSIS — N183 Chronic kidney disease, stage 3 unspecified: Secondary | ICD-10-CM | POA: Diagnosis not present

## 2020-11-11 NOTE — Progress Notes (Signed)
Subjective:    Patient ID: Rebecca Perkins, female    DOB: [birth year], 87 y.o.   MRN: [id number] Wt Readings from Last 3 Encounters:  11/11/20 138 lb (62.6 kg)  09/17/20 149 lb (67.6 kg)  03/31/20 145 lb (65.8 kg)   Patient is a very sweet 87 year old female here today with her son Octavia Bruckner.  Since I last saw her, she has lost approximately 10 pounds according to our scales.  Her son states that her appetite has not been quite as good as it once was.  However that she still eats well.  They prepare her meals every day and she eats them.  She states that she feels healthy.  She denies any pains.  She denies any fevers or chills or night sweats.  She denies any chest pain or shortness of breath.  She denies any cough or pleurisy or hemoptysis.  She denies any abdominal pain nausea or vomiting or melena or hematochezia.  Her son has noticed a consistent trend.  Every morning around 7 AM, her sugars were krater.  They typically are hitting between 60 and 50 every morning around 7:00.  This is been consistent now for almost 2 weeks.  She takes 20 units of Toujeo in the morning with food.  She is not taking a nighttime snack.  Past Medical History:  Diagnosis Date  . Anemia   . Diabetes mellitus type 2, insulin dependent ([place])   . Diabetic retinopathy ([place])    NPDR OU  . Dyslipidemia   . GERD (gastroesophageal reflux disease)   . Hypertension   . Hypertensive retinopathy    OU  . Migraines   . Osteoporosis   . PVD (peripheral vascular disease) ([place])    abi .83 (L), .92 (R)  . RSD (reflex sympathetic dystrophy) 2007   R wrist/hand following fx  . Stroke ([place])   . TIA (transient ischemic attack)   . Varicose veins    Past Surgical History:  Procedure Laterality Date  . ABDOMINAL HYSTERECTOMY  1988  . APPENDECTOMY  1966  . BREAST CYST EXCISION    . CATARACT EXTRACTION Bilateral 10/2009  . CHOLECYSTECTOMY  1989  . EYE SURGERY Bilateral    Cat Sx OU  . Several benign cyst removed     last  1 in 1972  . TUBAL LIGATION    . UMBILICAL HERNIA REPAIR     Current Outpatient Medications on File Prior to Visit  Medication Sig Dispense Refill  . Blood Glucose Monitoring Suppl (ONE TOUCH ULTRA SYSTEM KIT) W/DEVICE KIT 1 kit by Does not apply route once. Tests Blood sugar before meals and at bedtime 1 each 0  . Calcium Carbonate-Vitamin D (CALTRATE 600+D PO) Take by mouth 2 (two) times daily.    . clopidogrel (PLAVIX) 75 MG tablet Take 1 tablet (75 mg total) by mouth daily. 90 tablet 3  . Coenzyme Q10 (COQ10 PO) Take by mouth.    . Continuous Blood Gluc Sensor (FREESTYLE LIBRE 14 DAY SENSOR) MISC USE EVERY 14 DAYS 6 each 3  . denosumab (PROLIA) 60 MG/ML SOSY injection Inject into the skin every 6 (six) months.     . donepezil (ARICEPT) 10 MG tablet Take 1 tablet (10 mg total) by mouth at bedtime. 90 tablet 3  . fenofibrate 160 MG tablet TAKE 1 TABLET BY MOUTH EVERY DAY 90 tablet 3  . fluticasone (FLONASE) 50 MCG/ACT nasal spray Place 2 sprays into both nostrils daily. 16 g 6  . hydrochlorothiazide (  MICROZIDE) 12.5 MG capsule TAKE 1 CAPSULE(12.5 MG) BY MOUTH DAILY 90 capsule 3  . icosapent Ethyl (VASCEPA) 1 g capsule Take 2 capsules (2 g total) by mouth 2 (two) times daily. 120 capsule 11  . insulin lispro (HUMALOG KWIKPEN) 100 UNIT/ML KwikPen Inject into the skin. Use only if blood sugar is over 200 in the am.    . Insulin Pen Needle (B-D ULTRAFINE III SHORT PEN) 31G X 8 MM MISC INJECT AS DIRECTED 4 TIMES DAILY 300 each 2  . INSULIN SYRINGE 1CC/29G 29G X 1/2" 1 ML MISC AS DIRECTED. 100 each 3  . losartan (COZAAR) 100 MG tablet Take 1 tablet (100 mg total) by mouth daily. 90 tablet 3  . metoprolol succinate (TOPROL-XL) 50 MG 24 hr tablet TAKE 1 TABLET BY MOUTH EVERY DAY WITH OR IMMEDIATELY FOLLOWING A MEAL 90 tablet 1  . Multiple Vitamin (MULTIVITAMIN) tablet Take 1 tablet by mouth daily.      . NONFORMULARY OR COMPOUNDED ITEM [facility]:  Peripheral Neuropathy Cream - Bupivacaine  1%, Doxepin 3%, Gabapentin 6%, Pentoxifylline 3%, Topiramate 1%, apply 1-2 grams to affected ares 3-4 times a day prn. 100 each 5  . omeprazole (PRILOSEC) 20 MG capsule TAKE 1 CAPSULE BY MOUTH DAILY 90 capsule 3  . ONE TOUCH ULTRA TEST test strip USE FOUR TIMES DAILY BEFORE MEALS AND EVERY NIGHT AT BEDTIME 150 each 5  . pantoprazole (PROTONIX) 40 MG tablet TAKE 1 TABLET BY MOUTH EVERY DAY 90 tablet 1  . pioglitazone (ACTOS) 30 MG tablet TAKE 1 TABLET BY MOUTH EVERY DAY 90 tablet 3  . polyvinyl alcohol (LIQUIFILM TEARS) 1.4 % ophthalmic solution 1 drop as needed for dry eyes.    . Pyridoxine HCl (VITAMIN B-6) 100 MG tablet Take 100 mg by mouth daily.      . rosuvastatin (CRESTOR) 20 MG tablet TAKE 1 TABLET(20 MG) BY MOUTH DAILY 90 tablet 3  . TOUJEO SOLOSTAR 300 UNIT/ML Solostar Pen INJECT 20 UNITS INTO THE SKIN IN THE MORNING. 4.5 mL 3  . TRULICITY 1.5 VH/8.4ON SOPN INJECT 1.5MG (1 PEN) UNDER THE SKIN ONCE A WEEK 2 mL 3   Current Facility-Administered Medications on File Prior to Visit  Medication Dose Route Frequency Provider Last Rate Last Admin  . denosumab (PROLIA) injection 60 mg  60 mg Subcutaneous Q6 months [name], MD   60 mg at 11/04/20 1409   Allergies  Allergen Reactions  . Lipitor [Atorvastatin Calcium] Diarrhea  . Niacin And Related Hives  . Statins Diarrhea  . Macrobid [Nitrofurantoin Macrocrystal] Other (See Comments)    unknown   Social History   Socioeconomic History  . Marital status: Widowed    Spouse name: Not on file  . Number of children: 3  . Years of education: Not on file  . Highest education level: Not on file  Occupational History  . Occupation: Retired  Tobacco Use  . Smoking status: Never Smoker  . Smokeless tobacco: Never Used  Substance and Sexual Activity  . Alcohol use: No  . Drug use: No  . Sexual activity: Not on file  Other Topics Concern  . 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      Objective:   Physical Exam Vitals reviewed.  Constitutional:      General: She is not in acute distress.    Appearance: Normal appearance. She is well-developed. She is obese. She is not ill-appearing, toxic-appearing or diaphoretic.  HENT:     Head: Normocephalic and atraumatic.     Right Ear: Tympanic membrane and ear canal normal. There is no impacted cerumen.     Left Ear: Tympanic membrane and ear canal normal. There is no impacted cerumen.     Nose: Nose normal. No congestion or rhinorrhea.     Mouth/Throat:     Mouth: Mucous membranes are moist.     Pharynx: Oropharynx is clear. No oropharyngeal exudate or posterior oropharyngeal erythema.  Eyes:     General: No scleral icterus.       Right eye: No discharge.        Left eye: No discharge.     Conjunctiva/sclera: Conjunctivae normal.     Pupils: Pupils are equal, round, and reactive to light.  Neck:     Vascular: No carotid bruit or JVD.  Cardiovascular:     Rate and Rhythm: Normal rate and regular rhythm.     Heart sounds: Normal heart sounds. No murmur heard. No friction rub. No gallop.   Pulmonary:     Effort: Pulmonary effort is normal. No respiratory distress.     Breath sounds: Normal breath sounds. No stridor. No wheezing, rhonchi or rales.  Chest:     Chest wall: No tenderness.  Abdominal:     General: Bowel sounds are normal. There is no distension.     Palpations: Abdomen is soft. There is no mass.     Tenderness: There is no abdominal tenderness. There is no right CVA tenderness,  left CVA tenderness, guarding or rebound.     Hernia: No hernia is present.  Musculoskeletal:     Cervical back: Neck supple. No rigidity or tenderness.     Right lower leg: No edema.     Left lower leg: No edema.  Lymphadenopathy:     Cervical: No cervical adenopathy.  Skin:    Findings: No erythema.  Neurological:     General: No focal deficit present.     Mental Status: She is alert and oriented to person, place, and time.     Cranial Nerves: No cranial nerve deficit.     Sensory: No sensory deficit.     Motor: No weakness.     Coordination: Coordination normal.     Gait: Gait normal.  Psychiatric:        Mood and Affect: Mood normal.        Behavior: Behavior normal.        Thought Content: Thought content normal.        Judgment: Judgment normal.           Assessment & Plan:  Diabetes mellitus type 2, insulin dependent ([place]) - Plan: CBC with Differential/Platelet, COMPLETE METABOLIC PANEL WITH GFR, Hemoglobin A1c, Microalbumin, urine  Stage 3 chronic kidney disease, unspecified whether stage 3a or 3b CKD (HCC)  Weight loss - Plan: TSH  Aside from the weight loss, the patient looks well.  She is healthy and well-nourished.  She is in good spirits.  She  denies any symptoms that would give a clue as to why the weight loss may have occurred.  She is on Trulicity which can certainly affect her appetite.  I am most concerned by her hypoglycemia in the morning.  Therefore of asked her sons to reduce her Toujeo to 10 units in the morning.  If she continues to experience low sugars in the morning we will discontinue Toujeo altogether.  I have also suggested that she eat a snack before bedtime at night to reduce hypoglycemia in the mornings.  Regarding her labs I will check a CBC, CMP, A1c, urine microalbumin and given the weight loss I will check a TSH.  Patient is not fasting so I cannot check her cholesterol.  Blood pressures are outstanding.

## 2020-11-12 LAB — COMPLETE METABOLIC PANEL WITH GFR
AG Ratio: 1.2 (calc) (ref 1.0–2.5)
ALT: 19 U/L (ref 6–29)
AST: 37 U/L — ABNORMAL HIGH (ref 10–35)
Albumin: 3.4 g/dL — ABNORMAL LOW (ref 3.6–5.1)
Alkaline phosphatase (APISO): 36 U/L — ABNORMAL LOW (ref 37–153)
BUN/Creatinine Ratio: 15 (calc) (ref 6–22)
BUN: 19 mg/dL (ref 7–25)
CO2: 31 mmol/L (ref 20–32)
Calcium: 9.7 mg/dL (ref 8.6–10.4)
Chloride: 105 mmol/L (ref 98–110)
Creat: 1.27 mg/dL — ABNORMAL HIGH (ref 0.60–0.88)
GFR, Est African American: 43 mL/min/{1.73_m2} — ABNORMAL LOW (ref >=60)
GFR, Est Non African American: 37 mL/min/{1.73_m2} — ABNORMAL LOW (ref >=60)
Globulin: 2.9 g/dL (calc) (ref 1.9–3.7)
Glucose, Bld: 96 mg/dL (ref 65–99)
Potassium: 3.2 mmol/L — ABNORMAL LOW (ref 3.5–5.3)
Sodium: 144 mmol/L (ref 135–146)
Total Bilirubin: 0.6 mg/dL (ref 0.2–1.2)
Total Protein: 6.3 g/dL (ref 6.1–8.1)

## 2020-11-12 LAB — CBC WITH DIFFERENTIAL/PLATELET
Absolute Monocytes: 644 cells/uL (ref 200–950)
Basophils Absolute: 59 cells/uL (ref 0–200)
Basophils Relative: 0.9 %
Eosinophils Absolute: 202 cells/uL (ref 15–500)
Eosinophils Relative: 3.1 %
HCT: 34.6 % — ABNORMAL LOW (ref 35.0–45.0)
Hemoglobin: 10.9 g/dL — ABNORMAL LOW (ref 11.7–15.5)
Lymphs Abs: 2639 cells/uL (ref 850–3900)
MCH: 27.7 pg (ref 27.0–33.0)
MCHC: 31.5 g/dL — ABNORMAL LOW (ref 32.0–36.0)
MCV: 87.8 fL (ref 80.0–100.0)
MPV: 10.3 fL (ref 7.5–12.5)
Monocytes Relative: 9.9 %
Neutro Abs: 2958 cells/uL (ref 1500–7800)
Neutrophils Relative %: 45.5 %
Platelets: 210 10*3/uL (ref 140–400)
RBC: 3.94 10*6/uL (ref 3.80–5.10)
RDW: 12 % (ref 11.0–15.0)
Total Lymphocyte: 40.6 %
WBC: 6.5 10*3/uL (ref 3.8–10.8)

## 2020-11-12 LAB — HEMOGLOBIN A1C
Hgb A1c MFr Bld: 6.1 % of total Hgb — ABNORMAL HIGH (ref <5.7)
Mean Plasma Glucose: 128 mg/dL
eAG (mmol/L): 7.1 mmol/L

## 2020-11-12 LAB — TSH: TSH: 1.45 mIU/L (ref 0.40–4.50)

## 2020-11-12 LAB — MICROALBUMIN, URINE: Microalb, Ur: 3.5 mg/dL

## 2020-11-13 ENCOUNTER — Other Ambulatory Visit: Payer: Self-pay | Admitting: *Deleted

## 2020-11-13 MED ORDER — POTASSIUM CHLORIDE CRYS ER 20 MEQ PO TBCR: 20.0000 meq | EXTENDED_RELEASE_TABLET | Freq: Every day | ORAL | 3 refills | Status: DC

## 2020-11-18 ENCOUNTER — Other Ambulatory Visit: Payer: Self-pay | Admitting: Family Medicine

## 2020-11-18 ENCOUNTER — Other Ambulatory Visit: Payer: Self-pay | Admitting: Endocrinology

## 2020-11-18 NOTE — Telephone Encounter (Signed)
Pharmacy faxed med refill:  rosuvastatin (CRESTOR) 20 MG tablet   clopidogrel (PLAVIX) 75 MG tablet   Pharmacy: [id number] [place], [place] - [address]  [address] [place] [address]  Phone:  [phone] Fax:  [phone]

## 2020-11-19 ENCOUNTER — Other Ambulatory Visit: Payer: Self-pay | Admitting: Endocrinology

## 2020-11-19 ENCOUNTER — Other Ambulatory Visit: Payer: Self-pay | Admitting: *Deleted

## 2020-11-19 MED ORDER — TRULICITY 1.5 MG/0.5ML ~~LOC~~ SOAJ: SUBCUTANEOUS | 3 refills | Status: DC

## 2020-12-08 ENCOUNTER — Other Ambulatory Visit: Payer: Self-pay

## 2020-12-08 ENCOUNTER — Ambulatory Visit (INDEPENDENT_AMBULATORY_CARE_PROVIDER_SITE_OTHER): Payer: BC Managed Care – PPO | Admitting: Podiatry

## 2020-12-08 ENCOUNTER — Encounter: Payer: Self-pay | Admitting: Podiatry

## 2020-12-08 DIAGNOSIS — M2042 Other hammer toe(s) (acquired), left foot: Secondary | ICD-10-CM

## 2020-12-08 DIAGNOSIS — B351 Tinea unguium: Secondary | ICD-10-CM | POA: Diagnosis not present

## 2020-12-08 DIAGNOSIS — E1142 Type 2 diabetes mellitus with diabetic polyneuropathy: Secondary | ICD-10-CM | POA: Diagnosis not present

## 2020-12-08 DIAGNOSIS — L84 Corns and callosities: Secondary | ICD-10-CM | POA: Diagnosis not present

## 2020-12-08 DIAGNOSIS — M79675 Pain in left toe(s): Secondary | ICD-10-CM

## 2020-12-08 DIAGNOSIS — M79674 Pain in right toe(s): Secondary | ICD-10-CM

## 2020-12-08 DIAGNOSIS — M2041 Other hammer toe(s) (acquired), right foot: Secondary | ICD-10-CM

## 2020-12-09 ENCOUNTER — Telehealth: Payer: Self-pay | Admitting: Family Medicine

## 2020-12-09 NOTE — Telephone Encounter (Signed)
Patient's son [name] called to dispute a bill the patient recently received; he believes the bill was issued because the patient's insurance companies weren't billed. [name] will come into the office with patient's most recent insurance cards within the next couple of days. Please advise him at [phone].

## 2020-12-14 NOTE — Progress Notes (Signed)
  Subjective:  Patient ID: Rebecca Perkins, female    DOB: October 18, 1928,  MRN: [id number]  Rebecca Perkins presents to clinic today for at risk foot care with history of diabetic neuropathy and corn(s) right 2nd toe and painful thick toenails that are difficult to trim. Painful toenails interfere with ambulation. Aggravating factors include wearing enclosed shoe gear. Pain is relieved with periodic professional debridement. Painful corns are aggravated when weightbearing when wearing enclosed shoe gear. Pain is relieved with periodic professional debridement..  Allergies  Allergen Reactions  . Lipitor [Atorvastatin Calcium] Diarrhea  . Niacin And Related Hives  . Statins Diarrhea  . Macrobid [Nitrofurantoin Macrocrystal] Other (See Comments)    unknown    Review of Systems: Negative except as noted in the HPI. Objective:   Constitutional Rebecca Perkins is a pleasant 87 y.o. Caucasian female, in NAD. AAO x 3.   Vascular Neurovascular status unchanged b/l lower extremities. Capillary fill time to digits <3 seconds b/l lower extremities. Palpable pedal pulses b/l LE. Pedal hair absent. Lower extremity skin temperature gradient within normal limits. No pain with calf compression b/l. No cyanosis or clubbing noted.  Neurologic Normal speech. Oriented to person, place, and time. Protective sensation diminished with 10g monofilament b/l. Vibratory sensation intact b/l.  Dermatologic Pedal skin with normal turgor, texture and tone bilaterally. No open wounds bilaterally. No interdigital macerations bilaterally. Toenails 1-5 left, R hallux, R 3rd toe, R 4th toe and R 5th toe elongated, discolored, dystrophic, thickened, and crumbly with subungual debris and tenderness to dorsal palpation. Hyperkeratotic lesion(s) R 2nd toe.  No erythema, no edema, no drainage, no fluctuance.  Orthopedic: Normal muscle strength 5/5 to all lower extremity muscle groups bilaterally. No pain crepitus or joint limitation  noted with ROM b/l. No gross bony deformities bilaterally.   Radiographs: None Assessment:   1. Pain due to onychomycosis of toenails of both feet   2. Corns   3. Acquired hammertoes of both feet   4. Diabetic peripheral neuropathy associated with type 2 diabetes mellitus ([place])    Plan:  Patient was evaluated and treated and all questions answered.  Onychomycosis with pain -Nails palliatively debridement as below -Educated on self-care  Procedure: Nail Debridement Rationale: Pain Type of Debridement: manual, sharp debridement. Instrumentation: Nail nipper, rotary burr. Number of Nails: 9 -Examined patient. -Patient to continue soft, supportive shoe gear daily. -Toenails 1-5 left, R hallux, R 3rd toe, R 4th toe and R 5th toe debrided in length and girth without iatrogenic bleeding with sterile nail nipper and dremel.  -Corn(s) R 2nd toe pared utilizing sterile scalpel blade without complication or incident. Total number debrided=1. -Patient to report any pedal injuries to medical professional immediately. -Patient/POA to call should there be question/concern in the interim.  Return in about 3 months (around 03/10/2021).  Marzetta Board, DPM

## 2020-12-18 DIAGNOSIS — L578 Other skin changes due to chronic exposure to nonionizing radiation: Secondary | ICD-10-CM | POA: Diagnosis not present

## 2020-12-18 DIAGNOSIS — L821 Other seborrheic keratosis: Secondary | ICD-10-CM | POA: Diagnosis not present

## 2020-12-18 DIAGNOSIS — D1801 Hemangioma of skin and subcutaneous tissue: Secondary | ICD-10-CM | POA: Diagnosis not present

## 2020-12-18 DIAGNOSIS — L814 Other melanin hyperpigmentation: Secondary | ICD-10-CM | POA: Diagnosis not present

## 2020-12-31 NOTE — Telephone Encounter (Signed)
I have contacted son after I sent msg to [facility] in reference to the bill. She states that patients [employer] is showing she has a different primary insurance. I have asked the son to call [facility] with his mom and see if they can get this straightened out. He is agreeable to contact them and see if he can get this taken care of. He did thank me for reaching back out and given him guidance on how to fix this for his mom

## 2021-01-08 ENCOUNTER — Other Ambulatory Visit: Payer: Self-pay | Admitting: *Deleted

## 2021-01-08 MED ORDER — LOSARTAN POTASSIUM 100 MG PO TABS: 100.0000 mg | ORAL_TABLET | Freq: Every day | ORAL | 3 refills | Status: DC

## 2021-01-08 MED ORDER — OMEPRAZOLE 20 MG PO CPDR: 1.0000 | DELAYED_RELEASE_CAPSULE | Freq: Every day | ORAL | 3 refills | Status: DC

## 2021-01-20 ENCOUNTER — Telehealth: Payer: Self-pay | Admitting: Family Medicine

## 2021-01-20 NOTE — Telephone Encounter (Signed)
Patient's son Rebecca Perkins came to the office to speak with someone regarding an issue with insurance for this patient. Patient has two insurances: Education officer, environmental, and [employer]. [employer] recently notified the patient that they are no longer the primary insurance carrier and Medicare said they would be primary. There's been a back and forth conflict about which insurance company is primary which is leading to issues with bills being paid. Randy left you a Advertising account executive. Copy of most recently received bill is on your desk with the patient's MRN.  Please advise Rebecca Perkins at [phone].

## 2021-01-27 ENCOUNTER — Ambulatory Visit (INDEPENDENT_AMBULATORY_CARE_PROVIDER_SITE_OTHER): Payer: Medicare Other | Admitting: Ophthalmology

## 2021-01-27 ENCOUNTER — Other Ambulatory Visit: Payer: Self-pay

## 2021-01-27 ENCOUNTER — Encounter (INDEPENDENT_AMBULATORY_CARE_PROVIDER_SITE_OTHER): Payer: Self-pay | Admitting: Ophthalmology

## 2021-01-27 DIAGNOSIS — E113392 Type 2 diabetes mellitus with moderate nonproliferative diabetic retinopathy without macular edema, left eye: Secondary | ICD-10-CM

## 2021-01-27 DIAGNOSIS — I10 Essential (primary) hypertension: Secondary | ICD-10-CM | POA: Diagnosis not present

## 2021-01-27 DIAGNOSIS — H04123 Dry eye syndrome of bilateral lacrimal glands: Secondary | ICD-10-CM

## 2021-01-27 DIAGNOSIS — H3581 Retinal edema: Secondary | ICD-10-CM | POA: Diagnosis not present

## 2021-01-27 DIAGNOSIS — Z961 Presence of intraocular lens: Secondary | ICD-10-CM | POA: Diagnosis not present

## 2021-01-27 DIAGNOSIS — H35033 Hypertensive retinopathy, bilateral: Secondary | ICD-10-CM | POA: Diagnosis not present

## 2021-01-27 DIAGNOSIS — E113311 Type 2 diabetes mellitus with moderate nonproliferative diabetic retinopathy with macular edema, right eye: Secondary | ICD-10-CM

## 2021-01-27 NOTE — Progress Notes (Signed)
[facility] Note  01/27/2021     CHIEF COMPLAINT Patient presents for Retina Follow Up   HISTORY OF PRESENT ILLNESS: Rebecca Perkins is a 87 y.o. female who presents to the clinic today for:   HPI     Retina Follow Up   Patient presents with  Diabetic Retinopathy.  In right eye.  This started 4 months ago.  I, the attending physician,  performed the HPI with the patient and updated documentation appropriately.        Comments   Patient here for 4 months retina follow up for NPDR OD. Patient states vision doing fine. No eye pain.      Last edited by Bernarda Caffey, MD on 01/27/2021  1:15 PM.    Pt states vision is doing well, her son states she has been working to keep her sugar under better control, he states it is about 112 when she wakes up and doesn't get much higher than that throughout the day, she was taken off Trujeo bc it was making her sugar too low, she states she takes one shot of Trulicity on Saturday's, she is using Refresh QAM OU  Referring physician: Luberta Mutter, MD [place],  [place] [id number]  HISTORICAL INFORMATION:   Selected notes from the MEDICAL RECORD NUMBER Referred by Dr. Luberta Mutter for concern of DME   CURRENT MEDICATIONS: Current Outpatient Medications (Ophthalmic Drugs)  Medication Sig   polyvinyl alcohol (LIQUIFILM TEARS) 1.4 % ophthalmic solution 1 drop as needed for dry eyes.   No current facility-administered medications for this visit. (Ophthalmic Drugs)   Current Outpatient Medications (Other)  Medication Sig   Blood Glucose Monitoring Suppl (ONE TOUCH ULTRA SYSTEM KIT) W/DEVICE KIT 1 kit by Does not apply route once. Tests Blood sugar before meals and at bedtime   Calcium Carbonate-Vitamin D (CALTRATE 600+D PO) Take by mouth 2 (two) times daily.   clopidogrel (PLAVIX) 75 MG tablet Take 1 tablet (75 mg total) by mouth daily.   Coenzyme Q10 (COQ10 PO) Take by mouth.   Continuous  Blood Gluc Sensor (FREESTYLE LIBRE 14 DAY SENSOR) MISC USE EVERY 14 DAYS   denosumab (PROLIA) 60 MG/ML SOSY injection Inject into the skin every 6 (six) months.    donepezil (ARICEPT) 10 MG tablet Take 1 tablet (10 mg total) by mouth at bedtime.   Dulaglutide (TRULICITY) 1.5 VK/1.8MC SOPN INJECT 1.5MG (1 PEN) UNDER THE SKIN ONCE A WEEK   fenofibrate 160 MG tablet TAKE 1 TABLET BY MOUTH EVERY DAY   fluticasone (FLONASE) 50 MCG/ACT nasal spray Place 2 sprays into both nostrils daily.   hydrochlorothiazide (MICROZIDE) 12.5 MG capsule TAKE 1 CAPSULE(12.5 MG) BY MOUTH DAILY   icosapent Ethyl (VASCEPA) 1 g capsule Take 2 capsules (2 g total) by mouth 2 (two) times daily.   insulin lispro (HUMALOG) 100 UNIT/ML KwikPen Inject into the skin. Use only if blood sugar is over 200 in the am.   Insulin Pen Needle (B-D ULTRAFINE III SHORT PEN) 31G X 8 MM MISC INJECT AS DIRECTED 4 TIMES DAILY   INSULIN SYRINGE 1CC/29G 29G X 1/2" 1 ML MISC AS DIRECTED.   losartan (COZAAR) 100 MG tablet Take 1 tablet (100 mg total) by mouth daily.   metoprolol succinate (TOPROL-XL) 50 MG 24 hr tablet TAKE 1 TABLET BY MOUTH EVERY DAY WITH OR IMMEDIATELY FOLLOWING A MEAL   Multiple Vitamin (MULTIVITAMIN) tablet Take 1 tablet by mouth daily.   NONFORMULARY OR  COMPOUNDED ITEM [facility]:  Peripheral Neuropathy Cream - Bupivacaine 1%, Doxepin 3%, Gabapentin 6%, Pentoxifylline 3%, Topiramate 1%, apply 1-2 grams to affected ares 3-4 times a day prn.   omeprazole (PRILOSEC) 20 MG capsule Take 1 capsule (20 mg total) by mouth daily.   ONE TOUCH ULTRA TEST test strip USE FOUR TIMES DAILY BEFORE MEALS AND EVERY NIGHT AT BEDTIME   pantoprazole (PROTONIX) 40 MG tablet TAKE 1 TABLET BY MOUTH EVERY DAY   pioglitazone (ACTOS) 30 MG tablet TAKE 1 TABLET BY MOUTH EVERY DAY   potassium chloride SA (KLOR-CON) 20 MEQ tablet Take 1 tablet (20 mEq total) by mouth daily.   Pyridoxine HCl (VITAMIN B-6) 100 MG tablet Take 100 mg by mouth daily.    rosuvastatin (CRESTOR) 20 MG tablet TAKE 1 TABLET(20 MG) BY MOUTH DAILY   Current Facility-Administered Medications (Other)  Medication Route   denosumab (PROLIA) injection 60 mg Subcutaneous      REVIEW OF SYSTEMS: ROS   Positive for: Gastrointestinal, Neurological, Genitourinary, Musculoskeletal, HENT, Endocrine, Eyes Negative for: Constitutional, Skin, Cardiovascular, Respiratory, Psychiatric, Allergic/Imm, Heme/Lymph Last edited by Theodore Demark, COA on 01/27/2021  1:00 PM.        ALLERGIES Allergies  Allergen Reactions   Lipitor [Atorvastatin Calcium] Diarrhea   Niacin And Related Hives   Statins Diarrhea   Macrobid [Nitrofurantoin Macrocrystal] Other (See Comments)    unknown    PAST MEDICAL HISTORY Past Medical History:  Diagnosis Date   Anemia    Diabetes mellitus type 2, insulin dependent (HCC)    Diabetic retinopathy (HCC)    NPDR OU   Dyslipidemia    GERD (gastroesophageal reflux disease)    Hypertension    Hypertensive retinopathy    OU   Migraines    Osteoporosis    PVD (peripheral vascular disease) ([place])    abi .83 (L), .92 (R)   RSD (reflex sympathetic dystrophy) 2007   R wrist/hand following fx   Stroke ([place])    TIA (transient ischemic attack)    Varicose veins    Past Surgical History:  Procedure Laterality Date   ABDOMINAL HYSTERECTOMY  1988   APPENDECTOMY  1966   BREAST CYST EXCISION     CATARACT EXTRACTION Bilateral 10/2009   CHOLECYSTECTOMY  1989   EYE SURGERY Bilateral    Cat Sx OU   Several benign cyst removed     last 1 in [place] HISTORY Family History  Problem Relation Age of Onset   Stroke Mother 76   Hypertension Mother    Clotting disorder Father    Heart attack Father    Arrhythmia Sister    Stroke Brother    Thyroid disease Neg Hx     SOCIAL HISTORY Social History   Tobacco Use   Smoking status: Never   Smokeless tobacco: Never  Substance Use  Topics   Alcohol use: No   Drug use: No         OPHTHALMIC EXAM:  Base Eye Exam     Visual Acuity (Snellen - Linear)       Right Left   Dist [place] 20/25 -2 20/25   Dist ph [place] 20/25 +2 20/20 -2         Tonometry (Tonopen, 12:58 PM)       Right Left   Pressure 13 16         Pupils  Dark Light Shape React APD   Right 2 1 Round Minimal None   Left 2 1 Round Minimal None         Visual Fields (Counting fingers)       Left Right    Full Full         Extraocular Movement       Right Left    Full Full         Neuro/Psych     Oriented x3: Yes   Mood/Affect: Normal         Dilation     Both eyes: 1.0% Mydriacyl, 2.5% Phenylephrine @ 12:58 PM           Slit Lamp and Fundus Exam     Slit Lamp Exam       Right Left   Lids/Lashes Meibomian gland dysfunction, Dermatochalasis - upper lid Meibomian gland dysfunction, Dermatochalasis - upper lid   Conjunctiva/Sclera White and quiet White and quiet   Cornea 3+ Punctate epithelial erosions, Arcus, decreased TBUT, irregular epi 3+Punctate epithelial erosions, Arcus, tear film debris   Anterior Chamber Deep and quiet Deep and quiet   Iris Round and dilated, No NVI Round and dilated, No NVI   Lens PCIOL in good position with open PC PCIOL in good position with open PC   Vitreous mild Vitreous syneresis mild Vitreous syneresis         Fundus Exam       Right Left   Disc Pink and Sharp trace Pallor, Sharp rim   C/D Ratio 0.4 0.5   Macula Flat, Blunted foveal reflex, scattered Microaneurysms/IRH, cluster of IRH and exudate just inside ST arcades -- improved, light focal laser scars ST macula Flat, blunted foveal reflex, scattered MA, no edema   Vessels Vascular attenuation, mild Tortuousity, no NVE Vascular attenuation, mild Tortuousity, no NVE   Periphery Attached, scattered MA/IRH greatest posteriorly, flat, pigmented lesion superior to disc Attached, scattered DBH greatest posteriorly             IMAGING AND PROCEDURES  Imaging and Procedures for _0 @  OCT, Retina - OU - Both Eyes       Right Eye Quality was borderline. Central Foveal Thickness: 277. Progression has improved. Findings include normal foveal contour, no SRF, intraretinal hyper-reflective material, no IRF (Mild interval improvement in ST thickening / cystic changes ).   Left Eye Quality was good. Central Foveal Thickness: 274. Progression has been stable. Findings include normal foveal contour, no IRF, no SRF.   Notes *Images captured and stored on drive  Diagnosis / Impression:  OD: Mild interval improvement in ST thickening / cystic changes  OS: no DME  Clinical management:  See below  Abbreviations: NFP - Normal foveal profile. CME - cystoid macular edema. PED - pigment epithelial detachment. IRF - intraretinal fluid. SRF - subretinal fluid. EZ - ellipsoid zone. ERM - epiretinal membrane. ORA - outer retinal atrophy. ORT - outer retinal tubulation. SRHM - subretinal hyper-reflective material               ASSESSMENT/PLAN:    ICD-10-CM   1. Moderate nonproliferative diabetic retinopathy of right eye with macular edema associated with type 2 diabetes mellitus ([place])  X65.5374     2. Moderate nonproliferative diabetic retinopathy of left eye without macular edema associated with type 2 diabetes mellitus ([place])  M27.0786     3. Retinal edema  H35.81 OCT, Retina - OU - Both Eyes    4. Essential hypertension  I10     5. Hypertensive retinopathy of both eyes  H35.033     6. Pseudophakia of both eyes  Z96.1     7. Dry eyes  H04.123        1-3. Moderate nonproliferative diabetic retinopathy w/ DME, OD  - OS without DME  - A1c was 6.8 on 08.30.21  - FA (07.14.20 and 7.19.21) shows late leaking MA OU; OD with focal hyperfluorescent leakage in area of DME  - s/p focal laser OD (11.09.20)  - OCT OD: Mild interval improvement in ST thickening / cystic changes; OS no DME  - BCVA  20/25  OD; 20/20 OS  - no retinal intervention indicated or recommended at this time  - f/u 6 months, DFE, OCT  4,5. Hypertensive retinopathy OU  - discussed importance of tight BP control  - monitor  6. Pseudophakia OU  - s/p CE/IOL OU (Dr. Albina Billet)  - beautiful surgeries, doing well  - monitor  7. Dry Eyes OU (OD > OS)  - recommend artificial tears and lubricating ointment as needed    Ophthalmic Meds Ordered this visit:  No orders of the defined types were placed in this encounter.      Return in about 6 months (around 07/29/2021) for f/u NPDR OU, DFE, OCT.  There are no Patient Instructions on file for this visit.  This document serves as a record of services personally performed by Gardiner Sleeper, MD, PhD. It was created on their behalf by Estill Bakes, COT an ophthalmic technician. The creation of this record is the provider's dictation and/or activities during the visit.    Electronically signed by: Estill Bakes, COT 6.28.22 @ 12:01 AM   This document serves as a record of services personally performed by Gardiner Sleeper, MD, PhD. It was created on their behalf by San Jetty. Owens Shark, OA an ophthalmic technician. The creation of this record is the provider's dictation and/or activities during the visit.    Electronically signed by: San Jetty. Owens Shark, [place] 06.28.2022 12:01 AM   Gardiner Sleeper, M.D., Ph.D. Diseases & Surgery of the Retina and Vitreous [facility]  I have reviewed the above documentation for accuracy and completeness, and I agree with the above. Gardiner Sleeper, M.D., Ph.D. 01/28/21 12:01 AM   Abbreviations: M myopia (nearsighted); A astigmatism; H hyperopia (farsighted); P presbyopia; Mrx spectacle prescription;  CTL contact lenses; OD right eye; OS left eye; OU both eyes  XT exotropia; ET esotropia; PEK punctate epithelial keratitis; PEE punctate epithelial erosions; DES dry eye syndrome; MGD meibomian gland dysfunction; ATs artificial  tears; PFAT's preservative free artificial tears; [place] nuclear sclerotic cataract; PSC posterior subcapsular cataract; ERM epi-retinal membrane; PVD posterior vitreous detachment; RD retinal detachment; DM diabetes mellitus; DR diabetic retinopathy; NPDR non-proliferative diabetic retinopathy; PDR proliferative diabetic retinopathy; CSME clinically significant macular edema; DME diabetic macular edema; dbh dot blot hemorrhages; CWS cotton wool spot; POAG primary open angle glaucoma; C/D cup-to-disc ratio; HVF humphrey visual field; GVF goldmann visual field; OCT optical coherence tomography; IOP intraocular pressure; BRVO Branch retinal vein occlusion; CRVO central retinal vein occlusion; CRAO central retinal artery occlusion; BRAO branch retinal artery occlusion; RT retinal tear; SB scleral buckle; PPV pars plana vitrectomy; VH Vitreous hemorrhage; PRP panretinal laser photocoagulation; IVK intravitreal kenalog; VMT vitreomacular traction; MH Macular hole;  NVD neovascularization of the disc; NVE neovascularization elsewhere; AREDS age related eye disease study; ARMD age related macular degeneration; POAG primary open angle glaucoma; EBMD epithelial/anterior  basement membrane dystrophy; ACIOL anterior chamber intraocular lens; IOL intraocular lens; PCIOL posterior chamber intraocular lens; Phaco/IOL phacoemulsification with intraocular lens placement; [place] photorefractive keratectomy; LASIK laser assisted in situ keratomileusis; HTN hypertension; DM diabetes mellitus; COPD chronic obstructive pulmonary disease

## 2021-01-31 ENCOUNTER — Other Ambulatory Visit: Payer: Self-pay | Admitting: Family Medicine

## 2021-02-03 ENCOUNTER — Other Ambulatory Visit: Payer: Self-pay | Admitting: Family Medicine

## 2021-02-03 DIAGNOSIS — M7989 Other specified soft tissue disorders: Secondary | ICD-10-CM

## 2021-02-03 DIAGNOSIS — R059 Cough, unspecified: Secondary | ICD-10-CM

## 2021-02-24 ENCOUNTER — Ambulatory Visit: Payer: BC Managed Care – PPO | Admitting: Family Medicine

## 2021-03-05 ENCOUNTER — Other Ambulatory Visit: Payer: Self-pay | Admitting: Family Medicine

## 2021-03-10 ENCOUNTER — Ambulatory Visit: Payer: Medicare Other | Admitting: Podiatry

## 2021-03-10 ENCOUNTER — Other Ambulatory Visit: Payer: Self-pay

## 2021-03-10 DIAGNOSIS — B351 Tinea unguium: Secondary | ICD-10-CM | POA: Diagnosis not present

## 2021-03-10 DIAGNOSIS — R234 Changes in skin texture: Secondary | ICD-10-CM | POA: Diagnosis not present

## 2021-03-10 DIAGNOSIS — M79674 Pain in right toe(s): Secondary | ICD-10-CM

## 2021-03-10 DIAGNOSIS — M79675 Pain in left toe(s): Secondary | ICD-10-CM

## 2021-03-10 DIAGNOSIS — E1142 Type 2 diabetes mellitus with diabetic polyneuropathy: Secondary | ICD-10-CM | POA: Diagnosis not present

## 2021-03-13 ENCOUNTER — Other Ambulatory Visit: Payer: Self-pay | Admitting: Family Medicine

## 2021-03-15 ENCOUNTER — Encounter: Payer: Self-pay | Admitting: Podiatry

## 2021-03-15 NOTE — Progress Notes (Signed)
  Subjective:  Patient ID: Rebecca Perkins, female    DOB: [birth year],  MRN: [id number]  Rebecca Perkins presents to clinic today for at risk foot care with history of diabetic neuropathy and corn(s) right 2nd toe and painful thick toenails that are difficult to trim. Painful toenails interfere with ambulation. Aggravating factors include wearing enclosed shoe gear. Pain is relieved with periodic professional debridement. Painful corns are aggravated when weightbearing when wearing enclosed shoe gear. Pain is relieved with periodic professional debridement.  Patient states blood glucose was 130 mg/dl today.  Her son in law is present on today's visit.  Patient did not check blood glucose today.  PCP is [name], MD , and last visit was 11/11/2020.  Allergies  Allergen Reactions   Lipitor [Atorvastatin Calcium] Diarrhea   Niacin And Related Hives   Statins Diarrhea   Macrobid [Nitrofurantoin Macrocrystal] Other (See Comments)    unknown    Review of Systems: Negative except as noted in the HPI. Objective:   Constitutional Rebecca Perkins is a pleasant 87 y.o. Caucasian female, WD, WN in NAD. AAO x 3.   Vascular Capillary fill time to digits <3 seconds b/l lower extremities. Palpable DP pulse(s) b/l lower extremities Palpable PT pulse(s) b/l lower extremities Pedal hair absent. Lower extremity skin temperature gradient within normal limits. No pain with calf compression b/l. No cyanosis or clubbing noted.  Neurologic Normal speech. Oriented to person, place, and time. Protective sensation diminished with 10g monofilament b/l. Vibratory sensation intact b/l.  Dermatologic Pedal skin with normal turgor, texture and tone b/l lower extremities. Toenails 1-5 left, R hallux, R 3rd toe, R 4th toe, and R 5th toe elongated, discolored, dystrophic, thickened, and crumbly with subungual debris and tenderness to dorsal palpation. Superficial skin crack noted 1st webspace right foot. No  edema, no erythema, no drainage, no odor, no fluctuance . No hyperkeratotic nor porokeratotic lesions present on today's visit.  Orthopedic: Normal muscle strength 5/5 to all lower extremity muscle groups bilaterally. Clawtoe deformity R 2nd toe.   Radiographs: None Assessment:   1. Pain due to onychomycosis of toenails of both feet   2. Cracked skin on feet   3. Diabetic peripheral neuropathy associated with type 2 diabetes mellitus ([place])    Plan:  -Examined patient. -Skin crack cleansed with alcohol. Triple antibiotic ointment and band-aid applied. Son in law instructed to apply Nesoporin and fabric band-aid to area once daily until healed. He related understanding. -Patient to continue soft, supportive shoe gear daily. -Toenails 1-5 left, R hallux, R 3rd toe, R 4th toe, and R 5th toe debrided in length and girth without iatrogenic bleeding with sterile nail nipper and dremel.  -Patient to report any pedal injuries to medical professional immediately. -Continue digital toe cap to right 2nd toe daily for protection. -Patient/POA to call should there be question/concern in the interim.  Return in about 3 months (around 06/10/2021).  Marzetta Board, DPM

## 2021-03-23 NOTE — Telephone Encounter (Signed)
I have spoke with son and I had already forwarded a different telephone encounter to our billing department

## 2021-03-27 ENCOUNTER — Other Ambulatory Visit: Payer: Self-pay

## 2021-03-27 ENCOUNTER — Telehealth: Payer: Self-pay

## 2021-03-27 ENCOUNTER — Other Ambulatory Visit: Payer: BC Managed Care – PPO

## 2021-03-27 DIAGNOSIS — Z794 Long term (current) use of insulin: Secondary | ICD-10-CM

## 2021-03-27 DIAGNOSIS — N183 Chronic kidney disease, stage 3 unspecified: Secondary | ICD-10-CM | POA: Diagnosis not present

## 2021-03-27 DIAGNOSIS — I10 Essential (primary) hypertension: Secondary | ICD-10-CM | POA: Diagnosis not present

## 2021-03-27 DIAGNOSIS — Z13220 Encounter for screening for lipoid disorders: Secondary | ICD-10-CM

## 2021-03-27 DIAGNOSIS — E119 Type 2 diabetes mellitus without complications: Secondary | ICD-10-CM | POA: Diagnosis not present

## 2021-03-27 DIAGNOSIS — Z136 Encounter for screening for cardiovascular disorders: Secondary | ICD-10-CM | POA: Diagnosis not present

## 2021-03-27 NOTE — Telephone Encounter (Signed)
Noted  

## 2021-03-27 NOTE — Telephone Encounter (Signed)
Pt came in wanting to get handicap placard form filled out, and will pick up at appt next week. Form has been placed in Nurse's folder.  Cb#: [phone]

## 2021-03-28 LAB — CBC WITH DIFFERENTIAL/PLATELET
Absolute Monocytes: 581 cells/uL (ref 200–950)
Basophils Absolute: 53 cells/uL (ref 0–200)
Basophils Relative: 0.8 %
Eosinophils Absolute: 231 cells/uL (ref 15–500)
Eosinophils Relative: 3.5 %
HCT: 33.1 % — ABNORMAL LOW (ref 35.0–45.0)
Hemoglobin: 10.5 g/dL — ABNORMAL LOW (ref 11.7–15.5)
Lymphs Abs: 2897 cells/uL (ref 850–3900)
MCH: 28.8 pg (ref 27.0–33.0)
MCHC: 31.7 g/dL — ABNORMAL LOW (ref 32.0–36.0)
MCV: 90.7 fL (ref 80.0–100.0)
MPV: 10.5 fL (ref 7.5–12.5)
Monocytes Relative: 8.8 %
Neutro Abs: 2838 cells/uL (ref 1500–7800)
Neutrophils Relative %: 43 %
Platelets: 215 10*3/uL (ref 140–400)
RBC: 3.65 10*6/uL — ABNORMAL LOW (ref 3.80–5.10)
RDW: 11.8 % (ref 11.0–15.0)
Total Lymphocyte: 43.9 %
WBC: 6.6 10*3/uL (ref 3.8–10.8)

## 2021-03-28 LAB — COMPLETE METABOLIC PANEL WITH GFR
AG Ratio: 1.2 (calc) (ref 1.0–2.5)
ALT: 13 U/L (ref 6–29)
AST: 29 U/L (ref 10–35)
Albumin: 3.5 g/dL — ABNORMAL LOW (ref 3.6–5.1)
Alkaline phosphatase (APISO): 30 U/L — ABNORMAL LOW (ref 37–153)
BUN/Creatinine Ratio: 20 (calc) (ref 6–22)
BUN: 23 mg/dL (ref 7–25)
CO2: 28 mmol/L (ref 20–32)
Calcium: 9.7 mg/dL (ref 8.6–10.4)
Chloride: 107 mmol/L (ref 98–110)
Creat: 1.13 mg/dL — ABNORMAL HIGH (ref 0.60–0.95)
Globulin: 2.9 g/dL (calc) (ref 1.9–3.7)
Glucose, Bld: 145 mg/dL — ABNORMAL HIGH (ref 65–99)
Potassium: 4.5 mmol/L (ref 3.5–5.3)
Sodium: 142 mmol/L (ref 135–146)
Total Bilirubin: 0.4 mg/dL (ref 0.2–1.2)
Total Protein: 6.4 g/dL (ref 6.1–8.1)
eGFR: 47 mL/min/{1.73_m2} — ABNORMAL LOW (ref >=60)

## 2021-03-28 LAB — LIPID PANEL
Cholesterol: 130 mg/dL (ref <200)
HDL: 31 mg/dL — ABNORMAL LOW (ref >=50)
LDL Cholesterol (Calc): 73 mg/dL (calc)
Non-HDL Cholesterol (Calc): 99 mg/dL (calc) (ref <130)
Total CHOL/HDL Ratio: 4.2 (calc) (ref <5.0)
Triglycerides: 179 mg/dL — ABNORMAL HIGH (ref <150)

## 2021-03-28 LAB — HEMOGLOBIN A1C
Hgb A1c MFr Bld: 6.9 % of total Hgb — ABNORMAL HIGH (ref <5.7)
Mean Plasma Glucose: 151 mg/dL
eAG (mmol/L): 8.4 mmol/L

## 2021-04-03 ENCOUNTER — Ambulatory Visit (INDEPENDENT_AMBULATORY_CARE_PROVIDER_SITE_OTHER): Payer: Medicare Other | Admitting: Family Medicine

## 2021-04-03 ENCOUNTER — Other Ambulatory Visit: Payer: Self-pay

## 2021-04-03 ENCOUNTER — Encounter: Payer: Self-pay | Admitting: Family Medicine

## 2021-04-03 VITALS — BP 138/62 | HR 66 | Temp 98.7°F | Resp 16 | Ht 67.5 in | Wt 129.0 lb

## 2021-04-03 DIAGNOSIS — E119 Type 2 diabetes mellitus without complications: Secondary | ICD-10-CM | POA: Diagnosis not present

## 2021-04-03 DIAGNOSIS — Z0001 Encounter for general adult medical examination with abnormal findings: Secondary | ICD-10-CM | POA: Diagnosis not present

## 2021-04-03 DIAGNOSIS — Z8673 Personal history of transient ischemic attack (TIA), and cerebral infarction without residual deficits: Secondary | ICD-10-CM | POA: Diagnosis not present

## 2021-04-03 DIAGNOSIS — G0439 Other acute necrotizing hemorrhagic encephalopathy: Secondary | ICD-10-CM

## 2021-04-03 DIAGNOSIS — N183 Chronic kidney disease, stage 3 unspecified: Secondary | ICD-10-CM

## 2021-04-03 DIAGNOSIS — R413 Other amnesia: Secondary | ICD-10-CM | POA: Diagnosis not present

## 2021-04-03 DIAGNOSIS — D649 Anemia, unspecified: Secondary | ICD-10-CM

## 2021-04-03 DIAGNOSIS — Z794 Long term (current) use of insulin: Secondary | ICD-10-CM | POA: Diagnosis not present

## 2021-04-03 DIAGNOSIS — Z0000 Encounter for general adult medical examination without abnormal findings: Secondary | ICD-10-CM

## 2021-04-03 NOTE — Progress Notes (Signed)
Subjective:    Patient ID: Rebecca Perkins, female    DOB: 1929/03/31, 87 y.o.   MRN: [id number] Patient is a very sweet 87 year old Caucasian female who is here today for a complete physical exam.  She has mild to moderate dementia.  However she still lives at home alone.  She has not had any falls other than 1 in the last year.  She was bending over to get something out of the refrigerator and she lost her balance.  She did not suffer any injuries.  As result, her sons have placed cameras all around the home so that they can monitor her.  She is walking with a cane.  Memory loss seems to be slowly getting worse.  Long-term memory is still very much intact however short-term memory is progressively worsening.  Today I asked her numerous questions and she constantly looks to her son to provide the answers.  As result, at her last visit we discontinued insulin due to safety concerns.  She is now only on once weekly Trulicity coupled with her oral medications.  However her A1c only rose from 6.1-6.9.  I am ecstatic given her age at this value.  She is not seeing any blood sugar over 200 and she denies any hypoglycemic episodes.  She is due for a flu shot as well as a COVID booster however otherwise her immunizations are up-to-date Immunization History  Administered Date(s) Administered   Fluad Quad(high Dose 65+) 04/30/2019, 04/28/2020   Influenza Split 05/05/2011   Influenza, High Dose Seasonal PF 05/03/2015, 04/30/2016, 05/16/2018   Influenza,inj,Quad PF,6+ Mos 04/25/2014   Influenza-Unspecified 05/06/2012, 04/02/2013, 04/23/2016   PFIZER(Purple Top)SARS-COV-2 Vaccination 08/27/2019, 09/17/2019, 05/21/2020   Pneumococcal Conjugate-13 08/13/2013   Pneumococcal Polysaccharide-23 08/30/2016   Tdap 12/15/2016   Zoster Recombinat (Shingrix) 10/16/2016, 12/15/2016     Past Medical History:  Diagnosis Date   Anemia    Diabetes mellitus type 2, insulin dependent (HCC)    Diabetic retinopathy ([place])     NPDR OU   Dyslipidemia    GERD (gastroesophageal reflux disease)    Hypertension    Hypertensive retinopathy    OU   Migraines    Osteoporosis    PVD (peripheral vascular disease) ([place])    abi .83 (L), .92 (R)   RSD (reflex sympathetic dystrophy) 2007   R wrist/hand following fx   Stroke ([place])    TIA (transient ischemic attack)    Varicose veins    Past Surgical History:  Procedure Laterality Date   [place]   BREAST CYST EXCISION     CATARACT EXTRACTION Bilateral 10/2009   CHOLECYSTECTOMY  1989   EYE SURGERY Bilateral    Cat Sx OU   Several benign cyst removed     last 1 in [place]     Current Outpatient Medications on File Prior to Visit  Medication Sig Dispense Refill   Blood Glucose Monitoring Suppl (ONE TOUCH ULTRA SYSTEM KIT) W/DEVICE KIT 1 kit by Does not apply route once. Tests Blood sugar before meals and at bedtime 1 each 0   Calcium Carbonate-Vitamin D (CALTRATE 600+D PO) Take by mouth 2 (two) times daily.     clopidogrel (PLAVIX) 75 MG tablet TAKE 1 TABLET BY MOUTH EVERY DAY 90 tablet 3   Coenzyme Q10 (COQ10 PO) Take by mouth.     Continuous Blood Gluc Sensor (FREESTYLE LIBRE 14 DAY SENSOR)  MISC USE EVERY 14 DAYS 6 each 3   denosumab (PROLIA) 60 MG/ML SOSY injection Inject into the skin every 6 (six) months.      donepezil (ARICEPT) 10 MG tablet Take 1 tablet (10 mg total) by mouth at bedtime. 90 tablet 3   fenofibrate 160 MG tablet TAKE 1 TABLET BY MOUTH EVERY DAY 90 tablet 3   fluticasone (FLONASE) 50 MCG/ACT nasal spray SPRAY 2 SPRAYS INTO EACH NOSTRIL EVERY DAY 48 mL 2   hydrochlorothiazide (MICROZIDE) 12.5 MG capsule TAKE 1 CAPSULE(12.5 MG) BY MOUTH DAILY 90 capsule 1   icosapent Ethyl (VASCEPA) 1 g capsule Take 2 capsules (2 g total) by mouth 2 (two) times daily. 120 capsule 11   KLOR-CON M20 20 MEQ tablet TAKE 1 TABLET BY MOUTH EVERY DAY 30 tablet 3   losartan (COZAAR)  100 MG tablet Take 1 tablet (100 mg total) by mouth daily. 90 tablet 3   metoprolol succinate (TOPROL-XL) 50 MG 24 hr tablet TAKE 1 TABLET BY MOUTH EVERY DAY WITH OR IMMEDIATELY FOLLOWING A MEAL 90 tablet 1   Multiple Vitamin (MULTIVITAMIN) tablet Take 1 tablet by mouth daily.     ONE TOUCH ULTRA TEST test strip USE FOUR TIMES DAILY BEFORE MEALS AND EVERY NIGHT AT BEDTIME 150 each 5   pantoprazole (PROTONIX) 40 MG tablet TAKE 1 TABLET BY MOUTH EVERY DAY 90 tablet 1   pioglitazone (ACTOS) 30 MG tablet TAKE 1 TABLET BY MOUTH EVERY DAY 90 tablet 3   polyvinyl alcohol (LIQUIFILM TEARS) 1.4 % ophthalmic solution 1 drop as needed for dry eyes.     Pyridoxine HCl (VITAMIN B-6) 100 MG tablet Take 100 mg by mouth daily.     rosuvastatin (CRESTOR) 20 MG tablet TAKE 1 TABLET BY MOUTH EVERY DAY 90 tablet 3   TRULICITY 1.5 VO/5.3GU SOPN INJECT 1.5MG (1 PEN) UNDER THE SKIN ONCE A WEEK 2 mL 3   insulin lispro (HUMALOG) 100 UNIT/ML KwikPen Inject into the skin. Use only if blood sugar is over 200 in the am. (Patient not taking: Reported on 04/03/2021)     Insulin Pen Needle (B-D ULTRAFINE III SHORT PEN) 31G X 8 MM MISC INJECT AS DIRECTED 4 TIMES DAILY (Patient not taking: Reported on 04/03/2021) 300 each 2   INSULIN SYRINGE 1CC/29G 29G X 1/2" 1 ML MISC AS DIRECTED. (Patient not taking: Reported on 04/03/2021) 100 each 3   NONFORMULARY OR COMPOUNDED ITEM [facility]:  Peripheral Neuropathy Cream - Bupivacaine 1%, Doxepin 3%, Gabapentin 6%, Pentoxifylline 3%, Topiramate 1%, apply 1-2 grams to affected ares 3-4 times a day prn. (Patient not taking: Reported on 04/03/2021) 100 each 5   Current Facility-Administered Medications on File Prior to Visit  Medication Dose Route Frequency Provider Last Rate Last Admin   denosumab (PROLIA) injection 60 mg  60 mg Subcutaneous Q6 months [name], MD   60 mg at 11/04/20 1409   Allergies  Allergen Reactions   Lipitor [Atorvastatin Calcium] Diarrhea   Niacin And  Related Hives   Statins Diarrhea   Macrobid [Nitrofurantoin Macrocrystal] Other (See Comments)    unknown   Social History   Socioeconomic History   Marital status: Widowed    Spouse name: Not on file   Number of children: 3   Years of education: Not on file   Highest education level: Not on file  Occupational History   Occupation: Retired  Tobacco Use   Smoking status: Never   Smokeless tobacco: Never  Substance and Sexual Activity  Alcohol use: No   Drug use: No   Sexual activity: Not on file  Other Topics Concern   Not on file  Social History Narrative   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     Objective:   Physical Exam Vitals reviewed.  Constitutional:      General: She is not in acute distress.    Appearance: Normal appearance. She is well-developed. She is obese. She is not ill-appearing, toxic-appearing or diaphoretic.  HENT:     Head: Normocephalic and atraumatic.     Right Ear: Tympanic membrane and ear canal normal. There is no impacted cerumen.     Left Ear: Tympanic membrane and ear canal normal. There is no impacted cerumen.     Nose: Nose normal. No congestion or rhinorrhea.     Mouth/Throat:     Mouth: Mucous membranes are moist.     Pharynx: Oropharynx is clear. No oropharyngeal exudate or posterior oropharyngeal erythema.  Eyes:     General: No scleral icterus.       Right eye: No discharge.        Left eye: No discharge.     Conjunctiva/sclera: Conjunctivae normal.     Pupils: Pupils are equal, round, and reactive to  light.  Neck:     Vascular: No carotid bruit or JVD.  Cardiovascular:     Rate and Rhythm: Normal rate and regular rhythm.     Heart sounds: Normal heart sounds. No murmur heard.   No friction rub. No gallop.  Pulmonary:     Effort: Pulmonary effort is normal. No respiratory distress.     Breath sounds: Normal breath sounds. No stridor. No wheezing, rhonchi or rales.  Chest:     Chest wall: No tenderness.  Abdominal:     General: Bowel sounds are normal. There is no distension.     Palpations: Abdomen is soft. There is no mass.     Tenderness: There is no abdominal tenderness. There is no right CVA tenderness, left CVA tenderness, guarding or rebound.     Hernia: No hernia is present.  Musculoskeletal:     Cervical back: Neck supple. No rigidity or tenderness.     Right lower leg: No edema.     Left lower leg: No edema.  Lymphadenopathy:     Cervical: No cervical adenopathy.  Skin:    Findings: No erythema.  Neurological:     General: No focal deficit present.     Mental Status: She is alert and oriented to person, place, and time.     Cranial Nerves: No cranial nerve deficit.     Sensory: No sensory deficit.     Motor: No weakness.     Coordination: Coordination normal.     Gait: Gait normal.  Psychiatric:        Mood and Affect: Mood normal.        Behavior: Behavior normal.        Thought Content: Thought content normal.        Judgment:  Judgment normal.          Assessment & Plan:  General medical exam  Diabetes mellitus type 2, insulin dependent ([place])  Stage 3 chronic kidney disease, unspecified whether stage 3a or 3b CKD (HCC)  Memory loss  Anemia, unspecified type  History of CVA (cerebrovascular accident) Lab on 03/27/2021  Component Date Value Ref Range Status   WBC 03/27/2021 6.6  3.8 - 10.8 Thousand/uL Final   RBC 03/27/2021 3.65 (A) 3.80 - 5.10 Million/uL Final   Hemoglobin 03/27/2021 10.5 (A) 11.7 - 15.5 g/dL Final   HCT 03/27/2021 33.1 (A)  35.0 - 45.0 % Final   MCV 03/27/2021 90.7  80.0 - 100.0 fL Final   MCH 03/27/2021 28.8  27.0 - 33.0 pg Final   MCHC 03/27/2021 31.7 (A) 32.0 - 36.0 g/dL Final   RDW 03/27/2021 11.8  11.0 - 15.0 % Final   Platelets 03/27/2021 215  140 - 400 Thousand/uL Final   MPV 03/27/2021 10.5  7.5 - 12.5 fL Final   Neutro Abs 03/27/2021 2,838  1,500 - 7,800 cells/uL Final   Lymphs Abs 03/27/2021 2,897  850 - 3,900 cells/uL Final   Absolute Monocytes 03/27/2021 581  200 - 950 cells/uL Final   Eosinophils Absolute 03/27/2021 231  15 - 500 cells/uL Final   Basophils Absolute 03/27/2021 53  0 - 200 cells/uL Final   Neutrophils Relative % 03/27/2021 43  % Final   Total Lymphocyte 03/27/2021 43.9  % Final   Monocytes Relative 03/27/2021 8.8  % Final   Eosinophils Relative 03/27/2021 3.5  % Final   Basophils Relative 03/27/2021 0.8  % Final   Glucose, Bld 03/27/2021 145 (A) 65 - 99 mg/dL Final   Comment: .            Fasting reference interval . For someone without known diabetes, a glucose value >125 mg/dL indicates that they may have diabetes and this should be confirmed with a follow-up test. .    BUN 03/27/2021 23  7 - 25 mg/dL Final   Creat 03/27/2021 1.13 (A) 0.60 - 0.95 mg/dL Final   eGFR 03/27/2021 47 (A) > OR = 60 mL/min/1.34m Final   Comment: The eGFR is based on the CKD-EPI 2021 equation. To calculate  the new eGFR from a previous Creatinine or Cystatin C result, go to https://www.kidney.org/professionals/ kdoqi/gfr%5Fcalculator    BUN/Creatinine Ratio 03/27/2021 20  6 - 22 (calc) Final   Sodium 03/27/2021 142  135 - 146 mmol/L Final   Potassium 03/27/2021 4.5  3.5 - 5.3 mmol/L Final   Chloride 03/27/2021 107  98 - 110 mmol/L Final   CO2 03/27/2021 28  20 - 32 mmol/L Final   Calcium 03/27/2021 9.7  8.6 - 10.4 mg/dL Final   Total Protein 03/27/2021 6.4  6.1 - 8.1 g/dL Final   Albumin 03/27/2021 3.5 (A) 3.6 - 5.1 g/dL Final   Globulin 03/27/2021 2.9  1.9 - 3.7 g/dL (calc) Final   AG  Ratio 03/27/2021 1.2  1.0 - 2.5 (calc) Final   Total Bilirubin 03/27/2021 0.4  0.2 - 1.2 mg/dL Final   Alkaline phosphatase (APISO) 03/27/2021 30 (A) 37 - 153 U/L Final   AST 03/27/2021 29  10 - 35 U/L Final   ALT 03/27/2021 13  6 - 29 U/L Final   Hgb A1c MFr Bld 03/27/2021 6.9 (A) <5.7 % of total Hgb Final   Comment: For someone without known diabetes, a hemoglobin A1c value of 6.5% or greater indicates that they may  have  diabetes and this should be confirmed with a follow-up  test. . For someone with known diabetes, a value <7% indicates  that their diabetes is well controlled and a value  greater than or equal to 7% indicates suboptimal  control. A1c targets should be individualized based on  duration of diabetes, age, comorbid conditions, and  other considerations. . Currently, no consensus exists regarding use of hemoglobin A1c for diagnosis of diabetes for children. .    Mean Plasma Glucose 03/27/2021 151  mg/dL Final   eAG (mmol/L) 03/27/2021 8.4  mmol/L Final   Cholesterol 03/27/2021 130  <200 mg/dL Final   HDL 03/27/2021 31 (A) > OR = 50 mg/dL Final   Triglycerides 03/27/2021 179 (A) <150 mg/dL Final   LDL Cholesterol (Calc) 03/27/2021 73  mg/dL (calc) Final   Comment: Reference range: <100 . Desirable range <100 mg/dL for primary prevention;   <70 mg/dL for patients with CHD or diabetic patients  with > or = 2 CHD risk factors. Marland Kitchen LDL-C is now calculated using the Martin-Hopkins  calculation, which is a validated novel method providing  better accuracy than the Friedewald equation in the  estimation of LDL-C.  Cresenciano Genre et al. Annamaria Helling. 0160;109(32): 2061-2068  (http://education.[employer].com/faq/FAQ164)    Total CHOL/HDL Ratio 03/27/2021 4.2  <5.0 (calc) Final   Non-HDL Cholesterol (Calc) 03/27/2021 99  <130 mg/dL (calc) Final   Comment: For patients with diabetes plus 1 major ASCVD risk  factor, treating to a non-HDL-C goal of <100 mg/dL  (LDL-C of <70  mg/dL) is considered a therapeutic  option.    A1c is outstanding at 6.9.  LDL cholesterol is well below 100.  Triglycerides are good for this patient at 179.  Renal function remained stable with a creatinine of 1.19.  Liver function test are normal.  I am very happy with her blood work.  Recommended a high-dose flu shot in addition to a COVID booster as soon as they become available with the be a variant.  Would not recommend any cancer screening.  She is on Prolia and she will be due for her next shot in 1 month.  Otherwise regular anticipatory guidance is provided.  No changes in medications are necessary at this time

## 2021-04-04 ENCOUNTER — Other Ambulatory Visit: Payer: Self-pay | Admitting: Family Medicine

## 2021-04-04 DIAGNOSIS — M7989 Other specified soft tissue disorders: Secondary | ICD-10-CM

## 2021-04-04 DIAGNOSIS — R059 Cough, unspecified: Secondary | ICD-10-CM

## 2021-04-15 ENCOUNTER — Encounter: Payer: Self-pay | Admitting: *Deleted

## 2021-05-12 ENCOUNTER — Other Ambulatory Visit: Payer: Self-pay

## 2021-05-12 ENCOUNTER — Ambulatory Visit (INDEPENDENT_AMBULATORY_CARE_PROVIDER_SITE_OTHER): Payer: Medicare Other | Admitting: *Deleted

## 2021-05-12 DIAGNOSIS — Z23 Encounter for immunization: Secondary | ICD-10-CM | POA: Diagnosis not present

## 2021-05-27 ENCOUNTER — Telehealth: Payer: Self-pay | Admitting: *Deleted

## 2021-05-27 NOTE — Telephone Encounter (Signed)
Received call from patient son [name].   Reports that Elenor Legato is no longer covered by insurance, but due to dementia, he would like to keep patient on sensor.

## 2021-05-28 ENCOUNTER — Other Ambulatory Visit: Payer: Self-pay

## 2021-05-28 ENCOUNTER — Ambulatory Visit
Admission: EM | Admit: 2021-05-28 | Discharge: 2021-05-28 | Disposition: A | Discharge status: Home / Self Care | Payer: Medicare Other | Attending: Physician Assistant | Admitting: Physician Assistant

## 2021-05-28 ENCOUNTER — Encounter: Payer: Self-pay | Admitting: Emergency Medicine

## 2021-05-28 DIAGNOSIS — H1030 Unspecified acute conjunctivitis, unspecified eye: Secondary | ICD-10-CM

## 2021-05-28 MED ORDER — TOBRAMYCIN 0.3 % OP SOLN: 1.0000 [drp] | OPHTHALMIC | 0 refills | Status: AC

## 2021-05-28 NOTE — Discharge Instructions (Signed)
Cool compresses

## 2021-05-28 NOTE — ED Provider Notes (Signed)
[facility]    CSN: [id number] Arrival date & time: 05/28/21  1248      History   Chief Complaint Chief Complaint  Patient presents with   Eye Problem    HPI Rebecca Perkins is a 87 y.o. female.   The history is provided by the patient. No language interpreter was used.  Eye Problem Location:  Left eye Quality:  Stinging Severity:  Moderate Onset quality:  Gradual Duration:  2 days Timing:  Constant Progression:  Worsening Chronicity:  New Relieved by:  Nothing Worsened by:  Nothing Ineffective treatments:  None tried Associated symptoms: discharge, inflammation, itching and redness   Risk factors: no previous injury to eye and no recent URI    Past Medical History:  Diagnosis Date   Anemia    Diabetes mellitus type 2, insulin dependent ([place])    Diabetic retinopathy ([place])    NPDR OU   Dyslipidemia    GERD (gastroesophageal reflux disease)    Hypertension    Hypertensive retinopathy    OU   Migraines    Osteoporosis    PVD (peripheral vascular disease) ([place])    abi .83 (L), .92 (R)   RSD (reflex sympathetic dystrophy) 2007   R wrist/hand following fx   Stroke ([place])    TIA (transient ischemic attack)    Varicose veins     Patient Active Problem List   Diagnosis Date Noted   Bilateral impacted cerumen 02/22/2018   Conductive hearing loss, bilateral 02/22/2018   HTN (hypertension) 09/04/2017   GERD (gastroesophageal reflux disease) 09/04/2017   Diabetes mellitus type 2, insulin dependent ([place]) 09/04/2017   Anemia 09/04/2017   HLD (hyperlipidemia) 09/04/2017   CKD (chronic kidney disease) stage 3, GFR 30-59 ml/min ([place]) 09/04/2017   Aphasia 09/01/2015   TIA (transient ischemic attack) 06/17/2015   Osteoporosis 02/18/2015   Normocytic anemia 12/13/2013   Diabetes mellitus type II, uncontrolled ([place]) 12/10/2013   Pneumonia 12/10/2013   Sepsis ([place]) 12/09/2013   Type 2 diabetes mellitus with insulin deficiency ([place])    Hypertension     Hypertriglyceridemia    Dyslipidemia     Past Surgical History:  Procedure Laterality Date   ABDOMINAL HYSTERECTOMY  1988   APPENDECTOMY  1966   BREAST CYST EXCISION     CATARACT EXTRACTION Bilateral 10/2009   CHOLECYSTECTOMY  1989   EYE SURGERY Bilateral    Cat Sx OU   Several benign cyst removed     last 1 in [place]      OB History   No obstetric history on file.      Home Medications    Prior to Admission medications   Medication Sig Start Date End Date Taking? Authorizing Provider  tobramycin (TOBREX) 0.3 % ophthalmic solution Place 1 drop into the right eye every 4 (four) hours for 10 days. 05/28/21 06/07/21 Yes Caryl Ada K, PA-C  Blood Glucose Monitoring Suppl (ONE TOUCH ULTRA SYSTEM KIT) W/DEVICE KIT 1 kit by Does not apply route once. Tests Blood sugar before meals and at bedtime 07/10/14   [name], MD  Calcium Carbonate-Vitamin D (CALTRATE 600+D PO) Take by mouth 2 (two) times daily.    [provider]  clopidogrel (PLAVIX) 75 MG tablet TAKE 1 TABLET BY MOUTH EVERY DAY 02/03/21   [name], MD  Coenzyme Q10 (COQ10 PO) Take by mouth.    [provider]  Continuous Blood Gluc Sensor (  FREESTYLE LIBRE 14 DAY SENSOR) MISC USE EVERY 14 DAYS 07/02/20   [name], MD  denosumab (PROLIA) 60 MG/ML SOSY injection Inject into the skin every 6 (six) months.  10/21/17   [provider]  donepezil (ARICEPT) 10 MG tablet TAKE 1 TABLET BY MOUTH EVERYDAY AT BEDTIME 04/08/21   [name], MD  fenofibrate 160 MG tablet TAKE 1 TABLET BY MOUTH EVERY DAY 09/18/20   [name], MD  fluticasone (FLONASE) 50 MCG/ACT nasal spray SPRAY 2 SPRAYS INTO EACH NOSTRIL EVERY DAY 02/03/21   [name], MD  hydrochlorothiazide (MICROZIDE) 12.5 MG capsule TAKE 1 CAPSULE(12.5 MG) BY MOUTH DAILY 04/08/21   [name], MD  insulin lispro (HUMALOG) 100 UNIT/ML KwikPen Inject into the skin. Use  only if blood sugar is over 200 in the am. Patient not taking: No sig reported    [provider]  Insulin Pen Needle (B-D ULTRAFINE III SHORT PEN) 31G X 8 MM MISC INJECT AS DIRECTED 4 TIMES DAILY Patient not taking: No sig reported 08/21/19   [name], MD  INSULIN SYRINGE 1CC/29G 29G X 1/2" 1 ML MISC AS DIRECTED. Patient not taking: No sig reported 07/09/15   Elayne Snare, MD  KLOR-CON M20 20 MEQ tablet TAKE 1 TABLET BY MOUTH EVERY DAY 03/05/21   [name], MD  losartan (COZAAR) 100 MG tablet Take 1 tablet (100 mg total) by mouth daily. 01/08/21   [name], MD  metoprolol succinate (TOPROL-XL) 50 MG 24 hr tablet TAKE 1 TABLET BY MOUTH EVERY DAY WITH OR IMMEDIATELY FOLLOWING A MEAL 04/08/21   [name], MD  Multiple Vitamin (MULTIVITAMIN) tablet Take 1 tablet by mouth daily.    [provider]  NONFORMULARY OR COMPOUNDED ITEM [facility]:  Peripheral Neuropathy Cream - Bupivacaine 1%, Doxepin 3%, Gabapentin 6%, Pentoxifylline 3%, Topiramate 1%, apply 1-2 grams to affected ares 3-4 times a day prn. Patient not taking: No sig reported 08/23/18   Marzetta Board, DPM  ONE TOUCH ULTRA TEST test strip USE FOUR TIMES DAILY BEFORE MEALS AND EVERY NIGHT AT BEDTIME 10/06/16   [name], MD  pantoprazole (PROTONIX) 40 MG tablet TAKE 1 TABLET BY MOUTH EVERY DAY 04/08/21   [name], MD  pioglitazone (ACTOS) 30 MG tablet TAKE 1 TABLET BY MOUTH EVERY DAY 07/21/20   [name], MD  polyvinyl alcohol (LIQUIFILM TEARS) 1.4 % ophthalmic solution 1 drop as needed for dry eyes.    [provider]  Pyridoxine HCl (VITAMIN B-6) 100 MG tablet Take 100 mg by mouth daily.    [provider]  rosuvastatin (CRESTOR) 20 MG tablet TAKE 1 TABLET BY MOUTH EVERY DAY 02/03/21   [name], MD  TRULICITY 1.5 TO/6.7TI SOPN INJECT 1.$RemoveBefor'5MG'[id number]$  (1 PEN) UNDER THE SKIN ONCE A WEEK 03/13/21   [name], MD  VASCEPA 1 g capsule TAKE 1 CAPSULE  BY MOUTH TWICE A DAY 04/08/21   [name], MD    Family History Family History  Problem Relation Age of Onset   Stroke Mother 82   Hypertension Mother    Clotting disorder Father    Heart attack Father    Arrhythmia Sister    Stroke Brother    Thyroid disease Neg Hx     Social History Social History   Tobacco Use   Smoking status: Never   Smokeless tobacco: Never  Substance Use Topics   Alcohol use: No  Drug use: No     Allergies   Lipitor [atorvastatin calcium], Niacin and related, Statins, and Macrobid [nitrofurantoin macrocrystal]   Review of Systems Review of Systems  Eyes:  Positive for discharge, redness and itching.  All other systems reviewed and are negative.   Physical Exam Triage Vital Signs ED Triage Vitals  Enc Vitals Group     BP 05/28/21 1434 (!) 147/73     Pulse Rate 05/28/21 1434 72     Resp 05/28/21 1434 15     Temp 05/28/21 1434 98.7 F (37.1 C)     Temp Source 05/28/21 1434 Oral     SpO2 05/28/21 1434 93 %     Weight --      Height --      Head Circumference --      Peak Flow --      Pain Score 05/28/21 1435 0     Pain Loc --      Pain Edu? --      Excl. in [place]? --    No data found.  Updated Vital Signs BP (!) 147/73 (BP Location: Right Arm)   Pulse 72   Temp 98.7 F (37.1 C) (Oral)   Resp 15   SpO2 93%   Visual Acuity Right Eye Distance:   Left Eye Distance:   Bilateral Distance:    Right Eye Near:   Left Eye Near:    Bilateral Near:     Physical Exam Vitals reviewed.  Constitutional:      Appearance: Normal appearance.  HENT:     Mouth/Throat:     Mouth: Mucous membranes are moist.  Eyes:     Extraocular Movements: Extraocular movements intact.     Pupils: Pupils are equal, round, and reactive to light.     Comments: Injected left conjunctiva   Cardiovascular:     Rate and Rhythm: Normal rate.  Pulmonary:     Effort: Pulmonary effort is normal.  Skin:    General: Skin is warm.  Neurological:      General: No focal deficit present.     Mental Status: She is alert.  Psychiatric:        Mood and Affect: Mood normal.     UC Treatments / Results  Labs (all labs ordered are listed, but only abnormal results are displayed) Labs Reviewed - No data to display  EKG   Radiology No results found.  Procedures Procedures (including critical care time)  Medications Ordered in UC Medications - No data to display  Initial Impression / Assessment and Plan / UC Course  I have reviewed the triage vital signs and the nursing notes.  Pertinent labs & imaging results that were available during my care of the patient were reviewed by me and considered in my medical decision making (see chart for details).     MDM:  Final Clinical Impressions(s) / UC Diagnoses   Final diagnoses:  Acute conjunctivitis, unspecified acute conjunctivitis type, unspecified laterality     Discharge Instructions      Cool compresses   ED Prescriptions     Medication Sig Dispense Auth. Provider   tobramycin (TOBREX) 0.3 % ophthalmic solution Place 1 drop into the right eye every 4 (four) hours for 10 days. 5 mL Fransico [place], [place]      PDMP not reviewed this encounter.   Fransico [place], [place] 05/28/21 1724

## 2021-05-28 NOTE — ED Triage Notes (Signed)
Patient c/o eye drainage and erythema that started last night.   Patients son endorses eye crusting this morning.   Patient states " my eye lid were kind of stuck this morning and it feels like sand".   Patient hasn't tried any medications for symptoms.   History of Dry eyes.

## 2021-05-29 NOTE — Telephone Encounter (Signed)
Patient son Rebecca Perkins returned call and made aware. Agreeable to plan.

## 2021-05-29 NOTE — Telephone Encounter (Signed)
Call placed to pharmacy to inquire.   Was advised that insurance requests DM supplies be ordered through DME provider, such as [facility] or [facility].   Orders placed to [facility].   Call placed to patient son Rebecca Perkins to make aware. [place].

## 2021-05-31 ENCOUNTER — Other Ambulatory Visit: Payer: Self-pay | Admitting: Family Medicine

## 2021-06-02 ENCOUNTER — Ambulatory Visit (INDEPENDENT_AMBULATORY_CARE_PROVIDER_SITE_OTHER): Payer: Medicare Other | Admitting: Family Medicine

## 2021-06-02 ENCOUNTER — Other Ambulatory Visit: Payer: Self-pay

## 2021-06-02 VITALS — BP 118/68 | HR 87 | Temp 97.9°F | Resp 18 | Ht 67.5 in | Wt 130.0 lb

## 2021-06-02 DIAGNOSIS — R051 Acute cough: Secondary | ICD-10-CM | POA: Diagnosis not present

## 2021-06-02 DIAGNOSIS — J189 Pneumonia, unspecified organism: Secondary | ICD-10-CM | POA: Diagnosis not present

## 2021-06-02 MED ORDER — AMOXICILLIN-POT CLAVULANATE 875-125 MG PO TABS: 1.0000 | ORAL_TABLET | Freq: Two times a day (BID) | ORAL | 0 refills | Status: DC

## 2021-06-02 MED ORDER — AZITHROMYCIN 250 MG PO TABS: ORAL_TABLET | ORAL | 0 refills | Status: DC

## 2021-06-02 NOTE — Progress Notes (Signed)
Subjective:    Patient ID: Rebecca Perkins, female    DOB: May 17, 1929, 87 y.o.   MRN: [id number]  Patient has had a chronic cough for more than 6 months.  We were treating it with Flonase and pantoprazole.  However recently the cough worsened.  She is also been increasingly confused and her blood sugars have just recently in the last week spiraled out of control from less than 200/300 despite no changes in her diabetic medication.  Today on examination she has prominent right basilar crackles that sound like pneumonia.  She is coughing throughout our encounter.  She denies any chest pain shortness of breath or dyspnea on exertion.  Past Medical History:  Diagnosis Date   Anemia    Diabetes mellitus type 2, insulin dependent (HCC)    Diabetic retinopathy ([place])    NPDR OU   Dyslipidemia    GERD (gastroesophageal reflux disease)    Hypertension    Hypertensive retinopathy    OU   Migraines    Osteoporosis    PVD (peripheral vascular disease) ([place])    abi .83 (L), .92 (R)   RSD (reflex sympathetic dystrophy) 2007   R wrist/hand following fx   Stroke ([place])    TIA (transient ischemic attack)    Varicose veins    Past Surgical History:  Procedure Laterality Date   [place]   BREAST CYST EXCISION     CATARACT EXTRACTION Bilateral 10/2009   CHOLECYSTECTOMY  1989   EYE SURGERY Bilateral    Cat Sx OU   Several benign cyst removed     last 1 in [place]     Current Outpatient Medications on File Prior to Visit  Medication Sig Dispense Refill   Blood Glucose Monitoring Suppl (ONE TOUCH ULTRA SYSTEM KIT) W/DEVICE KIT 1 kit by Does not apply route once. Tests Blood sugar before meals and at bedtime 1 each 0   Calcium Carbonate-Vitamin D (CALTRATE 600+D PO) Take by mouth 2 (two) times daily.     clopidogrel (PLAVIX) 75 MG tablet TAKE 1 TABLET BY MOUTH EVERY DAY 90 tablet 3   Coenzyme Q10 (COQ10 PO) Take  by mouth.     Continuous Blood Gluc Sensor (FREESTYLE LIBRE 14 DAY SENSOR) MISC USE EVERY 14 DAYS 6 each 3   denosumab (PROLIA) 60 MG/ML SOSY injection Inject into the skin every 6 (six) months.      donepezil (ARICEPT) 10 MG tablet TAKE 1 TABLET BY MOUTH EVERYDAY AT BEDTIME 90 tablet 3   fenofibrate 160 MG tablet TAKE 1 TABLET BY MOUTH EVERY DAY 90 tablet 3   fluticasone (FLONASE) 50 MCG/ACT nasal spray SPRAY 2 SPRAYS INTO EACH NOSTRIL EVERY DAY 48 mL 2   hydrochlorothiazide (MICROZIDE) 12.5 MG capsule TAKE 1 CAPSULE(12.5 MG) BY MOUTH DAILY 90 capsule 1   icosapent Ethyl (VASCEPA) 1 g capsule TAKE 2 CAPSULES BY MOUTH 2 TIMES DAILY. 120 capsule 11   insulin lispro (HUMALOG) 100 UNIT/ML KwikPen Inject into the skin. Use only if blood sugar is over 200 in the am.     Insulin Pen Needle (B-D ULTRAFINE III SHORT PEN) 31G X 8 MM MISC INJECT AS DIRECTED 4 TIMES DAILY 300 each 2   INSULIN SYRINGE 1CC/29G 29G X 1/2" 1 ML MISC AS DIRECTED. 100 each 3   KLOR-CON M20 20 MEQ tablet TAKE 1 TABLET BY MOUTH EVERY DAY 30 tablet  3   losartan (COZAAR) 100 MG tablet Take 1 tablet (100 mg total) by mouth daily. 90 tablet 3   metoprolol succinate (TOPROL-XL) 50 MG 24 hr tablet TAKE 1 TABLET BY MOUTH EVERY DAY WITH OR IMMEDIATELY FOLLOWING A MEAL 90 tablet 1   Multiple Vitamin (MULTIVITAMIN) tablet Take 1 tablet by mouth daily.     NONFORMULARY OR COMPOUNDED ITEM [facility]:  Peripheral Neuropathy Cream - Bupivacaine 1%, Doxepin 3%, Gabapentin 6%, Pentoxifylline 3%, Topiramate 1%, apply 1-2 grams to affected ares 3-4 times a day prn. (Patient taking differently: [facility]:  Peripheral Neuropathy Cream - Bupivacaine 1%, Doxepin 3%, Gabapentin 6%, Pentoxifylline 3%, Topiramate 1%, apply 1-2 grams to affected ares 3-4 times a day prn.) 100 each 5   ONE TOUCH ULTRA TEST test strip USE FOUR TIMES DAILY BEFORE MEALS AND EVERY NIGHT AT BEDTIME 150 each 5   pantoprazole (PROTONIX) 40 MG tablet TAKE 1 TABLET BY  MOUTH EVERY DAY 90 tablet 1   pioglitazone (ACTOS) 30 MG tablet TAKE 1 TABLET BY MOUTH EVERY DAY 90 tablet 3   polyvinyl alcohol (LIQUIFILM TEARS) 1.4 % ophthalmic solution 1 drop as needed for dry eyes.     Pyridoxine HCl (VITAMIN B-6) 100 MG tablet Take 100 mg by mouth daily.     rosuvastatin (CRESTOR) 20 MG tablet TAKE 1 TABLET BY MOUTH EVERY DAY 90 tablet 3   tobramycin (TOBREX) 0.3 % ophthalmic solution Place 1 drop into the right eye every 4 (four) hours for 10 days. 5 mL 0   TRULICITY 1.5 VP/7.1GG SOPN INJECT 1.5MG (1 PEN) UNDER THE SKIN ONCE A WEEK 2 mL 3   Current Facility-Administered Medications on File Prior to Visit  Medication Dose Route Frequency Provider Last Rate Last Admin   denosumab (PROLIA) injection 60 mg  60 mg Subcutaneous Q6 months [name], MD   60 mg at 11/04/20 1409   Allergies  Allergen Reactions   Lipitor [Atorvastatin Calcium] Diarrhea   Niacin And Related Hives   Statins Diarrhea   Macrobid [Nitrofurantoin Macrocrystal] Other (See Comments)    unknown   Social History   Socioeconomic History   Marital status: Widowed    Spouse name: Not on file   Number of children: 3   Years of education: Not on file   Highest education level: Not on file  Occupational History   Occupation: Retired  Tobacco Use   Smoking status: Never   Smokeless tobacco: Never  Substance and Sexual Activity   Alcohol use: No   Drug use: No   Sexual activity: Not on file  Other Topics Concern   Not on file  Social History Narrative   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     Objective:   Physical Exam Vitals reviewed.  Constitutional:      General: She is not in acute distress.    Appearance: Normal appearance. She is well-developed. She is obese. She is not ill-appearing, toxic-appearing or diaphoretic.  HENT:     Head: Normocephalic  and atraumatic.     Right Ear: Tympanic membrane and ear canal normal. There is no impacted cerumen.     Left Ear: Tympanic membrane and ear canal normal. There is no impacted cerumen.     Nose: Nose normal. No congestion or rhinorrhea.     Mouth/Throat:     Mouth: Mucous membranes are moist.     Pharynx: Oropharynx is clear. No oropharyngeal exudate or posterior oropharyngeal erythema.  Eyes:     General: No scleral icterus.       Right eye: No discharge.        Left eye: No discharge.     Conjunctiva/sclera: Conjunctivae normal.     Pupils: Pupils are equal, round, and reactive to light.  Neck:     Vascular: No carotid bruit or JVD.  Cardiovascular:     Rate and Rhythm: Normal rate and regular rhythm.     Heart sounds: Normal heart sounds. No murmur heard.   No friction rub. No gallop.  Pulmonary:     Effort: Pulmonary effort is normal. No respiratory distress.     Breath sounds: No stridor. Rales present. No wheezing or rhonchi.    Chest:     Chest wall: No tenderness.  Abdominal:     General: Bowel sounds are normal. There is no distension.     Palpations: Abdomen is soft. There is no mass.     Tenderness: There is no abdominal tenderness. There is no right CVA tenderness, left CVA tenderness, guarding or rebound.     Hernia: No hernia is present.  Musculoskeletal:     Cervical back: Neck supple. No rigidity or tenderness.     Right lower leg: No edema.     Left lower leg: No edema.  Lymphadenopathy:     Cervical: No cervical adenopathy.  Skin:    Findings: No erythema.  Neurological:     General: No focal deficit present.     Mental Status: She is alert and  oriented to person, place, and time.     Cranial Nerves: No cranial nerve deficit.     Sensory: No sensory deficit.     Motor: No weakness.     Coordination: Coordination normal.     Gait: Gait normal.  Psychiatric:        Mood and Affect: Mood normal.        Behavior: Behavior normal.        Thought Content: Thought content normal.        Judgment: Judgment normal.          Assessment & Plan:  Acute cough - Plan: DG Chest 2 View  Pneumonia of right lower lobe due to infectious organism Patient appears to have pneumonia in the right lower lobe.  Begin Augmentin 875 mg twice daily for 10 days as well as a Z-Pak to cover atypicals.  I want the patient to go get a chest x-ray immediately.  I believe that her blood sugars could be due to the infection that she has so I recommended starting sliding scale insulin.  Son is instructed to give her 2 units of Humalog for every 50 points above 150.  I wrote out the sliding scale for the patient and her son.  If the chest x-ray confirms pneumonia, I would recommend a swallowing study as she may be aspirating causing the chronic cough.  If there is no pneumonia, I would like to get a CT scan to determine what is the source of  the crackles I am auscultating on exam.  If there is also no pneumonia, we may need to shift to a long-term insulin such as Toujeo 10 units daily to try to better manage her sugars but I believe that this is hypoglycemia related to infection

## 2021-06-03 ENCOUNTER — Other Ambulatory Visit: Payer: Self-pay | Admitting: *Deleted

## 2021-06-03 ENCOUNTER — Ambulatory Visit
Admission: RE | Admit: 2021-06-03 | Discharge: 2021-06-03 | Disposition: A | Discharge status: Home / Self Care | Payer: BC Managed Care – PPO | Source: Ambulatory Visit | Attending: Family Medicine | Admitting: Family Medicine

## 2021-06-03 DIAGNOSIS — R059 Cough, unspecified: Secondary | ICD-10-CM | POA: Diagnosis not present

## 2021-06-03 DIAGNOSIS — R051 Acute cough: Secondary | ICD-10-CM

## 2021-06-03 MED ORDER — INSULIN LISPRO (1 UNIT DIAL) 100 UNIT/ML (KWIKPEN): PEN_INJECTOR | SUBCUTANEOUS | 3 refills | Status: AC

## 2021-06-05 DIAGNOSIS — E11649 Type 2 diabetes mellitus with hypoglycemia without coma: Secondary | ICD-10-CM | POA: Diagnosis not present

## 2021-06-05 DIAGNOSIS — E1165 Type 2 diabetes mellitus with hyperglycemia: Secondary | ICD-10-CM | POA: Diagnosis not present

## 2021-06-09 ENCOUNTER — Encounter: Payer: Self-pay | Admitting: Family Medicine

## 2021-06-09 ENCOUNTER — Ambulatory Visit (INDEPENDENT_AMBULATORY_CARE_PROVIDER_SITE_OTHER): Payer: Medicare Other | Admitting: Family Medicine

## 2021-06-09 ENCOUNTER — Other Ambulatory Visit: Payer: Self-pay

## 2021-06-09 VITALS — BP 110/68 | HR 80 | Temp 98.2°F | Resp 18 | Ht 67.5 in | Wt 130.0 lb

## 2021-06-09 DIAGNOSIS — J849 Interstitial pulmonary disease, unspecified: Secondary | ICD-10-CM

## 2021-06-09 NOTE — Progress Notes (Signed)
Subjective:    Patient ID: Rebecca Perkins, female    DOB: March 13, 1929, 87 y.o.   MRN: [id number] 06/02/21 Patient has had a chronic cough for more than 6 months.  We were treating it with Flonase and pantoprazole.  However recently the cough worsened.  She is also been increasingly confused and her blood sugars have just recently in the last week spiraled out of control from less than 200/300 despite no changes in her diabetic medication.  Today on examination she has prominent right basilar crackles that sound like pneumonia.  She is coughing throughout our encounter.  She denies any chest pain shortness of breath or dyspnea on exertion.  At that time, my plan was:  Patient appears to have pneumonia in the right lower lobe.  Begin Augmentin 875 mg twice daily for 10 days as well as a Z-Pak to cover atypicals.  I want the patient to go get a chest x-ray immediately.  I believe that her blood sugars could be due to the infection that she has so I recommended starting sliding scale insulin.  Son is instructed to give her 2 units of Humalog for every 50 points above 150.  I wrote out the sliding scale for the patient and her son.  If the chest x-ray confirms pneumonia, I would recommend a swallowing study as she may be aspirating causing the chronic cough.  If there is no pneumonia, I would like to get a CT scan to determine what is the source of the crackles I am auscultating on exam.  If there is also no pneumonia, we may need to shift to a long-term insulin such as Toujeo 10 units daily to try to better manage her sugars but I believe that this is hypoglycemia related to infection  06/09/21 Chest x-ray showed patchy multifocal airspace disease in both lungs.  Despite being on both antibiotics, the patient continues to have a cough.  The cough is not changed at all.  She continues to remain afebrile.  Pulse oximetry is 95% on room air.  She is not struggling to breathe but she coughs frequently throughout  our visit.  There is no wheezing.  There is no increased work of breathing.  She denies any chest pain.  She denies any pleurisy  Past Medical History:  Diagnosis Date   Anemia    Diabetes mellitus type 2, insulin dependent (HCC)    Diabetic retinopathy ([place])    NPDR OU   Dyslipidemia    GERD (gastroesophageal reflux disease)    Hypertension    Hypertensive retinopathy    OU   Migraines    Osteoporosis    PVD (peripheral vascular disease) ([place])    abi .83 (L), .92 (R)   RSD (reflex sympathetic dystrophy) 2007   R wrist/hand following fx   Stroke ([place])    TIA (transient ischemic attack)    Varicose veins    Past Surgical History:  Procedure Laterality Date   [place]   BREAST CYST EXCISION     CATARACT EXTRACTION Bilateral 10/2009   CHOLECYSTECTOMY  1989   EYE SURGERY Bilateral    Cat Sx OU   Several benign cyst removed     last 1 in [place]     Current Outpatient Medications on File Prior to Visit  Medication Sig Dispense Refill   amoxicillin-clavulanate (AUGMENTIN) 875-125 MG tablet Take 1 tablet  by mouth 2 (two) times daily. 20 tablet 0   Blood Glucose Monitoring Suppl (ONE TOUCH ULTRA SYSTEM KIT) W/DEVICE KIT 1 kit by Does not apply route once. Tests Blood sugar before meals and at bedtime 1 each 0   Calcium Carbonate-Vitamin D (CALTRATE 600+D PO) Take by mouth 2 (two) times daily.     clopidogrel (PLAVIX) 75 MG tablet TAKE 1 TABLET BY MOUTH EVERY DAY 90 tablet 3   Coenzyme Q10 (COQ10 PO) Take by mouth.     Continuous Blood Gluc Sensor (FREESTYLE LIBRE 14 DAY SENSOR) MISC USE EVERY 14 DAYS 6 each 3   denosumab (PROLIA) 60 MG/ML SOSY injection Inject into the skin every 6 (six) months.      donepezil (ARICEPT) 10 MG tablet TAKE 1 TABLET BY MOUTH EVERYDAY AT BEDTIME 90 tablet 3   fenofibrate 160 MG tablet TAKE 1 TABLET BY MOUTH EVERY DAY 90 tablet 3   fluticasone (FLONASE) 50 MCG/ACT  nasal spray SPRAY 2 SPRAYS INTO EACH NOSTRIL EVERY DAY 48 mL 2   hydrochlorothiazide (MICROZIDE) 12.5 MG capsule TAKE 1 CAPSULE(12.5 MG) BY MOUTH DAILY 90 capsule 1   icosapent Ethyl (VASCEPA) 1 g capsule TAKE 2 CAPSULES BY MOUTH 2 TIMES DAILY. 120 capsule 11   insulin lispro (HUMALOG) 100 UNIT/ML KwikPen Inject SQ QD as directed per sliding scale-  <60 call MD, 100-150 0U, 151-200 2U, 201- 250 4U, 251- 300 6U, 301- 350 8U, 351- 400 10U 401- 450 12U,  >450 15U call MD 15 mL 3   Insulin Pen Needle (B-D ULTRAFINE III SHORT PEN) 31G X 8 MM MISC INJECT AS DIRECTED 4 TIMES DAILY 300 each 2   INSULIN SYRINGE 1CC/29G 29G X 1/2" 1 ML MISC AS DIRECTED. 100 each 3   KLOR-CON M20 20 MEQ tablet TAKE 1 TABLET BY MOUTH EVERY DAY 30 tablet 3   losartan (COZAAR) 100 MG tablet Take 1 tablet (100 mg total) by mouth daily. 90 tablet 3   metoprolol succinate (TOPROL-XL) 50 MG 24 hr tablet TAKE 1 TABLET BY MOUTH EVERY DAY WITH OR IMMEDIATELY FOLLOWING A MEAL 90 tablet 1   Multiple Vitamin (MULTIVITAMIN) tablet Take 1 tablet by mouth daily.     NONFORMULARY OR COMPOUNDED ITEM [facility]:  Peripheral Neuropathy Cream - Bupivacaine 1%, Doxepin 3%, Gabapentin 6%, Pentoxifylline 3%, Topiramate 1%, apply 1-2 grams to affected ares 3-4 times a day prn. (Patient taking differently: [facility]:  Peripheral Neuropathy Cream - Bupivacaine 1%, Doxepin 3%, Gabapentin 6%, Pentoxifylline 3%, Topiramate 1%, apply 1-2 grams to affected ares 3-4 times a day prn.) 100 each 5   ONE TOUCH ULTRA TEST test strip USE FOUR TIMES DAILY BEFORE MEALS AND EVERY NIGHT AT BEDTIME 150 each 5   pantoprazole (PROTONIX) 40 MG tablet TAKE 1 TABLET BY MOUTH EVERY DAY 90 tablet 1   pioglitazone (ACTOS) 30 MG tablet TAKE 1 TABLET BY MOUTH EVERY DAY 90 tablet 3   polyvinyl alcohol (LIQUIFILM TEARS) 1.4 % ophthalmic solution 1 drop as needed for dry eyes.     Pyridoxine HCl (VITAMIN B-6) 100 MG tablet Take 100 mg by mouth daily.      rosuvastatin (CRESTOR) 20 MG tablet TAKE 1 TABLET BY MOUTH EVERY DAY 90 tablet 3   TRULICITY 1.5 YY/5.0PT SOPN INJECT 1.5MG (1 PEN) UNDER THE SKIN ONCE A WEEK 2 mL 3   Current Facility-Administered Medications on File Prior to Visit  Medication Dose Route Frequency Provider Last Rate Last Admin   denosumab (PROLIA) injection 60  mg  60 mg Subcutaneous Q6 months [name], MD   60 mg at 11/04/20 1409   Allergies  Allergen Reactions   Lipitor [Atorvastatin Calcium] Diarrhea   Niacin And Related Hives   Statins Diarrhea   Macrobid [Nitrofurantoin Macrocrystal] Other (See Comments)    unknown   Social History   Socioeconomic History   Marital status: Widowed    Spouse name: Not on file   Number of children: 3   Years of education: Not on file   Highest education level: Not on file  Occupational History   Occupation: Retired  Tobacco Use   Smoking status: Never   Smokeless tobacco: Never  Substance and Sexual Activity   Alcohol use: No   Drug use: No   Sexual activity: Not on file  Other Topics Concern   Not on file  Social History Narrative   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     Objective:   Physical Exam Vitals reviewed.  Constitutional:      General: She is not in acute distress.    Appearance: Normal appearance. She is well-developed. She is obese. She is not ill-appearing, toxic-appearing or diaphoretic.  HENT:     Head: Normocephalic and atraumatic.     Right Ear: Tympanic membrane and ear canal normal. There is no impacted  cerumen.     Left Ear: Tympanic membrane and ear canal normal. There is no impacted cerumen.     Nose: Nose normal. No congestion or rhinorrhea.     Mouth/Throat:     Mouth: Mucous membranes are moist.     Pharynx: Oropharynx is clear. No oropharyngeal exudate or posterior oropharyngeal erythema.  Eyes:     General: No scleral icterus.       Right eye: No discharge.        Left eye: No discharge.     Conjunctiva/sclera: Conjunctivae normal.     Pupils: Pupils are equal, round, and reactive to light.  Neck:     Vascular: No carotid bruit or JVD.  Cardiovascular:     Rate and Rhythm: Normal rate and regular rhythm.     Heart sounds: Normal heart sounds. No murmur heard.   No friction rub. No gallop.  Pulmonary:     Effort: Pulmonary effort is normal. No respiratory distress.     Breath sounds: No stridor. Rales present. No wheezing or rhonchi.    Chest:     Chest wall: No tenderness.  Abdominal:     General: Bowel sounds are normal. There is no distension.     Palpations: Abdomen is soft. There is no mass.     Tenderness: There is no abdominal tenderness. There is no right CVA tenderness, left CVA tenderness, guarding or rebound.     Hernia: No hernia is present.  Musculoskeletal:     Cervical back: Neck supple. No rigidity or tenderness.     Right lower leg: No edema.     Left lower leg: No edema.  Lymphadenopathy:  Cervical: No cervical adenopathy.  Skin:    Findings: No erythema.  Neurological:     General: No focal deficit present.     Mental Status: She is alert and oriented to person, place, and time.     Cranial Nerves: No cranial nerve deficit.     Sensory: No sensory deficit.     Motor: No weakness.     Coordination: Coordination normal.     Gait: Gait normal.  Psychiatric:        Mood and Affect: Mood normal.        Behavior: Behavior normal.        Thought Content: Thought content normal.        Judgment: Judgment normal.          Assessment &  Plan:  Interstitial lung disorders ([place]) Patient is not responding to antibiotics.  Clinically she does not behave like pneumonia.  She certainly does not appear ill.  Therefore I believe that this could be underlying interstitial lung disease versus bronchiectasis.  Recommended a CT scan to further evaluate the source of the abnormal breath sounds and the chronic cough.  Await the results of the CT scan.  Depending on the results of the CT scan would likely recommend a swallowing study as well to evaluate for silent aspiration which could be causing the chronic cough per tickly if there is debris seen within the airways on the CT scan

## 2021-06-10 ENCOUNTER — Emergency Department (HOSPITAL_BASED_OUTPATIENT_CLINIC_OR_DEPARTMENT_OTHER)
Admission: EM | Admit: 2021-06-10 | Discharge: 2021-06-10 | Disposition: A | Discharge status: Home / Self Care | Payer: Medicare Other | Attending: Emergency Medicine | Admitting: Emergency Medicine

## 2021-06-10 ENCOUNTER — Emergency Department (HOSPITAL_BASED_OUTPATIENT_CLINIC_OR_DEPARTMENT_OTHER): Payer: Medicare Other

## 2021-06-10 ENCOUNTER — Other Ambulatory Visit: Payer: Self-pay

## 2021-06-10 ENCOUNTER — Encounter (HOSPITAL_BASED_OUTPATIENT_CLINIC_OR_DEPARTMENT_OTHER): Payer: Self-pay | Admitting: Obstetrics and Gynecology

## 2021-06-10 ENCOUNTER — Telehealth: Payer: Self-pay

## 2021-06-10 DIAGNOSIS — Z79899 Other long term (current) drug therapy: Secondary | ICD-10-CM | POA: Insufficient documentation

## 2021-06-10 DIAGNOSIS — I129 Hypertensive chronic kidney disease with stage 1 through stage 4 chronic kidney disease, or unspecified chronic kidney disease: Secondary | ICD-10-CM | POA: Diagnosis not present

## 2021-06-10 DIAGNOSIS — Z20822 Contact with and (suspected) exposure to covid-19: Secondary | ICD-10-CM | POA: Insufficient documentation

## 2021-06-10 DIAGNOSIS — R0602 Shortness of breath: Secondary | ICD-10-CM | POA: Diagnosis not present

## 2021-06-10 DIAGNOSIS — N189 Chronic kidney disease, unspecified: Secondary | ICD-10-CM | POA: Insufficient documentation

## 2021-06-10 DIAGNOSIS — H35039 Hypertensive retinopathy, unspecified eye: Secondary | ICD-10-CM | POA: Diagnosis not present

## 2021-06-10 DIAGNOSIS — E11319 Type 2 diabetes mellitus with unspecified diabetic retinopathy without macular edema: Secondary | ICD-10-CM | POA: Insufficient documentation

## 2021-06-10 DIAGNOSIS — R059 Cough, unspecified: Secondary | ICD-10-CM | POA: Diagnosis present

## 2021-06-10 DIAGNOSIS — Z794 Long term (current) use of insulin: Secondary | ICD-10-CM | POA: Insufficient documentation

## 2021-06-10 DIAGNOSIS — J849 Interstitial pulmonary disease, unspecified: Secondary | ICD-10-CM | POA: Diagnosis not present

## 2021-06-10 DIAGNOSIS — J9859 Other diseases of mediastinum, not elsewhere classified: Secondary | ICD-10-CM | POA: Diagnosis not present

## 2021-06-10 DIAGNOSIS — E1151 Type 2 diabetes mellitus with diabetic peripheral angiopathy without gangrene: Secondary | ICD-10-CM | POA: Insufficient documentation

## 2021-06-10 DIAGNOSIS — I10 Essential (primary) hypertension: Secondary | ICD-10-CM | POA: Diagnosis not present

## 2021-06-10 LAB — CBC WITH DIFFERENTIAL/PLATELET
Abs Immature Granulocytes: 0.01 10*3/uL (ref 0.00–0.07)
Basophils Absolute: 0.1 10*3/uL (ref 0.0–0.1)
Basophils Relative: 1 %
Eosinophils Absolute: 0.2 10*3/uL (ref 0.0–0.5)
Eosinophils Relative: 3 %
HCT: 32.2 % — ABNORMAL LOW (ref 36.0–46.0)
Hemoglobin: 10.2 g/dL — ABNORMAL LOW (ref 12.0–15.0)
Immature Granulocytes: 0 %
Lymphocytes Relative: 21 %
Lymphs Abs: 1.5 10*3/uL (ref 0.7–4.0)
MCH: 27.9 pg (ref 26.0–34.0)
MCHC: 31.7 g/dL (ref 30.0–36.0)
MCV: 88 fL (ref 80.0–100.0)
Monocytes Absolute: 0.9 10*3/uL (ref 0.1–1.0)
Monocytes Relative: 13 %
Neutro Abs: 4.3 10*3/uL (ref 1.7–7.7)
Neutrophils Relative %: 62 %
Platelets: 291 10*3/uL (ref 150–400)
RBC: 3.66 MIL/uL — ABNORMAL LOW (ref 3.87–5.11)
RDW: 12.6 % (ref 11.5–15.5)
WBC: 6.9 10*3/uL (ref 4.0–10.5)
nRBC: 0 % (ref 0.0–0.2)

## 2021-06-10 LAB — COMPREHENSIVE METABOLIC PANEL
ALT: 15 U/L (ref 0–44)
AST: 31 U/L (ref 15–41)
Albumin: 3.2 g/dL — ABNORMAL LOW (ref 3.5–5.0)
Alkaline Phosphatase: 40 U/L (ref 38–126)
Anion gap: 7 (ref 5–15)
BUN: 18 mg/dL (ref 8–23)
CO2: 26 mmol/L (ref 22–32)
Calcium: 10 mg/dL (ref 8.9–10.3)
Chloride: 105 mmol/L (ref 98–111)
Creatinine, Ser: 1.08 mg/dL — ABNORMAL HIGH (ref 0.44–1.00)
GFR, Estimated: 49 mL/min — ABNORMAL LOW (ref >60)
Glucose, Bld: 235 mg/dL — ABNORMAL HIGH (ref 70–99)
Potassium: 4.3 mmol/L (ref 3.5–5.1)
Sodium: 138 mmol/L (ref 135–145)
Total Bilirubin: 0.4 mg/dL (ref 0.3–1.2)
Total Protein: 7.5 g/dL (ref 6.5–8.1)

## 2021-06-10 LAB — LACTIC ACID, PLASMA
Lactic Acid, Venous: 2 mmol/L (ref 0.5–1.9)
Lactic Acid, Venous: 1.8 mmol/L (ref 0.5–1.9)

## 2021-06-10 LAB — RESP PANEL BY RT-PCR (FLU A&B, COVID) ARPGX2
Influenza A by PCR: NEGATIVE
Influenza B by PCR: NEGATIVE
SARS Coronavirus 2 by RT PCR: NEGATIVE

## 2021-06-10 LAB — BRAIN NATRIURETIC PEPTIDE: B Natriuretic Peptide: 113.6 pg/mL — ABNORMAL HIGH (ref 0.0–100.0)

## 2021-06-10 MED ORDER — IPRATROPIUM-ALBUTEROL 0.5-2.5 (3) MG/3ML IN SOLN
3.0000 mL | Freq: Once | RESPIRATORY_TRACT | Status: AC
  Administered 2021-06-10: 3 mL via RESPIRATORY_TRACT
  Filled 2021-06-10: qty 3

## 2021-06-10 MED ORDER — BENZONATATE 100 MG PO CAPS: 100.0000 mg | ORAL_CAPSULE | Freq: Three times a day (TID) | ORAL | 0 refills | Status: DC

## 2021-06-10 MED ORDER — IPRATROPIUM-ALBUTEROL 0.5-2.5 (3) MG/3ML IN SOLN: 3.0000 mL | RESPIRATORY_TRACT | Status: DC

## 2021-06-10 MED ORDER — IOHEXOL 300 MG/ML  SOLN
75.0000 mL | Freq: Once | INTRAMUSCULAR | Status: AC | PRN
  Administered 2021-06-10: 75 mL via INTRAVENOUS

## 2021-06-10 MED ORDER — METHYLPREDNISOLONE SODIUM SUCC 125 MG IJ SOLR
125.0000 mg | Freq: Once | INTRAMUSCULAR | Status: AC
  Administered 2021-06-10: 125 mg via INTRAVENOUS
  Filled 2021-06-10: qty 2

## 2021-06-10 MED ORDER — ALBUTEROL SULFATE HFA 108 (90 BASE) MCG/ACT IN AERS: 2.0000 | INHALATION_SPRAY | Freq: Once | RESPIRATORY_TRACT | Status: DC

## 2021-06-10 MED ORDER — SODIUM CHLORIDE 0.9 % IV SOLN
1.0000 g | Freq: Once | INTRAVENOUS | Status: AC
  Administered 2021-06-10: 1 g via INTRAVENOUS
  Filled 2021-06-10: qty 10

## 2021-06-10 MED ORDER — SODIUM CHLORIDE 0.9 % IV SOLN
500.0000 mg | Freq: Once | INTRAVENOUS | Status: AC
  Administered 2021-06-10: 500 mg via INTRAVENOUS
  Filled 2021-06-10: qty 500

## 2021-06-10 MED ORDER — PREDNISONE 10 MG PO TABS: 40.0000 mg | ORAL_TABLET | Freq: Every day | ORAL | 0 refills | Status: AC

## 2021-06-10 NOTE — ED Triage Notes (Addendum)
Patient reports to the ER for cough and congestion. Patient has been being treated for pneumonia with a z-pack. Patient was diagnosed with bronchiectasis following a recent visit. Patient reportedly coughed x3.5 hours. Patient continued to cough this morning and had continued congestion

## 2021-06-10 NOTE — Telephone Encounter (Signed)
Pt's son came in stating that pt's breathing issue had gotten worse since pt was seen yesterday, and that they have decided to take pt to an UC to be checked out. Pt's son just wanted to come in and make pcp.

## 2021-06-10 NOTE — ED Provider Notes (Signed)
[facility]ERG[facility] DEPT Provider Note   CSN: [id number] Arrival date & time: 06/10/21  1403     History Chief Complaint  Patient presents with   Cough    BARRY CULVERHOUSE is a 87 y.o. female.  HPI     10 days ago seen by Dr. Dewayne Shorter [place] Initially started an amoxicillin and azithromycin Worsening symptoms, seen yesterday, thought was bronchiectasis, had scheduled outpt CT Coughing up sort of milky white phlegm Last night had severe coughing spell for about 3-4 hours and wanted to be seen No fever Shortness of breath sometimes, more when moving around Not really having chest pain No nausea, vomiting, diarrhea, black or blood stools, abdominal pain Rhinorrhea, no sore throat  No known sick contacts  No hx of lung disease or cigarettes   Past Medical History:  Diagnosis Date   Anemia    Diabetes mellitus type 2, insulin dependent ([place])    Diabetic retinopathy ([place])    NPDR OU   Dyslipidemia    GERD (gastroesophageal reflux disease)    Hypertension    Hypertensive retinopathy    OU   Migraines    Osteoporosis    PVD (peripheral vascular disease) ([place])    abi .83 (L), .92 (R)   RSD (reflex sympathetic dystrophy) 2007   R wrist/hand following fx   Stroke [facility])    TIA (transient ischemic attack)    Varicose veins     Patient Active Problem List   Diagnosis Date Noted   Bilateral impacted cerumen 02/22/2018   Conductive hearing loss, bilateral 02/22/2018   HTN (hypertension) 09/04/2017   GERD (gastroesophageal reflux disease) 09/04/2017   Diabetes mellitus type 2, insulin dependent ([place]) 09/04/2017   Anemia 09/04/2017   HLD (hyperlipidemia) 09/04/2017   CKD (chronic kidney disease) stage 3, GFR 30-59 ml/min (HCC) 09/04/2017   Aphasia 09/01/2015   TIA (transient ischemic attack) 06/17/2015   Osteoporosis 02/18/2015   Normocytic anemia 12/13/2013   Diabetes mellitus type II, uncontrolled ([place]) 12/10/2013   Pneumonia 12/10/2013    Sepsis ([place]) 12/09/2013   Type 2 diabetes mellitus with insulin deficiency (HCC)    Hypertension    Hypertriglyceridemia    Dyslipidemia     Past Surgical History:  Procedure Laterality Date   ABDOMINAL HYSTERECTOMY  1988   APPENDECTOMY  1966   BREAST CYST EXCISION     CATARACT EXTRACTION Bilateral 10/2009   CHOLECYSTECTOMY  1989   EYE SURGERY Bilateral    Cat Sx OU   Several benign cyst removed     last 1 in [place]       OB History     Gravida      Para      Term      Preterm      AB      Living  3      SAB      IAB      Ectopic      Multiple      Live Births              Family History  Problem Relation Age of Onset   Stroke Mother 14   Hypertension Mother    Clotting disorder Father    Heart attack Father    Arrhythmia Sister    Stroke Brother    Thyroid disease Neg Hx     Social History   Tobacco Use   Smoking  status: Never   Smokeless tobacco: Never  Substance Use Topics   Alcohol use: No   Drug use: No    Home Medications Prior to Admission medications   Medication Sig Start Date End Date Taking? Authorizing Provider  benzonatate (TESSALON) 100 MG capsule Take 1 capsule (100 mg total) by mouth every 8 (eight) hours. 06/10/21  Yes Alvira Monday, MD  predniSONE (DELTASONE) 10 MG tablet Take 4 tablets (40 mg total) by mouth daily for 4 days. 06/10/21 06/14/21 Yes Alvira Monday, MD  amoxicillin-clavulanate (AUGMENTIN) 875-125 MG tablet Take 1 tablet by mouth 2 (two) times daily. 06/02/21   Donita Brooks, MD  Blood Glucose Monitoring Suppl (ONE TOUCH ULTRA SYSTEM KIT) W/DEVICE KIT 1 kit by Does not apply route once. Tests Blood sugar before meals and at bedtime 07/10/14   Donita Brooks, MD  Calcium Carbonate-Vitamin D (CALTRATE 600+D PO) Take by mouth 2 (two) times daily.    [provider]  clopidogrel (PLAVIX) 75 MG tablet TAKE 1 TABLET BY MOUTH EVERY DAY 02/03/21   Donita Brooks, MD  Coenzyme Q10 (COQ10 PO) Take by mouth.    [provider]  Continuous Blood Gluc Sensor (FREESTYLE LIBRE 14 DAY SENSOR) MISC USE EVERY 14 DAYS 07/02/20   Donita Brooks, MD  denosumab (PROLIA) 60 MG/ML SOSY injection Inject into the skin every 6 (six) months.  10/21/17   [provider]  donepezil (ARICEPT) 10 MG tablet TAKE 1 TABLET BY MOUTH EVERYDAY AT BEDTIME 04/08/21   Donita Brooks, MD  fenofibrate 160 MG tablet TAKE 1 TABLET BY MOUTH EVERY DAY 09/18/20   Donita Brooks, MD  fluticasone (FLONASE) 50 MCG/ACT nasal spray SPRAY 2 SPRAYS INTO EACH NOSTRIL EVERY DAY 02/03/21   Donita Brooks, MD  hydrochlorothiazide (MICROZIDE) 12.5 MG capsule TAKE 1 CAPSULE(12.5 MG) BY MOUTH DAILY 04/08/21   Donita Brooks, MD  icosapent Ethyl (VASCEPA) 1 g capsule TAKE 2 CAPSULES BY MOUTH 2 TIMES DAILY. 06/01/21   Donita Brooks, MD  insulin lispro (HUMALOG) 100 UNIT/ML KwikPen Inject SQ QD as directed per sliding scale-  <60 call MD, 100-150 0U, 151-200 2U, 201- 250 4U, 251- 300 6U, 301- 350 8U, 351- 400 10U 401- 450 12U,  >450 15U call MD 06/03/21   Donita Brooks, MD  Insulin Pen Needle (B-D ULTRAFINE III SHORT PEN) 31G X 8 MM MISC INJECT AS DIRECTED 4 TIMES DAILY 08/21/19   Donita Brooks, MD  INSULIN SYRINGE 1CC/29G 29G X 1/2" 1 ML MISC AS DIRECTED. 07/09/15   Reather Littler, MD  KLOR-CON M20 20 MEQ tablet TAKE 1 TABLET BY MOUTH EVERY DAY 03/05/21   Donita Brooks, MD  losartan (COZAAR) 100 MG tablet Take 1 tablet (100 mg total) by mouth daily. 01/08/21   Donita Brooks, MD  metoprolol succinate (TOPROL-XL) 50 MG 24 hr tablet TAKE 1 TABLET BY MOUTH EVERY DAY WITH OR IMMEDIATELY FOLLOWING A MEAL 04/08/21   Donita Brooks, MD  Multiple Vitamin (MULTIVITAMIN) tablet Take 1 tablet by mouth daily.    [provider]  NONFORMULARY OR COMPOUNDED ITEM [facility]:  Peripheral Neuropathy Cream - Bupivacaine 1%, Doxepin 3%, Gabapentin 6%, Pentoxifylline 3%,  Topiramate 1%, apply 1-2 grams to affected ares 3-4 times a day prn. Patient taking differently: [facility]:  Peripheral Neuropathy Cream - Bupivacaine 1%, Doxepin 3%, Gabapentin 6%, Pentoxifylline 3%, Topiramate 1%, apply 1-2 grams to affected ares 3-4 times a day prn. 08/23/18  Marzetta Board, DPM  ONE TOUCH ULTRA TEST test strip USE FOUR TIMES DAILY BEFORE MEALS AND EVERY NIGHT AT BEDTIME 10/06/16   [name], MD  pantoprazole (PROTONIX) 40 MG tablet TAKE 1 TABLET BY MOUTH EVERY DAY 04/08/21   [name], MD  pioglitazone (ACTOS) 30 MG tablet TAKE 1 TABLET BY MOUTH EVERY DAY 07/21/20   [name], MD  polyvinyl alcohol (LIQUIFILM TEARS) 1.4 % ophthalmic solution 1 drop as needed for dry eyes.    [provider]  Pyridoxine HCl (VITAMIN B-6) 100 MG tablet Take 100 mg by mouth daily.    [provider]  rosuvastatin (CRESTOR) 20 MG tablet TAKE 1 TABLET BY MOUTH EVERY DAY 02/03/21   [name], MD  TRULICITY 1.5 FW/2.6VZ SOPN INJECT 1.$RemoveBefor'5MG'VPqAGSjHdUIA$  (1 PEN) UNDER THE SKIN ONCE A WEEK 03/13/21   [name], MD    Allergies    Lipitor [atorvastatin calcium], Niacin and related, Statins, and Macrobid [nitrofurantoin macrocrystal]  Review of Systems   Review of Systems  Constitutional:  Positive for fatigue. Negative for fever.  HENT:  Positive for congestion and rhinorrhea.   Respiratory:  Positive for cough and shortness of breath.   Cardiovascular:  Negative for chest pain.  Gastrointestinal:  Negative for abdominal pain, constipation, nausea and vomiting.  Musculoskeletal:  Negative for back pain.  Skin:  Negative for rash.   Physical Exam Updated Vital Signs BP (!) 138/49   Pulse 85   Temp 98.2 F (36.8 C)   Resp 12   SpO2 100%   Physical Exam Vitals and nursing note reviewed.  Constitutional:      General: She is not in acute distress.    Appearance: She is well-developed. She is not diaphoretic.  HENT:     Head: Normocephalic  and atraumatic.  Eyes:     Conjunctiva/sclera: Conjunctivae normal.  Cardiovascular:     Rate and Rhythm: Normal rate and regular rhythm.     Heart sounds: Normal heart sounds. No murmur heard.   No friction rub. No gallop.  Pulmonary:     Effort: Pulmonary effort is normal. No respiratory distress.     Breath sounds: Wheezing and rales present.  Abdominal:     General: There is no distension.     Palpations: Abdomen is soft.     Tenderness: There is no abdominal tenderness. There is no guarding.  Musculoskeletal:        General: No tenderness.     Cervical back: Normal range of motion.  Skin:    General: Skin is warm and dry.     Findings: No erythema or rash.  Neurological:     Mental Status: She is alert and oriented to person, place, and time.    ED Results / Procedures / Treatments   Labs (all labs ordered are listed, but only abnormal results are displayed) Labs Reviewed  CBC WITH DIFFERENTIAL/PLATELET - Abnormal; Notable for the following components:      Result Value   RBC 3.66 (*)    Hemoglobin 10.2 (*)    HCT 32.2 (*)    All other components within normal limits  COMPREHENSIVE METABOLIC PANEL - Abnormal; Notable for the following components:   Glucose, Bld 235 (*)    Creatinine, Ser 1.08 (*)    Albumin 3.2 (*)    GFR, Estimated 49 (*)    All other components within normal limits  BRAIN NATRIURETIC PEPTIDE - Abnormal; Notable for the following components:  B Natriuretic Peptide 113.6 (*)    All other components within normal limits  LACTIC ACID, PLASMA - Abnormal; Notable for the following components:   Lactic Acid, Venous 2.0 (*)    All other components within normal limits  RESP PANEL BY RT-PCR (FLU A&B, COVID) [id number]  CULTURE, BLOOD (ROUTINE X 2)  CULTURE, BLOOD (ROUTINE X 2)  LACTIC ACID, PLASMA    EKG EKG Interpretation  Date/Time:  Wednesday June 10 2021 15:45:22 EST Ventricular Rate:  71 PR Interval:  177 QRS Duration: 88 QT  Interval:  384 QTC Calculation: 418 R Axis:   16 Text Interpretation: Sinus rhythm Low voltage, precordial leads LVH by voltage Borderline T abnormalities, anterior leads TW abnormalities new in anterior leads, no other significant changes Confirmed by Gareth Morgan [phone]) on 06/10/2021 3:50:31 PM  Radiology CT Chest W Contrast  Result Date: 06/10/2021 CLINICAL DATA:  Cough, persistent PCP ordered to evaluate for bronchiectasis, concern for persistent pneumonia despite abx vs ILD EXAM: CT CHEST WITH CONTRAST TECHNIQUE: Multidetector CT imaging of the chest was performed during intravenous contrast administration. CONTRAST:  70mL OMNIPAQUE IOHEXOL 300 MG/ML  SOLN COMPARISON:  None. FINDINGS: Cardiovascular: Normal heart size. No significant pericardial effusion. The thoracic aorta is normal in caliber. Severe atherosclerotic plaque of the thoracic aorta. At least 2 vessel coronary artery calcifications. The main pulmonary artery is normal in caliber. No central or proximal segmental pulmonary embolus. Mediastinum/Nodes: There is a well circumscribed 3.4 x 2.6 cm hypodense lesion within the anterior mediastinum (2:29). Multiple prominent but nonenlarged mediastinal lymph nodes. Prominent interlobular left pulmonary lymph nodes. There is an enlarged 1.3 cm subcarinal lymph node (2:52). No enlarged hilar or axillary lymph nodes. Thyroid gland, trachea, and esophagus demonstrate no significant findings. Lungs/Pleura: Biapical pleural/pulmonary scarring. Peripheral trace to mild reticulations. Vague superimposed ground-glass airspace opacities. No definite focal consolidation. A 0.7 cm right upper lobe lobe pulmonary nodule. A triangular 0.9 x 0.4 cm subpleural nodule along the minor fissure that likely represents an intrapulmonary lymph node. No pulmonary mass. No pleural effusion. No pneumothorax. Upper Abdomen: Status post cholecystectomy. Musculoskeletal: No chest wall abnormality. No suspicious lytic or  blastic osseous lesions. No acute displaced fracture. Multilevel degenerative changes of the spine. L1 compression fracture with greater than 70% height loss. Severe degenerative changes of the right shoulder. IMPRESSION: 1. A 3.4 x 2.6 cm well-circumscribed hypodense mass within the anterior mediastinum. Finding could represent a thymoma. Comparison with prior cross-sectional imaging would be of value. 2. A 1.3 cm enlarged subcarinal lymph node. Prominent left interlobular lymph nodes. 3. Trace to mild pulmonary fibrosis. Developing superimposed infection/inflammation not excluded. 4. Aortic Atherosclerosis (ICD10-I70.0)- severe with at least 2 vessel coronary artery calcifications. Electronically Signed   By: Iven Finn M.D.   On: 06/10/2021 17:29    Procedures Procedures   Medications Ordered in ED Medications  cefTRIAXone (ROCEPHIN) 1 g in sodium chloride 0.9 % 100 mL IVPB (0 g Intravenous Stopped 06/10/21 1710)  azithromycin (ZITHROMAX) 500 mg in sodium chloride 0.9 % 250 mL IVPB (500 mg Intravenous New Bag/Given 06/10/21 1710)  methylPREDNISolone sodium succinate (SOLU-MEDROL) 125 mg/2 mL injection 125 mg (125 mg Intravenous Given 06/10/21 1611)  ipratropium-albuterol (DUONEB) 0.5-2.5 (3) MG/3ML nebulizer solution 3 mL (3 mLs Nebulization Given 06/10/21 1557)  iohexol (OMNIPAQUE) 300 MG/ML solution 75 mL (75 mLs Intravenous Contrast Given 06/10/21 1654)    ED Course  I have reviewed the triage vital signs and the nursing notes.  Pertinent labs & imaging results that  were available during my care of the patient were reviewed by me and considered in my medical decision making (see chart for details).    MDM Rules/Calculators/A&P                           87yo female with history of DM, dyslipidemia, hypertension, PVD, stroke, TIA, CKD who presents with over 10 days of cough, treated as an outpatient for pneumonia without improvement.  COVID/flu negative.  BNP 113, no clear signs of CHF on  exam. CMP without significant findings. No leukocytosis.  Lactate 2, 1.8 on repeat.  PCP had planned on CT w contrast to evaluate for ILD and given prior cxr ?mulifocal pneumonia but clinically not fitting, ordered CT chest W contrast for further evaluation of pneumonia vs chf vs ILD.  CT shows anterior mediastinal mass, possible thymoma.  Enlarged lympph nodes, trace to mild pulmonary fibrosis.  Cannot exclude developing infection/inflammation.  Given over 10 days of symptoms, no fever, no leukocytosis, has undergone trial of abx doub infectious etiology.  Symptoms may be secondary to mediastinal mass, pulmonary fibrosis. Do not feel admission for abx/failure of outpt abx for pneumonia is appropriate given clinical picture.  Wheezing on exam which improved with duoneb.  Will give albuterol inhaler, prednisone, pulmonology follow up. Patient discharged in stable condition with understanding of reasons to return.   Final Clinical Impression(s) / ED Diagnoses Final diagnoses:  Interstitial lung disease ([place])  Mediastinal mass    Rx / DC Orders ED Discharge Orders          Ordered    Ambulatory referral to Pulmonology        06/10/21 1844    predniSONE (DELTASONE) 10 MG tablet  Daily        06/10/21 1844    benzonatate (TESSALON) 100 MG capsule  Every 8 hours        06/10/21 1844             Gareth Morgan, MD 06/11/21 1023

## 2021-06-10 NOTE — ED Notes (Signed)
Blood Cultures drawn before antibotics

## 2021-06-10 NOTE — Telephone Encounter (Signed)
Pt currently in ED at [facility] for evaluation.

## 2021-06-11 ENCOUNTER — Encounter: Payer: Self-pay | Admitting: Student

## 2021-06-11 ENCOUNTER — Ambulatory Visit (INDEPENDENT_AMBULATORY_CARE_PROVIDER_SITE_OTHER): Payer: Medicare Other | Admitting: Student

## 2021-06-11 VITALS — BP 136/62 | HR 86 | Temp 98.2°F | Ht 64.0 in | Wt 131.8 lb

## 2021-06-11 DIAGNOSIS — J849 Interstitial pulmonary disease, unspecified: Secondary | ICD-10-CM

## 2021-06-11 DIAGNOSIS — J9859 Other diseases of mediastinum, not elsewhere classified: Secondary | ICD-10-CM | POA: Diagnosis not present

## 2021-06-11 MED ORDER — ALBUTEROL SULFATE HFA 108 (90 BASE) MCG/ACT IN AERS: 1.0000 | INHALATION_SPRAY | Freq: Two times a day (BID) | RESPIRATORY_TRACT | 11 refills | Status: AC

## 2021-06-11 NOTE — Patient Instructions (Signed)
-   Labs today including blood draw and sputum collection (if able) - Finish antibiotics and steroids - You will be called to schedule PET/CT scan and we'll know more after that about whether or not we should pursue biopsy from outside vs bronchoscopy

## 2021-06-11 NOTE — Telephone Encounter (Signed)
Pt's son came back in today 11/10 stating that they have had a ct scan done with and w/o contrast, they are headed to pulmonary dr today and they have a found a medistinal mass that may be causing the cough, and they will determine this later today. Pt's son just wanted to keep pcp aware of these changes.  Cb#: [id number]

## 2021-06-11 NOTE — Progress Notes (Addendum)
Synopsis: Referred for pulmonary fibrosis, anterior mediastinal mass by [name], MD  Subjective:   PATIENT ID: Rebecca Perkins GENDER: female DOB: [birth year], MRN: [id number]  Chief Complaint  Patient presents with   Consult    CT 06/10/2021.    87yF with history of DM2, GERD, stroke, never smoker who is referred for pulmonary fibrosis, mediastinal mass.  She says she's feeling overall pretty well except for cough, some dyspnea. Has had cough, dyspnea for months, especially over last 2-3 weeks. Sometimes productive. Yellow. No hemoptysis, fever, weight loss, night sweats, CP. No change in vision, double vision. She hasn't noticed marked onset of weakness with standing or reaching overhead. Went to ED yesterday and was diagnosed with pneumonia, given steroids, ABX, and referred to our clinic with concerns about pulmonary fibrosis and mediastinal mass.   She does intermittently have solid/liquid food dysphagia but no rashes, oral/nasal ulcers, arthritis with morning stiffness.    Otherwise pertinent review of systems is negative.  No family history of lung disease  Never smoker  Past Medical History:  Diagnosis Date   Anemia    Diabetes mellitus type 2, insulin dependent ([place])    Diabetic retinopathy ([place])    NPDR OU   Dyslipidemia    GERD (gastroesophageal reflux disease)    Hypertension    Hypertensive retinopathy    OU   Migraines    Osteoporosis    Pneumonia    PVD (peripheral vascular disease) ([place])    abi .53 (L), .92 (R)   RSD (reflex sympathetic dystrophy) 2007   R wrist/hand following fx   Stroke ([place])    TIA (transient ischemic attack)    Varicose veins      Family History  Problem Relation Age of Onset   Stroke Mother 12   Hypertension Mother    Clotting disorder Father    Heart attack Father    Arrhythmia Sister    Stroke Brother    Thyroid disease Neg Hx      Past Surgical History:  Procedure Laterality Date   ABDOMINAL HYSTERECTOMY   1988   APPENDECTOMY  1966   BREAST CYST EXCISION     CATARACT EXTRACTION Bilateral 10/2009   CHOLECYSTECTOMY  1989   EYE SURGERY Bilateral    Cat Sx OU   Several benign cyst removed     last 1 in 1972   TUBAL LIGATION     UMBILICAL HERNIA REPAIR      Social History   Socioeconomic History   Marital status: Widowed    Spouse name: Not on file   Number of children: 3   Years of education: Not on file   Highest education level: Not on file  Occupational History   Occupation: Retired  Tobacco Use   Smoking status: Never   Smokeless tobacco: Never  Vaping Use   Vaping Use: Not on file  Substance and Sexual Activity   Alcohol use: No   Drug use: No   Sexual activity: Not Currently  Other Topics Concern   Not on file  Social History Narrative   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   Married , lives with spouse of 63 y (03/2011)   Social Determinants of Health   Financial Resource Strain: Not on file  Food Insecurity: Not on file  Transportation Needs: Not on file  Physical Activity: Not on file  Stress: Not on file  Social Connections: Not on file  Intimate Partner Violence:  Not on file     Allergies  Allergen Reactions   Lipitor [Atorvastatin Calcium] Diarrhea   Niacin And Related Hives   Statins Diarrhea   Macrobid [Nitrofurantoin Macrocrystal] Other (See Comments)    unknown     Outpatient Medications Prior to Visit  Medication Sig Dispense Refill   amoxicillin-clavulanate (AUGMENTIN) 875-125 MG tablet Take 1 tablet by mouth 2 (two) times daily. 20 tablet 0   benzonatate (TESSALON) 100 MG capsule Take 1 capsule (100 mg total) by mouth every 8 (eight) hours. 21 capsule 0   Blood Glucose Monitoring Suppl (ONE TOUCH ULTRA SYSTEM KIT) W/DEVICE KIT 1 kit by Does not apply route once. Tests Blood sugar before meals and at bedtime 1 each 0   Calcium Carbonate-Vitamin D (CALTRATE 600+D PO) Take by mouth 2 (two) times daily.      clopidogrel (PLAVIX) 75 MG tablet TAKE 1 TABLET BY MOUTH EVERY DAY 90 tablet 3   Coenzyme Q10 (COQ10 PO) Take by mouth.     Continuous Blood Gluc Sensor (FREESTYLE LIBRE 14 DAY SENSOR) MISC USE EVERY 14 DAYS 6 each 3   denosumab (PROLIA) 60 MG/ML SOSY injection Inject into the skin every 6 (six) months.      donepezil (ARICEPT) 10 MG tablet TAKE 1 TABLET BY MOUTH EVERYDAY AT BEDTIME 90 tablet 3   fenofibrate 160 MG tablet TAKE 1 TABLET BY MOUTH EVERY DAY 90 tablet 3   fluticasone (FLONASE) 50 MCG/ACT nasal spray SPRAY 2 SPRAYS INTO EACH NOSTRIL EVERY DAY 48 mL 2   hydrochlorothiazide (MICROZIDE) 12.5 MG capsule TAKE 1 CAPSULE(12.5 MG) BY MOUTH DAILY 90 capsule 1   icosapent Ethyl (VASCEPA) 1 g capsule TAKE 2 CAPSULES BY MOUTH 2 TIMES DAILY. 120 capsule 11   insulin lispro (HUMALOG) 100 UNIT/ML KwikPen Inject SQ QD as directed per sliding scale-  <60 call MD, 100-150 0U, 151-200 2U, 201- 250 4U, 251- 300 6U, 301- 350 8U, 351- 400 10U 401- 450 12U,  >450 15U call MD 15 mL 3   Insulin Pen Needle (B-D ULTRAFINE III SHORT PEN) 31G X 8 MM MISC INJECT AS DIRECTED 4 TIMES DAILY 300 each 2   INSULIN SYRINGE 1CC/29G 29G X 1/2" 1 ML MISC AS DIRECTED. 100 each 3   KLOR-CON M20 20 MEQ tablet TAKE 1 TABLET BY MOUTH EVERY DAY 30 tablet 3   losartan (COZAAR) 100 MG tablet Take 1 tablet (100 mg total) by mouth daily. 90 tablet 3   metoprolol succinate (TOPROL-XL) 50 MG 24 hr tablet TAKE 1 TABLET BY MOUTH EVERY DAY WITH OR IMMEDIATELY FOLLOWING A MEAL 90 tablet 1   Multiple Vitamin (MULTIVITAMIN) tablet Take 1 tablet by mouth daily.     NONFORMULARY OR COMPOUNDED ITEM [facility]:  Peripheral Neuropathy Cream - Bupivacaine 1%, Doxepin 3%, Gabapentin 6%, Pentoxifylline 3%, Topiramate 1%, apply 1-2 grams to affected ares 3-4 times a day prn. (Patient taking differently: [facility]:  Peripheral Neuropathy Cream - Bupivacaine 1%, Doxepin 3%, Gabapentin 6%, Pentoxifylline 3%, Topiramate 1%, apply 1-2  grams to affected ares 3-4 times a day prn.) 100 each 5   ONE TOUCH ULTRA TEST test strip USE FOUR TIMES DAILY BEFORE MEALS AND EVERY NIGHT AT BEDTIME 150 each 5   pantoprazole (PROTONIX) 40 MG tablet TAKE 1 TABLET BY MOUTH EVERY DAY 90 tablet 1   polyvinyl alcohol (LIQUIFILM TEARS) 1.4 % ophthalmic solution 1 drop as needed for dry eyes.     predniSONE (DELTASONE) 10 MG tablet Take 4 tablets (40  mg total) by mouth daily for 4 days. 16 tablet 0   Pyridoxine HCl (VITAMIN B-6) 100 MG tablet Take 100 mg by mouth daily.     TRULICITY 1.5 IR/6.7EL SOPN INJECT 1.5MG (1 PEN) UNDER THE SKIN ONCE A WEEK 2 mL 3   pioglitazone (ACTOS) 30 MG tablet TAKE 1 TABLET BY MOUTH EVERY DAY 90 tablet 3   rosuvastatin (CRESTOR) 20 MG tablet TAKE 1 TABLET BY MOUTH EVERY DAY (Patient not taking: Reported on 06/11/2021) 90 tablet 3   Facility-Administered Medications Prior to Visit  Medication Dose Route Frequency Provider Last Rate Last Admin   denosumab (PROLIA) injection 60 mg  60 mg Subcutaneous Q6 months [name], MD   60 mg at 11/04/20 1409       Objective:   Physical Exam:  General appearance: 87 y.o., female, NAD, conversant  Eyes: anicteric sclerae, moist conjunctivae; no lid-lag; PERRL, tracking appropriately HENT: NCAT; oropharynx, MMM, no mucosal ulcerations; normal hard and soft palate Neck: Trachea midline; no lymphadenopathy, no JVD Lungs: Crackles bilaterally, no wheeze, with normal respiratory effort CV: RRR, no MRGs  Abdomen: Soft, non-tender; non-distended, BS present  Extremities: 2+ BLE edema, warm Skin: Normal temperature, turgor and texture; no rash Psych: Appropriate affect Neuro: Alert and oriented to person and place, no focal deficit    Vitals:   06/11/21 1530  BP: 136/62  Pulse: 86  Temp: 98.2 F (36.8 C)  TempSrc: Oral  SpO2: 97%  Weight: 131 lb 12.8 oz (59.8 kg)  Height: _0  (1.626 m)   97% on RA BMI Readings from Last 3 Encounters:  06/11/21 22.62 kg/m   06/09/21 20.06 kg/m  06/02/21 20.06 kg/m   Wt Readings from Last 3 Encounters:  06/11/21 131 lb 12.8 oz (59.8 kg)  06/09/21 130 lb (59 kg)  06/02/21 130 lb (59 kg)     CBC    Component Value Date/Time   WBC 6.9 06/10/2021 1602   RBC 3.66 (L) 06/10/2021 1602   HGB 10.2 (L) 06/10/2021 1602   HCT 32.2 (L) 06/10/2021 1602   PLT 291 06/10/2021 1602   MCV 88.0 06/10/2021 1602   MCH 27.9 06/10/2021 1602   MCHC 31.7 06/10/2021 1602   RDW 12.6 06/10/2021 1602   LYMPHSABS 1.5 06/10/2021 1602   MONOABS 0.9 06/10/2021 1602   EOSABS 0.2 06/10/2021 1602   BASOSABS 0.1 06/10/2021 1602    Low level eosinophilia historically  Chest Imaging:  CT Chest 06/10/21 with anterior mediastinal mass, mediastinal LAD, subpleural reticulation/ggo/scar, traction bronchiectasis  Pulmonary Functions Testing Results: No flowsheet data found.   Echocardiogram:   TTE 09/11/20  1. Left ventricular ejection fraction, by estimation, is 65 to 70%. Left  ventricular ejection fraction by 3D volume is 68 %. The left ventricle has  normal function. The left ventricle has no regional wall motion  abnormalities. There is moderate left  ventricular hypertrophy of the basal-septal segment. Left ventricular  diastolic parameters are indeterminate. Elevated left ventricular  end-diastolic pressure. The average left ventricular global longitudinal  strain is -22.1 %. The global longitudinal  strain is normal.   2. Right ventricular systolic function is normal. The right ventricular  size is normal.   3. The mitral valve is normal in structure. Mild mitral valve  regurgitation. No evidence of mitral stenosis.   4. The aortic valve is calcified. There is severe calcifcation of the  aortic valve. There is severe thickening of the aortic valve. Aortic valve  regurgitation is trivial. Mild aortic  valve stenosis. Planimetered AVA was  calculatd at 2.16cm2. Aortic  valve mean gradient measures 8.0 mmHg. Aortic  valve Vmax measures 1.93  m/s.   5. The inferior vena cava is normal in size with greater than 50%  respiratory variability, suggesting right atrial pressure of 3 mmHg.      Assessment & Plan:   # Anterior mediastinal mass # Mediastinal lymphadenopathy Mediastinal mass may represent cyst vs thymoma vs lymphoma (especially in context of prominent mediastinal lymph nodes).   # ILD: I would say CT is indeterminate to probable for UIP. There's mediastinal LAD and enough ggo to make me wonder about superimposed or alternative process. If there is IPF this could potentially represent flare but I'd expect her to need more oxygen, there could be superimposed atypical infection and/or volume overload, or aspiration. Could also think about sarcoidosis or other CTD-ILD.   Plan: - AFB sputum, sputum culture - RF, CCP, ANA, CK - PET/CT - will see if we should consider IR biopsy of anterior mediastinal mass vs EBUS/bronch/BAL/TBLB - albuterol 1-2 puffs BID - flutter valve 10 slow but firm puffs twice daily - finish ABX, steroids - would give consideration to long empiric steroid course for ILD if workup unrevealing and still dealing with cough/dyspnea. First waiting to see if we'll need to entertain bronch anyway pending results of PET/CT. She will need to hold her plavix for 5d beforehand.       Maryjane Hurter, MD [facility] 06/15/2021 3:55 PM

## 2021-06-12 ENCOUNTER — Other Ambulatory Visit: Payer: BC Managed Care – PPO

## 2021-06-12 DIAGNOSIS — J849 Interstitial pulmonary disease, unspecified: Secondary | ICD-10-CM

## 2021-06-12 LAB — TSH: TSH: 0.34 u[IU]/mL — ABNORMAL LOW (ref 0.35–5.50)

## 2021-06-12 LAB — CK: Total CK: 49 U/L (ref 7–177)

## 2021-06-15 LAB — CULTURE, BLOOD (ROUTINE X 2)
Culture: NO GROWTH
Culture: NO GROWTH
Special Requests: ADEQUATE
Special Requests: ADEQUATE

## 2021-06-15 NOTE — Addendum Note (Signed)
Addended by: Maryjane Hurter on: 06/15/2021 03:56 PM   Modules accepted: Orders

## 2021-06-16 ENCOUNTER — Ambulatory Visit (INDEPENDENT_AMBULATORY_CARE_PROVIDER_SITE_OTHER): Payer: Medicare Other | Admitting: Podiatry

## 2021-06-16 ENCOUNTER — Other Ambulatory Visit: Payer: Self-pay

## 2021-06-16 ENCOUNTER — Encounter: Payer: Self-pay | Admitting: Podiatry

## 2021-06-16 DIAGNOSIS — B351 Tinea unguium: Secondary | ICD-10-CM | POA: Diagnosis not present

## 2021-06-16 DIAGNOSIS — E1142 Type 2 diabetes mellitus with diabetic polyneuropathy: Secondary | ICD-10-CM

## 2021-06-16 DIAGNOSIS — M79674 Pain in right toe(s): Secondary | ICD-10-CM | POA: Diagnosis not present

## 2021-06-16 DIAGNOSIS — M79675 Pain in left toe(s): Secondary | ICD-10-CM

## 2021-06-16 DIAGNOSIS — L84 Corns and callosities: Secondary | ICD-10-CM | POA: Diagnosis not present

## 2021-06-16 LAB — RHEUMATOID FACTOR: Rheumatoid fact SerPl-aCnc: <14 IU/mL (ref <14)

## 2021-06-16 LAB — ANTI-NUCLEAR AB-TITER (ANA TITER)
ANA TITER: 1:80 {titer} — ABNORMAL HIGH
ANA Titer 1: 1:80 {titer} — ABNORMAL HIGH

## 2021-06-16 LAB — CYCLIC CITRUL PEPTIDE ANTIBODY, IGG: Cyclic Citrullin Peptide Ab: <16 UNITS

## 2021-06-16 LAB — ANA: Anti Nuclear Antibody (ANA): POSITIVE — AB

## 2021-06-16 NOTE — Telephone Encounter (Signed)
Pt's son came in wanting pcp to fill out ppw for a DNR for this pt. Pt's son asked if it is possible for them to get 3 copies of this. Pt's son also provided a copy of pt Medical Power of Attorney. Copies of ppw sent to scan. Pt's son asks that when the [facility] are ready to be picked up please call.  Cb#: [phone]

## 2021-06-16 NOTE — Telephone Encounter (Signed)
Please advise if ok to complete DNR forms or does pt need OV to discuss? Thanks!

## 2021-06-16 NOTE — Telephone Encounter (Signed)
DNR forms completed and signed.  Son, [name], aware and will stop by office to pick up.

## 2021-06-20 NOTE — Progress Notes (Signed)
Subjective: Rebecca Perkins is a 87 y.o. female patient seen today for at risk foot care. She has h/o diabetic neuropathy. She presents with her family for follow up of  painful thick toenails that are difficult to trim. Pain interferes with ambulation. Aggravating factors include wearing enclosed shoe gear. Pain is relieved with periodic professional debridement.  New problems reported today: None.  Patient states their blood glucose was 194 mg/dl this morning.   Patient's daughter states Ms. Boal will be having PET scan done soon.  PCP is [name], MD. Last visit was: 06/09/2021.  Allergies  Allergen Reactions   Lipitor [Atorvastatin Calcium] Diarrhea   Niacin And Related Hives   Statins Diarrhea   Macrobid [Nitrofurantoin Macrocrystal] Other (See Comments)    unknown    Objective: Physical Exam  General: Patient is a pleasant 87 y.o. Caucasian female in NAD. AAO x 3.   Neurovascular Examination: CFT <3 seconds b/l LE. Palpable DP/PT pulses b/l LE. Digital hair absent b/l. Skin temperature gradient WNL b/l. No pain with calf compression b/l. No edema noted b/l. No cyanosis or clubbing noted b/l LE.  Protective sensation diminished with 10g monofilament b/l.  Dermatological:  Pedal integument with normal turgor, texture and tone b/l LE. No open wounds b/l. No interdigital macerations b/l. Toenails 1-5 left, R hallux, R 3rd toe, R 4th toe, and R 5th toe elongated, thickened, discolored with subungual debris. +Tenderness with dorsal palpation of nailplates. No hyperkeratotic or porokeratotic lesions present. Anonychia noted R 2nd toe. Nailbed(s) epithelialized.  Hyperkeratotic lesion(s) R 2nd toe.  No erythema, no edema, no drainage, no fluctuance.  Musculoskeletal:  Normal muscle strength 5/5 to all lower extremity muscle groups bilaterally. Clawtoe deformity R 2nd toe.  Assessment: 1. Pain due to onychomycosis of toenails of both feet   2. Corns   3. Diabetic  peripheral neuropathy associated with type 2 diabetes mellitus ([place])    Plan: Patient was evaluated and treated and all questions answered. Consent given for treatment as described below: -Examined patient. -Continue diabetic foot care principles: inspect feet daily, monitor glucose as recommended by PCP and/or Endocrinologist, and follow prescribed diet per PCP, Endocrinologist and/or dietician. -Mycotic toenails 1-5 left, R hallux, R 3rd toe, R 4th toe, and R 5th toe were debrided in length and girth with sterile nail nippers and dremel without iatrogenic bleeding. -Corn(s) R 2nd toe pared utilizing sterile scalpel blade without complication or incident. Total number debrided=1. Continue digital toe cap daily. -Patient/POA to call should there be question/concern in the interim.  Return in about 3 months (around 09/16/2021).  Marzetta Board, DPM

## 2021-06-22 ENCOUNTER — Ambulatory Visit (HOSPITAL_COMMUNITY)
Admission: RE | Admit: 2021-06-22 | Discharge: 2021-06-22 | Disposition: A | Discharge status: Home / Self Care | Payer: Medicare Other | Source: Ambulatory Visit | Attending: Student | Admitting: Student

## 2021-06-22 ENCOUNTER — Other Ambulatory Visit: Payer: Self-pay

## 2021-06-22 DIAGNOSIS — J9859 Other diseases of mediastinum, not elsewhere classified: Secondary | ICD-10-CM | POA: Diagnosis not present

## 2021-06-22 LAB — GLUCOSE, CAPILLARY: Glucose-Capillary: 182 mg/dL — ABNORMAL HIGH (ref 70–99)

## 2021-06-22 MED ORDER — FLUDEOXYGLUCOSE F - 18 (FDG) INJECTION
6.9000 | Freq: Once | INTRAVENOUS | Status: AC
  Administered 2021-06-22: 6.5 via INTRAVENOUS

## 2021-06-28 ENCOUNTER — Other Ambulatory Visit: Payer: Self-pay | Admitting: Family Medicine

## 2021-06-30 ENCOUNTER — Other Ambulatory Visit: Payer: Self-pay | Admitting: Family Medicine

## 2021-06-30 ENCOUNTER — Other Ambulatory Visit: Payer: BC Managed Care – PPO

## 2021-07-05 DIAGNOSIS — E11649 Type 2 diabetes mellitus with hypoglycemia without coma: Secondary | ICD-10-CM | POA: Diagnosis not present

## 2021-07-05 DIAGNOSIS — E1165 Type 2 diabetes mellitus with hyperglycemia: Secondary | ICD-10-CM | POA: Diagnosis not present

## 2021-07-10 ENCOUNTER — Other Ambulatory Visit: Payer: Self-pay

## 2021-07-10 MED ORDER — HYDROCHLOROTHIAZIDE 12.5 MG PO CAPS: ORAL_CAPSULE | ORAL | 1 refills | Status: AC

## 2021-07-23 ENCOUNTER — Other Ambulatory Visit: Payer: Self-pay | Admitting: Family Medicine

## 2021-07-23 NOTE — Progress Notes (Signed)
[facility] Note  07/29/2021     CHIEF COMPLAINT Patient presents for Retina Follow Up    HISTORY OF PRESENT ILLNESS: Rebecca Perkins is a 87 y.o. female who presents to the clinic today for:   HPI     Retina Follow Up   Patient presents with  Diabetic Retinopathy.  In right eye.  Duration of 6 months.  Since onset it is stable.  I, the attending physician,  performed the HPI with the patient and updated documentation appropriately.        Comments   6 month follow up NPDR OD- Doing well, no new problems.  Vision appears stable.  BS 159 A1C unsure      Last edited by Bernarda Caffey, MD on 07/29/2021  5:21 PM.      Referring physician: [name], MD [address] [place],  [place] [address]  HISTORICAL INFORMATION:   Selected notes from the MEDICAL RECORD NUMBER Referred by Dr. Luberta Mutter for concern of DME   CURRENT MEDICATIONS: Current Outpatient Medications (Ophthalmic Drugs)  Medication Sig   polyvinyl alcohol (LIQUIFILM TEARS) 1.4 % ophthalmic solution 1 drop as needed for dry eyes.   No current facility-administered medications for this visit. (Ophthalmic Drugs)   Current Outpatient Medications (Other)  Medication Sig   albuterol (VENTOLIN HFA) 108 (90 Base) MCG/ACT inhaler Inhale 1-2 puffs into the lungs in the morning and at bedtime.   Calcium Carbonate-Vitamin D (CALTRATE 600+D PO) Take by mouth 2 (two) times daily.   clopidogrel (PLAVIX) 75 MG tablet TAKE 1 TABLET BY MOUTH EVERY DAY   Coenzyme Q10 (COQ10 PO) Take by mouth.   denosumab (PROLIA) 60 MG/ML SOSY injection Inject into the skin every 6 (six) months.    donepezil (ARICEPT) 10 MG tablet TAKE 1 TABLET BY MOUTH EVERYDAY AT BEDTIME   fenofibrate 160 MG tablet TAKE 1 TABLET BY MOUTH EVERY DAY   fluticasone (FLONASE) 50 MCG/ACT nasal spray SPRAY 2 SPRAYS INTO EACH NOSTRIL EVERY DAY   hydrochlorothiazide (MICROZIDE) 12.5 MG capsule Take 1 capsule  (12.$RemoveBefo'5MG'BoyuHFlibjP$ ) by mouth daily.   icosapent Ethyl (VASCEPA) 1 g capsule TAKE 2 CAPSULES BY MOUTH 2 TIMES DAILY.   insulin lispro (HUMALOG) 100 UNIT/ML KwikPen Inject SQ QD as directed per sliding scale-  <60 call MD, 100-150 0U, 151-200 2U, 201- 250 4U, 251- 300 6U, 301- 350 8U, 351- 400 10U 401- 450 12U,  >450 15U call MD   KLOR-CON M20 20 MEQ tablet TAKE 1 TABLET BY MOUTH EVERY DAY   losartan (COZAAR) 100 MG tablet Take 1 tablet (100 mg total) by mouth daily.   metoprolol succinate (TOPROL-XL) 50 MG 24 hr tablet TAKE 1 TABLET BY MOUTH EVERY DAY WITH OR IMMEDIATELY FOLLOWING A MEAL   Multiple Vitamin (MULTIVITAMIN) tablet Take 1 tablet by mouth daily.   pantoprazole (PROTONIX) 40 MG tablet TAKE 1 TABLET BY MOUTH EVERY DAY   pioglitazone (ACTOS) 30 MG tablet TAKE 1 TABLET BY MOUTH EVERY DAY   Pyridoxine HCl (VITAMIN B-6) 100 MG tablet Take 100 mg by mouth daily.   TRULICITY 1.5 ZD/6.6YQ SOPN INJECT 1.$RemoveBefor'5MG'dOlctchtUqlF$  (1 PEN) UNDER THE SKIN ONCE A WEEK   amoxicillin-clavulanate (AUGMENTIN) 875-125 MG tablet Take 1 tablet by mouth 2 (two) times daily. (Patient not taking: Reported on 07/29/2021)   benzonatate (TESSALON) 100 MG capsule Take 1 capsule (100 mg total) by mouth every 8 (eight) hours. (Patient not taking: Reported on 07/29/2021)  Blood Glucose Monitoring Suppl (ONE TOUCH ULTRA SYSTEM KIT) W/DEVICE KIT 1 kit by Does not apply route once. Tests Blood sugar before meals and at bedtime   Continuous Blood Gluc Sensor (FREESTYLE LIBRE 14 DAY SENSOR) MISC USE EVERY 14 DAYS   Insulin Pen Needle (B-D ULTRAFINE III SHORT PEN) 31G X 8 MM MISC INJECT AS DIRECTED 4 TIMES DAILY   INSULIN SYRINGE 1CC/29G 29G X 1/2" 1 ML MISC AS DIRECTED.   NONFORMULARY OR COMPOUNDED ITEM [facility]:  Peripheral Neuropathy Cream - Bupivacaine 1%, Doxepin 3%, Gabapentin 6%, Pentoxifylline 3%, Topiramate 1%, apply 1-2 grams to affected ares 3-4 times a day prn. (Patient taking differently: [facility]:  Peripheral Neuropathy  Cream - Bupivacaine 1%, Doxepin 3%, Gabapentin 6%, Pentoxifylline 3%, Topiramate 1%, apply 1-2 grams to affected ares 3-4 times a day prn.)   ONE TOUCH ULTRA TEST test strip USE FOUR TIMES DAILY BEFORE MEALS AND EVERY NIGHT AT BEDTIME   Current Facility-Administered Medications (Other)  Medication Route   denosumab (PROLIA) injection 60 mg Subcutaneous   REVIEW OF SYSTEMS: ROS   Positive for: Gastrointestinal, Neurological, Genitourinary, Musculoskeletal, HENT, Endocrine, Eyes Negative for: Constitutional, Skin, Cardiovascular, Respiratory, Psychiatric, Allergic/Imm, Heme/Lymph Last edited by Joni Reining, COA on 07/29/2021  1:07 PM.     ALLERGIES Allergies  Allergen Reactions   Lipitor [Atorvastatin Calcium] Diarrhea   Niacin And Related Hives   Statins Diarrhea   Macrobid [Nitrofurantoin Macrocrystal] Other (See Comments)    unknown   PAST MEDICAL HISTORY Past Medical History:  Diagnosis Date   Anemia    Diabetes mellitus type 2, insulin dependent (HCC)    Diabetic retinopathy (HCC)    NPDR OU   Dyslipidemia    GERD (gastroesophageal reflux disease)    Hypertension    Hypertensive retinopathy    OU   Migraines    Osteoporosis    Pneumonia    PVD (peripheral vascular disease) (HCC)    abi .83 (L), .92 (R)   RSD (reflex sympathetic dystrophy) 2007   R wrist/hand following fx   Stroke (HCC)    TIA (transient ischemic attack)    Varicose veins    Past Surgical History:  Procedure Laterality Date   ABDOMINAL HYSTERECTOMY  1988   APPENDECTOMY  1966   BREAST CYST EXCISION     CATARACT EXTRACTION Bilateral 10/2009   CHOLECYSTECTOMY  1989   EYE SURGERY Bilateral    Cat Sx OU   Several benign cyst removed     last 1 in 1972   TUBAL LIGATION     UMBILICAL HERNIA REPAIR     FAMILY HISTORY Family History  Problem Relation Age of Onset   Stroke Mother 44   Hypertension Mother    Clotting disorder Father    Heart attack Father    Arrhythmia Sister    Stroke  Brother    Thyroid disease Neg Hx    SOCIAL HISTORY Social History   Tobacco Use   Smoking status: Never   Smokeless tobacco: Never  Substance Use Topics   Alcohol use: No   Drug use: No       OPHTHALMIC EXAM:  Base Eye Exam     Visual Acuity (Snellen - Linear)       Right Left   Dist [place] 20/50 20/25 -2   Dist ph [place] 20/30 -2 NI         Tonometry (Tonopen, 1:16 PM)       Right Left   Pressure 12 12  Pupils       Dark Light Shape React APD   Right 2 1 Round Minimal None   Left 2 1 Round Minimal None         Visual Fields (Counting fingers)       Left Right    Full Full         Extraocular Movement       Right Left    Full Full         Neuro/Psych     Oriented x3: Yes   Mood/Affect: Normal         Dilation     Both eyes: 1.0% Mydriacyl, 2.5% Phenylephrine @ 1:17 PM           Slit Lamp and Fundus Exam     Slit Lamp Exam       Right Left   Lids/Lashes Meibomian gland dysfunction, Dermatochalasis - upper lid, Ptosis Meibomian gland dysfunction, Dermatochalasis - upper lid, Ptosis   Conjunctiva/Sclera White and quiet White and quiet   Cornea 1+ inferior Punctate epithelial erosions, Arcus, decreased TBUT, irregular epi 1-2+inferior Punctate epithelial erosions, Arcus, tear film debris   Anterior Chamber Deep and quiet Deep and quiet   Iris Round and dilated, No NVI Round and dilated, No NVI   Lens PCIOL in good position with open PC PCIOL in good position with open PC   Anterior Vitreous mild Vitreous syneresis mild Vitreous syneresis         Fundus Exam       Right Left   Disc Pink and Sharp trace Pallor, Sharp rim   C/D Ratio 0.4 0.5   Macula Flat, Blunted foveal reflex, scattered Microaneurysms/IRH, cluster of IRH and exudate just inside ST arcades, light focal laser scars ST macula Flat, blunted foveal reflex, scattered MA, no edema   Vessels attenuated, Tortuous attenuated, Tortuous   Periphery Attached,  scattered MA/DBH greatest posteriorly, flat, pigmented lesion superior to disc Attached, scattered DBH greatest posteriorly           Refraction     Manifest Refraction       Sphere Cylinder Axis Dist VA   Right -1.25 +0.75 160 20/30-2   Left               IMAGING AND PROCEDURES  Imaging and Procedures for $RemoveBefore'@TODAY'TfMBgiTavoJam$ @  OCT, Retina - OU - Both Eyes       Right Eye Quality was borderline. Central Foveal Thickness: 266. Progression has worsened. Findings include normal foveal contour, intraretinal hyper-reflective material, no IRF, intraretinal fluid (Mild interval increase in IRF/IRHM temporal and SN macula).   Left Eye Quality was good. Central Foveal Thickness: 261. Progression has been stable. Findings include normal foveal contour, no IRF, no SRF.   Notes *Images captured and stored on drive  Diagnosis / Impression:  OD: Mild interval increase in IRF/IRHM temporal and SN macula OS: no DME  Clinical management:  See below  Abbreviations: NFP - Normal foveal profile. CME - cystoid macular edema. PED - pigment epithelial detachment. IRF - intraretinal fluid. SRF - subretinal fluid. EZ - ellipsoid zone. ERM - epiretinal membrane. ORA - outer retinal atrophy. ORT - outer retinal tubulation. SRHM - subretinal hyper-reflective material            ASSESSMENT/PLAN:    ICD-10-CM   1. Moderate nonproliferative diabetic retinopathy of right eye with macular edema associated with type 2 diabetes mellitus (HCC)  E11.3311 OCT, Retina - OU - Both Eyes  2. Essential hypertension  I10     3. Hypertensive retinopathy of both eyes  H35.033     4. Pseudophakia of both eyes  Z96.1     5. Dry eyes  H04.123      1. Moderate nonproliferative diabetic retinopathy w/ DME, OD  - OS without DME  - A1c was 6.9 on 08.26.22  - FA (07.14.20 and 7.19.21) shows late leaking MA OU; OD with focal hyperfluorescent leakage in area of DME  - s/p focal laser OD (11.09.20)  - OCT OD: Mild  interval increase in IRF/IRHM temporal and SN macula  - BCVA  20/30 OD; 20/25 OS  - no retinal intervention indicated or recommended at this time  - monitor for now  - dec f/u to 3 months, DFE, OCT  2,3. Hypertensive retinopathy OU  - discussed importance of tight BP control  - monitor  4. Pseudophakia OU  - s/p CE/IOL OU (Dr. Albina Billet)  - beautiful surgeries, doing well  - monitor  5. Dry Eyes OU (OD > OS)  - recommend artificial tears and lubricating ointment as needed  Ophthalmic Meds Ordered this visit:  No orders of the defined types were placed in this encounter.    Return in about 3 months (around 10/27/2021) for f/u NPDR OD, DFE, OCT.  There are no Patient Instructions on file for this visit.  This document serves as a record of services personally performed by Gardiner Sleeper, MD, PhD. It was created on their behalf by Orvan Falconer, an ophthalmic technician. The creation of this record is the provider's dictation and/or activities during the visit.    Electronically signed by: Orvan Falconer, OA, 07/29/21  5:23 PM  This document serves as a record of services personally performed by Gardiner Sleeper, MD, PhD. It was created on their behalf by San Jetty. Owens Shark, OA an ophthalmic technician. The creation of this record is the provider's dictation and/or activities during the visit.    Electronically signed by: San Jetty. Owens Shark, [place] 12.28.2022 5:23 PM  Gardiner Sleeper, M.D., Ph.D. Diseases & Surgery of the Retina and Vitreous [facility]  I have reviewed the above documentation for accuracy and completeness, and I agree with the above. Gardiner Sleeper, M.D., Ph.D. 07/29/21 5:23 PM   Abbreviations: M myopia (nearsighted); A astigmatism; H hyperopia (farsighted); P presbyopia; Mrx spectacle prescription;  CTL contact lenses; OD right eye; OS left eye; OU both eyes  XT exotropia; ET esotropia; PEK punctate epithelial keratitis; PEE punctate  epithelial erosions; DES dry eye syndrome; MGD meibomian gland dysfunction; ATs artificial tears; PFAT's preservative free artificial tears; [place] nuclear sclerotic cataract; PSC posterior subcapsular cataract; ERM epi-retinal membrane; PVD posterior vitreous detachment; RD retinal detachment; DM diabetes mellitus; DR diabetic retinopathy; NPDR non-proliferative diabetic retinopathy; PDR proliferative diabetic retinopathy; CSME clinically significant macular edema; DME diabetic macular edema; dbh dot blot hemorrhages; CWS cotton wool spot; POAG primary open angle glaucoma; C/D cup-to-disc ratio; HVF humphrey visual field; GVF goldmann visual field; OCT optical coherence tomography; IOP intraocular pressure; BRVO Branch retinal vein occlusion; CRVO central retinal vein occlusion; CRAO central retinal artery occlusion; BRAO branch retinal artery occlusion; RT retinal tear; SB scleral buckle; PPV pars plana vitrectomy; VH Vitreous hemorrhage; PRP panretinal laser photocoagulation; IVK intravitreal kenalog; VMT vitreomacular traction; MH Macular hole;  NVD neovascularization of the disc; NVE neovascularization elsewhere; AREDS age related eye disease study; ARMD age related macular degeneration; POAG primary open angle glaucoma; EBMD epithelial/anterior basement  membrane dystrophy; ACIOL anterior chamber intraocular lens; IOL intraocular lens; PCIOL posterior chamber intraocular lens; Phaco/IOL phacoemulsification with intraocular lens placement; [place] photorefractive keratectomy; LASIK laser assisted in situ keratomileusis; HTN hypertension; DM diabetes mellitus; COPD chronic obstructive pulmonary disease

## 2021-07-29 ENCOUNTER — Ambulatory Visit (INDEPENDENT_AMBULATORY_CARE_PROVIDER_SITE_OTHER): Payer: Medicare Other | Admitting: Ophthalmology

## 2021-07-29 ENCOUNTER — Encounter (INDEPENDENT_AMBULATORY_CARE_PROVIDER_SITE_OTHER): Payer: Self-pay | Admitting: Ophthalmology

## 2021-07-29 ENCOUNTER — Other Ambulatory Visit: Payer: Self-pay

## 2021-07-29 DIAGNOSIS — H35033 Hypertensive retinopathy, bilateral: Secondary | ICD-10-CM

## 2021-07-29 DIAGNOSIS — H04123 Dry eye syndrome of bilateral lacrimal glands: Secondary | ICD-10-CM | POA: Diagnosis not present

## 2021-07-29 DIAGNOSIS — Z961 Presence of intraocular lens: Secondary | ICD-10-CM

## 2021-07-29 DIAGNOSIS — I10 Essential (primary) hypertension: Secondary | ICD-10-CM | POA: Diagnosis not present

## 2021-07-29 DIAGNOSIS — E113311 Type 2 diabetes mellitus with moderate nonproliferative diabetic retinopathy with macular edema, right eye: Secondary | ICD-10-CM

## 2021-07-29 NOTE — Progress Notes (Signed)
Synopsis: Referred for pulmonary fibrosis, anterior mediastinal mass by [name], MD  Subjective:   PATIENT ID: Rebecca Perkins GENDER: female DOB: [birth year], MRN: [id number]  Chief Complaint  Patient presents with   Follow-up    Overall doing well and no new co's. Discuss PET done 06/29/21.    87yF with history of DM2, GERD, stroke, never smoker who is referred for pulmonary fibrosis, mediastinal mass.  She says she's feeling overall pretty well except for cough, some dyspnea. Has had cough, dyspnea for months, especially over last 2-3 weeks. Sometimes productive. Yellow. No hemoptysis, fever, weight loss, night sweats, CP. No change in vision, double vision. She hasn't noticed marked onset of weakness with standing or reaching overhead. Went to ED yesterday and was diagnosed with pneumonia, given steroids, ABX, and referred to our clinic with concerns about pulmonary fibrosis and mediastinal mass.   She does intermittently have solid/liquid food dysphagia but no rashes, oral/nasal ulcers, arthritis with morning stiffness.   No family history of lung disease  Never smoker  Interval HPI: PET/CT obtained which showed anterior mediastinal mass likely benign cystic lesion. No hypermetabolic LAD or other lesion. She says she is feeling fine. Gets around well with cane. She has no cough, chest pain, fever. She doesn't note DOE relative to peers without lung disease.   Otherwise pertinent review of systems is negative.    Past Medical History:  Diagnosis Date   Anemia    Diabetes mellitus type 2, insulin dependent (HCC)    Diabetic retinopathy (HCC)    NPDR OU   Dyslipidemia    GERD (gastroesophageal reflux disease)    Hypertension    Hypertensive retinopathy    OU   Migraines    Osteoporosis    Pneumonia    PVD (peripheral vascular disease) ([place])    abi .83 (L), .92 (R)   RSD (reflex sympathetic dystrophy) 2007   R wrist/hand following fx   Stroke ([place])    TIA  (transient ischemic attack)    Varicose veins      Family History  Problem Relation Age of Onset   Stroke Mother 49   Hypertension Mother    Clotting disorder Father    Heart attack Father    Arrhythmia Sister    Stroke Brother    Thyroid disease Neg Hx      Past Surgical History:  Procedure Laterality Date   ABDOMINAL HYSTERECTOMY  1988   APPENDECTOMY  1966   BREAST CYST EXCISION     CATARACT EXTRACTION Bilateral 10/2009   CHOLECYSTECTOMY  1989   EYE SURGERY Bilateral    Cat Sx OU   Several benign cyst removed     last 1 in 1972   TUBAL LIGATION     UMBILICAL HERNIA REPAIR      Social History   Socioeconomic History   Marital status: Widowed    Spouse name: Not on file   Number of children: 3   Years of education: Not on file   Highest education level: Not on file  Occupational History   Occupation: Retired  Tobacco Use   Smoking status: Never   Smokeless tobacco: Never  Vaping Use   Vaping Use: Not on file  Substance and Sexual Activity   Alcohol use: No   Drug use: No   Sexual activity: Not Currently  Other Topics Concern   Not on file  Social History Narrative   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   Married , lives with spouse of 25 y (03/2011)   Social Determinants of Health   Financial Resource Strain: Not on file  Food Insecurity: Not on file  Transportation Needs: Not on file  Physical Activity: Not on file  Stress: Not on file  Social Connections: Not on file  Intimate Partner Violence: Not on file     Allergies  Allergen Reactions   Lipitor [Atorvastatin Calcium] Diarrhea   Niacin And Related Hives   Statins Diarrhea   Macrobid [Nitrofurantoin Macrocrystal] Other (See Comments)    unknown     Outpatient Medications Prior to Visit  Medication Sig Dispense Refill   albuterol (VENTOLIN HFA) 108 (90 Base) MCG/ACT inhaler Inhale 1-2 puffs into the lungs in the morning and at bedtime. 8 g 11    Blood Glucose Monitoring Suppl (ONE TOUCH ULTRA SYSTEM KIT) W/DEVICE KIT 1 kit by Does not apply route once. Tests Blood sugar before meals and at bedtime 1 each 0   Calcium Carbonate-Vitamin D (CALTRATE 600+D PO) Take by mouth 2 (two) times daily.     clopidogrel (PLAVIX) 75 MG tablet TAKE 1 TABLET BY MOUTH EVERY DAY 90 tablet 3   Coenzyme Q10 (COQ10 PO) Take by mouth.     Continuous Blood Gluc Sensor (FREESTYLE LIBRE 14 DAY SENSOR) MISC USE EVERY 14 DAYS 6 each 3   denosumab (PROLIA) 60 MG/ML SOSY injection Inject into the skin every 6 (six) months.      donepezil (ARICEPT) 10 MG tablet TAKE 1 TABLET BY MOUTH EVERYDAY AT BEDTIME 90 tablet 3   fenofibrate 160 MG tablet TAKE 1 TABLET BY MOUTH EVERY DAY 90 tablet 3   fluticasone (FLONASE) 50 MCG/ACT nasal spray SPRAY 2 SPRAYS INTO EACH NOSTRIL EVERY DAY 48 mL 2   hydrochlorothiazide (MICROZIDE) 12.5 MG capsule Take 1 capsule (12.$RemoveBefor'5MG'OcZHWQeygvHN$ ) by mouth daily. 90 capsule 1   icosapent Ethyl (VASCEPA) 1 g capsule TAKE 2 CAPSULES BY MOUTH 2 TIMES DAILY. 120 capsule 11   insulin lispro (HUMALOG) 100 UNIT/ML KwikPen Inject SQ QD as directed per sliding scale-  <60 call MD, 100-150 0U, 151-200 2U, 201- 250 4U, 251- 300 6U, 301- 350 8U, 351- 400 10U 401- 450 12U,  >450 15U call MD 15 mL 3   Insulin Pen Needle (B-D ULTRAFINE III SHORT PEN) 31G X 8 MM MISC INJECT AS DIRECTED 4 TIMES DAILY 300 each 2   INSULIN SYRINGE 1CC/29G 29G X 1/2" 1 ML MISC AS DIRECTED. 100 each 3   KLOR-CON M20 20 MEQ tablet TAKE 1 TABLET BY MOUTH EVERY DAY 90 tablet 2   losartan (COZAAR) 100 MG tablet Take 1 tablet (100 mg total) by mouth daily. 90 tablet 3   metoprolol succinate (TOPROL-XL) 50 MG 24 hr tablet TAKE 1 TABLET BY MOUTH EVERY DAY WITH OR IMMEDIATELY FOLLOWING A MEAL 90 tablet 1   Multiple Vitamin (MULTIVITAMIN) tablet Take 1 tablet by mouth daily.     NONFORMULARY OR COMPOUNDED ITEM [facility]:  Peripheral Neuropathy Cream - Bupivacaine 1%, Doxepin 3%, Gabapentin 6%,  Pentoxifylline 3%, Topiramate 1%, apply 1-2 grams to affected ares 3-4 times a day prn. (Patient taking differently: [facility]:  Peripheral Neuropathy Cream - Bupivacaine 1%, Doxepin 3%, Gabapentin 6%, Pentoxifylline 3%, Topiramate 1%, apply 1-2 grams to affected ares 3-4 times a day prn.) 100 each 5   ONE TOUCH ULTRA TEST test strip USE FOUR TIMES DAILY BEFORE MEALS AND EVERY NIGHT AT BEDTIME 150 each 5   pantoprazole (PROTONIX)  40 MG tablet TAKE 1 TABLET BY MOUTH EVERY DAY 90 tablet 1   pioglitazone (ACTOS) 30 MG tablet TAKE 1 TABLET BY MOUTH EVERY DAY 90 tablet 3   polyvinyl alcohol (LIQUIFILM TEARS) 1.4 % ophthalmic solution 1 drop as needed for dry eyes.     Pyridoxine HCl (VITAMIN B-6) 100 MG tablet Take 100 mg by mouth daily.     TRULICITY 1.5 TZ/0.0FV SOPN INJECT 1.$RemoveBefor'5MG'FtRPuazQAMyd$  (1 PEN) UNDER THE SKIN ONCE A WEEK 2 mL 3   amoxicillin-clavulanate (AUGMENTIN) 875-125 MG tablet Take 1 tablet by mouth 2 (two) times daily. 20 tablet 0   benzonatate (TESSALON) 100 MG capsule Take 1 capsule (100 mg total) by mouth every 8 (eight) hours. 21 capsule 0   Facility-Administered Medications Prior to Visit  Medication Dose Route Frequency Provider Last Rate Last Admin   denosumab (PROLIA) injection 60 mg  60 mg Subcutaneous Q6 months [name], MD   60 mg at 11/04/20 1409       Objective:   Physical Exam:  General appearance: 87 y.o., female, NAD, conversant  Eyes: anicteric sclerae, moist conjunctivae; no lid-lag; PERRL, tracking appropriately HENT: NCAT; oropharynx, MMM, no mucosal ulcerations; normal hard and soft palate Neck: Trachea midline; no lymphadenopathy, no JVD Lungs: Crackles bilaterally, no wheeze, with normal respiratory effort CV: RRR, no MRGs  Abdomen: Soft, non-tender; non-distended, BS present  Extremities: 2+ BLE edema, warm Skin: Normal temperature, turgor and texture; no rash Psych: Appropriate affect Neuro: Alert and oriented to person and place, no focal  deficit    Vitals:   07/30/21 1349  BP: 130/66  Pulse: 77  Temp: 97.7 F (36.5 C)  TempSrc: Oral  SpO2: 100%  Weight: 127 lb (57.6 kg)  Height: $Remove'5\' 4"'Nzhdxan$  (1.626 m)    100% on RA BMI Readings from Last 3 Encounters:  07/30/21 21.80 kg/m  06/22/21 23.86 kg/m  06/11/21 22.62 kg/m   Wt Readings from Last 3 Encounters:  07/30/21 127 lb (57.6 kg)  06/22/21 139 lb (63 kg)  06/11/21 131 lb 12.8 oz (59.8 kg)     CBC    Component Value Date/Time   WBC 6.9 06/10/2021 1602   RBC 3.66 (L) 06/10/2021 1602   HGB 10.2 (L) 06/10/2021 1602   HCT 32.2 (L) 06/10/2021 1602   PLT 291 06/10/2021 1602   MCV 88.0 06/10/2021 1602   MCH 27.9 06/10/2021 1602   MCHC 31.7 06/10/2021 1602   RDW 12.6 06/10/2021 1602   LYMPHSABS 1.5 06/10/2021 1602   MONOABS 0.9 06/10/2021 1602   EOSABS 0.2 06/10/2021 1602   BASOSABS 0.1 06/10/2021 1602    Low level eosinophilia historically  1:80 ANA  Chest Imaging:  CT Chest 06/10/21 with anterior mediastinal mass, mediastinal LAD, subpleural reticulation/ggo/scar, traction bronchiectasis  PET/CT 06/21/21 reviewed by me which showed anterior mediastinal mass likely benign cystic lesion. No hypermetabolic LAD or other lesion. Slightly less prominent but persistent subpleural reticulation/ggo/scar, traction bronchiectasis  Pulmonary Functions Testing Results: No flowsheet data found.   Echocardiogram:   TTE 09/11/20  1. Left ventricular ejection fraction, by estimation, is 65 to 70%. Left  ventricular ejection fraction by 3D volume is 68 %. The left ventricle has  normal function. The left ventricle has no regional wall motion  abnormalities. There is moderate left  ventricular hypertrophy of the basal-septal segment. Left ventricular  diastolic parameters are indeterminate. Elevated left ventricular  end-diastolic pressure. The average left ventricular global longitudinal  strain is -22.1 %. The global longitudinal  strain  is normal.   2. Right  ventricular systolic function is normal. The right ventricular  size is normal.   3. The mitral valve is normal in structure. Mild mitral valve  regurgitation. No evidence of mitral stenosis.   4. The aortic valve is calcified. There is severe calcifcation of the  aortic valve. There is severe thickening of the aortic valve. Aortic valve  regurgitation is trivial. Mild aortic valve stenosis. Planimetered AVA was  calculatd at 2.16cm2. Aortic  valve mean gradient measures 8.0 mmHg. Aortic valve Vmax measures 1.93  m/s.   5. The inferior vena cava is normal in size with greater than 50%  respiratory variability, suggesting right atrial pressure of 3 mmHg.      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  # ILD: CT is indeterminate to probable for UIP. There's mediastinal LAD and enough ggo to make me wonder about superimposed or alternative process. If there is IPF this could potentially represent flare but I'd expect her to need more oxygen, there could be superimposed atypical infection and/or volume overload, or aspiration. Could also think about sarcoidosis or other CTD-ILD but ANA is only 1:80 and probably insignificant. Regardless she and her son report that she is essentially asymptomatic and right now I think there's little I could offer to improve her quality of life.   Plan: - If she develops shortness of breath that limits your activities, persistent cough, fever, unintentional weight loss, chest pain then I am happy to see her again in clinic - if she comes back to check PFTs - albuterol 1-2 puffs BID - flutter valve 10 slow but firm puffs twice daily - up to date with pneumonia, flu, covid vaccination  I spent 18 minutes dedicated to the care of this patient on the date of this encounter to include pre-visit review of records, face-to-face time with the patient discussing conditions above, clinical documentation with the  electronic health record, and communicating necessary findings to members of the patients care team.     Maryjane Hurter, MD [facility] 07/30/2021 2:01 PM

## 2021-07-30 ENCOUNTER — Ambulatory Visit (INDEPENDENT_AMBULATORY_CARE_PROVIDER_SITE_OTHER): Payer: Medicare Other | Admitting: Student

## 2021-07-30 ENCOUNTER — Encounter: Payer: Self-pay | Admitting: Student

## 2021-07-30 VITALS — BP 130/66 | HR 77 | Temp 97.7°F | Ht 64.0 in | Wt 127.0 lb

## 2021-07-30 DIAGNOSIS — J9859 Other diseases of mediastinum, not elsewhere classified: Secondary | ICD-10-CM | POA: Diagnosis not present

## 2021-07-30 DIAGNOSIS — J849 Interstitial pulmonary disease, unspecified: Secondary | ICD-10-CM | POA: Diagnosis not present

## 2021-07-30 NOTE — Patient Instructions (Addendum)
-   If you develop shortness of breath that limits your activities, persistent cough, fever, unintentional weight loss, chest pain then I am happy to see you again to check breathing tests, labwork to look for causes of interstitial lung disease

## 2021-08-04 LAB — AFB CULTURE WITH SMEAR (NOT AT ARMC)
Acid Fast Culture: NEGATIVE
Acid Fast Smear: NEGATIVE

## 2021-08-21 ENCOUNTER — Telehealth: Payer: Self-pay | Admitting: Family Medicine

## 2021-08-21 ENCOUNTER — Ambulatory Visit: Payer: Medicare PPO

## 2021-08-21 ENCOUNTER — Other Ambulatory Visit: Payer: Self-pay

## 2021-08-21 DIAGNOSIS — M2041 Other hammer toe(s) (acquired), right foot: Secondary | ICD-10-CM

## 2021-08-21 DIAGNOSIS — E1142 Type 2 diabetes mellitus with diabetic polyneuropathy: Secondary | ICD-10-CM

## 2021-08-21 DIAGNOSIS — M2042 Other hammer toe(s) (acquired), left foot: Secondary | ICD-10-CM

## 2021-08-21 NOTE — Telephone Encounter (Signed)
Patient came to office to have provider sign paperwork to approve order of therapeutic shoes made by [employer] [facility] [employer] today. Provider signed forms immediately. Faxed to [phone] (fax number on paperwork) and sent out to scan with confirmation sheet. Also gave patient copy of confirmation sheet.

## 2021-08-22 NOTE — Progress Notes (Signed)
SITUATION Reason for Consult: Evaluation for Prefabricated Diabetic Shoes and Bilateral Custom Diabetic Inserts. Patient / Caregiver Report: Patient would like black shoes of the same model she already has  OBJECTIVE DATA: Patient History / Diagnosis:    ICD-10-CM   1. Diabetic peripheral neuropathy associated with type 2 diabetes mellitus (HCC)  E11.42     2. Acquired hammertoes of both feet  M20.41    M20.42       Current or Previous Devices:   Existing [employer] diabetic shoes and insoles  In-Person Foot Examination: Ulcers & Callousing:   None  Toe / Foot Deformities:   - Hammertoes   Shoe Size: 7.92M (men's)  ORTHOTIC RECOMMENDATION Recommended Devices: - 1x pair prefabricated PDAC approved diabetic shoes: [id number] - 3x pair custom-to-patient vacuum formed diabetic insoles.   GOALS OF SHOES AND INSOLES - Reduce shear and pressure - Reduce / Prevent callus formation - Reduce / Prevent ulceration - Protect the fragile healing compromised diabetic foot.  Patient would benefit from diabetic shoes and inserts as patient has diabetes mellitus and the patient has one or more of the following conditions: - Peripheral neuropathy with evidence of callus formation - Foot deformity - Poor circulation  ACTIONS PERFORMED Patient was casted for insoles via crush box and measured for shoes via brannock device. Procedure was explained and patient tolerated procedure well. All questions were answered and concerns addressed.  PLAN Patient is to ensure treating physician receives and completes diabetic paperwork. Casts and shoe order are to be held until paperwork is received. Once received patient is to be scheduled for fitting in four weeks.

## 2021-08-28 ENCOUNTER — Other Ambulatory Visit: Payer: Medicare Other

## 2021-09-10 NOTE — Telephone Encounter (Signed)
err

## 2021-09-22 ENCOUNTER — Ambulatory Visit: Payer: Medicare PPO | Admitting: Family Medicine

## 2021-09-22 ENCOUNTER — Other Ambulatory Visit: Payer: Self-pay

## 2021-09-22 ENCOUNTER — Encounter: Payer: Self-pay | Admitting: Family Medicine

## 2021-09-22 VITALS — BP 108/58 | HR 80 | Temp 97.9°F | Resp 18 | Ht 64.0 in | Wt 122.0 lb

## 2021-09-22 DIAGNOSIS — R413 Other amnesia: Secondary | ICD-10-CM | POA: Diagnosis not present

## 2021-09-22 DIAGNOSIS — F321 Major depressive disorder, single episode, moderate: Secondary | ICD-10-CM

## 2021-09-22 MED ORDER — ESCITALOPRAM OXALATE 10 MG PO TABS: 10.0000 mg | ORAL_TABLET | Freq: Every day | ORAL | 2 refills | Status: DC

## 2021-09-22 NOTE — Progress Notes (Signed)
Subjective:    Patient ID: Rebecca Perkins, female    DOB: 1929-04-14, 88 y.o.   MRN: [id number] Unfortunately patient's dementia is worsening.  Daughter states patient spends more of her time sitting and crying.  She frequently says she wishes she could die.  She prays every night and got up to take her home.  She is more angry.  She is having more mood swings.  She reports feeling depressed.  She reports feeling like she is a burden.  Today she is extremely tearful.  She is not eating well.  She is losing weight. Past Medical History:  Diagnosis Date   Anemia    Diabetes mellitus type 2, insulin dependent (HCC)    Diabetic retinopathy ([place])    NPDR OU   Dyslipidemia    GERD (gastroesophageal reflux disease)    Hypertension    Hypertensive retinopathy    OU   Migraines    Osteoporosis    Pneumonia    PVD (peripheral vascular disease) ([place])    abi .83 (L), .92 (R)   RSD (reflex sympathetic dystrophy) 2007   R wrist/hand following fx   Stroke ([place])    TIA (transient ischemic attack)    Varicose veins    Past Surgical History:  Procedure Laterality Date   [place]   BREAST CYST EXCISION     CATARACT EXTRACTION Bilateral 10/2009   CHOLECYSTECTOMY  1989   EYE SURGERY Bilateral    Cat Sx OU   Several benign cyst removed     last 1 in [place]     Current Outpatient Medications on File Prior to Visit  Medication Sig Dispense Refill   albuterol (VENTOLIN HFA) 108 (90 Base) MCG/ACT inhaler Inhale 1-2 puffs into the lungs in the morning and at bedtime. 8 g 11   Blood Glucose Monitoring Suppl (ONE TOUCH ULTRA SYSTEM KIT) W/DEVICE KIT 1 kit by Does not apply route once. Tests Blood sugar before meals and at bedtime 1 each 0   Calcium Carbonate-Vitamin D (CALTRATE 600+D PO) Take by mouth 2 (two) times daily.     clopidogrel (PLAVIX) 75 MG tablet TAKE 1 TABLET BY MOUTH EVERY DAY 90 tablet 3    Coenzyme Q10 (COQ10 PO) Take by mouth.     Continuous Blood Gluc Sensor (FREESTYLE LIBRE 14 DAY SENSOR) MISC USE EVERY 14 DAYS 6 each 3   denosumab (PROLIA) 60 MG/ML SOSY injection Inject into the skin every 6 (six) months.      donepezil (ARICEPT) 10 MG tablet TAKE 1 TABLET BY MOUTH EVERYDAY AT BEDTIME 90 tablet 3   fenofibrate 160 MG tablet TAKE 1 TABLET BY MOUTH EVERY DAY 90 tablet 3   fluticasone (FLONASE) 50 MCG/ACT nasal spray SPRAY 2 SPRAYS INTO EACH NOSTRIL EVERY DAY 48 mL 2   hydrochlorothiazide (MICROZIDE) 12.5 MG capsule Take 1 capsule (12.$RemoveBefor'5MG'cugXGarvsRuh$ ) by mouth daily. 90 capsule 1   icosapent Ethyl (VASCEPA) 1 g capsule TAKE 2 CAPSULES BY MOUTH 2 TIMES DAILY. 120 capsule 11   insulin lispro (HUMALOG) 100 UNIT/ML KwikPen Inject SQ QD as directed per sliding scale-  <60 call MD, 100-150 0U, 151-200 2U, 201- 250 4U, 251- 300 6U, 301- 350 8U, 351- 400 10U 401- 450 12U,  >450 15U call MD 15 mL 3   Insulin Pen Needle (B-D ULTRAFINE III SHORT PEN) 31G X 8 MM MISC INJECT AS DIRECTED 4  TIMES DAILY 300 each 2   INSULIN SYRINGE 1CC/29G 29G X 1/2" 1 ML MISC AS DIRECTED. 100 each 3   KLOR-CON M20 20 MEQ tablet TAKE 1 TABLET BY MOUTH EVERY DAY 90 tablet 2   losartan (COZAAR) 100 MG tablet Take 1 tablet (100 mg total) by mouth daily. 90 tablet 3   metoprolol succinate (TOPROL-XL) 50 MG 24 hr tablet TAKE 1 TABLET BY MOUTH EVERY DAY WITH OR IMMEDIATELY FOLLOWING A MEAL 90 tablet 1   Multiple Vitamin (MULTIVITAMIN) tablet Take 1 tablet by mouth daily.     NONFORMULARY OR COMPOUNDED ITEM [facility]:  Peripheral Neuropathy Cream - Bupivacaine 1%, Doxepin 3%, Gabapentin 6%, Pentoxifylline 3%, Topiramate 1%, apply 1-2 grams to affected ares 3-4 times a day prn. (Patient taking differently: [facility]:  Peripheral Neuropathy Cream - Bupivacaine 1%, Doxepin 3%, Gabapentin 6%, Pentoxifylline 3%, Topiramate 1%, apply 1-2 grams to affected ares 3-4 times a day prn.) 100 each 5   ONE TOUCH ULTRA TEST test  strip USE FOUR TIMES DAILY BEFORE MEALS AND EVERY NIGHT AT BEDTIME 150 each 5   pantoprazole (PROTONIX) 40 MG tablet TAKE 1 TABLET BY MOUTH EVERY DAY 90 tablet 1   pioglitazone (ACTOS) 30 MG tablet TAKE 1 TABLET BY MOUTH EVERY DAY 90 tablet 3   polyvinyl alcohol (LIQUIFILM TEARS) 1.4 % ophthalmic solution 1 drop as needed for dry eyes.     Pyridoxine HCl (VITAMIN B-6) 100 MG tablet Take 100 mg by mouth daily.     rosuvastatin (CRESTOR) 20 MG tablet Take 20 mg by mouth daily.     TRULICITY 1.5 OP/9.2TW SOPN INJECT 1.$RemoveBefor'5MG'LZVChrniDLcs$  (1 PEN) UNDER THE SKIN ONCE A WEEK 2 mL 3   Current Facility-Administered Medications on File Prior to Visit  Medication Dose Route Frequency Provider Last Rate Last Admin   denosumab (PROLIA) injection 60 mg  60 mg Subcutaneous Q6 months [name], MD   60 mg at 11/04/20 1409   Allergies  Allergen Reactions   Lipitor [Atorvastatin Calcium] Diarrhea   Niacin And Related Hives   Statins Diarrhea   Macrobid [Nitrofurantoin Macrocrystal] Other (See Comments)    unknown   Social History   Socioeconomic History   Marital status: Widowed    Spouse name: Not on file   Number of children: 3   Years of education: Not on file   Highest education level: Not on file  Occupational History   Occupation: Retired  Tobacco Use   Smoking status: Never   Smokeless tobacco: Never  Vaping Use   Vaping Use: Not on file  Substance and Sexual Activity   Alcohol use: No   Drug use: No   Sexual activity: Not Currently  Other Topics Concern   Not on file  Social History Narrative   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     Objective:   Physical Exam Vitals reviewed.  Constitutional:      General: She is not in acute distress.    Appearance: Normal appearance. She is  well-developed. She is obese. She is not ill-appearing, toxic-appearing or diaphoretic.  HENT:     Head: Normocephalic and atraumatic.  Neck:     Vascular: No JVD.  Cardiovascular:     Rate and Rhythm: Normal rate and regular rhythm.     Heart sounds: Normal heart sounds. No murmur heard.   No friction rub. No gallop.  Pulmonary:     Effort: Pulmonary effort is normal. No respiratory distress.     Breath sounds: No stridor. No wheezing or rhonchi.    Musculoskeletal:     Right lower leg: No edema.     Left lower leg: No edema.  Skin:    Findings: No erythema.  Neurological:     General: No focal deficit present.     Mental Status: She is alert. Mental status is at baseline.     Cranial Nerves: No cranial nerve deficit.     Sensory: No sensory deficit.     Motor: No weakness.     Coordination: Coordination normal.     Gait: Gait normal.          Assessment & Plan:  Memory loss  Depression, major, single episode, moderate (HCC) Patient with dementia is now complicated by major depressive disorder.  Begin Lexapro 10 mg daily.  Recheck in 6 weeks or sooner if worsening.  Consider adding Namenda at next visit.

## 2021-09-23 ENCOUNTER — Encounter: Payer: Self-pay | Admitting: Podiatry

## 2021-09-23 ENCOUNTER — Ambulatory Visit: Payer: Medicare PPO | Admitting: Podiatry

## 2021-09-23 DIAGNOSIS — B351 Tinea unguium: Secondary | ICD-10-CM

## 2021-09-23 DIAGNOSIS — E1142 Type 2 diabetes mellitus with diabetic polyneuropathy: Secondary | ICD-10-CM | POA: Diagnosis not present

## 2021-09-23 DIAGNOSIS — M2042 Other hammer toe(s) (acquired), left foot: Secondary | ICD-10-CM

## 2021-09-23 DIAGNOSIS — L89891 Pressure ulcer of other site, stage 1: Secondary | ICD-10-CM | POA: Diagnosis not present

## 2021-09-23 DIAGNOSIS — E119 Type 2 diabetes mellitus without complications: Secondary | ICD-10-CM | POA: Diagnosis not present

## 2021-09-23 DIAGNOSIS — M79675 Pain in left toe(s): Secondary | ICD-10-CM | POA: Diagnosis not present

## 2021-09-23 DIAGNOSIS — M79674 Pain in right toe(s): Secondary | ICD-10-CM

## 2021-09-23 DIAGNOSIS — S90421A Blister (nonthermal), right great toe, initial encounter: Secondary | ICD-10-CM

## 2021-09-23 DIAGNOSIS — M2041 Other hammer toe(s) (acquired), right foot: Secondary | ICD-10-CM

## 2021-09-26 NOTE — Progress Notes (Signed)
ANNUAL DIABETIC FOOT EXAM  Subjective: Rebecca Perkins presents today for for annual diabetic foot examination.  Patient relates dx of diabetes.  Patient denies any h/o foot wounds.  Patient admits symptoms of numbness in foot/feet.  Patient's blood sugar was 156 mg/dl today.   Risk factors: diabetes, diabetic neuropathy, PAD, h/o CVA, h/o TIA, hyperlipidemia.  [name], MD is patient's PCP. Last visit was September 22, 2021  Patient is accompanied by her caregiver, Rebecca Perkins, on today's visit. Rebecca Perkins states Rebecca Perkins developed a blister on her right great toe. It is suspected this occurred when she wore dress shoes to church on Sunday. They have been keeping it cleaned and dressed.  Rebecca Perkins states Rebecca Perkins is established with [facility] on [address].            Past Medical History:  Diagnosis Date   Anemia    Diabetes mellitus type 2, insulin dependent ([place])    Diabetic retinopathy ([place])    NPDR OU   Dyslipidemia    GERD (gastroesophageal reflux disease)    Hypertension    Hypertensive retinopathy    OU   Migraines    Osteoporosis    Pneumonia    PVD (peripheral vascular disease) ([place])    abi .83 (L), .92 (R)   RSD (reflex sympathetic dystrophy) 2007   R wrist/hand following fx   Stroke [facility])    TIA (transient ischemic attack)    Varicose veins    Patient Active Problem List   Diagnosis Date Noted   Bilateral impacted cerumen 02/22/2018   Conductive hearing loss, bilateral 02/22/2018   HTN (hypertension) 09/04/2017   GERD (gastroesophageal reflux disease) 09/04/2017   Diabetes mellitus type 2, insulin dependent ([place]) 09/04/2017   Anemia 09/04/2017   HLD (hyperlipidemia) 09/04/2017   CKD (chronic kidney disease) stage 3, GFR 30-59 ml/min (HCC) 09/04/2017   Aphasia 09/01/2015   TIA (transient ischemic attack) 06/17/2015   Osteoporosis 02/18/2015   Normocytic anemia 12/13/2013   Diabetes mellitus type II,  uncontrolled ([place]) 12/10/2013   Pneumonia 12/10/2013   Sepsis ([place]) 12/09/2013   Type 2 diabetes mellitus with insulin deficiency ([place])    Hypertension    Hypertriglyceridemia    Dyslipidemia    Past Surgical History:  Procedure Laterality Date   [place]   BREAST CYST EXCISION     CATARACT EXTRACTION Bilateral 10/2009   CHOLECYSTECTOMY  1989   EYE SURGERY Bilateral    Cat Sx OU   Several benign cyst removed     last 1 in [place]     Current Outpatient Medications on File Prior to Visit  Medication Sig Dispense Refill   albuterol (VENTOLIN HFA) 108 (90 Base) MCG/ACT inhaler Inhale 1-2 puffs into the lungs in the morning and at bedtime. 8 g 11   Blood Glucose Monitoring Suppl (ONE TOUCH ULTRA SYSTEM KIT) W/DEVICE KIT 1 kit by Does not apply route once. Tests Blood sugar before meals and at bedtime 1 each 0   Calcium Carbonate-Vitamin D (CALTRATE 600+D PO) Take by mouth 2 (two) times daily.     clopidogrel (PLAVIX) 75 MG tablet TAKE 1 TABLET BY MOUTH EVERY DAY 90 tablet 3   Coenzyme Q10 (COQ10 PO) Take by mouth.     Continuous Blood Gluc Sensor (FREESTYLE LIBRE 14 DAY SENSOR) MISC USE EVERY 14 DAYS 6 each 3  denosumab (PROLIA) 60 MG/ML SOSY injection Inject into the skin every 6 (six) months.      donepezil (ARICEPT) 10 MG tablet TAKE 1 TABLET BY MOUTH EVERYDAY AT BEDTIME 90 tablet 3   escitalopram (LEXAPRO) 10 MG tablet Take 1 tablet (10 mg total) by mouth daily. 30 tablet 2   fenofibrate 160 MG tablet TAKE 1 TABLET BY MOUTH EVERY DAY 90 tablet 3   fluticasone (FLONASE) 50 MCG/ACT nasal spray SPRAY 2 SPRAYS INTO EACH NOSTRIL EVERY DAY 48 mL 2   hydrochlorothiazide (MICROZIDE) 12.5 MG capsule Take 1 capsule (12.5MG) by mouth daily. 90 capsule 1   icosapent Ethyl (VASCEPA) 1 g capsule TAKE 2 CAPSULES BY MOUTH 2 TIMES DAILY. 120 capsule 11   insulin lispro (HUMALOG) 100 UNIT/ML KwikPen Inject SQ QD as  directed per sliding scale-  <60 call MD, 100-150 0U, 151-200 2U, 201- 250 4U, 251- 300 6U, 301- 350 8U, 351- 400 10U 401- 450 12U,  >450 15U call MD 15 mL 3   Insulin Pen Needle (B-D ULTRAFINE III SHORT PEN) 31G X 8 MM MISC INJECT AS DIRECTED 4 TIMES DAILY 300 each 2   INSULIN SYRINGE 1CC/29G 29G X 1/2" 1 ML MISC AS DIRECTED. 100 each 3   KLOR-CON M20 20 MEQ tablet TAKE 1 TABLET BY MOUTH EVERY DAY 90 tablet 2   losartan (COZAAR) 100 MG tablet Take 1 tablet (100 mg total) by mouth daily. 90 tablet 3   metoprolol succinate (TOPROL-XL) 50 MG 24 hr tablet TAKE 1 TABLET BY MOUTH EVERY DAY WITH OR IMMEDIATELY FOLLOWING A MEAL 90 tablet 1   Multiple Vitamin (MULTIVITAMIN) tablet Take 1 tablet by mouth daily.     NONFORMULARY OR COMPOUNDED ITEM [facility]:  Peripheral Neuropathy Cream - Bupivacaine 1%, Doxepin 3%, Gabapentin 6%, Pentoxifylline 3%, Topiramate 1%, apply 1-2 grams to affected ares 3-4 times a day prn. (Patient taking differently: [facility]:  Peripheral Neuropathy Cream - Bupivacaine 1%, Doxepin 3%, Gabapentin 6%, Pentoxifylline 3%, Topiramate 1%, apply 1-2 grams to affected ares 3-4 times a day prn.) 100 each 5   ONE TOUCH ULTRA TEST test strip USE FOUR TIMES DAILY BEFORE MEALS AND EVERY NIGHT AT BEDTIME 150 each 5   pantoprazole (PROTONIX) 40 MG tablet TAKE 1 TABLET BY MOUTH EVERY DAY 90 tablet 1   pioglitazone (ACTOS) 30 MG tablet TAKE 1 TABLET BY MOUTH EVERY DAY 90 tablet 3   polyvinyl alcohol (LIQUIFILM TEARS) 1.4 % ophthalmic solution 1 drop as needed for dry eyes.     Pyridoxine HCl (VITAMIN B-6) 100 MG tablet Take 100 mg by mouth daily.     rosuvastatin (CRESTOR) 20 MG tablet Take 20 mg by mouth daily.     TRULICITY 1.5 OV/2.9VB SOPN INJECT 1.5MG (1 PEN) UNDER THE SKIN ONCE A WEEK 2 mL 3   Current Facility-Administered Medications on File Prior to Visit  Medication Dose Route Frequency Provider Last Rate Last Admin   denosumab (PROLIA) injection 60 mg  60 mg  Subcutaneous Q6 months [name], MD   60 mg at 11/04/20 1409    Allergies  Allergen Reactions   Lipitor [Atorvastatin Calcium] Diarrhea   Niacin And Related Hives   Statins Diarrhea   Macrobid [Nitrofurantoin Macrocrystal] Other (See Comments)    unknown   Social History   Occupational History   Occupation: Retired  Tobacco Use   Smoking status: Never   Smokeless tobacco: Never  Vaping Use   Vaping Use: Not on file  Substance  and Sexual Activity   Alcohol use: No   Drug use: No   Sexual activity: Not Currently   Family History  Problem Relation Age of Onset   Stroke Mother 41   Hypertension Mother    Clotting disorder Father    Heart attack Father    Arrhythmia Sister    Stroke Brother    Thyroid disease Neg Hx    Immunization History  Administered Date(s) Administered   Fluad Quad(high Dose 65+) 04/30/2019, 04/28/2020, 05/12/2021   Influenza Split 05/05/2011   Influenza, High Dose Seasonal PF 05/03/2015, 04/30/2016, 05/16/2018   Influenza,inj,Quad PF,6+ Mos 04/25/2014   Influenza-Unspecified 05/06/2012, 04/02/2013, 04/23/2016   PFIZER(Purple Top)SARS-COV-2 Vaccination 08/27/2019, 09/17/2019, 05/21/2020   Pneumococcal Conjugate-13 08/13/2013   Pneumococcal Polysaccharide-23 08/30/2016   Tdap 12/15/2016   Zoster Recombinat (Shingrix) 10/16/2016, 12/15/2016     Review of Systems: Negative except as noted in the HPI.   Objective: There were no vitals filed for this visit.  Rebecca Perkins is a pleasant 88 y.o. female in NAD. AAO X 3.  Vascular Examination: CFT <3 seconds b/l LE. Palpable pedal pulses b/l LE. Pedal hair absent. No pain with calf compression b/l. Lower extremity skin temperature gradient within normal limits. Patient wearing compression hose on today's visit. No edema noted b/l LE. No ischemia or gangrene noted b/l LE. No cyanosis or clubbing noted b/l LE.  Dermatological Examination: No interdigital macerations noted b/l LE. Toenails  1-5 bilaterally elongated, discolored, dystrophic, thickened, and crumbly with subungual debris and tenderness to dorsal palpation. Evidence of blister roof on dorsal aspect of right great toe. Preulcerative lesion lateral aspect of left 4th digit.  Musculoskeletal Examination: Muscle strength 5/5 to all lower extremity muscle groups bilaterally. HAV with bunion deformity noted b/l LE. Hammertoe deformity noted 2-5 b/l.  Footwear Assessment: Does the patient wear appropriate shoes? Yes. Does the patient need inserts/orthotics? Yes.  Neurological Examination: Protective sensation diminished with 10g monofilament b/l.  Hemoglobin A1C Latest Ref Rng & Units 03/27/2021 11/11/2020  HGBA1C <5.7 % of total Hgb 6.9(H) 6.1(H)  Some recent data might be hidden   Assessment: 1. Pain due to onychomycosis of toenails of both feet   2. Blister of great toe of right foot, initial encounter   3. Pressure injury of toe of left foot, stage 1   4. Acquired hammertoes of both feet   5. Diabetic peripheral neuropathy associated with type 2 diabetes mellitus ([place])   6. Encounter for diabetic foot exam ([place])     ADA Risk Categorization: High Risk  Patient has one or more of the following: Loss of protective sensation Absent pedal pulses Severe Foot deformity History of foot ulcer  Plan: -Diabetic foot examination performed today. -Continue foot and shoe inspections daily. Monitor blood glucose per PCP/Endocrinologist's recommendations. -Mycotic toenails 1-5 bilaterally were debrided in length and girth with sterile nail nippers and dremel without incident. -Right great toe cleansed with wound cleanser and light dressing applied. Band-aid applied to left 4th toe. Will fax orders to [facility] for management of right great toe. -Patient/POA to call should there be question/concern in the interim.  Return in about 3 months (around 12/21/2021).  Marzetta Board, DPM

## 2021-10-12 ENCOUNTER — Telehealth: Payer: Self-pay | Admitting: Family Medicine

## 2021-10-12 MED ORDER — FREESTYLE LIBRE 14 DAY SENSOR MISC: 3 refills | Status: DC

## 2021-10-12 NOTE — Telephone Encounter (Signed)
[name] son stopped by states patient has changed insurances to [facility] they need a new Prior Auth on [place] you can contact them at [phone]. He states that she has 1 box left. Please call him when this has been taken care of. [phone] or [phone] ?

## 2021-10-12 NOTE — Telephone Encounter (Signed)
Received eFax from pharmacy to request refill of ? ?Continuous Blood Gluc Sensor (FREESTYLE LIBRE [place]) [place] [[id number]]  ? ?Pharmacy fax received from: ? ?[facility] ? ? ?Please advise pharmacist at [phone], or fax to [phone]  ?

## 2021-10-12 NOTE — Telephone Encounter (Signed)
Rx sent to pharmacy   

## 2021-10-13 DIAGNOSIS — Z961 Presence of intraocular lens: Secondary | ICD-10-CM | POA: Diagnosis not present

## 2021-10-13 DIAGNOSIS — H02002 Unspecified entropion of right lower eyelid: Secondary | ICD-10-CM | POA: Diagnosis not present

## 2021-10-13 DIAGNOSIS — H02005 Unspecified entropion of left lower eyelid: Secondary | ICD-10-CM | POA: Diagnosis not present

## 2021-10-13 DIAGNOSIS — E113293 Type 2 diabetes mellitus with mild nonproliferative diabetic retinopathy without macular edema, bilateral: Secondary | ICD-10-CM | POA: Diagnosis not present

## 2021-10-13 NOTE — Telephone Encounter (Signed)
Per [facility] patient must use DME provider to get sensors. They did provide recommended companies: ?[facility] ?[facility] [employer] ?[facility] [employer] ? ?Spoke with pt's son, [name]. He would like to use [facility] [phone]. ? ?Spoke with [facility] and they will not allow provider's office to set up patient account. Spoke with pt's son, [name], and provided ph nbr [phone] to call. He will let us know when this has been completed.  ? ? ?

## 2021-10-16 ENCOUNTER — Other Ambulatory Visit: Payer: Self-pay | Admitting: Family Medicine

## 2021-10-18 ENCOUNTER — Other Ambulatory Visit: Payer: Self-pay | Admitting: Family Medicine

## 2021-10-18 DIAGNOSIS — M7989 Other specified soft tissue disorders: Secondary | ICD-10-CM

## 2021-10-18 DIAGNOSIS — R059 Cough, unspecified: Secondary | ICD-10-CM

## 2021-10-21 DIAGNOSIS — H02002 Unspecified entropion of right lower eyelid: Secondary | ICD-10-CM | POA: Diagnosis not present

## 2021-10-21 DIAGNOSIS — H02005 Unspecified entropion of left lower eyelid: Secondary | ICD-10-CM | POA: Diagnosis not present

## 2021-10-22 ENCOUNTER — Telehealth: Payer: Self-pay | Admitting: Family Medicine

## 2021-10-22 NOTE — Progress Notes (Signed)
?[facility] Note ? ?10/28/2021 ? ?  ? ?CHIEF COMPLAINT ?Patient presents for Retina Follow Up ? ? ? ?HISTORY OF PRESENT ILLNESS: ?Rebecca Perkins is a 88 y.o. female who presents to the clinic today for:  ? ?HPI   ? ? Retina Follow Up   ?Patient presents with  Diabetic Retinopathy.  In right eye.  Severity is moderate.  Duration of 3 months.  Since onset it is stable.  I, the attending physician,  performed the HPI with the patient and updated documentation appropriately. ? ?  ?  ? ? Comments   ?Pt here for 3 mo ret f/u fpr NPDR OD. Pt states VA is about the same. She just saw Dr. Ellie Lunch last week for a surgical procedure on lower eye lids due to eyelashes irritating cornea. Pt is on a Refresh gtt TID and refresh ointment  QHS no rx gtts.  ? ?  ?  ?Last edited by Bernarda Caffey, MD on 10/28/2021  1:37 PM.  ?  ?Pt saw Dr. Ellie Lunch last week for a surgical procedure to sew her bottom eyelids down bc they were turning in and her eyelashes were scratching her cornea, pt states no change in vision, son states her blood sugar is doing much better since she was here last ? ? ?Referring physician: ?[name], MD ?[address] [place],  [place] [address] ? ?HISTORICAL INFORMATION:  ? ?Selected notes from the [facility] ?Referred by Dr. Luberta Mutter for concern of DME  ? ?CURRENT MEDICATIONS: ?Current Outpatient Medications (Ophthalmic Drugs)  ?Medication Sig  ? polyvinyl alcohol (LIQUIFILM TEARS) 1.4 % ophthalmic solution 1 drop as needed for dry eyes.  ? ?No current facility-administered medications for this visit. (Ophthalmic Drugs)  ? ?Current Outpatient Medications (Other)  ?Medication Sig  ? albuterol (VENTOLIN HFA) 108 (90 Base) MCG/ACT inhaler Inhale 1-2 puffs into the lungs in the morning and at bedtime.  ? Blood Glucose Monitoring Suppl (ONE TOUCH ULTRA SYSTEM KIT) W/DEVICE KIT 1 kit by Does not apply route once. Tests Blood sugar before meals and at bedtime  ?  Calcium Carbonate-Vitamin D (CALTRATE 600+D PO) Take by mouth 2 (two) times daily.  ? clopidogrel (PLAVIX) 75 MG tablet TAKE 1 TABLET BY MOUTH EVERY DAY  ? Coenzyme Q10 (COQ10 PO) Take by mouth.  ? denosumab (PROLIA) 60 MG/ML SOSY injection Inject into the skin every 6 (six) months.   ? donepezil (ARICEPT) 10 MG tablet TAKE 1 TABLET BY MOUTH EVERYDAY AT BEDTIME  ? Dulaglutide (TRULICITY) 1.5 HR/4.1UL SOPN INJECT 1.$RemoveBefor'5MG'eDHuaXHmBzjh$  UNDER THE SKIN ONCE A WEEK  ? escitalopram (LEXAPRO) 10 MG tablet TAKE 1 TABLET BY MOUTH EVERY DAY  ? fenofibrate 160 MG tablet TAKE 1 TABLET BY MOUTH EVERY DAY  ? fluticasone (FLONASE) 50 MCG/ACT nasal spray SPRAY 2 SPRAYS INTO EACH NOSTRIL EVERY DAY  ? hydrochlorothiazide (MICROZIDE) 12.5 MG capsule Take 1 capsule (12.$RemoveBefor'5MG'GiSwOzZNgkaX$ ) by mouth daily.  ? icosapent Ethyl (VASCEPA) 1 g capsule TAKE 2 CAPSULES BY MOUTH 2 TIMES DAILY.  ? insulin lispro (HUMALOG) 100 UNIT/ML KwikPen Inject SQ QD as directed per sliding scale-  <60 call MD, 100-150 0U, 151-200 2U, 201- 250 4U, 251- 300 6U, 301- 350 8U, 351- 400 10U 401- 450 12U,  >450 15U call MD  ? Insulin Pen Needle (B-D ULTRAFINE III SHORT PEN) 31G X 8 MM MISC INJECT AS DIRECTED 4 TIMES DAILY  ? INSULIN SYRINGE 1CC/29G 29G X 1/2" 1  ML MISC AS DIRECTED.  ? KLOR-CON M20 20 MEQ tablet TAKE 1 TABLET BY MOUTH EVERY DAY  ? losartan (COZAAR) 100 MG tablet Take 1 tablet (100 mg total) by mouth daily.  ? metoprolol succinate (TOPROL-XL) 50 MG 24 hr tablet TAKE 1 TABLET BY MOUTH EVERY DAY WITH OR IMMEDIATELY FOLLOWING A MEAL  ? Multiple Vitamin (MULTIVITAMIN) tablet Take 1 tablet by mouth daily.  ? NONFORMULARY OR COMPOUNDED ITEM [facility]:  Peripheral Neuropathy Cream - Bupivacaine 1%, Doxepin 3%, Gabapentin 6%, Pentoxifylline 3%, Topiramate 1%, apply 1-2 grams to affected ares 3-4 times a day prn. (Patient taking differently: [facility]:  Peripheral Neuropathy Cream - Bupivacaine 1%, Doxepin 3%, Gabapentin 6%, Pentoxifylline 3%, Topiramate 1%, apply 1-2  grams to affected ares 3-4 times a day prn.)  ? ONE TOUCH ULTRA TEST test strip USE FOUR TIMES DAILY BEFORE MEALS AND EVERY NIGHT AT BEDTIME  ? pantoprazole (PROTONIX) 40 MG tablet TAKE 1 TABLET BY MOUTH EVERY DAY  ? pioglitazone (ACTOS) 30 MG tablet TAKE 1 TABLET BY MOUTH EVERY DAY  ? Pyridoxine HCl (VITAMIN B-6) 100 MG tablet Take 100 mg by mouth daily.  ? rosuvastatin (CRESTOR) 20 MG tablet Take 20 mg by mouth daily.  ? Continuous Blood Gluc Sensor (FREESTYLE LIBRE 14 DAY SENSOR) MISC USE EVERY 14 DAYS  ? ?Current Facility-Administered Medications (Other)  ?Medication Route  ? denosumab (PROLIA) injection 60 mg Subcutaneous  ? ?REVIEW OF SYSTEMS: ?ROS   ?Positive for: Gastrointestinal, Neurological, Genitourinary, Musculoskeletal, HENT, Endocrine, Eyes ?Negative for: Constitutional, Skin, Cardiovascular, Respiratory, Psychiatric, Allergic/Imm, Heme/Lymph ?Last edited by Kingsley Spittle, COT on 10/28/2021  1:16 PM.  ?  ? ?ALLERGIES ?Allergies  ?Allergen Reactions  ? Lipitor [Atorvastatin Calcium] Diarrhea  ? Niacin And Related Hives  ? Statins Diarrhea  ? Macrobid [Nitrofurantoin Macrocrystal] Other (See Comments)  ?  unknown  ? ?PAST MEDICAL HISTORY ?Past Medical History:  ?Diagnosis Date  ? Anemia   ? Diabetes mellitus type 2, insulin dependent ([place])   ? Diabetic retinopathy ([place])   ? NPDR OU  ? Dyslipidemia   ? GERD (gastroesophageal reflux disease)   ? Hypertension   ? Hypertensive retinopathy   ? OU  ? Migraines   ? Osteoporosis   ? Pneumonia   ? PVD (peripheral vascular disease) ([place])   ? abi .83 (L), .92 (R)  ? RSD (reflex sympathetic dystrophy) 2007  ? R wrist/hand following fx  ? Stroke [facility])   ? TIA (transient ischemic attack)   ? Varicose veins   ? ?Past Surgical History:  ?Procedure Laterality Date  ? ABDOMINAL HYSTERECTOMY  1988  ? APPENDECTOMY  1966  ? BREAST CYST EXCISION    ? CATARACT EXTRACTION Bilateral 10/2009  ? CHOLECYSTECTOMY  1989  ? EYE SURGERY Bilateral   ? Cat Sx OU  ? Several benign  cyst removed    ? last 1 in 1972  ? TUBAL LIGATION    ? UMBILICAL HERNIA REPAIR    ? ?FAMILY HISTORY ?Family History  ?Problem Relation Age of Onset  ? Stroke Mother 31  ? Hypertension Mother   ? Clotting disorder Father   ? Heart attack Father   ? Arrhythmia Sister   ? Stroke Brother   ? Thyroid disease Neg Hx   ? ?SOCIAL HISTORY ?Social History  ? ?Tobacco Use  ? Smoking status: Never  ? Smokeless tobacco: Never  ?Substance Use Topics  ? Alcohol use: No  ? Drug use: No  ?  ? ?  ?  OPHTHALMIC EXAM: ? ?Base Eye Exam   ? ? Visual Acuity (Snellen - Linear)   ? ?   Right Left  ? Dist [place] 20/25 -2 20/25  ? ?  ?  ? ? Tonometry (Tonopen, 1:23 PM)   ? ?   Right Left  ? Pressure 16 15  ? ?  ?  ? ? Pupils   ? ?   Dark Light Shape React APD  ? Right 2 1 Round Minimal None  ? Left 2 1 Round Minimal None  ? ?  ?  ? ? Visual Fields (Counting fingers)   ? ?   Left Right  ?  Full Full  ? ?  ?  ? ? Extraocular Movement   ? ?   Right Left  ?  Full, Ortho Full, Ortho  ? ?  ?  ? ? Neuro/Psych   ? ? Oriented x3: Yes  ? Mood/Affect: Normal  ? ?  ?  ? ? Dilation   ? ? Both eyes: 1.0% Mydriacyl, 2.5% Phenylephrine @ 1:24 PM  ? ?  ?  ? ?  ? ?Slit Lamp and Fundus Exam   ? ? Slit Lamp Exam   ? ?   Right Left  ? Lids/Lashes Meibomian gland dysfunction, Dermatochalasis - upper lid, Ptosis, 3 sutures intact LL Meibomian gland dysfunction, Dermatochalasis - upper lid, Ptosis, 3 sutures intact LL  ? Conjunctiva/Sclera White and quiet White and quiet  ? Cornea 2-3+Punctate epithelial erosions, Arcus, dry tear film 3+Punctate epithelial erosions, Arcus, dry tear film  ? Anterior Chamber Deep and quiet Deep and quiet  ? Iris Round and dilated, No NVI Round and dilated, No NVI  ? Lens PCIOL in good position with open PC PCIOL in good position with open PC  ? Anterior Vitreous mild Vitreous syneresis mild Vitreous syneresis  ? ?  ?  ? ? Fundus Exam   ? ?   Right Left  ? Disc Pink and Sharp trace Pallor, Sharp rim  ? C/D Ratio 0.4 0.5  ? Macula Flat,  Blunted foveal reflex, scattered Microaneurysms/IRH, cluster of IRH and exudate just inside ST arcades -- slightly improved, light focal laser scars ST macula Flat, blunted foveal reflex, scattered MA, no ede

## 2021-10-22 NOTE — Telephone Encounter (Signed)
Received call from [facility] to check on status of physician order form previously faxed.  ? ?Form faxed again today; receipt confirmed. ? ?[facility] requesting for provider to complete and sign form including DX code (#4 on form) and fax back to [phone]. Form placed in hanging folder at nurse's desk. ? ?Please advise [facility]rica at [phone] with questions.  ?

## 2021-10-28 ENCOUNTER — Ambulatory Visit (INDEPENDENT_AMBULATORY_CARE_PROVIDER_SITE_OTHER): Payer: Medicare PPO | Admitting: Ophthalmology

## 2021-10-28 ENCOUNTER — Encounter (INDEPENDENT_AMBULATORY_CARE_PROVIDER_SITE_OTHER): Payer: Self-pay | Admitting: Ophthalmology

## 2021-10-28 DIAGNOSIS — H35033 Hypertensive retinopathy, bilateral: Secondary | ICD-10-CM | POA: Diagnosis not present

## 2021-10-28 DIAGNOSIS — E113311 Type 2 diabetes mellitus with moderate nonproliferative diabetic retinopathy with macular edema, right eye: Secondary | ICD-10-CM | POA: Diagnosis not present

## 2021-10-28 DIAGNOSIS — Z961 Presence of intraocular lens: Secondary | ICD-10-CM

## 2021-10-28 DIAGNOSIS — H04123 Dry eye syndrome of bilateral lacrimal glands: Secondary | ICD-10-CM | POA: Diagnosis not present

## 2021-10-28 DIAGNOSIS — I10 Essential (primary) hypertension: Secondary | ICD-10-CM | POA: Diagnosis not present

## 2021-10-29 ENCOUNTER — Telehealth: Payer: Self-pay

## 2021-10-29 DIAGNOSIS — E119 Type 2 diabetes mellitus without complications: Secondary | ICD-10-CM

## 2021-10-29 NOTE — Telephone Encounter (Signed)
Pt's sone called in about possibly getting another Continuous Blood Gluc Sensor ([place] for pt up until the new monitor is delivered. Pt used the last use of this monitor today. Pt's son states she will not be able to monitor her blood glucose. Please advise. ? ?Cb#: [phone] ? ?

## 2021-10-30 ENCOUNTER — Ambulatory Visit: Payer: Medicare PPO | Admitting: Family Medicine

## 2021-10-30 VITALS — BP 122/58 | HR 54 | Temp 96.6°F | Ht 64.0 in | Wt 120.8 lb

## 2021-10-30 DIAGNOSIS — S0093XA Contusion of unspecified part of head, initial encounter: Secondary | ICD-10-CM

## 2021-10-30 MED ORDER — FREESTYLE LIBRE 14 DAY SENSOR MISC: 3 refills | Status: AC

## 2021-10-30 NOTE — Progress Notes (Signed)
? ?Subjective:  ? ? Patient ID: Rebecca Perkins, female    DOB: 10/19/1928, 88 y.o.   MRN: [id number] ?Patient is a very sweet 88 year old Caucasian female here today with her family.  She has fallen 6 times despite using her cane.  1 week ago she fell and struck the superior portion of her forehead.  Today she has a large bruise encompassing her entire forehead with yellowish discoloration over the forehead and a bright purple hematoma in her left upper eyelid.  However she is in good spirits.  She is laughing and smiling and joking.  Her mental status is at her baseline.  Extraocular movement is intact.  Pupils are equal round reactive to light.  Cranial nerves II through XII are grossly intact with muscle strength 5/5 equal and symmetric in the upper and lower extremities.  She denies any headache or nausea or vomiting.  Family denies any worsening confusion.  However the bruise is getting larger and that has them concerned. ?Past Medical History:  ?Diagnosis Date  ? Anemia   ? Diabetes mellitus type 2, insulin dependent ([place])   ? Diabetic retinopathy ([place])   ? NPDR OU  ? Dyslipidemia   ? GERD (gastroesophageal reflux disease)   ? Hypertension   ? Hypertensive retinopathy   ? OU  ? Migraines   ? Osteoporosis   ? Pneumonia   ? PVD (peripheral vascular disease) ([place])   ? abi .83 (L), .92 (R)  ? RSD (reflex sympathetic dystrophy) 2007  ? R wrist/hand following fx  ? Stroke [facility])   ? TIA (transient ischemic attack)   ? Varicose veins   ? ?Past Surgical History:  ?Procedure Laterality Date  ? ABDOMINAL HYSTERECTOMY  1988  ? APPENDECTOMY  1966  ? BREAST CYST EXCISION    ? CATARACT EXTRACTION Bilateral 10/2009  ? CHOLECYSTECTOMY  1989  ? EYE SURGERY Bilateral   ? Cat Sx OU  ? Several benign cyst removed    ? last 1 in 1972  ? TUBAL LIGATION    ? UMBILICAL HERNIA REPAIR    ? ?Current Outpatient Medications on File Prior to Visit  ?Medication Sig Dispense Refill  ? albuterol (VENTOLIN HFA) 108 (90 Base) MCG/ACT inhaler  Inhale 1-2 puffs into the lungs in the morning and at bedtime. 8 g 11  ? Blood Glucose Monitoring Suppl (ONE TOUCH ULTRA SYSTEM KIT) W/DEVICE KIT 1 kit by Does not apply route once. Tests Blood sugar before meals and at bedtime 1 each 0  ? Calcium Carbonate-Vitamin D (CALTRATE 600+D PO) Take by mouth 2 (two) times daily.    ? clopidogrel (PLAVIX) 75 MG tablet TAKE 1 TABLET BY MOUTH EVERY DAY 90 tablet 3  ? Coenzyme Q10 (COQ10 PO) Take by mouth.    ? Continuous Blood Gluc Sensor (FREESTYLE LIBRE 14 DAY SENSOR) MISC USE EVERY 14 DAYS 6 each 3  ? denosumab (PROLIA) 60 MG/ML SOSY injection Inject into the skin every 6 (six) months.     ? donepezil (ARICEPT) 10 MG tablet TAKE 1 TABLET BY MOUTH EVERYDAY AT BEDTIME 90 tablet 3  ? Dulaglutide (TRULICITY) 1.5 HV/7.4BB SOPN INJECT 1.$RemoveBefor'5MG'XeGIdWwWfMwU$  UNDER THE SKIN ONCE A WEEK 2 mL 3  ? escitalopram (LEXAPRO) 10 MG tablet TAKE 1 TABLET BY MOUTH EVERY DAY 90 tablet 1  ? fenofibrate 160 MG tablet TAKE 1 TABLET BY MOUTH EVERY DAY 90 tablet 3  ? fluticasone (FLONASE) 50 MCG/ACT nasal spray SPRAY 2 SPRAYS INTO EACH NOSTRIL EVERY DAY 48 mL  2  ? hydrochlorothiazide (MICROZIDE) 12.5 MG capsule Take 1 capsule (12.$RemoveBefor'5MG'NHGvTGtyHlDs$ ) by mouth daily. 90 capsule 1  ? icosapent Ethyl (VASCEPA) 1 g capsule TAKE 2 CAPSULES BY MOUTH 2 TIMES DAILY. 120 capsule 11  ? insulin lispro (HUMALOG) 100 UNIT/ML KwikPen Inject SQ QD as directed per sliding scale-  <60 call MD, 100-150 0U, 151-200 2U, 201- 250 4U, 251- 300 6U, 301- 350 8U, 351- 400 10U 401- 450 12U,  >450 15U call MD 15 mL 3  ? Insulin Pen Needle (B-D ULTRAFINE III SHORT PEN) 31G X 8 MM MISC INJECT AS DIRECTED 4 TIMES DAILY 300 each 2  ? INSULIN SYRINGE 1CC/29G 29G X 1/2" 1 ML MISC AS DIRECTED. 100 each 3  ? KLOR-CON M20 20 MEQ tablet TAKE 1 TABLET BY MOUTH EVERY DAY 90 tablet 2  ? losartan (COZAAR) 100 MG tablet Take 1 tablet (100 mg total) by mouth daily. 90 tablet 3  ? metoprolol succinate (TOPROL-XL) 50 MG 24 hr tablet TAKE 1 TABLET BY MOUTH EVERY DAY WITH OR  IMMEDIATELY FOLLOWING A MEAL 90 tablet 1  ? Multiple Vitamin (MULTIVITAMIN) tablet Take 1 tablet by mouth daily.    ? NONFORMULARY OR COMPOUNDED ITEM [facility]:  Peripheral Neuropathy Cream - Bupivacaine 1%, Doxepin 3%, Gabapentin 6%, Pentoxifylline 3%, Topiramate 1%, apply 1-2 grams to affected ares 3-4 times a day prn. (Patient taking differently: [facility]:  Peripheral Neuropathy Cream - Bupivacaine 1%, Doxepin 3%, Gabapentin 6%, Pentoxifylline 3%, Topiramate 1%, apply 1-2 grams to affected ares 3-4 times a day prn.) 100 each 5  ? ONE TOUCH ULTRA TEST test strip USE FOUR TIMES DAILY BEFORE MEALS AND EVERY NIGHT AT BEDTIME 150 each 5  ? pantoprazole (PROTONIX) 40 MG tablet TAKE 1 TABLET BY MOUTH EVERY DAY 90 tablet 1  ? pioglitazone (ACTOS) 30 MG tablet TAKE 1 TABLET BY MOUTH EVERY DAY 90 tablet 3  ? polyvinyl alcohol (LIQUIFILM TEARS) 1.4 % ophthalmic solution 1 drop as needed for dry eyes.    ? Pyridoxine HCl (VITAMIN B-6) 100 MG tablet Take 100 mg by mouth daily.    ? rosuvastatin (CRESTOR) 20 MG tablet Take 20 mg by mouth daily.    ? ?Current Facility-Administered Medications on File Prior to Visit  ?Medication Dose Route Frequency Provider Last Rate Last Admin  ? denosumab (PROLIA) injection 60 mg  60 mg Subcutaneous Q6 months [name], MD   60 mg at 11/04/20 1409  ? ?Allergies  ?Allergen Reactions  ? Lipitor [Atorvastatin Calcium] Diarrhea  ? Niacin And Related Hives  ? Statins Diarrhea  ? Macrobid [Nitrofurantoin Macrocrystal] Other (See Comments)  ?  unknown  ? ?Social History  ? ?Socioeconomic History  ? Marital status: Widowed  ?  Spouse name: Not on file  ? Number of children: 3  ? Years of education: Not on file  ? Highest education level: Not on file  ?Occupational History  ? Occupation: Retired  ?Tobacco Use  ? Smoking status: Never  ? Smokeless tobacco: Never  ?Vaping Use  ? Vaping Use: Not on file  ?Substance and Sexual Activity  ? Alcohol use: No  ? Drug use: No  ?  Sexual activity: Not Currently  ?Other Topics Concern  ? Not on file  ?Social History Narrative  ? 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 Married , lives with spouse of 38 y (03/2011)  ? ?Social Determinants of Health  ? ?Financial Resource Strain: Not on file  ?  Food Insecurity: Not on file  ?Transportation Needs: Not on file  ?Physical Activity: Not on file  ?Stress: Not on file  ?Social Connections: Not on file  ?Intimate Partner Violence: Not on file  ? ? ? ?Review of Systems  ?All other systems reviewed and are negative. ? ?   ?Objective:  ? Physical Exam ?Vitals reviewed.  ?Constitutional:   ?   General: She is not in acute distress. ?   Appearance: Normal appearance. She is well-developed. She is obese. She is not ill-appearing, toxic-appearing or diaphoretic.  ?HENT:  ?   Head: Normocephalic. Contusion present.  ? ?Eyes:  ?   General: No visual field deficit. ?Neck:  ?   Vascular: No JVD.  ?Cardiovascular:  ?   Rate and Rhythm: Normal rate and regular rhythm.  ?   Heart sounds: Normal heart sounds. No murmur heard. ?  No friction rub. No gallop.  ?Pulmonary:  ?   Effort: Pulmonary effort is normal. No respiratory distress.  ?   Breath sounds: No stridor. No wheezing or rhonchi.  ?Musculoskeletal:  ?   Right lower leg: No edema.  ?   Left lower leg: No edema.  ?Skin: ?   Findings: No erythema.  ?Neurological:  ?   General: No focal deficit present.  ?   Mental Status: She is alert and oriented to person, place, and time. Mental status is at baseline.  ?   GCS: GCS eye subscore is 4. GCS verbal subscore is 5. GCS motor subscore is 6.  ?   Cranial Nerves: No cranial nerve deficit, dysarthria or facial asymmetry.  ?   Sensory: Sensation is intact. No sensory deficit.  ?   Motor: Motor function is intact. No weakness, abnormal muscle tone or seizure activity.  ?   Coordination: Coordination is intact. Coordination normal.  ?   Gait: Gait is intact. Gait normal.  ?   Deep  Tendon Reflexes: Reflexes are normal and symmetric.  ? ? ? ? ? ?   ?Assessment & Plan:  ?Contusion of head, unspecified part of head, initial encounter ?Fortunately, I see no physical exam evidence of any

## 2021-10-30 NOTE — Telephone Encounter (Signed)
Orders placed, call pt ?

## 2021-11-03 DIAGNOSIS — E119 Type 2 diabetes mellitus without complications: Secondary | ICD-10-CM | POA: Diagnosis not present

## 2021-11-07 ENCOUNTER — Ambulatory Visit
Admission: EM | Admit: 2021-11-07 | Discharge: 2021-11-07 | Disposition: A | Discharge status: Home / Self Care | Payer: Medicare PPO | Attending: Family Medicine | Admitting: Family Medicine

## 2021-11-07 DIAGNOSIS — U071 COVID-19: Secondary | ICD-10-CM | POA: Diagnosis not present

## 2021-11-07 MED ORDER — MOLNUPIRAVIR EUA 200MG CAPSULE: 4.0000 | ORAL_CAPSULE | Freq: Two times a day (BID) | ORAL | 0 refills | Status: AC

## 2021-11-07 NOTE — ED Triage Notes (Signed)
Pt states she tested positive for Covid yesterday ? ?Pt's Son states she only has a cough with drainage  ? ?Denies Fever ?

## 2021-11-07 NOTE — ED Provider Notes (Signed)
?[place] ? ? ? ?CSN: [id number] ?Arrival date & time: 11/07/21  1313 ? ? ?  ? ?History   ?Chief Complaint ?Chief Complaint  ?Patient presents with  ? Covid Positive  ? ? ?HPI ?Rebecca Perkins is a 88 y.o. female.  ? ?Presenting today with 2-day history of sinus drainage, mild cough.  Tested positive for COVID yesterday, called the on-call line at her PCP office who instructed her to come to urgent care to evaluate for need for antiviral therapy.  Denies fever, chills, body aches, chest pain, shortness of breath, abdominal pain, nausea vomiting or diarrhea.  Not taking any medications over-the-counter for symptoms thus far.  History of hypertension, diabetes, hyperlipidemia, benign pulmonary nodules, history of pneumonia. ? ? ?Past Medical History:  ?Diagnosis Date  ? Anemia   ? Diabetes mellitus type 2, insulin dependent ([place])   ? Diabetic retinopathy ([place])   ? NPDR OU  ? Dyslipidemia   ? GERD (gastroesophageal reflux disease)   ? Hypertension   ? Hypertensive retinopathy   ? OU  ? Migraines   ? Osteoporosis   ? Pneumonia   ? PVD (peripheral vascular disease) ([place])   ? abi .83 (L), .92 (R)  ? RSD (reflex sympathetic dystrophy) 2007  ? R wrist/hand following fx  ? Stroke [facility])   ? TIA (transient ischemic attack)   ? Varicose veins   ? ? ?Patient Active Problem List  ? Diagnosis Date Noted  ? Bilateral impacted cerumen 02/22/2018  ? Conductive hearing loss, bilateral 02/22/2018  ? HTN (hypertension) 09/04/2017  ? GERD (gastroesophageal reflux disease) 09/04/2017  ? Diabetes mellitus type 2, insulin dependent ([place]) 09/04/2017  ? Anemia 09/04/2017  ? HLD (hyperlipidemia) 09/04/2017  ? CKD (chronic kidney disease) stage 3, GFR 30-59 ml/min ([facility]) 09/04/2017  ? Aphasia 09/01/2015  ? TIA (transient ischemic attack) 06/17/2015  ? Osteoporosis 02/18/2015  ? Normocytic anemia 12/13/2013  ? Diabetes mellitus type II, uncontrolled ([place]) 12/10/2013  ? Pneumonia 12/10/2013  ? Sepsis ([place]) 12/09/2013  ? Type 2  diabetes mellitus with insulin deficiency ([place])   ? Hypertension   ? Hypertriglyceridemia   ? Dyslipidemia   ? ? ?Past Surgical History:  ?Procedure Laterality Date  ? ABDOMINAL HYSTERECTOMY  1988  ? APPENDECTOMY  1966  ? BREAST CYST EXCISION    ? CATARACT EXTRACTION Bilateral 10/2009  ? CHOLECYSTECTOMY  1989  ? EYE SURGERY Bilateral   ? Cat Sx OU  ? Several benign cyst removed    ? last 1 in 1972  ? TUBAL LIGATION    ? UMBILICAL HERNIA REPAIR    ? ? ?OB History   ? ? Gravida  ?   ? Para  ?   ? Term  ?   ? Preterm  ?   ? AB  ?   ? Living  ?3  ?  ? ? SAB  ?   ? IAB  ?   ? Ectopic  ?   ? Multiple  ?   ? Live Births  ?   ?   ?  ?  ? ? ? ?Home Medications   ? ?Prior to Admission medications   ?Medication Sig Start Date End Date Taking? Authorizing Provider  ?molnupiravir EUA (LAGEVRIO) 200 mg CAPS capsule Take 4 capsules (800 mg total) by mouth 2 (two) times daily for 5 days. 11/07/21 11/12/21 Yes Volney American, PA-C  ?albuterol (VENTOLIN HFA) 108 (90 Base) MCG/ACT inhaler Inhale 1-2 puffs into the lungs in  the morning and at bedtime. 06/11/21   Maryjane Hurter, MD  ?Blood Glucose Monitoring Suppl (ONE TOUCH ULTRA SYSTEM KIT) W/DEVICE KIT 1 kit by Does not apply route once. Tests Blood sugar before meals and at bedtime 07/10/14   [name], MD  ?Calcium Carbonate-Vitamin D (CALTRATE 600+D PO) Take by mouth 2 (two) times daily.    [provider]  ?clopidogrel (PLAVIX) 75 MG tablet TAKE 1 TABLET BY MOUTH EVERY DAY 02/03/21   [name], MD  ?Coenzyme Q10 (COQ10 PO) Take by mouth.    [provider]  ?Continuous Blood Gluc Sensor (FREESTYLE LIBRE 14 DAY SENSOR) MISC USE EVERY 14 DAYS 10/30/21   [name], MD  ?denosumab (PROLIA) 60 MG/ML SOSY injection Inject into the skin every 6 (six) months.  10/21/17   [provider]  ?donepezil (ARICEPT) 10 MG tablet TAKE 1 TABLET BY MOUTH EVERYDAY AT BEDTIME 04/08/21   [name], MD  ?Dulaglutide (TRULICITY) 1.5 SH/7.88YO  SOPN INJECT 1.$RemoveBefor'5MG'cABHQGAEEzCK$  UNDER THE SKIN ONCE A WEEK 10/16/21   [name], MD  ?escitalopram (LEXAPRO) 10 MG tablet TAKE 1 TABLET BY MOUTH EVERY DAY 10/20/21   [name], MD  ?fenofibrate 160 MG tablet TAKE 1 TABLET BY MOUTH EVERY DAY 07/23/21   [name], MD  ?fluticasone (FLONASE) 50 MCG/ACT nasal spray SPRAY 2 SPRAYS INTO EACH NOSTRIL EVERY DAY 02/03/21   [name], MD  ?hydrochlorothiazide (MICROZIDE) 12.5 MG capsule Take 1 capsule (12.$RemoveBefor'5MG'nqlqdTUhYlBb$ ) by mouth daily. 07/10/21   [name], MD  ?icosapent Ethyl (VASCEPA) 1 g capsule TAKE 2 CAPSULES BY MOUTH 2 TIMES DAILY. 06/01/21   [name], MD  ?insulin lispro (HUMALOG) 100 UNIT/ML KwikPen Inject SQ QD as directed per sliding scale-  <60 call MD, 100-150 0U, 151-200 2U, 201- 250 4U, 251- 300 6U, 301- 350 8U, 351- 400 10U 401- 450 12U,  >450 15U call MD 06/03/21   [name], MD  ?Insulin Pen Needle (B-D ULTRAFINE III SHORT PEN) 31G X 8 MM MISC INJECT AS DIRECTED 4 TIMES DAILY 08/21/19   [name], MD  ?INSULIN SYRINGE 1CC/29G 29G X 1/2" 1 ML MISC AS DIRECTED. 07/09/15   Elayne Snare, MD  ?KLOR-CON M20 20 MEQ tablet TAKE 1 TABLET BY MOUTH EVERY DAY 06/30/21   [name], MD  ?losartan (COZAAR) 100 MG tablet Take 1 tablet (100 mg total) by mouth daily. 01/08/21   [name], MD  ?metoprolol succinate (TOPROL-XL) 50 MG 24 hr tablet TAKE 1 TABLET BY MOUTH EVERY DAY WITH OR IMMEDIATELY FOLLOWING A MEAL 04/08/21   [name], MD  ?Multiple Vitamin (MULTIVITAMIN) tablet Take 1 tablet by mouth daily.    [provider]  ?NONFORMULARY OR COMPOUNDED ITEM [facility]:  Peripheral Neuropathy Cream - Bupivacaine 1%, Doxepin 3%, Gabapentin 6%, Pentoxifylline 3%, Topiramate 1%, apply 1-2 grams to affected ares 3-4 times a day prn. ?Patient taking differently: [facility]:  Peripheral Neuropathy Cream - Bupivacaine 1%, Doxepin 3%, Gabapentin 6%, Pentoxifylline 3%, Topiramate 1%, apply 1-2 grams to  affected ares 3-4 times a day prn. 08/23/18   Marzetta Board, DPM  ?ONE TOUCH ULTRA TEST test strip USE FOUR TIMES DAILY BEFORE MEALS AND EVERY NIGHT AT BEDTIME 10/06/16   [name], MD  ?pantoprazole (PROTONIX) 40 MG tablet TAKE 1 TABLET BY MOUTH EVERY DAY 10/20/21   [name], MD  ?pioglitazone (ACTOS) 30 MG tablet TAKE 1 TABLET  BY MOUTH EVERY DAY 07/21/20   [name], MD  ?polyvinyl alcohol (LIQUIFILM TEARS) 1.4 % ophthalmic solution 1 drop as needed for dry eyes.    [provider]  ?Pyridoxine HCl (VITAMIN B-6) 100 MG tablet Take 100 mg by mouth daily.    [provider]  ?rosuvastatin (CRESTOR) 20 MG tablet Take 20 mg by mouth daily. 07/31/21   [provider]  ? ? ?Family History ?Family History  ?Problem Relation Age of Onset  ? Stroke Mother 43  ? Hypertension Mother   ? Clotting disorder Father   ? Heart attack Father   ? Arrhythmia Sister   ? Stroke Brother   ? Thyroid disease Neg Hx   ? ? ?Social History ?Social History  ? ?Tobacco Use  ? Smoking status: Never  ? Smokeless tobacco: Never  ?Vaping Use  ? Vaping Use: Never used  ?Substance Use Topics  ? Alcohol use: No  ? Drug use: No  ? ? ? ?Allergies   ?Lipitor [atorvastatin calcium], Niacin and related, Statins, and Macrobid [nitrofurantoin macrocrystal] ? ? ?Review of Systems ?Review of Systems ?Per HPI ? ?Physical Exam ?Triage Vital Signs ?ED Triage Vitals  ?Enc Vitals Group  ?   BP 11/07/21 1456 (!) 147/67  ?   Pulse Rate 11/07/21 1456 (!) 57  ?   Resp 11/07/21 1456 16  ?   Temp 11/07/21 1456 (!) 97.3 ?F (36.3 ?C)  ?   Temp Source 11/07/21 1456 Oral  ?   SpO2 11/07/21 1456 97 %  ?   Weight --   ?   Height --   ?   Head Circumference --   ?   Peak Flow --   ?   Pain Score 11/07/21 1454 0  ?   Pain Loc --   ?   Pain Edu? --   ?   Excl. in [place]? --   ? ?No data found. ? ?Updated Vital Signs ?BP (!) 147/67 (BP Location: Right Arm)   Pulse (!) 57   Temp (!) 97.3 ?F (36.3 ?C) (Oral)   Resp 16   SpO2 97%   ? ?Visual Acuity ?Right Eye Distance:   ?Left Eye Distance:   ?Bilateral Distance:   ? ?Right Eye Near:   ?Left Eye Near:    ?Bilateral Near:    ? ?Physical Exam ?Vitals and nursing note reviewed.  ?Constitutional:

## 2021-11-11 ENCOUNTER — Ambulatory Visit (INDEPENDENT_AMBULATORY_CARE_PROVIDER_SITE_OTHER): Payer: Medicare PPO

## 2021-11-11 ENCOUNTER — Other Ambulatory Visit: Payer: Self-pay

## 2021-11-11 DIAGNOSIS — M2041 Other hammer toe(s) (acquired), right foot: Secondary | ICD-10-CM

## 2021-11-11 DIAGNOSIS — E1142 Type 2 diabetes mellitus with diabetic polyneuropathy: Secondary | ICD-10-CM | POA: Diagnosis not present

## 2021-11-11 NOTE — Progress Notes (Signed)
SITUATION ?Reason for Visit: Fitting of Diabetic [place] ?Patient / Caregiver Report:  Patient is satisfied with fit and function of shoes and insoles. ? ?OBJECTIVE DATA: ?Patient History / Diagnosis:   ?  ICD-10-CM   ?1. Diabetic peripheral neuropathy associated with type 2 diabetes mellitus ([place])  E11.42   ?  ?2. Acquired hammertoes of both feet  M20.41   ? M20.42   ?  ? ? ?Change in Status:   None ? ?ACTIONS PERFORMED: ?In-Person Delivery, patient was fit with: ?- 1x pair [id number] PDAC approved prefabricated Diabetic Shoes: Patient wishes to return shoes and order men's black ?- 3x pair [id number] PDAC approved vacuum formed custom diabetic insoles; [employer]: [id number] ? ?Shoes and insoles were verified for structural integrity and safety. Patient wore shoes and insoles in office. Skin was inspected and free of areas of concern after wearing shoes and inserts. Shoes and inserts fit properly. Patient / Caregiver provided with ferbal instruction and demonstration regarding donning, doffing, wear, care, proper fit, function, purpose, cleaning, and use of shoes and insoles ' and in all related precautions and risks and benefits regarding shoes and insoles. Patient / Caregiver was instructed to wear properly fitting socks with shoes at all times. Patient was also provided with verbal instruction regarding how to report any failures or malfunctions of shoes or inserts, and necessary follow up care. Patient / Caregiver was also instructed to contact physician regarding change in status that may affect function of shoes and inserts.  ? ?Patient / Caregiver verbalized undersatnding of instruction provided. Patient / Caregiver demonstrated independence with proper donning and doffing of shoes and inserts. ? ?PLAN ?Patient to follow with treating physician as recommended. Plan of care was discussed with and agreed upon by patient and/or caregiver. All questions were answered and concerns addressed. ? ?

## 2021-11-20 ENCOUNTER — Telehealth: Payer: Self-pay

## 2021-11-20 DIAGNOSIS — R059 Cough, unspecified: Secondary | ICD-10-CM

## 2021-11-20 DIAGNOSIS — M7989 Other specified soft tissue disorders: Secondary | ICD-10-CM

## 2021-11-20 MED ORDER — FLUTICASONE PROPIONATE 50 MCG/ACT NA SUSP: NASAL | 2 refills | Status: AC

## 2021-11-20 NOTE — Telephone Encounter (Signed)
Rx sent to pharmacy   

## 2021-11-20 NOTE — Telephone Encounter (Signed)
Pharmacy faxed a refill request for  ? ?fluticasone (FLONASE) 50 MCG/ACT nasal spray [[id number]]  ?  Order Details ?Dose, Route, Frequency: As Directed  ?Dispense Quantity: 48 mL Refills: 2   ?     ?Sig: SPRAY 2 SPRAYS INTO EACH NOSTRIL EVERY DAY  ?     ?Start Date: 02/03/21 End Date: --  ?Written Date: 02/03/21 Expiration Date: 02/03/22  ?   ?Diagnosis Association: Cough (R05.9); Leg swelling (M79.89)  ?Original Order:  fluticasone (FLONASE) 50 MCG/ACT nasal spray [[id number]]  ? ?

## 2021-11-25 ENCOUNTER — Ambulatory Visit
Admission: EM | Admit: 2021-11-25 | Discharge: 2021-11-25 | Disposition: A | Discharge status: Home / Self Care | Payer: Medicare PPO

## 2021-11-25 ENCOUNTER — Telehealth: Payer: Self-pay

## 2021-11-25 DIAGNOSIS — S8011XA Contusion of right lower leg, initial encounter: Secondary | ICD-10-CM | POA: Diagnosis not present

## 2021-11-25 DIAGNOSIS — W19XXXA Unspecified fall, initial encounter: Secondary | ICD-10-CM

## 2021-11-25 DIAGNOSIS — Z7901 Long term (current) use of anticoagulants: Secondary | ICD-10-CM

## 2021-11-25 NOTE — ED Triage Notes (Signed)
Pt's daughter states that on Tuesday she fell and they noticed a bruise on her right thigh that circles the thigh ? ?Pt has been complaining of her right leg hurting since she fell ?

## 2021-11-25 NOTE — Telephone Encounter (Signed)
Pt's daughter called this morning stating that within the last few months pt had been falling a lot. Daughter said that last week, pt fell and but wasn't c/o anything due to that pt was still able to move around. However, daughter has noticed that pt has been c/o of r-hip/leg pain. Per daughter last night at around 11pm while putting pt to bed she noticed a big bruise on pt's r-thigh above the knee.  This morning daughter took a better look at the bruised and noted that it is a significant size bruised around the thigh/back and all the way around up to above the knee. Daughter believe that it measures around 6 to 7 inches.  ? ?Advice to go ahead and take pt to urgent care to get evaluated today since pt is starting c/o of pain. She can address any other concerns she may have at that time with the urgent care provider. Daughter voiced understanding and will go to urgent care today.  ?

## 2021-11-25 NOTE — ED Provider Notes (Signed)
?[facility] ? ? ? ?CSN: [id number] ?Arrival date & time: 11/25/21  1043 ? ? ?  ? ?History   ?Chief Complaint ?Chief Complaint  ?Patient presents with  ? bruise on thigh  ? ? ?HPI ?Rebecca Perkins is a 88 y.o. female.  ? ?Presenting today with her daughter who provides all of the history.  Daughter states that she had a fall on Tuesday, unclear details of the fall as it was not witnessed.  Progressively worsening bruising to the right upper leg medially, posteriorly and complaints of leg pain, weakness.  Denies numbness, tingling, loss of range of motion.  Family states that she has been falling often recently.  Takes Plavix daily.  Not trying anything over-the-counter for symptoms. ? ? ?Past Medical History:  ?Diagnosis Date  ? Anemia   ? Diabetes mellitus type 2, insulin dependent ([place])   ? Diabetic retinopathy ([place])   ? NPDR OU  ? Dyslipidemia   ? GERD (gastroesophageal reflux disease)   ? Hypertension   ? Hypertensive retinopathy   ? OU  ? Migraines   ? Osteoporosis   ? Pneumonia   ? PVD (peripheral vascular disease) ([place])   ? abi .83 (L), .92 (R)  ? RSD (reflex sympathetic dystrophy) 2007  ? R wrist/hand following fx  ? Stroke [facility])   ? TIA (transient ischemic attack)   ? Varicose veins   ? ? ?Patient Active Problem List  ? Diagnosis Date Noted  ? Bilateral impacted cerumen 02/22/2018  ? Conductive hearing loss, bilateral 02/22/2018  ? HTN (hypertension) 09/04/2017  ? GERD (gastroesophageal reflux disease) 09/04/2017  ? Diabetes mellitus type 2, insulin dependent ([place]) 09/04/2017  ? Anemia 09/04/2017  ? HLD (hyperlipidemia) 09/04/2017  ? CKD (chronic kidney disease) stage 3, GFR 30-59 ml/min (HCC) 09/04/2017  ? Aphasia 09/01/2015  ? TIA (transient ischemic attack) 06/17/2015  ? Osteoporosis 02/18/2015  ? Normocytic anemia 12/13/2013  ? Diabetes mellitus type II, uncontrolled ([place]) 12/10/2013  ? Pneumonia 12/10/2013  ? Sepsis ([place]) 12/09/2013  ? Type 2 diabetes mellitus with insulin deficiency ([place])    ? Hypertension   ? Hypertriglyceridemia   ? Dyslipidemia   ? ? ?Past Surgical History:  ?Procedure Laterality Date  ? ABDOMINAL HYSTERECTOMY  1988  ? APPENDECTOMY  1966  ? BREAST CYST EXCISION    ? CATARACT EXTRACTION Bilateral 10/2009  ? CHOLECYSTECTOMY  1989  ? EYE SURGERY Bilateral   ? Cat Sx OU  ? Several benign cyst removed    ? last 1 in 1972  ? TUBAL LIGATION    ? UMBILICAL HERNIA REPAIR    ? ? ?OB History   ? ? Gravida  ?   ? Para  ?   ? Term  ?   ? Preterm  ?   ? AB  ?   ? Living  ?3  ?  ? ? SAB  ?   ? IAB  ?   ? Ectopic  ?   ? Multiple  ?   ? Live Births  ?   ?   ?  ?  ? ? ? ?Home Medications   ? ?Prior to Admission medications   ?Medication Sig Start Date End Date Taking? Authorizing Provider  ?albuterol (VENTOLIN HFA) 108 (90 Base) MCG/ACT inhaler Inhale 1-2 puffs into the lungs in the morning and at bedtime. 06/11/21   Maryjane Hurter, MD  ?Blood Glucose Monitoring Suppl (ONE TOUCH ULTRA SYSTEM KIT) W/DEVICE KIT 1 kit by Does not  apply route once. Tests Blood sugar before meals and at bedtime 07/10/14   [name], MD  ?Calcium Carbonate-Vitamin D (CALTRATE 600+D PO) Take by mouth 2 (two) times daily.    [provider]  ?clopidogrel (PLAVIX) 75 MG tablet TAKE 1 TABLET BY MOUTH EVERY DAY 02/03/21   [name], MD  ?Coenzyme Q10 (COQ10 PO) Take by mouth.    [provider]  ?Continuous Blood Gluc Sensor (FREESTYLE LIBRE 14 DAY SENSOR) MISC USE EVERY 14 DAYS 10/30/21   [name], MD  ?denosumab (PROLIA) 60 MG/ML SOSY injection Inject into the skin every 6 (six) months.  10/21/17   [provider]  ?donepezil (ARICEPT) 10 MG tablet TAKE 1 TABLET BY MOUTH EVERYDAY AT BEDTIME 04/08/21   [name], MD  ?Dulaglutide (TRULICITY) 1.5 JO/8.7OM SOPN INJECT 1.5MG UNDER THE SKIN ONCE A WEEK 10/16/21   [name], MD  ?escitalopram (LEXAPRO) 10 MG tablet TAKE 1 TABLET BY MOUTH EVERY DAY 10/20/21   [name], MD  ?fenofibrate 160 MG tablet TAKE 1 TABLET  BY MOUTH EVERY DAY 07/23/21   [name], MD  ?fluticasone (FLONASE) 50 MCG/ACT nasal spray SPRAY 2 SPRAYS INTO EACH NOSTRIL EVERY DAY 11/20/21   [name], MD  ?hydrochlorothiazide (MICROZIDE) 12.5 MG capsule Take 1 capsule (12.5MG) by mouth daily. 07/10/21   [name], MD  ?icosapent Ethyl (VASCEPA) 1 g capsule TAKE 2 CAPSULES BY MOUTH 2 TIMES DAILY. 06/01/21   [name], MD  ?insulin lispro (HUMALOG) 100 UNIT/ML KwikPen Inject SQ QD as directed per sliding scale-  <60 call MD, 100-150 0U, 151-200 2U, 201- 250 4U, 251- 300 6U, 301- 350 8U, 351- 400 10U 401- 450 12U,  >450 15U call MD 06/03/21   [name], MD  ?Insulin Pen Needle (B-D ULTRAFINE III SHORT PEN) 31G X 8 MM MISC INJECT AS DIRECTED 4 TIMES DAILY 08/21/19   [name], MD  ?INSULIN SYRINGE 1CC/29G 29G X 1/2" 1 ML MISC AS DIRECTED. 07/09/15   Elayne Snare, MD  ?KLOR-CON M20 20 MEQ tablet TAKE 1 TABLET BY MOUTH EVERY DAY 06/30/21   [name], MD  ?losartan (COZAAR) 100 MG tablet Take 1 tablet (100 mg total) by mouth daily. 01/08/21   [name], MD  ?metoprolol succinate (TOPROL-XL) 50 MG 24 hr tablet TAKE 1 TABLET BY MOUTH EVERY DAY WITH OR IMMEDIATELY FOLLOWING A MEAL 04/08/21   [name], MD  ?Multiple Vitamin (MULTIVITAMIN) tablet Take 1 tablet by mouth daily.    [provider]  ?NONFORMULARY OR COMPOUNDED ITEM [facility]:  Peripheral Neuropathy Cream - Bupivacaine 1%, Doxepin 3%, Gabapentin 6%, Pentoxifylline 3%, Topiramate 1%, apply 1-2 grams to affected ares 3-4 times a day prn. ?Patient taking differently: [facility]:  Peripheral Neuropathy Cream - Bupivacaine 1%, Doxepin 3%, Gabapentin 6%, Pentoxifylline 3%, Topiramate 1%, apply 1-2 grams to affected ares 3-4 times a day prn. 08/23/18   Marzetta Board, DPM  ?ONE TOUCH ULTRA TEST test strip USE FOUR TIMES DAILY BEFORE MEALS AND EVERY NIGHT AT BEDTIME 10/06/16   [name], MD  ?pantoprazole (PROTONIX) 40  MG tablet TAKE 1 TABLET BY MOUTH EVERY DAY 10/20/21   [name], MD  ?pioglitazone (ACTOS) 30 MG tablet TAKE 1 TABLET BY MOUTH EVERY DAY 07/21/20   [name], MD  ?polyvinyl alcohol (LIQUIFILM TEARS) 1.4 % ophthalmic solution 1 drop as needed for dry eyes.  [provider]  ?Pyridoxine HCl (VITAMIN B-6) 100 MG tablet Take 100 mg by mouth daily.    [provider]  ?rosuvastatin (CRESTOR) 20 MG tablet Take 20 mg by mouth daily. 07/31/21   [provider]  ? ? ?Family History ?Family History  ?Problem Relation Age of Onset  ? Stroke Mother 11  ? Hypertension Mother   ? Clotting disorder Father   ? Heart attack Father   ? Arrhythmia Sister   ? Stroke Brother   ? Thyroid disease Neg Hx   ? ? ?Social History ?Social History  ? ?Tobacco Use  ? Smoking status: Never  ? Smokeless tobacco: Never  ?Vaping Use  ? Vaping Use: Never used  ?Substance Use Topics  ? Alcohol use: No  ? Drug use: No  ? ? ? ?Allergies   ?Lipitor [atorvastatin calcium], Niacin and related, Statins, and Macrobid [nitrofurantoin macrocrystal] ? ? ?Review of Systems ?Review of Systems ?Per HPI ? ?Physical Exam ?Triage Vital Signs ?ED Triage Vitals  ?Enc Vitals Group  ?   BP 11/25/21 1050 102/65  ?   Pulse Rate 11/25/21 1050 (!) 113  ?   Resp 11/25/21 1050 16  ?   Temp 11/25/21 1050 98.2 ?F (36.8 ?C)  ?   Temp Source 11/25/21 1050 Tympanic  ?   SpO2 11/25/21 1050 91 %  ?   Weight 11/25/21 1051 114 lb (51.7 kg)  ?   Height --   ?   Head Circumference --   ?   Peak Flow --   ?   Pain Score 11/25/21 1051 8  ?   Pain Loc --   ?   Pain Edu? --   ?   Excl. in [place]? --   ? ?No data found. ? ?Updated Vital Signs ?BP 102/65 (BP Location: Left Arm)   Pulse (!) 113   Temp 98.2 ?F (36.8 ?C) (Tympanic)   Resp 16   Wt 114 lb (51.7 kg)   SpO2 91%   BMI 19.57 kg/m?  ? ?Visual Acuity ?Right Eye Distance:   ?Left Eye Distance:   ?Bilateral Distance:   ? ?Right Eye Near:   ?Left Eye Near:    ?Bilateral Near:    ? ?Physical  Exam ?Vitals and nursing note reviewed.  ?Constitutional:   ?   Appearance: She is not ill-appearing.  ?Eyes:  ?   Extraocular Movements: Extraocular movements intact.  ?   Conjunctiva/sclera: Conjunctivae no

## 2021-11-25 NOTE — Discharge Instructions (Signed)
Continue to monitor the area, follow-up with primary care soon as able for recheck ?

## 2021-11-26 ENCOUNTER — Telehealth: Payer: Self-pay

## 2021-11-26 NOTE — Telephone Encounter (Signed)
Rebecca Perkins w/[facility],  ? ?Pt's family is requesting that you be pt's attending physcians? ? ?Please advice ? ?

## 2021-11-27 ENCOUNTER — Ambulatory Visit: Payer: Medicare PPO | Admitting: Family Medicine

## 2021-11-27 VITALS — Temp 97.6°F | Ht 64.0 in

## 2021-11-27 DIAGNOSIS — M25551 Pain in right hip: Secondary | ICD-10-CM

## 2021-11-27 DIAGNOSIS — N390 Urinary tract infection, site not specified: Secondary | ICD-10-CM

## 2021-11-27 LAB — URINALYSIS, ROUTINE W REFLEX MICROSCOPIC
Bilirubin Urine: NEGATIVE
Crystals: NONE SEEN /HPF
Hgb urine dipstick: NEGATIVE
Nitrite: POSITIVE — AB
Protein, ur: NEGATIVE
RBC / HPF: NONE SEEN /HPF (ref 0–2)
Specific Gravity, Urine: 1.015 (ref 1.001–1.035)
Yeast: NONE SEEN /HPF
pH: 5 (ref 5.0–8.0)

## 2021-11-27 LAB — MICROSCOPIC MESSAGE

## 2021-11-27 MED ORDER — SULFAMETHOXAZOLE-TRIMETHOPRIM 800-160 MG PO TABS: 1.0000 | ORAL_TABLET | Freq: Two times a day (BID) | ORAL | 0 refills | Status: AC

## 2021-11-27 NOTE — Progress Notes (Signed)
? ?Subjective:  ? ? Patient ID: Rebecca Perkins, female    DOB: Nov 23, 1928, 88 y.o.   MRN: [id number] ?Unfortunately, the patient's dementia continues to progress.  She is here today with her daughter and her son.  Over the last week they have noticed a worsening with increasing confusion and delirium.  She is falling more often.  They deny any fever.  They deny any coughing.  Urinalysis today shows +1 leukocyte esterase and positive nitrates.  There is concern about possible delirium due to a urinary tract infection.  She also has a significant bruise over her sacrum and the medial portion of her upper right thigh.  With standing she seems to be having pain in her right hip.  They are not sure when she fell.  The bruise is purple with a yellow border suggesting that it is an older injury.  Flexion of the hip does not cause pain however she does have pain with weightbearing. ?Past Medical History:  ?Diagnosis Date  ? Anemia   ? Diabetes mellitus type 2, insulin dependent ([place])   ? Diabetic retinopathy ([place])   ? NPDR OU  ? Dyslipidemia   ? GERD (gastroesophageal reflux disease)   ? Hypertension   ? Hypertensive retinopathy   ? OU  ? Migraines   ? Osteoporosis   ? Pneumonia   ? PVD (peripheral vascular disease) ([place])   ? abi .83 (L), .92 (R)  ? RSD (reflex sympathetic dystrophy) 2007  ? R wrist/hand following fx  ? Stroke [facility])   ? TIA (transient ischemic attack)   ? Varicose veins   ? ?Past Surgical History:  ?Procedure Laterality Date  ? ABDOMINAL HYSTERECTOMY  1988  ? APPENDECTOMY  1966  ? BREAST CYST EXCISION    ? CATARACT EXTRACTION Bilateral 10/2009  ? CHOLECYSTECTOMY  1989  ? EYE SURGERY Bilateral   ? Cat Sx OU  ? Several benign cyst removed    ? last 1 in 1972  ? TUBAL LIGATION    ? UMBILICAL HERNIA REPAIR    ? ?Current Outpatient Medications on File Prior to Visit  ?Medication Sig Dispense Refill  ? albuterol (VENTOLIN HFA) 108 (90 Base) MCG/ACT inhaler Inhale 1-2 puffs into the lungs in the morning and at  bedtime. 8 g 11  ? Blood Glucose Monitoring Suppl (ONE TOUCH ULTRA SYSTEM KIT) W/DEVICE KIT 1 kit by Does not apply route once. Tests Blood sugar before meals and at bedtime 1 each 0  ? Calcium Carbonate-Vitamin D (CALTRATE 600+D PO) Take by mouth 2 (two) times daily.    ? clopidogrel (PLAVIX) 75 MG tablet TAKE 1 TABLET BY MOUTH EVERY DAY 90 tablet 3  ? Coenzyme Q10 (COQ10 PO) Take by mouth.    ? Continuous Blood Gluc Sensor (FREESTYLE LIBRE 14 DAY SENSOR) MISC USE EVERY 14 DAYS 6 each 3  ? denosumab (PROLIA) 60 MG/ML SOSY injection Inject into the skin every 6 (six) months.     ? donepezil (ARICEPT) 10 MG tablet TAKE 1 TABLET BY MOUTH EVERYDAY AT BEDTIME 90 tablet 3  ? Dulaglutide (TRULICITY) 1.5 GB/2.0FE SOPN INJECT 1.$RemoveBefor'5MG'nvHLUqFMZtar$  UNDER THE SKIN ONCE A WEEK 2 mL 3  ? escitalopram (LEXAPRO) 10 MG tablet TAKE 1 TABLET BY MOUTH EVERY DAY 90 tablet 1  ? fenofibrate 160 MG tablet TAKE 1 TABLET BY MOUTH EVERY DAY 90 tablet 3  ? fluticasone (FLONASE) 50 MCG/ACT nasal spray SPRAY 2 SPRAYS INTO EACH NOSTRIL EVERY DAY 48 mL 2  ? hydrochlorothiazide (MICROZIDE)  12.5 MG capsule Take 1 capsule (12.$RemoveBefor'5MG'IeLXVnGwSEtb$ ) by mouth daily. 90 capsule 1  ? icosapent Ethyl (VASCEPA) 1 g capsule TAKE 2 CAPSULES BY MOUTH 2 TIMES DAILY. 120 capsule 11  ? insulin lispro (HUMALOG) 100 UNIT/ML KwikPen Inject SQ QD as directed per sliding scale-  <60 call MD, 100-150 0U, 151-200 2U, 201- 250 4U, 251- 300 6U, 301- 350 8U, 351- 400 10U 401- 450 12U,  >450 15U call MD 15 mL 3  ? Insulin Pen Needle (B-D ULTRAFINE III SHORT PEN) 31G X 8 MM MISC INJECT AS DIRECTED 4 TIMES DAILY 300 each 2  ? INSULIN SYRINGE 1CC/29G 29G X 1/2" 1 ML MISC AS DIRECTED. 100 each 3  ? KLOR-CON M20 20 MEQ tablet TAKE 1 TABLET BY MOUTH EVERY DAY 90 tablet 2  ? losartan (COZAAR) 100 MG tablet Take 1 tablet (100 mg total) by mouth daily. 90 tablet 3  ? metoprolol succinate (TOPROL-XL) 50 MG 24 hr tablet TAKE 1 TABLET BY MOUTH EVERY DAY WITH OR IMMEDIATELY FOLLOWING A MEAL 90 tablet 1  ? Multiple  Vitamin (MULTIVITAMIN) tablet Take 1 tablet by mouth daily.    ? NONFORMULARY OR COMPOUNDED ITEM [facility]:  Peripheral Neuropathy Cream - Bupivacaine 1%, Doxepin 3%, Gabapentin 6%, Pentoxifylline 3%, Topiramate 1%, apply 1-2 grams to affected ares 3-4 times a day prn. (Patient taking differently: [facility]:  Peripheral Neuropathy Cream - Bupivacaine 1%, Doxepin 3%, Gabapentin 6%, Pentoxifylline 3%, Topiramate 1%, apply 1-2 grams to affected ares 3-4 times a day prn.) 100 each 5  ? ONE TOUCH ULTRA TEST test strip USE FOUR TIMES DAILY BEFORE MEALS AND EVERY NIGHT AT BEDTIME 150 each 5  ? pantoprazole (PROTONIX) 40 MG tablet TAKE 1 TABLET BY MOUTH EVERY DAY 90 tablet 1  ? pioglitazone (ACTOS) 30 MG tablet TAKE 1 TABLET BY MOUTH EVERY DAY 90 tablet 3  ? polyvinyl alcohol (LIQUIFILM TEARS) 1.4 % ophthalmic solution 1 drop as needed for dry eyes.    ? Pyridoxine HCl (VITAMIN B-6) 100 MG tablet Take 100 mg by mouth daily.    ? rosuvastatin (CRESTOR) 20 MG tablet Take 20 mg by mouth daily.    ? ?Current Facility-Administered Medications on File Prior to Visit  ?Medication Dose Route Frequency Provider Last Rate Last Admin  ? denosumab (PROLIA) injection 60 mg  60 mg Subcutaneous Q6 months [name], MD   60 mg at 11/04/20 1409  ? ?Allergies  ?Allergen Reactions  ? Lipitor [Atorvastatin Calcium] Diarrhea  ? Niacin And Related Hives  ? Statins Diarrhea  ? Macrobid [Nitrofurantoin Macrocrystal] Other (See Comments)  ?  unknown  ? ?Social History  ? ?Socioeconomic History  ? Marital status: Widowed  ?  Spouse name: Not on file  ? Number of children: 3  ? Years of education: Not on file  ? Highest education level: Not on file  ?Occupational History  ? Occupation: Retired  ?Tobacco Use  ? Smoking status: Never  ? Smokeless tobacco: Never  ?Vaping Use  ? Vaping Use: Never used  ?Substance and Sexual Activity  ? Alcohol use: No  ? Drug use: No  ? Sexual activity: Not Currently  ?Other Topics Concern  ?  Not on file  ?Social History Narrative  ? 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 Married , lives with spouse of 76 y (03/2011)  ? ?Social Determinants of Health  ? ?Financial Resource Strain: Not on file  ?Food Insecurity: Not on file  ?  Transportation Needs: Not on file  ?Physical Activity: Not on file  ?Stress: Not on file  ?Social Connections: Not on file  ?Intimate Partner Violence: Not on file  ? ? ? ?Review of Systems  ?All other systems reviewed and are negative. ? ?   ?Objective:  ? Physical Exam ?Vitals reviewed.  ?Constitutional:   ?   General: She is not in acute distress. ?   Appearance: Normal appearance. She is well-developed. She is obese. She is not ill-appearing, toxic-appearing or diaphoretic.  ?HENT:  ?   Head: Normocephalic.  ?Eyes:  ?   General: No visual field deficit. ?Neck:  ?   Vascular: No JVD.  ?Cardiovascular:  ?   Rate and Rhythm: Normal rate and regular rhythm.  ?   Heart sounds: Normal heart sounds. No murmur heard. ?  No friction rub. No gallop.  ?Pulmonary:  ?   Effort: Pulmonary effort is normal. No respiratory distress.  ?   Breath sounds: No stridor. No wheezing or rhonchi.  ?Musculoskeletal:  ?   Right hip: Tenderness present. Decreased range of motion.  ?   Right lower leg: Tenderness present. No edema.  ?   Left lower leg: No edema.  ?     Legs: ? ?Skin: ?   Findings: No erythema.  ?Neurological:  ?   General: No focal deficit present.  ?   Mental Status: She is alert. Mental status is at baseline.  ?   Cranial Nerves: No cranial nerve deficit, dysarthria or facial asymmetry.  ?   Sensory: Sensation is intact. No sensory deficit.  ?   Motor: Motor function is intact. No weakness, abnormal muscle tone or seizure activity.  ?   Coordination: Coordination is intact. Coordination normal.  ? ? ? ? ? ?   ?Assessment & Plan:  ?Right hip pain - Plan: DG Hip Unilat W OR W/O Pelvis 2-3 Views Right ? ?Urinary tract infection without hematuria,  site unspecified - Plan: Urinalysis, Routine w reflex microscopic, Urinalysis, Routine w reflex microscopic ?Patient appears to have a urinary tract infection.  Begin Bactrim double strength tablets twice d

## 2021-11-30 ENCOUNTER — Ambulatory Visit
Admission: RE | Admit: 2021-11-30 | Discharge: 2021-11-30 | Disposition: A | Discharge status: Home / Self Care | Payer: Medicare PPO | Source: Ambulatory Visit | Attending: Family Medicine | Admitting: Family Medicine

## 2021-11-30 DIAGNOSIS — M1611 Unilateral primary osteoarthritis, right hip: Secondary | ICD-10-CM | POA: Diagnosis not present

## 2021-11-30 DIAGNOSIS — S7001XA Contusion of right hip, initial encounter: Secondary | ICD-10-CM | POA: Diagnosis not present

## 2021-11-30 DIAGNOSIS — I739 Peripheral vascular disease, unspecified: Secondary | ICD-10-CM | POA: Diagnosis not present

## 2021-11-30 DIAGNOSIS — M25551 Pain in right hip: Secondary | ICD-10-CM

## 2021-12-04 ENCOUNTER — Telehealth: Payer: Self-pay

## 2021-12-04 NOTE — Telephone Encounter (Signed)
Med list in Dr. Dennard Schaumann folder. Will call as soon as review by Dr. Dennard Schaumann.  ?

## 2021-12-04 NOTE — Telephone Encounter (Signed)
Pt's daughter came into to office stating that pt is now under the care of Hospice. Hospice has asked that pcp review pt's meds to see which meds can be discontinued. Please advise. ? ?Cb#:[phone] ?

## 2021-12-07 ENCOUNTER — Telehealth: Payer: Self-pay

## 2021-12-07 ENCOUNTER — Encounter: Payer: Self-pay | Admitting: Family Medicine

## 2021-12-07 ENCOUNTER — Other Ambulatory Visit: Payer: Self-pay | Admitting: Family Medicine

## 2021-12-07 ENCOUNTER — Other Ambulatory Visit: Payer: Self-pay

## 2021-12-07 MED ORDER — LEVETIRACETAM 500 MG PO TABS: 500.0000 mg | ORAL_TABLET | Freq: Two times a day (BID) | ORAL | 3 refills | Status: DC

## 2021-12-07 NOTE — Progress Notes (Signed)
Spoke with daughter, Juliann Pulse via telephone.  Over the last week or so the patient started having seizures.  Initially they were absent seizures characterized by staring blankly and moments of unresponsiveness lasting seconds to a minute.  Recently she started having tonic-clonic seizures where her eyes were rolled back in her head and she would have uncontrolled shaking of her arms and legs.  These would last less than 30 seconds.  She has not fallen or hit her head.  She is not complaining of any headaches.  Times in between seizures, the patient was in her normal state of health.  However there has been decline considerably over the last few months and she now has end-stage dementia and is on hospice.  Therefore we had a discussion regarding quality of life and comfort care measures.  Together we have decided to stop medications that are no longer necessary.  We have ultimately decided to continue clopidogrel to prevent strokes, continue Trulicity and Actos because they are keeping her sugars in the 200 range and without the medication she will be greater than 500 and likely in the hospital and to start Keppra 500 mg twice daily for seizures.  We discussed whether we want to work-up  seizures including MRI or CT scan to rule out intracranial hemorrhage, stroke, space-occupying lesion etc.  Family has decided against any work-up.  They realize that the patient's time is short and they want to focus on comfort.  Therefore we will use the medication Keppra twice daily for seizure prophylaxis and monitor for the patient's response. ?

## 2021-12-07 NOTE — Telephone Encounter (Signed)
Saw Briain Bear -Nursew/[facility], per nurse just stop in from seeing the pt this morning. Nurse stated that pt's daughter wanted you to know that within the last few days, pt has been experiencing Focal seizures.  Where pt just stop with what she is doing and just looks around aimlessly all the while her eyes will roll back. Pt nurse pt has also beein c/o hip pain. Pt was give Tramadol '50mg'$  Q6 prn, pt only had two dose so far.   ? ?Daughter wants to know if you would like to prescribe pt something for her Focal seizures? ? ?Pls advice.   ?

## 2021-12-07 NOTE — Telephone Encounter (Signed)
Call Nurse Aaron Edelman w/[facility] and pt's daughter-Cathy. RX call into pharmacy per Dr. Dennard Schaumann. Per nurse/daughter, pt is not hypoglycemic.  ? ?  ?

## 2021-12-17 ENCOUNTER — Other Ambulatory Visit: Payer: Self-pay | Admitting: Family Medicine

## 2021-12-17 NOTE — Telephone Encounter (Signed)
Requested Prescriptions  Pending Prescriptions Disp Refills  . metoprolol succinate (TOPROL-XL) 50 MG 24 hr tablet [Pharmacy Med Name: METOPROLOL SUCC ER 50 MG TAB] 90 tablet 1    Sig: TAKE 1 TABLET BY MOUTH EVERY DAY WITH OR IMMEDIATELY FOLLOWING A MEAL     Cardiovascular:  Beta Blockers Failed - 12/17/2021  2:22 AM      Failed - Last Heart Rate in normal range    Pulse Readings from Last 1 Encounters:  11/25/21 (!) 113         Passed - Last BP in normal range    BP Readings from Last 1 Encounters:  11/25/21 102/65         Passed - Valid encounter within last 6 months    Recent Outpatient Visits          2 weeks ago Right hip pain   [place] [name], MD   1 month ago Contusion of head, unspecified part of head, initial encounter   [place] [name], MD   2 months ago Memory loss   [place], Warren T, MD   6 months ago Interstitial lung disorders [facility])   [place] Dennard Schaumann, Cammie Mcgee, MD   6 months ago Acute cough   [place] Pickard, Cammie Mcgee, MD

## 2021-12-25 ENCOUNTER — Other Ambulatory Visit: Payer: Self-pay | Admitting: Family Medicine

## 2021-12-29 ENCOUNTER — Ambulatory Visit: Payer: Medicare PPO | Admitting: Podiatry

## 2021-12-29 ENCOUNTER — Encounter: Payer: Self-pay | Admitting: Podiatry

## 2021-12-29 ENCOUNTER — Ambulatory Visit: Payer: Medicare PPO

## 2021-12-29 DIAGNOSIS — M79675 Pain in left toe(s): Secondary | ICD-10-CM | POA: Diagnosis not present

## 2021-12-29 DIAGNOSIS — M79674 Pain in right toe(s): Secondary | ICD-10-CM | POA: Diagnosis not present

## 2021-12-29 DIAGNOSIS — E1142 Type 2 diabetes mellitus with diabetic polyneuropathy: Secondary | ICD-10-CM | POA: Diagnosis not present

## 2021-12-29 DIAGNOSIS — B351 Tinea unguium: Secondary | ICD-10-CM

## 2021-12-29 DIAGNOSIS — M2041 Other hammer toe(s) (acquired), right foot: Secondary | ICD-10-CM

## 2021-12-29 NOTE — Progress Notes (Signed)
Patient seen for fitting of exchanged shoes. Fit patient with Apex sneaker in men's black, patient is satisfied with fit and function. All questions answered and concerns addressed.

## 2021-12-29 NOTE — Telephone Encounter (Signed)
Requested medications are due for refill today.  yes  Requested medications are on the active medications list.  yes  Last refill. 01/08/2021 #90 3 refills  Future visit scheduled.   no  Notes to clinic.  Medication refill failed protocol d/t expired labs.    Requested Prescriptions  Pending Prescriptions Disp Refills   losartan (COZAAR) 100 MG tablet [Pharmacy Med Name: LOSARTAN POTASSIUM 100 MG TAB] 90 tablet 3    Sig: TAKE 1 TABLET BY MOUTH EVERY DAY     Cardiovascular:  Angiotensin Receptor Blockers Failed - 12/25/2021  1:54 AM      Failed - Cr in normal range and within 180 days    Creat  Date Value Ref Range Status  03/27/2021 1.13 (H) 0.60 - 0.95 mg/dL Final   Creatinine, Ser  Date Value Ref Range Status  06/10/2021 1.08 (H) 0.44 - 1.00 mg/dL Final   Creatinine,U  Date Value Ref Range Status  06/06/2018 75.0 mg/dL Final         Failed - K in normal range and within 180 days    Potassium  Date Value Ref Range Status  06/10/2021 4.3 3.5 - 5.1 mmol/L Final         Passed - Patient is not pregnant      Passed - Last BP in normal range    BP Readings from Last 1 Encounters:  11/25/21 102/65         Passed - Valid encounter within last 6 months    Recent Outpatient Visits           1 month ago Right hip pain   [place] [name], MD   2 months ago Contusion of head, unspecified part of head, initial encounter   [place] [name], MD   3 months ago Memory loss   [place], Warren T, MD   6 months ago Interstitial lung disorders [facility])   [place] Pickard, Cammie Mcgee, MD   7 months ago Acute cough   [place] Pickard, Cammie Mcgee, MD

## 2022-01-02 NOTE — Progress Notes (Signed)
  Subjective:  Patient ID: Rebecca Perkins, female    DOB: 1929/05/20,  MRN: [id number]  Rebecca Perkins presents to clinic today for at risk foot care with history of diabetic neuropathy and painful elongated mycotic toenails 1-5 bilaterally which are tender when wearing enclosed shoe gear. Pain is relieved with periodic professional debridement.  Patient states blood glucose was 134 mg/dl today.  Last known HgA1c was unknown.  Patient is accompanied by her daughter and caregiver on today's visit. She is picking up her diabetic shoes on today as well.  New problem(s): None.   She was seen in the ED for a fall in April and has followed up with Dr. Dennard Schaumann after that visit. PCP is [name], MD , and last visit was November 27, 2021.  Allergies  Allergen Reactions   Lipitor [Atorvastatin Calcium] Diarrhea   Niacin And Related Hives   Statins Diarrhea   Macrobid [Nitrofurantoin Macrocrystal] Other (See Comments)    unknown    Review of Systems: Negative except as noted in the HPI.  Objective: No changes noted in today's physical examination. There were no vitals filed for this visit.  Rebecca Perkins is a pleasant 88 y.o. female in NAD. AAO X 3.  Vascular Examination: CFT <3 seconds b/l LE. Palpable pedal pulses b/l LE. Pedal hair absent. No pain with calf compression b/l. Lower extremity skin temperature gradient within normal limits. Patient wearing compression hose on today's visit. No edema noted b/l LE. No ischemia or gangrene noted b/l LE. No cyanosis or clubbing noted b/l LE.  Dermatological Examination: No interdigital macerations noted b/l LE. Toenails 1-5 bilaterally elongated, discolored, dystrophic, thickened, and crumbly with subungual debris and tenderness to dorsal palpation. Evidence of blister roof on dorsal aspect of right great toe. Preulcerative lesion lateral aspect of left 4th digit.  Musculoskeletal Examination: Muscle strength 5/5 to all lower  extremity muscle groups bilaterally. HAV with bunion deformity noted b/l LE. Hammertoe deformity noted 2-5 b/l.  Neurological Examination: Protective sensation diminished with 10g monofilament b/l.     Latest Ref Rng & Units 03/27/2021    8:08 AM  Hemoglobin A1C  Hemoglobin-A1c <5.7 % of total Hgb 6.9     Assessment/Plan: 1. Pain due to onychomycosis of toenails of both feet   2. Diabetic peripheral neuropathy associated with type 2 diabetes mellitus ([place])     -Patient was evaluated and treated. All patient's and/or POA's questions/concerns answered on today's visit. -Continue foot and shoe inspections daily. Monitor blood glucose per PCP/Endocrinologist's recommendations. -Toenails 1-5 b/l were debrided in length and girth with sterile nail nippers and dremel without iatrogenic bleeding.  -Patient/POA to call should there be question/concern in the interim.   Return in about 3 months (around 03/31/2022).  Marzetta Board, DPM

## 2022-01-22 ENCOUNTER — Other Ambulatory Visit: Payer: Self-pay | Admitting: Family Medicine

## 2022-01-22 ENCOUNTER — Encounter: Payer: Self-pay | Admitting: Podiatry

## 2022-02-04 DIAGNOSIS — E119 Type 2 diabetes mellitus without complications: Secondary | ICD-10-CM | POA: Diagnosis not present

## 2022-02-05 ENCOUNTER — Other Ambulatory Visit: Payer: Self-pay | Admitting: Family Medicine

## 2022-02-05 NOTE — Telephone Encounter (Signed)
Requested medication (s) are due for refill today: yes  Requested medication (s) are on the active medication list: yes  Last refill:  10/16/21  Future visit scheduled: no  Notes to clinic:  Unable to refill per protocol due to failed labs, no updated results.    Requested Prescriptions  Pending Prescriptions Disp Refills   TRULICITY 1.5 WC/5.8NI SOPN [Pharmacy Med Name: TRULICITY 1.5 DP/8.2 ML PEN]  3    Sig: INJECT 1.'5MG'$  UNDER THE SKIN ONCE A WEEK     Endocrinology:  Diabetes - GLP-1 Receptor Agonists Failed - 02/05/2022  2:01 AM      Failed - HBA1C is between 0 and 7.9 and within 180 days    Hgb A1c MFr Bld  Date Value Ref Range Status  03/27/2021 6.9 (H) <5.7 % of total Hgb Final    Comment:    For someone without known diabetes, a hemoglobin A1c value of 6.5% or greater indicates that they may have  diabetes and this should be confirmed with a follow-up  test. . For someone with known diabetes, a value <7% indicates  that their diabetes is well controlled and a value  greater than or equal to 7% indicates suboptimal  control. A1c targets should be individualized based on  duration of diabetes, age, comorbid conditions, and  other considerations. . Currently, no consensus exists regarding use of hemoglobin A1c for diagnosis of diabetes for children. Renella Cunas - Valid encounter within last 6 months    Recent Outpatient Visits           2 months ago Right hip pain   [place] Dennard Schaumann Cammie Mcgee, MD   3 months ago Contusion of head, unspecified part of head, initial encounter   [place] Pickard, Cammie Mcgee, MD   4 months ago Memory loss   [place], Warren T, MD   8 months ago Interstitial lung disorders [facility])   [place] Pickard, Cammie Mcgee, MD   8 months ago Acute cough   [place] Pickard, Cammie Mcgee, MD

## 2022-02-19 ENCOUNTER — Telehealth: Payer: Self-pay

## 2022-02-19 NOTE — Telephone Encounter (Signed)
Dr. Judd Lien w/[facility] requesting to speak w/you what to about hospice status?  Please call

## 2022-02-19 NOTE — Telephone Encounter (Signed)
Dr. Jenene Slicker with Hospice called to advise that Rebecca Perkins is being discharged from Hospice care as FTT his resolving. Pt is currently living with daughter and is gaining weight and has increased function. He would like to discharge her to Palliative care and verbal order given per Dr. Samella Parr order. Thank you.

## 2022-02-20 ENCOUNTER — Ambulatory Visit
Admission: EM | Admit: 2022-02-20 | Discharge: 2022-02-20 | Disposition: A | Discharge status: Home / Self Care | Payer: Medicare Other | Attending: Nurse Practitioner | Admitting: Nurse Practitioner

## 2022-02-20 DIAGNOSIS — R399 Unspecified symptoms and signs involving the genitourinary system: Secondary | ICD-10-CM | POA: Insufficient documentation

## 2022-02-20 LAB — POCT URINALYSIS DIP (MANUAL ENTRY)
Bilirubin, UA: NEGATIVE
Blood, UA: NEGATIVE
Glucose, UA: NEGATIVE mg/dL
Ketones, POC UA: NEGATIVE mg/dL
Nitrite, UA: NEGATIVE
Protein Ur, POC: NEGATIVE mg/dL
Spec Grav, UA: 1.02 (ref 1.010–1.025)
Urobilinogen, UA: 0.2 E.U./dL
pH, UA: 5 (ref 5.0–8.0)

## 2022-02-20 MED ORDER — AMOXICILLIN-POT CLAVULANATE 875-125 MG PO TABS: 1.0000 | ORAL_TABLET | Freq: Two times a day (BID) | ORAL | 0 refills | Status: AC

## 2022-02-20 NOTE — ED Provider Notes (Signed)
[facility]    CSN: [id number] Arrival date & time: 02/20/22  1150      History   Chief Complaint Chief Complaint  Patient presents with   Dysuria    HPI Rebecca Perkins is a 88 y.o. female.   The history is provided by the patient, a relative and a caregiver.   Patient presents with her family and caregiver who reports that patient developed dysuria and foul-smelling urine that started 1 day ago.  Patient is incontinent so she wears a pad, family and caregiver unable to tell if she has had other symptoms such as frequency, urgency, hesitancy.  Family and caregiver further denies fever, chills, abdominal pain, nausea, vomiting, or diarrhea.  Patient's caregiver completed a home urinary tract infection test this morning which showed that she was positive for leukocytes.  Patient's family member states that the patient does not drink a lot of water.  Past Medical History:  Diagnosis Date   Anemia    Diabetes mellitus type 2, insulin dependent ([place])    Diabetic retinopathy ([place])    NPDR OU   Dyslipidemia    GERD (gastroesophageal reflux disease)    Hypertension    Hypertensive retinopathy    OU   Migraines    Osteoporosis    Pneumonia    PVD (peripheral vascular disease) ([place])    abi .83 (L), .92 (R)   RSD (reflex sympathetic dystrophy) 2007   R wrist/hand following fx   Stroke [facility])    TIA (transient ischemic attack)    Varicose veins     Patient Active Problem List   Diagnosis Date Noted   Bilateral impacted cerumen 02/22/2018   Conductive hearing loss, bilateral 02/22/2018   HTN (hypertension) 09/04/2017   GERD (gastroesophageal reflux disease) 09/04/2017   Diabetes mellitus type 2, insulin dependent ([place]) 09/04/2017   Anemia 09/04/2017   HLD (hyperlipidemia) 09/04/2017   CKD (chronic kidney disease) stage 3, GFR 30-59 ml/min ([place]) 09/04/2017   Aphasia 09/01/2015   TIA (transient ischemic attack) 06/17/2015   Osteoporosis 02/18/2015   Normocytic  anemia 12/13/2013   Diabetes mellitus type II, uncontrolled ([place]) 12/10/2013   Pneumonia 12/10/2013   Sepsis ([place]) 12/09/2013   Type 2 diabetes mellitus with insulin deficiency ([facility])    Hypertension    Hypertriglyceridemia    Dyslipidemia     Past Surgical History:  Procedure Laterality Date   [place]   BREAST CYST EXCISION     CATARACT EXTRACTION Bilateral 10/2009   CHOLECYSTECTOMY  1989   EYE SURGERY Bilateral    Cat Sx OU   Several benign cyst removed     last 1 in [place]      OB History     Gravida      Para      Term      Preterm      AB      Living  3      SAB      IAB      Ectopic      Multiple      Live Births               Home Medications    Prior to Admission medications   Medication Sig Start Date End Date Taking? Authorizing Provider  amoxicillin-clavulanate (AUGMENTIN) 875-125 MG tablet Take 1 tablet by mouth every 12 (twelve) hours. 02/20/22  Yes [place]-Warren, Alda Lea, NP  albuterol (VENTOLIN HFA) 108 (90 Base) MCG/ACT inhaler Inhale 1-2 puffs into the lungs in the morning and at bedtime. 06/11/21   Maryjane Hurter, MD  Blood Glucose Monitoring Suppl (ONE TOUCH ULTRA SYSTEM KIT) W/DEVICE KIT 1 kit by Does not apply route once. Tests Blood sugar before meals and at bedtime 07/10/14   [name], MD  Calcium Carbonate-Vitamin D (CALTRATE 600+D PO) Take by mouth 2 (two) times daily.    [provider]  clopidogrel (PLAVIX) 75 MG tablet TAKE 1 TABLET BY MOUTH EVERY DAY 02/03/21   [name], MD  Coenzyme Q10 (COQ10 PO) Take by mouth.    [provider]  Continuous Blood Gluc Sensor (FREESTYLE LIBRE 14 DAY SENSOR) MISC USE EVERY 14 DAYS 10/30/21   [name], MD  denosumab (PROLIA) 60 MG/ML SOSY injection Inject into the skin every 6 (six) months.  10/21/17   [provider]  donepezil (ARICEPT) 10 MG tablet  TAKE 1 TABLET BY MOUTH EVERYDAY AT BEDTIME 04/08/21   [name], MD  escitalopram (LEXAPRO) 10 MG tablet TAKE 1 TABLET BY MOUTH EVERY DAY 10/20/21   [name], MD  fenofibrate 160 MG tablet TAKE 1 TABLET BY MOUTH EVERY DAY 07/23/21   [name], MD  fluticasone [facility]) 50 MCG/ACT nasal spray SPRAY 2 SPRAYS INTO EACH NOSTRIL EVERY DAY 11/20/21   [name], MD  hydrochlorothiazide (MICROZIDE) 12.5 MG capsule Take 1 capsule (12.$RemoveBefor'5MG'AsVcsaWOvTja$ ) by mouth daily. 07/10/21   [name], MD  icosapent Ethyl (VASCEPA) 1 g capsule TAKE 2 CAPSULES BY MOUTH 2 TIMES DAILY. 06/01/21   [name], MD  insulin lispro (HUMALOG) 100 UNIT/ML KwikPen Inject SQ QD as directed per sliding scale-  <60 call MD, 100-150 0U, 151-200 2U, 201- 250 4U, 251- 300 6U, 301- 350 8U, 351- 400 10U 401- 450 12U,  >450 15U call MD 06/03/21   [name], MD  Insulin Pen Needle (B-D ULTRAFINE III SHORT PEN) 31G X 8 MM MISC INJECT AS DIRECTED 4 TIMES DAILY 08/21/19   [name], MD  INSULIN SYRINGE 1CC/29G 29G X 1/2" 1 ML MISC AS DIRECTED. 07/09/15   Elayne Snare, MD  KLOR-CON M20 20 MEQ tablet TAKE 1 TABLET BY MOUTH EVERY DAY 06/30/21   [name], MD  levETIRAcetam (KEPPRA) 500 MG tablet Take 1 tablet (500 mg total) by mouth 2 (two) times daily. 12/07/21   [name], MD  losartan (COZAAR) 100 MG tablet TAKE 1 TABLET BY MOUTH EVERY DAY 12/29/21   [name], MD  metoprolol succinate (TOPROL-XL) 50 MG 24 hr tablet TAKE 1 TABLET BY MOUTH EVERY DAY WITH OR IMMEDIATELY FOLLOWING A MEAL 12/17/21   [name], MD  Multiple Vitamin (MULTIVITAMIN) tablet Take 1 tablet by mouth daily.    [provider]  NONFORMULARY OR COMPOUNDED ITEM [facility]:  Peripheral Neuropathy Cream - Bupivacaine 1%, Doxepin 3%, Gabapentin 6%, Pentoxifylline 3%, Topiramate 1%, apply 1-2 grams to affected ares 3-4 times a day prn. Patient taking differently: [facility]:  Peripheral  Neuropathy Cream - Bupivacaine 1%, Doxepin 3%, Gabapentin 6%, Pentoxifylline 3%, Topiramate 1%, apply 1-2 grams to affected ares 3-4 times a day prn. 08/23/18   Marzetta Board, DPM  ONE TOUCH ULTRA TEST test strip USE FOUR TIMES DAILY BEFORE MEALS AND EVERY NIGHT AT BEDTIME 10/06/16   [name], MD  pantoprazole (PROTONIX) 40 MG tablet  TAKE 1 TABLET BY MOUTH EVERY DAY 10/20/21   Donita Brooks, MD  pioglitazone (ACTOS) 30 MG tablet TAKE 1 TABLET BY MOUTH EVERY DAY 07/21/20   Donita Brooks, MD  polyvinyl alcohol (LIQUIFILM TEARS) 1.4 % ophthalmic solution 1 drop as needed for dry eyes.    [provider]  Pyridoxine HCl (VITAMIN B-6) 100 MG tablet Take 100 mg by mouth daily.    [provider]  rosuvastatin (CRESTOR) 20 MG tablet Take 20 mg by mouth daily. 07/31/21   [provider]  sulfamethoxazole-trimethoprim (BACTRIM DS) 800-160 MG tablet Take 1 tablet by mouth 2 (two) times daily. 11/27/21   Donita Brooks, MD  TRULICITY 1.5 MG/0.5ML SOPN INJECT 1.5MG  UNDER THE SKIN ONCE A WEEK 02/17/22   Donita Brooks, MD    Family History Family History  Problem Relation Age of Onset   Stroke Mother 23   Hypertension Mother    Clotting disorder Father    Heart attack Father    Arrhythmia Sister    Stroke Brother    Thyroid disease Neg Hx     Social History Social History   Tobacco Use   Smoking status: Never   Smokeless tobacco: Never  Vaping Use   Vaping Use: Never used  Substance Use Topics   Alcohol use: No   Drug use: No     Allergies   Lipitor [atorvastatin calcium], Niacin and related, Statins, and Macrobid [nitrofurantoin macrocrystal]   Review of Systems Review of Systems Per HPI  Physical Exam Triage Vital Signs ED Triage Vitals  Enc Vitals Group     BP 02/20/22 1304 (!) 144/66     Pulse Rate 02/20/22 1304 67     Resp 02/20/22 1304 17     Temp 02/20/22 1304 (!) 97.4 F (36.3 C)     Temp Source 02/20/22 1304 Oral      SpO2 02/20/22 1304 97 %     Weight --      Height --      Head Circumference --      Peak Flow --      Pain Score 02/20/22 1303 0     Pain Loc --      Pain Edu? --      Excl. in GC? --    No data found.  Updated Vital Signs BP (!) 144/66 (BP Location: Right Arm)   Pulse 67   Temp (!) 97.4 F (36.3 C) (Oral)   Resp 17   SpO2 97%   Visual Acuity Right Eye Distance:   Left Eye Distance:   Bilateral Distance:    Right Eye Near:   Left Eye Near:    Bilateral Near:     Physical Exam Vitals reviewed.  Constitutional:      General: She is not in acute distress.    Appearance: She is well-developed.  HENT:     Head: Normocephalic.     Mouth/Throat:     Mouth: Mucous membranes are moist.  Eyes:     Extraocular Movements: Extraocular movements intact.     Pupils: Pupils are equal, round, and reactive to light.  Cardiovascular:     Rate and Rhythm: Normal rate and regular rhythm.     Heart sounds: Normal heart sounds.  Pulmonary:     Effort: Pulmonary effort is normal.     Breath sounds: Normal breath sounds.  Abdominal:     General: Bowel sounds are normal. There is no distension.  Palpations: Abdomen is soft.     Tenderness: There is no abdominal tenderness. There is no right CVA tenderness, left CVA tenderness, guarding or rebound.  Genitourinary:    Vagina: Normal. No vaginal discharge.  Musculoskeletal:     Cervical back: Normal range of motion.  Lymphadenopathy:     Cervical: No cervical adenopathy.  Skin:    General: Skin is warm and dry.     Findings: No erythema or rash.  Neurological:     General: No focal deficit present.     Mental Status: She is alert and oriented to person, place, and time.     Cranial Nerves: No cranial nerve deficit.  Psychiatric:        Behavior: Behavior normal.      UC Treatments / Results  Labs (all labs ordered are listed, but only abnormal results are displayed) Labs Reviewed  POCT URINALYSIS DIP (MANUAL ENTRY) -  Abnormal; Notable for the following components:      Result Value   Leukocytes, UA Trace (*)    All other components within normal limits    EKG   Radiology No results found.  Procedures Procedures (including critical care time)  Medications Ordered in UC Medications - No data to display  Initial Impression / Assessment and Plan / UC Course  I have reviewed the triage vital signs and the nursing notes.  Pertinent labs & imaging results that were available during my care of the patient were reviewed by me and considered in my medical decision making (see chart for details).  She brought in by her family and caregiver for for urinary tract infection symptoms.  Trace leukocytes were found on her urinalysis.  Urine culture is pending to confirm that patient does have a urinary tract infection.  We will start patient on Augmentin based on her age, incontinence, and decreased water intake.  Supportive care recommendations were provided to the patient's family.  Patient's family was advised that if the culture is negative, they will be contacted and asked to stop the medication.  Strict indications of when to go to the emergency department were provided.  Patient advised to follow-up with primary care if symptoms do not improve. Final Clinical Impressions(s) / UC Diagnoses   Final diagnoses:  Symptoms of urinary tract infection     Discharge Instructions      -Your urinalysis showed trace leukocytes.  A urine culture is pending to confirm that you do have a urinary tract infection.  You will be contacted if the results are negative to stop the antibiotic. -Take medications as prescribed. -Increase fluids.  Try to drink at least 4-6 8 ounce glasses of water daily. -Ibuprofen or Tylenol for pain, fever, or general discomfort. -Develop a toileting schedule that will allow you to toilet at least every 2 hours. -Follow-up in the emergency department if you develop fever, chills, worsening  abdominal pain, or other concerns.     ED Prescriptions     Medication Sig Dispense Auth. Provider   amoxicillin-clavulanate (AUGMENTIN) 875-125 MG tablet Take 1 tablet by mouth every 12 (twelve) hours. 14 tablet Precious Gilchrest-Warren, Alda Lea, NP      PDMP not reviewed this encounter.   Tish Men, NP 02/20/22 1348

## 2022-02-20 NOTE — Discharge Instructions (Addendum)
-  Your urinalysis showed trace leukocytes.  A urine culture is pending to confirm that you do have a urinary tract infection.  You will be contacted if the results are negative to stop the antibiotic. -Take medications as prescribed. -Increase fluids.  Try to drink at least 4-6 8 ounce glasses of water daily. -Ibuprofen or Tylenol for pain, fever, or general discomfort. -Develop a toileting schedule that will allow you to toilet at least every 2 hours. -Follow-up in the emergency department if you develop fever, chills, worsening abdominal pain, or other concerns.

## 2022-02-20 NOTE — ED Triage Notes (Signed)
Per family, caregiver said opt urine smell bad yesterday and urine test at he test posistve for infection.

## 2022-02-21 LAB — URINE CULTURE: Culture: NO GROWTH

## 2022-02-27 ENCOUNTER — Other Ambulatory Visit: Payer: Self-pay | Admitting: Family Medicine

## 2022-02-28 ENCOUNTER — Other Ambulatory Visit: Payer: Self-pay | Admitting: Family Medicine

## 2022-03-01 NOTE — Telephone Encounter (Signed)
Requested medications are due for refill today.  yes  Requested medications are on the active medications list.  yes  Last refill. 02/03/2021 #90 3 refills  Future visit scheduled.   no  Notes to clinic.  Labs are expired.    Requested Prescriptions  Pending Prescriptions Disp Refills   clopidogrel (PLAVIX) 75 MG tablet [Pharmacy Med Name: CLOPIDOGREL 75 MG TABLET] 90 tablet 3    Sig: TAKE 1 TABLET BY MOUTH EVERY DAY     Hematology: Antiplatelets - clopidogrel Failed - 02/27/2022  9:51 AM      Failed - HCT in normal range and within 180 days    HCT  Date Value Ref Range Status  06/10/2021 32.2 (L) 36.0 - 46.0 % Final         Failed - HGB in normal range and within 180 days    Hemoglobin  Date Value Ref Range Status  06/10/2021 10.2 (L) 12.0 - 15.0 g/dL Final         Failed - PLT in normal range and within 180 days    Platelets  Date Value Ref Range Status  06/10/2021 291 150 - 400 K/uL Final         Failed - Cr in normal range and within 360 days    Creat  Date Value Ref Range Status  03/27/2021 1.13 (H) 0.60 - 0.95 mg/dL Final   Creatinine, Ser  Date Value Ref Range Status  06/10/2021 1.08 (H) 0.44 - 1.00 mg/dL Final   Creatinine,U  Date Value Ref Range Status  06/06/2018 75.0 mg/dL Final         Passed - Valid encounter within last 6 months    Recent Outpatient Visits           3 months ago Right hip pain   [place] [name], MD   4 months ago Contusion of head, unspecified part of head, initial encounter   [place], Cammie Mcgee, MD   5 months ago Memory loss   [place], Warren T, MD   8 months ago Interstitial lung disorders [facility])   [place] Pickard, Cammie Mcgee, MD   9 months ago Acute cough   [place], Cammie Mcgee, MD       Future Appointments             In 2 days Gusler, Pauline Aus, NP [facility]

## 2022-03-02 NOTE — Telephone Encounter (Signed)
Requested Prescriptions  Pending Prescriptions Disp Refills  . levETIRAcetam (KEPPRA) 500 MG tablet [Pharmacy Med Name: LEVETIRACETAM 500 MG TABLET] 180 tablet 0    Sig: TAKE 1 TABLET BY MOUTH TWICE A DAY     Neurology:  Anticonvulsants - levetiracetam Failed - 02/28/2022  8:30 AM      Failed - Cr in normal range and within 360 days    Creat  Date Value Ref Range Status  03/27/2021 1.13 (H) 0.60 - 0.95 mg/dL Final   Creatinine, Ser  Date Value Ref Range Status  06/10/2021 1.08 (H) 0.44 - 1.00 mg/dL Final   Creatinine,U  Date Value Ref Range Status  06/06/2018 75.0 mg/dL Final         Passed - Completed PHQ-2 or PHQ-9 in the last 360 days      Passed - Valid encounter within last 12 months    Recent Outpatient Visits          3 months ago Right hip pain   [place] [name], MD   4 months ago Contusion of head, unspecified part of head, initial encounter   [place] Pickard, Cammie Mcgee, MD   5 months ago Memory loss   [place], Warren T, MD   8 months ago Interstitial lung disorders [facility])   [place] Pickard, Cammie Mcgee, MD   9 months ago Acute cough   [place] Pickard, Cammie Mcgee, MD      Future Appointments            Tomorrow Gusler, Pauline Aus, NP [facility]

## 2022-03-03 ENCOUNTER — Telehealth: Payer: Medicare PPO | Admitting: Nurse Practitioner

## 2022-03-03 ENCOUNTER — Encounter: Payer: Self-pay | Admitting: Nurse Practitioner

## 2022-03-03 DIAGNOSIS — I639 Cerebral infarction, unspecified: Secondary | ICD-10-CM

## 2022-03-03 DIAGNOSIS — R5381 Other malaise: Secondary | ICD-10-CM

## 2022-03-03 DIAGNOSIS — Z515 Encounter for palliative care: Secondary | ICD-10-CM

## 2022-03-03 NOTE — Progress Notes (Signed)
[facility] Consult Note Telephone: [phone]  Fax: [phone]   Date of encounter: 03/03/22 7:14 PM PATIENT NAME: Rebecca Perkins [address] [place] [address]   [phone] (home)  DOB: 01/08/1929 MRN: [id number] PRIMARY CARE PROVIDER:    [name], MD,  [address] [place] [address] [phone]  REFERRING PROVIDER:   [name], MD [address] [place],   [address] [phone]  RESPONSIBLE PARTY:    Contact Information     Name Relation Home Work Mobile   Rolena, Knutson [phone]  [phone]   Emalia, Witkop [phone]  [phone]   BROOKELYN, GAYNOR Daughter   [phone]      Due to the COVID-19 crisis, this visit was done via telemedicine from my office and it was initiated and consent by this patient and or family.  I connected with daughter, Rebecca Perkins with  [facility] on 03/03/22 by a telephone as video not available enabled telemedicine application and verified that I am speaking with the correct person.   I discussed the limitations of evaluation and management by telemedicine. The patient expressed understanding and agreed to proceed. Palliative Care was asked to follow this patient by consultation request of  [name], MD to address advance care planning and complex medical decision making. This is the initial visit.                 ASSESSMENT AND PLAN / RECOMMENDATIONS:  Symptom Management/Plan: 1. Advance Care Planning;  previously under hospice services but d/c due to stability recently, will re-visit hospice benefit when declines including worsening cognitive functioning, not ambulatory currently unsteady gait, weight loss as currently she has a good appetite, frequent infections, wounds. Discussed with daughter Rebecca Perkins, verbalized understanding.   2. Goals of Care: Goals include to maximize quality of life and symptom  management. Our advance care planning conversation included a discussion about:    The value and importance of advance care planning  Exploration of personal, cultural or spiritual beliefs that might influence medical decisions  Exploration of goals of care in the event of a sudden injury or illness  Identification and preparation of a healthcare agent  Review and updating or creation of an advance directive document.  3. Debility secondary to Late onset CVA; progressive declines, has caregivers will bath, dress, assist with ambulating lifting up out of a chair with walker. Incontinence bowel/bladder, does feed herself with good appetite.   4. Palliative care encounter; Palliative care encounter; Palliative medicine team will continue to support patient, patient's family, and medical team. Visit consisted of counseling and education dealing with the complex and emotionally intense issues of symptom management and palliative care in the setting of serious and potentially life-threatening illness  Follow up Palliative Care Visit: Palliative care will continue to follow for complex medical decision making, advance care planning, and clarification of goals. Return 4 weeks or prn.  I spent 35 minutes providing this consultation. More than 50% of the time in this consultation was spent in counseling and care coordination. PPS: 40% Chief Complaint: Initial palliative consult for complex medical decision making, address goals, manage ongoing symptoms  HISTORY OF PRESENT ILLNESS:  Rebecca Perkins is a 88 y.o. year old female  with multiple medical problems including late onset CVA, h/o TIA's, CKD, aphasia, DM, diabetic retinopathy, HTN, HLD, GERD, h/o migraines, osteoporosis, PVD, reflex sympathetic dystrophy. Recently discharged by Hospice services  as stabilized under Hospice benefit under Medicare program. ED visit 02/20/2022 for UTI, treated abx; Ms. Dangerfield resides at home with caregivers. I called Ms.  Peto daughter Rebecca Perkins who is a Therapist, sports for telephonic, telemedicine visit as video not available. Rebecca Perkins and I talked about purpose of pc visit, past medical history, cognitive and functional decline. We talked about progressive declines, has caregivers will bath, dress, assist with ambulating lifting up out of a chair with walker. Incontinence bowel/bladder, does feed herself with good appetite. We talked about recent d/c from hospice service due to stability. We talked about ros. We talked about Ms. Kist sleeping most of the day, night as they have to wake her up for the day, keep her awake. We talked about medical goals. We talked about hospice eligibility. We talked about role pc in poc. Discussed f/u visit, scheduled and will have PC RN see in 4 weeks. Family/social history reviewed; Therapeutic listening, emotional support provided, Questions answered. Contact information provided.   History obtained from review of EMR, discussion with primary team, and interview with family, facility staff/caregiver and/or Ms. Lueck.  I reviewed available labs, medications, imaging, studies and related documents from the EMR.  Records reviewed and summarized above.   ROS 10 point system reviewed with daughter, all negative except HPI  Physical Exam: Deferred CURRENT PROBLEM LIST:  Patient Active Problem List   Diagnosis Date Noted   Bilateral impacted cerumen 02/22/2018   Conductive hearing loss, bilateral 02/22/2018   HTN (hypertension) 09/04/2017   GERD (gastroesophageal reflux disease) 09/04/2017   Diabetes mellitus type 2, insulin dependent ([facility]) 09/04/2017   Anemia 09/04/2017   HLD (hyperlipidemia) 09/04/2017   CKD (chronic kidney disease) stage 3, GFR 30-59 ml/min ([facility]) 09/04/2017   Aphasia 09/01/2015   TIA (transient ischemic attack) 06/17/2015   Osteoporosis 02/18/2015   Normocytic anemia 12/13/2013   Diabetes mellitus type II, uncontrolled ([facility]) 12/10/2013   Pneumonia 12/10/2013   Sepsis ([facility])  12/09/2013   Type 2 diabetes mellitus with insulin deficiency ([facility])    Hypertension    Hypertriglyceridemia    Dyslipidemia    PAST MEDICAL HISTORY:  Active Ambulatory Problems    Diagnosis Date Noted   Type 2 diabetes mellitus with insulin deficiency ([facility])    Hypertension    Hypertriglyceridemia    Dyslipidemia    Sepsis ([facility]) 12/09/2013   Diabetes mellitus type II, uncontrolled ([facility]) 12/10/2013   Pneumonia 12/10/2013   Normocytic anemia 12/13/2013   Osteoporosis 02/18/2015   TIA (transient ischemic attack) 06/17/2015   Aphasia 09/01/2015   HTN (hypertension) 09/04/2017   GERD (gastroesophageal reflux disease) 09/04/2017   Diabetes mellitus type 2, insulin dependent ([facility]) 09/04/2017   Anemia 09/04/2017   HLD (hyperlipidemia) 09/04/2017   CKD (chronic kidney disease) stage 3, GFR 30-59 ml/min (H[facility]) 09/04/2017   Bilateral impacted cerumen 02/22/2018   Conductive hearing loss, bilateral 02/22/2018   Resolved Ambulatory Problems    Diagnosis Date Noted   No Resolved Ambulatory Problems   Past Medical History:  Diagnosis Date   Diabetic retinopathy ([facility])    Hypertensive retinopathy    Migraines    PVD (peripheral vascular disease) ([facility])    RSD (reflex sympathetic dystrophy) 2007   Stroke ([facility])    Varicose veins    SOCIAL HX:  Social History   Tobacco Use   Smoking status: Never   Smokeless tobacco: Never  Substance Use Topics   Alcohol use: No   FAMILY HX:  Family History  Problem Relation Age of Onset  Stroke Mother 42   Hypertension Mother    Clotting disorder Father    Heart attack Father    Arrhythmia Sister    Stroke Brother    Thyroid disease Neg Hx       ALLERGIES:  Allergies  Allergen Reactions   Lipitor [Atorvastatin Calcium] Diarrhea   Niacin And Related Hives   Statins Diarrhea   Macrobid [Nitrofurantoin Macrocrystal] Other (See Comments)    unknown     PERTINENT MEDICATIONS:  Outpatient Encounter Medications as of 03/03/2022   Medication Sig   albuterol (VENTOLIN HFA) 108 (90 Base) MCG/ACT inhaler Inhale 1-2 puffs into the lungs in the morning and at bedtime.   amoxicillin-clavulanate (AUGMENTIN) 875-125 MG tablet Take 1 tablet by mouth every 12 (twelve) hours.   Blood Glucose Monitoring Suppl (ONE TOUCH ULTRA SYSTEM KIT) W/DEVICE KIT 1 kit by Does not apply route once. Tests Blood sugar before meals and at bedtime   Calcium Carbonate-Vitamin D (CALTRATE 600+D PO) Take by mouth 2 (two) times daily.   clopidogrel (PLAVIX) 75 MG tablet TAKE 1 TABLET BY MOUTH EVERY DAY   Coenzyme Q10 (COQ10 PO) Take by mouth.   Continuous Blood Gluc Sensor (FREESTYLE LIBRE 14 DAY SENSOR) MISC USE EVERY 14 DAYS   denosumab (PROLIA) 60 MG/ML SOSY injection Inject into the skin every 6 (six) months.    donepezil (ARICEPT) 10 MG tablet TAKE 1 TABLET BY MOUTH EVERYDAY AT BEDTIME   escitalopram (LEXAPRO) 10 MG tablet TAKE 1 TABLET BY MOUTH EVERY DAY   fenofibrate 160 MG tablet TAKE 1 TABLET BY MOUTH EVERY DAY   fluticasone (FLONASE) 50 MCG/ACT nasal spray SPRAY 2 SPRAYS INTO EACH NOSTRIL EVERY DAY   hydrochlorothiazide (MICROZIDE) 12.5 MG capsule Take 1 capsule (12.$RemoveBefor'5MG'wYBPdWyHoSHE$ ) by mouth daily.   icosapent Ethyl (VASCEPA) 1 g capsule TAKE 2 CAPSULES BY MOUTH 2 TIMES DAILY.   insulin lispro (HUMALOG) 100 UNIT/ML KwikPen Inject SQ QD as directed per sliding scale-  <60 call MD, 100-150 0U, 151-200 2U, 201- 250 4U, 251- 300 6U, 301- 350 8U, 351- 400 10U 401- 450 12U,  >450 15U call MD   Insulin Pen Needle (B-D ULTRAFINE III SHORT PEN) 31G X 8 MM MISC INJECT AS DIRECTED 4 TIMES DAILY   INSULIN SYRINGE 1CC/29G 29G X 1/2" 1 ML MISC AS DIRECTED.   KLOR-CON M20 20 MEQ tablet TAKE 1 TABLET BY MOUTH EVERY DAY   levETIRAcetam (KEPPRA) 500 MG tablet TAKE 1 TABLET BY MOUTH TWICE A DAY   losartan (COZAAR) 100 MG tablet TAKE 1 TABLET BY MOUTH EVERY DAY   metoprolol succinate (TOPROL-XL) 50 MG 24 hr tablet TAKE 1 TABLET BY MOUTH EVERY DAY WITH OR IMMEDIATELY  FOLLOWING A MEAL   Multiple Vitamin (MULTIVITAMIN) tablet Take 1 tablet by mouth daily.   NONFORMULARY OR COMPOUNDED ITEM [facility]:  Peripheral Neuropathy Cream - Bupivacaine 1%, Doxepin 3%, Gabapentin 6%, Pentoxifylline 3%, Topiramate 1%, apply 1-2 grams to affected ares 3-4 times a day prn. (Patient taking differently: [facility]:  Peripheral Neuropathy Cream - Bupivacaine 1%, Doxepin 3%, Gabapentin 6%, Pentoxifylline 3%, Topiramate 1%, apply 1-2 grams to affected ares 3-4 times a day prn.)   ONE TOUCH ULTRA TEST test strip USE FOUR TIMES DAILY BEFORE MEALS AND EVERY NIGHT AT BEDTIME   pantoprazole (PROTONIX) 40 MG tablet TAKE 1 TABLET BY MOUTH EVERY DAY   pioglitazone (ACTOS) 30 MG tablet TAKE 1 TABLET BY MOUTH EVERY DAY   polyvinyl alcohol (LIQUIFILM TEARS) 1.4 % ophthalmic solution 1 drop  as needed for dry eyes.   Pyridoxine HCl (VITAMIN B-6) 100 MG tablet Take 100 mg by mouth daily.   rosuvastatin (CRESTOR) 20 MG tablet Take 20 mg by mouth daily.   sulfamethoxazole-trimethoprim (BACTRIM DS) 800-160 MG tablet Take 1 tablet by mouth 2 (two) times daily.   TRULICITY 1.5 BS/4.9QP SOPN INJECT 1.$RemoveBefor'5MG'[id number]$  UNDER THE SKIN ONCE A WEEK   Facility-Administered Encounter Medications as of 03/03/2022  Medication   denosumab (PROLIA) injection 60 mg   Thank you for the opportunity to participate in the care of Ms. Ahlin.  The palliative care team will continue to follow. Please call our office at [phone] if we can be of additional assistance.   Lundy Cozart Z Dorr Perrot, NP ,

## 2022-03-12 ENCOUNTER — Other Ambulatory Visit: Payer: Self-pay | Admitting: Family Medicine

## 2022-03-12 ENCOUNTER — Telehealth: Payer: Self-pay

## 2022-03-12 MED ORDER — CEPHALEXIN 500 MG PO CAPS: 500.0000 mg | ORAL_CAPSULE | Freq: Three times a day (TID) | ORAL | 0 refills | Status: AC

## 2022-03-12 NOTE — Telephone Encounter (Addendum)
Pt's daughter, Juliann Pulse, who is an Therapist, sports, called stating that pt is having another UTI infection again. Pt has been c/o burning sensation when urinating. Daughter, bought an OTC tet UTI it show positive for leukocytes  Pt needs another antibiotic, due to her health pt is unable to get to the clinic.  Pls advice

## 2022-03-15 ENCOUNTER — Emergency Department (HOSPITAL_COMMUNITY)
Admission: EM | Admit: 2022-03-15 | Discharge: 2022-03-15 | Disposition: A | Discharge status: Home / Self Care | Payer: Medicare PPO | Attending: Emergency Medicine | Admitting: Emergency Medicine

## 2022-03-15 ENCOUNTER — Emergency Department (HOSPITAL_COMMUNITY): Payer: Medicare PPO

## 2022-03-15 DIAGNOSIS — I639 Cerebral infarction, unspecified: Secondary | ICD-10-CM

## 2022-03-15 DIAGNOSIS — Z79899 Other long term (current) drug therapy: Secondary | ICD-10-CM | POA: Insufficient documentation

## 2022-03-15 DIAGNOSIS — I129 Hypertensive chronic kidney disease with stage 1 through stage 4 chronic kidney disease, or unspecified chronic kidney disease: Secondary | ICD-10-CM | POA: Insufficient documentation

## 2022-03-15 DIAGNOSIS — Z794 Long term (current) use of insulin: Secondary | ICD-10-CM | POA: Insufficient documentation

## 2022-03-15 DIAGNOSIS — R4701 Aphasia: Secondary | ICD-10-CM | POA: Diagnosis not present

## 2022-03-15 DIAGNOSIS — R531 Weakness: Secondary | ICD-10-CM

## 2022-03-15 DIAGNOSIS — E1122 Type 2 diabetes mellitus with diabetic chronic kidney disease: Secondary | ICD-10-CM | POA: Insufficient documentation

## 2022-03-15 DIAGNOSIS — G459 Transient cerebral ischemic attack, unspecified: Secondary | ICD-10-CM | POA: Diagnosis not present

## 2022-03-15 DIAGNOSIS — R4182 Altered mental status, unspecified: Secondary | ICD-10-CM | POA: Diagnosis not present

## 2022-03-15 DIAGNOSIS — N189 Chronic kidney disease, unspecified: Secondary | ICD-10-CM | POA: Insufficient documentation

## 2022-03-15 DIAGNOSIS — D649 Anemia, unspecified: Secondary | ICD-10-CM | POA: Diagnosis not present

## 2022-03-15 DIAGNOSIS — Z20822 Contact with and (suspected) exposure to covid-19: Secondary | ICD-10-CM | POA: Diagnosis not present

## 2022-03-15 DIAGNOSIS — R471 Dysarthria and anarthria: Secondary | ICD-10-CM | POA: Diagnosis not present

## 2022-03-15 DIAGNOSIS — Z7902 Long term (current) use of antithrombotics/antiplatelets: Secondary | ICD-10-CM | POA: Diagnosis not present

## 2022-03-15 DIAGNOSIS — F0390 Unspecified dementia without behavioral disturbance: Secondary | ICD-10-CM | POA: Diagnosis not present

## 2022-03-15 DIAGNOSIS — I10 Essential (primary) hypertension: Secondary | ICD-10-CM | POA: Diagnosis not present

## 2022-03-15 DIAGNOSIS — I6523 Occlusion and stenosis of bilateral carotid arteries: Secondary | ICD-10-CM | POA: Diagnosis not present

## 2022-03-15 DIAGNOSIS — I491 Atrial premature depolarization: Secondary | ICD-10-CM | POA: Diagnosis not present

## 2022-03-15 LAB — I-STAT CHEM 8, ED
BUN: 25 mg/dL — ABNORMAL HIGH (ref 8–23)
Calcium, Ion: 1.41 mmol/L — ABNORMAL HIGH (ref 1.15–1.40)
Chloride: 102 mmol/L (ref 98–111)
Creatinine, Ser: 0.8 mg/dL (ref 0.44–1.00)
Glucose, Bld: 175 mg/dL — ABNORMAL HIGH (ref 70–99)
HCT: 37 % (ref 36.0–46.0)
Hemoglobin: 12.6 g/dL (ref 12.0–15.0)
Potassium: 4.1 mmol/L (ref 3.5–5.1)
Sodium: 140 mmol/L (ref 135–145)
TCO2: 31 mmol/L (ref 22–32)

## 2022-03-15 LAB — PROTIME-INR
INR: 1 (ref 0.8–1.2)
Prothrombin Time: 13.5 seconds (ref 11.4–15.2)

## 2022-03-15 LAB — COMPREHENSIVE METABOLIC PANEL
ALT: 12 U/L (ref 0–44)
AST: 14 U/L — ABNORMAL LOW (ref 15–41)
Albumin: 3 g/dL — ABNORMAL LOW (ref 3.5–5.0)
Alkaline Phosphatase: 80 U/L (ref 38–126)
Anion gap: 5 (ref 5–15)
BUN: 26 mg/dL — ABNORMAL HIGH (ref 8–23)
CO2: 29 mmol/L (ref 22–32)
Calcium: 10 mg/dL (ref 8.9–10.3)
Chloride: 105 mmol/L (ref 98–111)
Creatinine, Ser: 0.78 mg/dL (ref 0.44–1.00)
GFR, Estimated: >60 mL/min (ref >60)
Glucose, Bld: 181 mg/dL — ABNORMAL HIGH (ref 70–99)
Potassium: 4.1 mmol/L (ref 3.5–5.1)
Sodium: 139 mmol/L (ref 135–145)
Total Bilirubin: 0.2 mg/dL — ABNORMAL LOW (ref 0.3–1.2)
Total Protein: 7.1 g/dL (ref 6.5–8.1)

## 2022-03-15 LAB — HEMOGLOBIN A1C
Hgb A1c MFr Bld: 7.1 % — ABNORMAL HIGH (ref 4.8–5.6)
Mean Plasma Glucose: 157.07 mg/dL

## 2022-03-15 LAB — CBG MONITORING, ED: Glucose-Capillary: 166 mg/dL — ABNORMAL HIGH (ref 70–99)

## 2022-03-15 LAB — CBC
HCT: 36.6 % (ref 36.0–46.0)
Hemoglobin: 11.6 g/dL — ABNORMAL LOW (ref 12.0–15.0)
MCH: 28.4 pg (ref 26.0–34.0)
MCHC: 31.7 g/dL (ref 30.0–36.0)
MCV: 89.7 fL (ref 80.0–100.0)
Platelets: 214 10*3/uL (ref 150–400)
RBC: 4.08 MIL/uL (ref 3.87–5.11)
RDW: 11.9 % (ref 11.5–15.5)
WBC: 5.8 10*3/uL (ref 4.0–10.5)
nRBC: 0 % (ref 0.0–0.2)

## 2022-03-15 LAB — RESP PANEL BY RT-PCR (FLU A&B, COVID) ARPGX2
Influenza A by PCR: NEGATIVE
Influenza B by PCR: NEGATIVE
SARS Coronavirus 2 by RT PCR: NEGATIVE

## 2022-03-15 LAB — DIFFERENTIAL
Abs Immature Granulocytes: 0.01 10*3/uL (ref 0.00–0.07)
Basophils Absolute: 0.1 10*3/uL (ref 0.0–0.1)
Basophils Relative: 1 %
Eosinophils Absolute: 0.2 10*3/uL (ref 0.0–0.5)
Eosinophils Relative: 4 %
Immature Granulocytes: 0 %
Lymphocytes Relative: 35 %
Lymphs Abs: 2.1 10*3/uL (ref 0.7–4.0)
Monocytes Absolute: 0.7 10*3/uL (ref 0.1–1.0)
Monocytes Relative: 12 %
Neutro Abs: 2.8 10*3/uL (ref 1.7–7.7)
Neutrophils Relative %: 48 %

## 2022-03-15 LAB — ETHANOL: Alcohol, Ethyl (B): <10 mg/dL (ref <10)

## 2022-03-15 LAB — APTT: aPTT: 27 seconds (ref 24–36)

## 2022-03-15 MED ORDER — IOHEXOL 350 MG/ML SOLN
100.0000 mL | Freq: Once | INTRAVENOUS | Status: AC | PRN
  Administered 2022-03-15: 75 mL via INTRAVENOUS

## 2022-03-15 MED ORDER — ASPIRIN 81 MG PO CHEW: 81.0000 mg | CHEWABLE_TABLET | Freq: Every day | ORAL | Status: DC

## 2022-03-15 MED ORDER — CLOPIDOGREL BISULFATE 75 MG PO TABS: 75.0000 mg | ORAL_TABLET | Freq: Every day | ORAL | Status: DC

## 2022-03-15 NOTE — ED Provider Notes (Signed)
[facility] EMERGENCY DEPARTMENT Provider Note   CSN: [id number] Arrival date & time: 03/15/22  1831  An emergency department physician performed an initial assessment on this suspected stroke patient at 1833.  History  Chief Complaint  Patient presents with   Code Stroke    Rebecca Perkins is a 88 y.o. female.  HPI Patient presents for strokelike symptoms.  Medical history includes anemia, DM 2, retinopathy, HLD, GERD, HTN, migraine headaches, TIA, CKD.  Patient's family reports that she had acute onset of right-sided facial droop, garbled speech, and right hemibody weakness.  This occurred approximately 1 hour prior to arrival.  EMS also noted right facial droop on scene which did improve during transit.  CBG prior to arrival was 201.  Patient currently denies any areas of discomfort.    Home Medications Prior to Admission medications   Medication Sig Start Date End Date Taking? Authorizing Provider  albuterol (VENTOLIN HFA) 108 (90 Base) MCG/ACT inhaler Inhale 1-2 puffs into the lungs in the morning and at bedtime. 06/11/21   Omar Person, MD  amoxicillin-clavulanate (AUGMENTIN) 875-125 MG tablet Take 1 tablet by mouth every 12 (twelve) hours. 02/20/22   Leath-Warren, Sadie Haber, NP  Blood Glucose Monitoring Suppl (ONE TOUCH ULTRA SYSTEM KIT) W/DEVICE KIT 1 kit by Does not apply route once. Tests Blood sugar before meals and at bedtime 07/10/14   Donita Brooks, MD  Calcium Carbonate-Vitamin D (CALTRATE 600+D PO) Take by mouth 2 (two) times daily.    [provider]  cephALEXin (KEFLEX) 500 MG capsule Take 1 capsule (500 mg total) by mouth 3 (three) times daily. 03/12/22   Donita Brooks, MD  clopidogrel (PLAVIX) 75 MG tablet TAKE 1 TABLET BY MOUTH EVERY DAY 03/01/22   Donita Brooks, MD  Coenzyme Q10 (COQ10 PO) Take by mouth.    [provider]  Continuous Blood Gluc Sensor (FREESTYLE LIBRE 14 DAY SENSOR) MISC USE EVERY 14 DAYS 10/30/21   Donita Brooks, MD  denosumab (PROLIA) 60 MG/ML SOSY injection Inject into the skin every 6 (six) months.  10/21/17   [provider]  donepezil (ARICEPT) 10 MG tablet TAKE 1 TABLET BY MOUTH EVERYDAY AT BEDTIME 04/08/21   Donita Brooks, MD  escitalopram (LEXAPRO) 10 MG tablet TAKE 1 TABLET BY MOUTH EVERY DAY 10/20/21   Donita Brooks, MD  fenofibrate 160 MG tablet TAKE 1 TABLET BY MOUTH EVERY DAY 07/23/21   Donita Brooks, MD  fluticasone [facility]) 50 MCG/ACT nasal spray SPRAY 2 SPRAYS INTO EACH NOSTRIL EVERY DAY 11/20/21   Donita Brooks, MD  hydrochlorothiazide (MICROZIDE) 12.5 MG capsule Take 1 capsule (12.5MG ) by mouth daily. 07/10/21   Donita Brooks, MD  icosapent Ethyl (VASCEPA) 1 g capsule TAKE 2 CAPSULES BY MOUTH 2 TIMES DAILY. 06/01/21   Donita Brooks, MD  insulin lispro (HUMALOG) 100 UNIT/ML KwikPen Inject SQ QD as directed per sliding scale-  <60 call MD, 100-150 0U, 151-200 2U, 201- 250 4U, 251- 300 6U, 301- 350 8U, 351- 400 10U 401- 450 12U,  >450 15U call MD 06/03/21   Donita Brooks, MD  Insulin Pen Needle (B-D ULTRAFINE III SHORT PEN) 31G X 8 MM MISC INJECT AS DIRECTED 4 TIMES DAILY 08/21/19   Donita Brooks, MD  INSULIN SYRINGE 1CC/29G 29G X 1/2" 1 ML MISC AS DIRECTED. 07/09/15   Reather Littler, MD  KLOR-CON M20 20 MEQ tablet TAKE 1 TABLET BY MOUTH EVERY DAY 06/30/21  Donita Brooks, MD  levETIRAcetam (KEPPRA) 500 MG tablet TAKE 1 TABLET BY MOUTH TWICE A DAY 03/02/22   Donita Brooks, MD  losartan (COZAAR) 100 MG tablet TAKE 1 TABLET BY MOUTH EVERY DAY 12/29/21   Donita Brooks, MD  metoprolol succinate (TOPROL-XL) 50 MG 24 hr tablet TAKE 1 TABLET BY MOUTH EVERY DAY WITH OR IMMEDIATELY FOLLOWING A MEAL 12/17/21   Donita Brooks, MD  Multiple Vitamin (MULTIVITAMIN) tablet Take 1 tablet by mouth daily.    [provider]  NONFORMULARY OR COMPOUNDED ITEM [facility]:  Peripheral Neuropathy Cream - Bupivacaine 1%, Doxepin 3%, Gabapentin 6%,  Pentoxifylline 3%, Topiramate 1%, apply 1-2 grams to affected ares 3-4 times a day prn. Patient taking differently: [facility]:  Peripheral Neuropathy Cream - Bupivacaine 1%, Doxepin 3%, Gabapentin 6%, Pentoxifylline 3%, Topiramate 1%, apply 1-2 grams to affected ares 3-4 times a day prn. 08/23/18   Freddie Breech, DPM  ONE TOUCH ULTRA TEST test strip USE FOUR TIMES DAILY BEFORE MEALS AND EVERY NIGHT AT BEDTIME 10/06/16   Donita Brooks, MD  pantoprazole (PROTONIX) 40 MG tablet TAKE 1 TABLET BY MOUTH EVERY DAY 10/20/21   Donita Brooks, MD  pioglitazone (ACTOS) 30 MG tablet TAKE 1 TABLET BY MOUTH EVERY DAY 07/21/20   Donita Brooks, MD  polyvinyl alcohol (LIQUIFILM TEARS) 1.4 % ophthalmic solution 1 drop as needed for dry eyes.    [provider]  Pyridoxine HCl (VITAMIN B-6) 100 MG tablet Take 100 mg by mouth daily.    [provider]  rosuvastatin (CRESTOR) 20 MG tablet Take 20 mg by mouth daily. 07/31/21   [provider]  sulfamethoxazole-trimethoprim (BACTRIM DS) 800-160 MG tablet Take 1 tablet by mouth 2 (two) times daily. 11/27/21   Donita Brooks, MD  TRULICITY 1.5 MG/0.5ML SOPN INJECT 1.5MG  UNDER THE SKIN ONCE A WEEK 02/17/22   Donita Brooks, MD      Allergies    Lipitor [atorvastatin calcium], Niacin and related, Statins, and Macrobid [nitrofurantoin macrocrystal]    Review of Systems   Review of Systems  Neurological:  Positive for facial asymmetry, speech difficulty and weakness.  All other systems reviewed and are negative.   Physical Exam Updated Vital Signs BP (!) 134/57   Pulse 77   Temp 98.2 F (36.8 C)   Resp 16   SpO2 96%  Physical Exam Vitals and nursing note reviewed.  Constitutional:      General: She is not in acute distress.    Appearance: She is well-developed. She is not toxic-appearing or diaphoretic.  HENT:     Head: Normocephalic and atraumatic.     Right Ear: External ear normal.     Left Ear:  External ear normal.     Nose: Nose normal.     Mouth/Throat:     Mouth: Mucous membranes are moist.     Pharynx: Oropharynx is clear.  Eyes:     Extraocular Movements: Extraocular movements intact.     Conjunctiva/sclera: Conjunctivae normal.  Cardiovascular:     Rate and Rhythm: Normal rate and regular rhythm.     Heart sounds: No murmur heard. Pulmonary:     Effort: Pulmonary effort is normal. No respiratory distress.  Abdominal:     Palpations: Abdomen is soft.     Tenderness: There is no abdominal tenderness. There is no guarding.  Musculoskeletal:        General: No swelling. Normal range of motion.  Cervical back: Normal range of motion and neck supple.     Right lower leg: No edema.     Left lower leg: No edema.  Skin:    General: Skin is warm and dry.     Coloration: Skin is not jaundiced.  Neurological:     Mental Status: She is alert.     Cranial Nerves: No dysarthria or facial asymmetry.     Motor: Weakness (RUE) present. No pronator drift.     Comments: Word finding difficulty   Psychiatric:        Mood and Affect: Mood normal.     ED Results / Procedures / Treatments   Labs (all labs ordered are listed, but only abnormal results are displayed) Labs Reviewed  CBC - Abnormal; Notable for the following components:      Result Value   Hemoglobin 11.6 (*)    All other components within normal limits  COMPREHENSIVE METABOLIC PANEL - Abnormal; Notable for the following components:   Glucose, Bld 181 (*)    BUN 26 (*)    Albumin 3.0 (*)    AST 14 (*)    Total Bilirubin 0.2 (*)    All other components within normal limits  LDL CHOLESTEROL, DIRECT - Abnormal; Notable for the following components:   Direct LDL 103 (*)    All other components within normal limits  HEMOGLOBIN A1C - Abnormal; Notable for the following components:   Hgb A1c MFr Bld 7.1 (*)    All other components within normal limits  I-STAT CHEM 8, ED - Abnormal; Notable for the following  components:   BUN 25 (*)    Glucose, Bld 175 (*)    Calcium, Ion 1.41 (*)    All other components within normal limits  CBG MONITORING, ED - Abnormal; Notable for the following components:   Glucose-Capillary 166 (*)    All other components within normal limits  RESP PANEL BY RT-PCR (FLU A&B, COVID) ARPGX2  ETHANOL  DIFFERENTIAL  PROTIME-INR  APTT    EKG EKG Interpretation  Date/Time:  Monday March 15 2022 18:46:46 EDT Ventricular Rate:  73 PR Interval:    QRS Duration: 89 QT Interval:  399 QTC Calculation: 440 R Axis:   22 Text Interpretation: Accelerated junctional rhythm Baseline wander in lead(s) V3 no STEMI Confirmed by Antony Blackbird [phone]) on 03/16/2022 1:18:02 PM  Radiology CT ANGIO HEAD NECK W WO CM  Result Date: 03/15/2022 CLINICAL DATA:  Altered mental status EXAM: CT ANGIOGRAPHY HEAD AND NECK TECHNIQUE: Multidetector CT imaging of the head and neck was performed using the standard protocol during bolus administration of intravenous contrast. Multiplanar CT image reconstructions and MIPs were obtained to evaluate the vascular anatomy. Carotid stenosis measurements (when applicable) are obtained utilizing NASCET criteria, using the distal internal carotid diameter as the denominator. RADIATION DOSE REDUCTION: This exam was performed according to the departmental dose-optimization program which includes automated exposure control, adjustment of the mA and/or kV according to patient size and/or use of iterative reconstruction technique. CONTRAST:  22mL OMNIPAQUE IOHEXOL 350 MG/ML SOLN COMPARISON:  No prior CTA, correlation is made with 03/15/2022 CT head and 09/04/2017 MRA head FINDINGS: CT HEAD FINDINGS For noncontrast findings, please see same day CT head. CTA NECK FINDINGS Aortic arch: Standard branching. Imaged portion shows no evidence of aneurysm or dissection. No significant stenosis of the major arch vessel origins. Right carotid system: Approximately 50% stenosis in  the proximal right ICA, secondary to calcified plaque. No dissection or occlusion.  Left carotid system: Approximately 50% stenosis in the proximal left ICA, secondary to calcified and noncalcified plaque. No dissection or occlusion. Vertebral arteries: No evidence of dissection, occlusion, or hemodynamically significant stenosis (greater than 50%). Skeleton: No acute osseous abnormality. Degenerative changes in the cervical spine. Other neck: No acute finding. Upper chest: Apical pleural-parenchymal scarring. No focal pulmonary opacity or pleural effusion. Review of the MIP images confirms the above findings CTA HEAD FINDINGS Anterior circulation: Both internal carotid arteries are patent to the termini, with mild stenosis in the bilateral cavernous and supraclinoid ICAs. A1 segments patent. Normal anterior communicating artery. Anterior cerebral arteries are patent to their distal aspects. No M1 stenosis or occlusion. MCA branches perfused and symmetric. Posterior circulation: Vertebral arteries patent to the vertebrobasilar junction without stenosis. Posterior inferior cerebellar arteries patent proximally. Basilar patent to its distal aspect. Superior cerebellar arteries patent proximally. Patent P1 segments. PCAs are somewhat irregular but perfused to their distal aspects without stenosis. The bilateral posterior communicating arteries are not visualized. Venous sinuses: As permitted by contrast timing, patent. Anatomic variants: None significant. Review of the MIP images confirms the above findings IMPRESSION: 1. No intracranial large vessel occlusion. Mild stenosis in the bilateral cavernous and supraclinoid ICAs. 2. Approximate 50% stenosis in the proximal bilateral ICAs. Code stroke imaging results were communicated on 03/15/2022 at 9:05 pm to provider [facility] via telephone, who verbally acknowledged these results. Electronically Signed   By: Merilyn Baba M.D.   On: 03/15/2022 21:06   CT HEAD CODE STROKE  WO CONTRAST  Result Date: 03/15/2022 CLINICAL DATA:  Code stroke.  Altered mental status. EXAM: CT HEAD WITHOUT CONTRAST TECHNIQUE: Contiguous axial images were obtained from the base of the skull through the vertex without intravenous contrast. RADIATION DOSE REDUCTION: This exam was performed according to the departmental dose-optimization program which includes automated exposure control, adjustment of the mA and/or kV according to patient size and/or use of iterative reconstruction technique. COMPARISON:  CT head 09/18/2020 FINDINGS: Brain: There is no acute intracranial hemorrhage, extra-axial fluid collection, or acute infarct There is mild background global parenchymal volume loss with prominence of the ventricular system and extra-axial CSF spaces, similar to the prior MRI from 2019. There is remote infarct in the left basal ganglia. Additional confluent hypodensity in the supratentorial white matter likely reflects sequela of underlying chronic white matter microangiopathy. There is no mass lesion.  There is no mass effect or midline shift. Vascular: There is calcification of the bilateral cavernous ICAs. Skull: Normal. Negative for fracture or focal lesion. Sinuses/Orbits: The imaged paranasal sinuses are clear. Bilateral lens implants are in place. The globes and orbits are otherwise unremarkable. Other: None. ASPECTS [facility] Stroke Program Early CT Score) - Ganglionic level infarction (caudate, lentiform nuclei, internal capsule, insula, M1-M3 cortex): 7 - Supraganglionic infarction (M4-M6 cortex): 3 Total score (0-10 with 10 being normal): 10 IMPRESSION: 1. No acute intracranial pathology. 2. ASPECTS is 10 These results were called by telephone at the time of interpretation on 03/15/2022 at 6:46 pm to provider Godfrey Pick , who verbally acknowledged these results. Electronically Signed   By: Valetta Mole M.D.   On: 03/15/2022 18:49    Procedures Procedures    Medications Ordered in  ED Medications  iohexol (OMNIPAQUE) 350 MG/ML injection 100 mL (75 mLs Intravenous Contrast Given 03/15/22 1956)    ED Course/ Medical Decision Making/ A&P  Medical Decision Making Amount and/or Complexity of Data Reviewed Labs: ordered. Radiology: ordered.  Risk Prescription drug management.   This patient presents to the ED for concern of strokelike symptoms, this involves an extensive number of treatment options, and is a complaint that carries with it a high risk of complications and morbidity.  The differential diagnosis includes CVA, TIA, ICH, hypoglycemia, seizure, migraine   Co morbidities that complicate the patient evaluation  anemia, DM 2, retinopathy, HLD, GERD, HTN, migraine headaches, TIA, CKD   Additional history obtained:  Additional history obtained from EMS, patient's family External records from outside source obtained and reviewed including EMR   Lab Tests:  I Ordered, and personally interpreted labs.  The pertinent results include: Baseline anemia, no leukocytosis, normal electrolytes   Imaging Studies ordered:  I ordered imaging studies including CT head, CTA head and neck I independently visualized and interpreted imaging which showed no acute findings I agree with the radiologist interpretation   Cardiac Monitoring: / EKG:  The patient was maintained on a cardiac monitor.  I personally viewed and interpreted the cardiac monitored which showed an underlying rhythm of: Sinus rhythm   Consultations Obtained:  I requested consultation with the neurologist, Dr. Quinn Axe,  and discussed lab and imaging findings as well as pertinent plan - they recommend: Admission for CVA/seizure work-up   Problem List / ED Course / Critical interventions / Medication management  Patient is a pleasant 87 year old female presenting from home for cute onset of strokelike symptoms.  Family describes a garbled speech, facial asymmetry, and  right hemibody weakness.  EMS noted similar findings on scene, however, upon arrival, patient has resolution of any facial asymmetry.  She does not have any significantly slurred speech.  She does seem to have word finding difficulty.  She has weakness in both upper extremities that is greater on the right.  I do not appreciate any lower extremity weakness.  Given the history provided by EMS, the timeline, and evidence of continued neurologic deficits on arrival, code stroke was initiated.  Patient underwent CT imaging which did not show any evidence of CVA.  Patient was evaluated by neurologist, Dr. Quinn Axe, who recommends admission for further work-up.  Hospitalist was consulted for admission.  While in the ED, patient had further resolution of symptoms.  Family confirms that she is at her mental and physical baseline.  Following discussions between patient, family, and hospitalist, patient and family elected for discharge home with no further work-up.  Patient was reportedly previously on hospice and does not desire any further work-up or initiation of medications.  She apparently has had discussions in the past regarding AED medications.  Patient and family declined this stating that patient does not want any medications that might cloud her sensorium.  I confirmed desire for discharge home with patient and her family.  They plan to pursue hospice care.  Patient was discharged in accordance with her wishes.         Final Clinical Impression(s) / ED Diagnoses Final diagnoses:  TIA (transient ischemic attack)    Rx / DC Orders ED Discharge Orders     None         Godfrey Pick, MD 03/16/22 1503

## 2022-03-15 NOTE — Consult Note (Addendum)
Medical Consultation   Rebecca Perkins  BBC:[id number]  DOB: [birth year]  DOA: 03/15/2022  PCP: [name], MD     Requesting physician: Dr. Doren Custard  Reason for consultation: Evaluation and possible inpatient treatment   History of Present Illness: Rebecca Perkins is an 88 y.o. female was on hospice for dementia and downgraded to palliative care.  She has a past medical history for anemia, diabetes mellitus type 2, retinopathy, hyperlipidemia, GERD, hypertension, TIAs, migraine headaches, and chronic kidney disease.  Over the past 4 to 6 weeks has had progressive worsening of her chronic gait disturbances and has been slightly more confused than usual.  This frequently fluctuates.  At 1745 today she had an acute change where she began staring ahead with decreased responsiveness.  He has had an episode like this before and it was discussed with the PCP.  At that time family decided against using any antiseizure medicine as it may decrease the patient's ability to be alert and awake.  That episode lasted approximately 40 minutes and afterwards she was jumbling her words and had right-sided weakness which improved but did not 100% resolved by the time she saw neurology.  Patient presented outside of the window for thrombolytic therapy.  In the emergency department a code stroke was called and her CT scan was unremarkable.  She was seen by neurology who recommended admission, permissive hypertension, MRI of the brain, CTA of the head and neck, transthoracic echocardiogram, EEG, and additional blood work.  When I spoke with the patient's 2 sons they tell me that the patient had been on hospice but had been downgraded to palliative care.  They feel that her change in function over the past weeks would requalify her for hospice care and are interested in pursuing hospice care.  They reported that they had spoken with her primary care physician and had decided against antiseizure  medicines.  They told me their goal is to keep her comfortable and happy at home.    Review of Systems:  ROS As per HPI otherwise 10 point review of systems negative.     Past Medical History: Past Medical History:  Diagnosis Date   Anemia    Diabetes mellitus type 2, insulin dependent (HCC)    Diabetic retinopathy ([place])    NPDR OU   Dyslipidemia    GERD (gastroesophageal reflux disease)    Hypertension    Hypertensive retinopathy    OU   Migraines    Osteoporosis    Pneumonia    PVD (peripheral vascular disease) ([place])    abi .83 (L), .92 (R)   RSD (reflex sympathetic dystrophy) 2007   R wrist/hand following fx   Stroke ([place])    TIA (transient ischemic attack)    Varicose veins     Past Surgical History: Past Surgical History:  Procedure Laterality Date   [place]   BREAST CYST EXCISION     CATARACT EXTRACTION Bilateral 10/2009   CHOLECYSTECTOMY  1989   EYE SURGERY Bilateral    Cat Sx OU   Several benign cyst removed     last 1 in [place]       Allergies:   Allergies  Allergen Reactions   Lipitor [Atorvastatin Calcium] Diarrhea   Niacin And Related Hives   Statins Diarrhea   Macrobid [  Nitrofurantoin Macrocrystal] Other (See Comments)    unknown     Social History:  reports that she has never smoked. She has never used smokeless tobacco. She reports that she does not drink alcohol and does not use drugs.   Family History: Family History  Problem Relation Age of Onset   Stroke Mother 25   Hypertension Mother    Clotting disorder Father    Heart attack Father    Arrhythmia Sister    Stroke Brother    Thyroid disease Neg Hx     Family history reviewed and as above.  Physical Exam: Vitals:   03/15/22 1856 03/15/22 1857 03/15/22 1900 03/15/22 1930  BP:   (!) 131/55 (!) 125/55  Pulse:   72 76  Resp: '17 10 18 '$ (!) 21  Temp:      SpO2:   99% 99%     Constitutional: Alert and awake, oriented x1, not in any acute distress. Eyes: PERLA, EOMI, irises appear normal, anicteric sclera,  ENMT: external ears and nose appear normal,             Lips appears normal, oropharynx mucosa, tongue, posterior pharynx appear normal  Neck: neck appears normal, no masses, normal ROM, no thyromegaly, no JVD  CVS: S1-S2 clear, no murmur rubs or gallops, no LE edema, normal pedal pulses  Respiratory:  clear to auscultation bilaterally, no wheezing, rales or rhonchi. Respiratory effort normal. No accessory muscle use.  Abdomen: soft nontender, nondistended, normal bowel sounds, no hepatosplenomegaly, no hernias  Musculoskeletal: : no cyanosis, clubbing or edema noted bilaterally                     Neuro: Cranial nerves II-XII intact, strength, sensation, and reflexes at baseline Psych: judgement and insight appear poor stable mood and affect, mental status alert and awake Skin: no rashes or lesions or ulcers, no induration or nodules     Data reviewed:  I have personally reviewed following labs and imaging studies Labs:  CBC: Recent Labs  Lab 03/15/22 1834 03/15/22 1837  WBC 5.8  --   NEUTROABS 2.8  --   HGB 11.6* 12.6  HCT 36.6 37.0  MCV 89.7  --   PLT 214  --     Basic Metabolic Panel: Recent Labs  Lab 03/15/22 1834 03/15/22 1837  NA 139 140  K 4.1 4.1  CL 105 102  CO2 29  --   GLUCOSE 181* 175*  BUN 26* 25*  CREATININE 0.78 0.80  CALCIUM 10.0  --    GFR CrCl cannot be calculated (Unknown ideal weight.). Liver Function Tests: Recent Labs  Lab 03/15/22 1834  AST 14*  ALT 12  ALKPHOS 80  BILITOT 0.2*  PROT 7.1  ALBUMIN 3.0*    CBG: Recent Labs  Lab 03/15/22 1834  GLUCAP 166*   Urinalysis    Component Value Date/Time   COLORURINE YELLOW 11/27/2021 1330   APPEARANCEUR SLIGHTLY CLOUDY (A) 11/27/2021 1330   APPEARANCEUR Clear 02/13/2018 1336   LABSPEC 1.015 11/27/2021 1330   PHURINE 5.0 11/27/2021 1330    GLUCOSEU TRACE (A) 11/27/2021 1330   GLUCOSEU NEGATIVE 02/10/2016 0946   HGBUR NEGATIVE 11/27/2021 1330   BILIRUBINUR negative 02/20/2022 1314   BILIRUBINUR Negative 02/13/2018 1336   KETONESUR negative 02/20/2022 1314   KETONESUR TRACE (A) 11/27/2021 1330   PROTEINUR negative 02/20/2022 1314   PROTEINUR NEGATIVE 11/27/2021 1330   UROBILINOGEN 0.2 02/20/2022 1314   UROBILINOGEN 0.2 02/10/2016 0946   NITRITE Negative  02/20/2022 1314   NITRITE POSITIVE (A) 11/27/2021 1330   LEUKOCYTESUR Trace (A) 02/20/2022 1314   LEUKOCYTESUR 1+ (A) 11/27/2021 1330     Microbiology No results found for this or any previous visit (from the past 240 hour(s)).     Inpatient Medications:   Scheduled Meds:  [START ON 03/16/2022] aspirin  81 mg Oral Daily   [START ON 03/16/2022] clopidogrel  75 mg Oral Daily   denosumab  60 mg Subcutaneous Q6 months   Continuous Infusions:   Radiological Exams on Admission: CT HEAD CODE STROKE WO CONTRAST  Result Date: 03/15/2022 CLINICAL DATA:  Code stroke.  Altered mental status. EXAM: CT HEAD WITHOUT CONTRAST TECHNIQUE: Contiguous axial images were obtained from the base of the skull through the vertex without intravenous contrast. RADIATION DOSE REDUCTION: This exam was performed according to the departmental dose-optimization program which includes automated exposure control, adjustment of the mA and/or kV according to patient size and/or use of iterative reconstruction technique. COMPARISON:  CT head 09/18/2020 FINDINGS: Brain: There is no acute intracranial hemorrhage, extra-axial fluid collection, or acute infarct There is mild background global parenchymal volume loss with prominence of the ventricular system and extra-axial CSF spaces, similar to the prior MRI from 2019. There is remote infarct in the left basal ganglia. Additional confluent hypodensity in the supratentorial white matter likely reflects sequela of underlying chronic white matter microangiopathy.  There is no mass lesion.  There is no mass effect or midline shift. Vascular: There is calcification of the bilateral cavernous ICAs. Skull: Normal. Negative for fracture or focal lesion. Sinuses/Orbits: The imaged paranasal sinuses are clear. Bilateral lens implants are in place. The globes and orbits are otherwise unremarkable. Other: None. ASPECTS [facility] Stroke Program Early CT Score) - Ganglionic level infarction (caudate, lentiform nuclei, internal capsule, insula, M1-M3 cortex): 7 - Supraganglionic infarction (M4-M6 cortex): 3 Total score (0-10 with 10 being normal): 10 IMPRESSION: 1. No acute intracranial pathology. 2. ASPECTS is 10 These results were called by telephone at the time of interpretation on 03/15/2022 at 6:46 pm to provider Godfrey Pick , who verbally acknowledged these results. Electronically Signed   By: Valetta Mole M.D.   On: 03/15/2022 18:49    Impression/Recommendations Active Problems:   TIA (transient ischemic attack)   1.  TIA versus seizure: Patient is already taking Plavix and Crestor.  Family is not interested in adding any antiseizure medicines and therefore not interested in pursuing an EEG at this time.  CTA reviewed with radiology and no actionable lesions noted.  2.  Continuing decline: Family notes decrease in patient's ability to ambulate and a new problem with incontinence.  They would like her reevaluated by hospice.  I called [facility] and spoke with the triage nurse who me in touch with the on-call nurse.  [facility] will reach out to family tomorrow to set up an appointment.  Thank you for this consultation.  Pt is stable to discharge home with family care  Time Spent: 24 minutes  Lady Deutscher M.D. FACP [facility] 03/15/2022, 8:21 PM       For on call review [email or web address].

## 2022-03-15 NOTE — Discharge Instructions (Signed)
[facility] will reach out to you tomorrow to set up an appointment.

## 2022-03-15 NOTE — ED Triage Notes (Signed)
Per son, patient is a Hospice patient.

## 2022-03-15 NOTE — ED Triage Notes (Signed)
Patient BIB EMS with complaints of right sided facial droop and right sided weakness with LKWT at 1730. EMS reports that facial droop has improved. Code stroke activated in field.

## 2022-03-15 NOTE — Consult Note (Signed)
NEUROLOGY TELECONSULTATION NOTE   Date of service: March 15, 2022 Patient Name: Rebecca Perkins MRN:  [id number] DOB:  12/20/1928 Reason for consult: stroke code  Requesting Provider: Dr. Godfrey Pick Consult Participants: myself, patient, bedside RN, telestroke RN Location of the provider: [place], [place] Location of the patient: [facility]  This consult was provided via telemedicine with 2-way video and audio communication. The patient/family was informed that care would be provided in this way and agreed to receive care in this manner.   _ _ _   _ __   _ __ _ _  __ __   _ __   __ _  History of Present Illness    88 yo woman with pmhx DM2, HL, prior TIAs, dementia who presents with acute onset R sided weakness in face/arm/leg. LKW 1745. Sx improved but not resolved by time of my exam. Head CT wo NAICP with ASPECTS 10 (personal review). NIHSS = 9. Per family at baseline she has moderate-to-severe dementia, is typically only oriented to self and family, requires caregivers for ADLs, and is unable to walk without assistance. Over the past 2 weeks she has had acute progressive worsening of her chronic gait disturbance and has seemed slightly more confused than usual (although this fluctuates) therefore LKW was unclear. There was another acute change today at 1745 when patient began staring ahead with decreased responsiveness. Afterwards she was jumbling her words and had R sided weakness which had improved but not resolved by the time of my examination. NIHSS = 9. TNK was not administered 2/2 presentation outside the window. Exam was not c/w LVO therefore vascular imaging was not performed as part of the stroke code.   ROS   UTA 2/2 baseline dementia  Past History   The following was personally reviewed:  Past Medical History:  Diagnosis Date   Anemia    Diabetes mellitus type 2, insulin dependent (HCC)    Diabetic retinopathy (HCC)    NPDR OU   Dyslipidemia    GERD (gastroesophageal  reflux disease)    Hypertension    Hypertensive retinopathy    OU   Migraines    Osteoporosis    Pneumonia    PVD (peripheral vascular disease) ([place])    abi .83 (L), .92 (R)   RSD (reflex sympathetic dystrophy) 2007   R wrist/hand following fx   Stroke ([place])    TIA (transient ischemic attack)    Varicose veins    Past Surgical History:  Procedure Laterality Date   [place]   BREAST CYST EXCISION     CATARACT EXTRACTION Bilateral 10/2009   CHOLECYSTECTOMY  1989   EYE SURGERY Bilateral    Cat Sx OU   Several benign cyst removed     last 1 in [place]     Family History  Problem Relation Age of Onset   Stroke Mother 39   Hypertension Mother    Clotting disorder Father    Heart attack Father    Arrhythmia Sister    Stroke Brother    Thyroid disease Neg Hx    Social History   Socioeconomic History   Marital status: Widowed    Spouse name: Not on file   Number of children: 3   Years of education: Not on file   Highest education level: Not on file  Occupational History   Occupation: Retired  Tobacco Use  Smoking status: Never   Smokeless tobacco: Never  Vaping Use   Vaping Use: Never used  Substance and Sexual Activity   Alcohol use: No   Drug use: No   Sexual activity: Not Currently  Other Topics Concern   Not on file  Social History Narrative   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   Married , lives with spouse of 26 y (03/2011)   Social Determinants of Health   Financial Resource Strain: Not on file  Food Insecurity: Not on file  Transportation Needs: Not on file  Physical Activity: Not on file  Stress: Not on file  Social Connections: Not on file   Allergies  Allergen Reactions   Lipitor [Atorvastatin Calcium] Diarrhea   Niacin And Related Hives   Statins Diarrhea   Macrobid [Nitrofurantoin Macrocrystal] Other (See  Comments)    unknown    Medications   (Not in a hospital admission)     Current Facility-Administered Medications:    denosumab (PROLIA) injection 60 mg, 60 mg, Subcutaneous, Q6 months, Pickard, Warren T, MD, 60 mg at 11/04/20 1409  Current Outpatient Medications:    albuterol (VENTOLIN HFA) 108 (90 Base) MCG/ACT inhaler, Inhale 1-2 puffs into the lungs in the morning and at bedtime., Disp: 8 g, Rfl: 11   amoxicillin-clavulanate (AUGMENTIN) 875-125 MG tablet, Take 1 tablet by mouth every 12 (twelve) hours., Disp: 14 tablet, Rfl: 0   Blood Glucose Monitoring Suppl (ONE TOUCH ULTRA SYSTEM KIT) W/DEVICE KIT, 1 kit by Does not apply route once. Tests Blood sugar before meals and at bedtime, Disp: 1 each, Rfl: 0   Calcium Carbonate-Vitamin D (CALTRATE 600+D PO), Take by mouth 2 (two) times daily., Disp: , Rfl:    cephALEXin (KEFLEX) 500 MG capsule, Take 1 capsule (500 mg total) by mouth 3 (three) times daily., Disp: 21 capsule, Rfl: 0   clopidogrel (PLAVIX) 75 MG tablet, TAKE 1 TABLET BY MOUTH EVERY DAY, Disp: 90 tablet, Rfl: 3   Coenzyme Q10 (COQ10 PO), Take by mouth., Disp: , Rfl:    Continuous Blood Gluc Sensor (FREESTYLE LIBRE 14 DAY SENSOR) MISC, USE EVERY 14 DAYS, Disp: 6 each, Rfl: 3   denosumab (PROLIA) 60 MG/ML SOSY injection, Inject into the skin every 6 (six) months. , Disp: , Rfl:    donepezil (ARICEPT) 10 MG tablet, TAKE 1 TABLET BY MOUTH EVERYDAY AT BEDTIME, Disp: 90 tablet, Rfl: 3   escitalopram (LEXAPRO) 10 MG tablet, TAKE 1 TABLET BY MOUTH EVERY DAY, Disp: 90 tablet, Rfl: 1   fenofibrate 160 MG tablet, TAKE 1 TABLET BY MOUTH EVERY DAY, Disp: 90 tablet, Rfl: 3   fluticasone (FLONASE) 50 MCG/ACT nasal spray, SPRAY 2 SPRAYS INTO EACH NOSTRIL EVERY DAY, Disp: 48 mL, Rfl: 2   hydrochlorothiazide (MICROZIDE) 12.5 MG capsule, Take 1 capsule (12.5MG) by mouth daily., Disp: 90 capsule, Rfl: 1   icosapent Ethyl (VASCEPA) 1 g capsule, TAKE 2 CAPSULES BY MOUTH 2 TIMES DAILY., Disp: 120  capsule, Rfl: 11   insulin lispro (HUMALOG) 100 UNIT/ML KwikPen, Inject SQ QD as directed per sliding scale-  <60 call MD, 100-150 0U, 151-200 2U, 201- 250 4U, 251- 300 6U, 301- 350 8U, 351- 400 10U 401- 450 12U,  >450 15U call MD, Disp: 15 mL, Rfl: 3   Insulin Pen Needle (B-D ULTRAFINE III SHORT PEN) 31G X 8 MM MISC, INJECT AS DIRECTED 4 TIMES DAILY, Disp: 300 each, Rfl: 2   INSULIN SYRINGE 1CC/29G 29G X 1/2" 1  ML MISC, AS DIRECTED., Disp: 100 each, Rfl: 3   KLOR-CON M20 20 MEQ tablet, TAKE 1 TABLET BY MOUTH EVERY DAY, Disp: 90 tablet, Rfl: 2   levETIRAcetam (KEPPRA) 500 MG tablet, TAKE 1 TABLET BY MOUTH TWICE A DAY, Disp: 180 tablet, Rfl: 0   losartan (COZAAR) 100 MG tablet, TAKE 1 TABLET BY MOUTH EVERY DAY, Disp: 90 tablet, Rfl: 3   metoprolol succinate (TOPROL-XL) 50 MG 24 hr tablet, TAKE 1 TABLET BY MOUTH EVERY DAY WITH OR IMMEDIATELY FOLLOWING A MEAL, Disp: 90 tablet, Rfl: 0   Multiple Vitamin (MULTIVITAMIN) tablet, Take 1 tablet by mouth daily., Disp: , Rfl:    NONFORMULARY OR COMPOUNDED ITEM, [facility]:  Peripheral Neuropathy Cream - Bupivacaine 1%, Doxepin 3%, Gabapentin 6%, Pentoxifylline 3%, Topiramate 1%, apply 1-2 grams to affected ares 3-4 times a day prn. (Patient taking differently: [facility]:  Peripheral Neuropathy Cream - Bupivacaine 1%, Doxepin 3%, Gabapentin 6%, Pentoxifylline 3%, Topiramate 1%, apply 1-2 grams to affected ares 3-4 times a day prn.), Disp: 100 each, Rfl: 5   ONE TOUCH ULTRA TEST test strip, USE FOUR TIMES DAILY BEFORE MEALS AND EVERY NIGHT AT BEDTIME, Disp: 150 each, Rfl: 5   pantoprazole (PROTONIX) 40 MG tablet, TAKE 1 TABLET BY MOUTH EVERY DAY, Disp: 90 tablet, Rfl: 1   pioglitazone (ACTOS) 30 MG tablet, TAKE 1 TABLET BY MOUTH EVERY DAY, Disp: 90 tablet, Rfl: 3   polyvinyl alcohol (LIQUIFILM TEARS) 1.4 % ophthalmic solution, 1 drop as needed for dry eyes., Disp: , Rfl:    Pyridoxine HCl (VITAMIN B-6) 100 MG tablet, Take 100 mg by mouth daily.,  Disp: , Rfl:    rosuvastatin (CRESTOR) 20 MG tablet, Take 20 mg by mouth daily., Disp: , Rfl:    sulfamethoxazole-trimethoprim (BACTRIM DS) 800-160 MG tablet, Take 1 tablet by mouth 2 (two) times daily., Disp: 14 tablet, Rfl: 0   TRULICITY 1.5 JQ/7.3AL SOPN, INJECT 1.5MG UNDER THE SKIN ONCE A WEEK, Disp: 2 mL, Rfl: 3  Vitals   Vitals:   03/15/22 1837 03/15/22 1838  Pulse: 72 73  SpO2: 98% 100%     There is no height or weight on file to calculate BMI.  Physical Exam   Exam performed over telemedicine with 2-way video and audio communication and with assistance of bedside RN  Physical Exam Gen: alert, oriented to self and family, NAD Resp: normal WOB CV: extremities appear well-perfused  Neuro: *MS: alert, oriented to self and family, able to follow most simple commands but requires some prompting *Speech: mild dysarthria, able to name 5/6 objects, repetition intact *CN: PERRL 43m, EOMI, VFF by confrontation, sensation intact, mild R NLF flattening, hearing moderately impaired to voice *Motor:   Normal bulk.  No tremor, rigidity or bradykinesia. No drift LUE, subtle drift RUE, some movement against gravity BUE (symmetric) *Sensory: SILT. Symmetric. No double-simultaneous extinction.  *Coordination:  FNF bilat with dysmetria but not frank ataxia *Reflexes:  UTA 2/2 tele-exam *Gait: deferred  NIHSS  1a Level of Conscious.: 0 1b LOC Questions: 1 1c LOC Commands: 0 2 Best Gaze: 0 3 Visual: 0 4 Facial Palsy: 1 5a Motor Arm - left: 0 5b Motor Arm - Right: 1 6a Motor Leg - Left: 2 6b Motor Leg - Right: 2 7 Limb Ataxia: 0 8 Sensory: 0 9 Best Language: 1 10 Dysarthria: 1 11 Extinct. and Inatten.: 0  TOTAL: 9   Premorbid mRS = 4   Labs   CBC:  Recent Labs  Lab 03/15/22  1837  HGB 12.6  HCT 54.5    Basic Metabolic Panel:  Lab Results  Component Value Date   NA 140 03/15/2022   K 4.1 03/15/2022   CO2 26 06/10/2021   GLUCOSE 175 (H) 03/15/2022   BUN 25 (H)  03/15/2022   CREATININE 0.80 03/15/2022   CALCIUM 10.0 06/10/2021   GFRNONAA 49 (L) 06/10/2021   GFRAA 43 (L) 11/11/2020   Lipid Panel:  Lab Results  Component Value Date   LDLCALC 73 03/27/2021   HgbA1c:  Lab Results  Component Value Date   HGBA1C 6.9 (H) 03/27/2021   Urine Drug Screen:     Component Value Date/Time   LABOPIA NONE DETECTED 06/18/2015 0712   COCAINSCRNUR NONE DETECTED 06/18/2015 0712   LABBENZ NONE DETECTED 06/18/2015 0712   AMPHETMU NONE DETECTED 06/18/2015 0712   THCU NONE DETECTED 06/18/2015 0712   LABBARB NONE DETECTED 06/18/2015 0712    Alcohol Level     Component Value Date/Time   ETH <5 06/17/2015 2140     Impression   88 yo woman with pmhx DM2, HL, prior TIAs, dementia who presents with acute onset R sided weakness in face/arm/leg. LKW 1745. Sx improved but not resolved by time of my exam. Head CT wo NAICP with ASPECTS 10 (personal review). NIHSS = 9. Per family at baseline she has moderate-to-severe dementia, is typically only oriented to self and family, requires caregivers for ADLs, and is unable to walk without assistance. TNK was not administered 2/2 presentation outside the window. Ddx includes ischemic stroke, TIA, or possible seizure with post-ictal confusion and Todd's paralysis. She has no known personal hx seizure.  Recommendations   - Admit for stroke workup (APA is fine) - Permissive HTN x48 hrs or until stroke ruled out by MRI goal BP <220/110. PRN labetalol or hydralazine if BP above these parameters. Avoid oral antihypertensives. - MRI brain with and without contrast (w/wo contrast since seizure is on the differential) - CTA H&N - TTE  - rEEG - Check A1c and LDL + add statin per guidelines (please clarify pt's listed statin intolerance before, and order if appropriate - ASA 74m daily + plavix 791mdaily x21 days f/b plavix 7548maily monotherapy after that - q4 hr neuro checks - STAT head CT for any change in neuro exam -  Tele - PT/OT/SLP - Stroke education - Amb referral to neurology upon discharge   If further assistance from neurology is needed after today, please place routine consult to teleneurologist Dr. YadHortense Ramaln [facility] is available 8am-4pm M-F.  ______________________________________________________________________   Thank you for the opportunity to take part in the care of this patient. If you have any further questions, please contact the neurology consultation attending.  Signed,  ColSu MonksD [facility] [phone] 7pm- 7am, please page neurology on call as listed in [facility]

## 2022-03-15 NOTE — ED Notes (Signed)
Patient transported to CT 

## 2022-03-15 NOTE — Progress Notes (Signed)
Code stroke activated per elert @ 8677, pre alert for EMS patient.  ERP at bedside on arrival to assess patient, taken to CT @ 1836. Dr Quinn Axe paged @ [phone], pt returned from [place].  Dr Quinn Axe on camera to assess patient @ 1845.

## 2022-03-15 NOTE — ED Notes (Signed)
EMS called Code STROKE @ 1824, called and beeped to CT and Lab.

## 2022-03-16 ENCOUNTER — Telehealth: Payer: Self-pay

## 2022-03-16 LAB — LDL CHOLESTEROL, DIRECT: Direct LDL: 103 mg/dL — ABNORMAL HIGH (ref 0–99)

## 2022-03-16 NOTE — Telephone Encounter (Signed)
Spoke w/Rebecca Perkins w/[facility] referrals dept.   For Hospice orders,   per Dr. Dennard Schaumann, I will be glad to help in any way.  I will defer to [facility] to be attending while on hospice.  But if there is anything I need to do, I will be happy to do.  I believe she has less than 6 months.    Rebecca Perkins voiced understanding. Nothing at this time.

## 2022-03-16 NOTE — Telephone Encounter (Signed)
Amber w/[facility] referrals  dept [phone]  Family Requesting Hospice order referrals? Would you agree to be attending physcian, per family request? 2.   Do you think that pt has 6 moss or less to live, per family pt is very sick. 3.   Do you want to manage comfort and symptoms or do  want [facility] to mange?   Pls advice

## 2022-03-25 ENCOUNTER — Other Ambulatory Visit: Payer: Self-pay | Admitting: Family Medicine

## 2022-03-25 NOTE — Progress Notes (Signed)
[facility] Note  03/31/2022     CHIEF COMPLAINT Patient presents for Retina Follow Up    HISTORY OF PRESENT ILLNESS: Rebecca Perkins is a 88 y.o. female who presents to the clinic today for:   HPI     Retina Follow Up   Patient presents with  Diabetic Retinopathy.  In both eyes.  Severity is moderate.  Duration of 5 months.  Since onset it is stable.  I, the attending physician,  performed the HPI with the patient and updated documentation appropriately.        Comments   Patient states vision the same OU. Last a1c was 7.1, checked 08.14.23.       Last edited by Bernarda Caffey, MD on 03/31/2022  1:35 PM.    Pt states no change in vision, pt is under hospice care right now, pt is on Trulicity for diabetes, pts son states her PCP has said that as long as her blood sugar is under 300 she is doing okay   Referring physician: [name], MD [address] [place],  [place] [address]  HISTORICAL INFORMATION:   Selected notes from the MEDICAL RECORD NUMBER Referred by Dr. Luberta Mutter for concern of DME   CURRENT MEDICATIONS: Current Outpatient Medications (Ophthalmic Drugs)  Medication Sig   polyvinyl alcohol (LIQUIFILM TEARS) 1.4 % ophthalmic solution 1 drop as needed for dry eyes.   No current facility-administered medications for this visit. (Ophthalmic Drugs)   Current Outpatient Medications (Other)  Medication Sig   albuterol (VENTOLIN HFA) 108 (90 Base) MCG/ACT inhaler Inhale 1-2 puffs into the lungs in the morning and at bedtime.   amoxicillin-clavulanate (AUGMENTIN) 875-125 MG tablet Take 1 tablet by mouth every 12 (twelve) hours.   Blood Glucose Monitoring Suppl (ONE TOUCH ULTRA SYSTEM KIT) W/DEVICE KIT 1 kit by Does not apply route once. Tests Blood sugar before meals and at bedtime   Calcium Carbonate-Vitamin D (CALTRATE 600+D PO) Take by mouth 2 (two) times daily.   cephALEXin (KEFLEX) 500 MG capsule Take 1 capsule  (500 mg total) by mouth 3 (three) times daily.   clopidogrel (PLAVIX) 75 MG tablet TAKE 1 TABLET BY MOUTH EVERY DAY   Coenzyme Q10 (COQ10 PO) Take by mouth.   Continuous Blood Gluc Sensor (FREESTYLE LIBRE 14 DAY SENSOR) MISC USE EVERY 14 DAYS   denosumab (PROLIA) 60 MG/ML SOSY injection Inject into the skin every 6 (six) months.    donepezil (ARICEPT) 10 MG tablet TAKE 1 TABLET BY MOUTH EVERYDAY AT BEDTIME   escitalopram (LEXAPRO) 10 MG tablet TAKE 1 TABLET BY MOUTH EVERY DAY   fenofibrate 160 MG tablet TAKE 1 TABLET BY MOUTH EVERY DAY   fluticasone (FLONASE) 50 MCG/ACT nasal spray SPRAY 2 SPRAYS INTO EACH NOSTRIL EVERY DAY   hydrochlorothiazide (MICROZIDE) 12.5 MG capsule Take 1 capsule (12.$RemoveBefor'5MG'leIiVChGWxsq$ ) by mouth daily.   icosapent Ethyl (VASCEPA) 1 g capsule TAKE 2 CAPSULES BY MOUTH 2 TIMES DAILY.   insulin lispro (HUMALOG) 100 UNIT/ML KwikPen Inject SQ QD as directed per sliding scale-  <60 call MD, 100-150 0U, 151-200 2U, 201- 250 4U, 251- 300 6U, 301- 350 8U, 351- 400 10U 401- 450 12U,  >450 15U call MD   Insulin Pen Needle (B-D ULTRAFINE III SHORT PEN) 31G X 8 MM MISC INJECT AS DIRECTED 4 TIMES DAILY   INSULIN SYRINGE 1CC/29G 29G X 1/2" 1 ML MISC AS DIRECTED.   KLOR-CON M20 20 MEQ tablet TAKE  1 TABLET BY MOUTH EVERY DAY   levETIRAcetam (KEPPRA) 500 MG tablet TAKE 1 TABLET BY MOUTH TWICE A DAY   losartan (COZAAR) 100 MG tablet TAKE 1 TABLET BY MOUTH EVERY DAY   metoprolol succinate (TOPROL-XL) 50 MG 24 hr tablet TAKE 1 TABLET BY MOUTH EVERY DAY WITH OR IMMEDIATELY FOLLOWING A MEAL   Multiple Vitamin (MULTIVITAMIN) tablet Take 1 tablet by mouth daily.   NONFORMULARY OR COMPOUNDED ITEM [facility]:  Peripheral Neuropathy Cream - Bupivacaine 1%, Doxepin 3%, Gabapentin 6%, Pentoxifylline 3%, Topiramate 1%, apply 1-2 grams to affected ares 3-4 times a day prn. (Patient taking differently: [facility]:  Peripheral Neuropathy Cream - Bupivacaine 1%, Doxepin 3%, Gabapentin 6%, Pentoxifylline  3%, Topiramate 1%, apply 1-2 grams to affected ares 3-4 times a day prn.)   ONE TOUCH ULTRA TEST test strip USE FOUR TIMES DAILY BEFORE MEALS AND EVERY NIGHT AT BEDTIME   pantoprazole (PROTONIX) 40 MG tablet TAKE 1 TABLET BY MOUTH EVERY DAY   pioglitazone (ACTOS) 30 MG tablet TAKE 1 TABLET BY MOUTH EVERY DAY   Pyridoxine HCl (VITAMIN B-6) 100 MG tablet Take 100 mg by mouth daily.   rosuvastatin (CRESTOR) 20 MG tablet Take 20 mg by mouth daily.   sulfamethoxazole-trimethoprim (BACTRIM DS) 800-160 MG tablet Take 1 tablet by mouth 2 (two) times daily.   TRULICITY 1.5 ZO/1.0RU SOPN INJECT 1.$RemoveBefor'5MG'qOCuHECVoHhD$  UNDER THE SKIN ONCE A WEEK   Current Facility-Administered Medications (Other)  Medication Route   denosumab (PROLIA) injection 60 mg Subcutaneous   REVIEW OF SYSTEMS: ROS   Positive for: Gastrointestinal, Neurological, Genitourinary, Musculoskeletal, HENT, Endocrine, Eyes Negative for: Constitutional, Skin, Cardiovascular, Respiratory, Psychiatric, Allergic/Imm, Heme/Lymph Last edited by Jobe Marker, COT on 03/31/2022  1:06 PM.     ALLERGIES Allergies  Allergen Reactions   Lipitor [Atorvastatin Calcium] Diarrhea   Niacin And Related Hives   Statins Diarrhea   Macrobid [Nitrofurantoin Macrocrystal] Other (See Comments)    unknown   PAST MEDICAL HISTORY Past Medical History:  Diagnosis Date   Anemia    Diabetes mellitus type 2, insulin dependent (HCC)    Diabetic retinopathy (HCC)    NPDR OU   Dyslipidemia    GERD (gastroesophageal reflux disease)    Hypertension    Hypertensive retinopathy    OU   Migraines    Osteoporosis    Pneumonia    PVD (peripheral vascular disease) ([place])    abi .83 (L), .92 (R)   RSD (reflex sympathetic dystrophy) 2007   R wrist/hand following fx   Stroke ([place])    TIA (transient ischemic attack)    Varicose veins    Past Surgical History:  Procedure Laterality Date   ABDOMINAL HYSTERECTOMY  1988   APPENDECTOMY  1966   BREAST CYST EXCISION      CATARACT EXTRACTION Bilateral 10/2009   CHOLECYSTECTOMY  1989   EYE SURGERY Bilateral    Cat Sx OU   Several benign cyst removed     last 1 in [place] HISTORY Family History  Problem Relation Age of Onset   Stroke Mother 57   Hypertension Mother    Clotting disorder Father    Heart attack Father    Arrhythmia Sister    Stroke Brother    Thyroid disease Neg Hx    SOCIAL HISTORY Social History   Tobacco Use   Smoking status: Never   Smokeless tobacco: Never  Vaping Use  Vaping Use: Never used  Substance Use Topics   Alcohol use: No   Drug use: No       OPHTHALMIC EXAM:  Base Eye Exam     Visual Acuity (Snellen - Linear)       Right Left   Dist [place] 20/30 20/30 -2   Dist ph [place] NI NI         Tonometry (Tonopen, 1:19 PM)       Right Left   Pressure 14 16         Pupils       Dark Light Shape React APD   Right 2 1 Round Minimal None   Left 2 1 Round Minimal None         Visual Fields (Counting fingers)       Left Right    Full Full         Extraocular Movement       Right Left    Full, Ortho Full, Ortho         Neuro/Psych     Oriented x3: Yes   Mood/Affect: Normal         Dilation     Both eyes: 1.0% Mydriacyl, 2.5% Phenylephrine @ 1:19 PM           Slit Lamp and Fundus Exam     Slit Lamp Exam       Right Left   Lids/Lashes Meibomian gland dysfunction, Dermatochalasis - upper lid, Ptosis, 3 sutures intact LL Meibomian gland dysfunction, Dermatochalasis - upper lid, Ptosis, 3 sutures intact LL   Conjunctiva/Sclera White and quiet White and quiet   Cornea 2-3+Punctate epithelial erosions, Arcus, dry tear film 3+Punctate epithelial erosions, Arcus, dry tear film   Anterior Chamber Deep and quiet Deep and quiet   Iris Round and dilated, No NVI Round and dilated, No NVI   Lens PCIOL in good position with open PC PCIOL in good position with open PC   Anterior Vitreous  mild Vitreous syneresis mild Vitreous syneresis         Fundus Exam       Right Left   Disc Pink and Sharp trace Pallor, Sharp rim   C/D Ratio 0.4 0.6   Macula Flat, Blunted foveal reflex, scattered Microaneurysms/IRH, cluster of IRH and exudate just inside ST arcades -- improved, light focal laser scars ST macula, mild interval increase in cystic changes ST and SN macula Flat, blunted foveal reflex, scattered MA, no edema   Vessels attenuated, Tortuous mild attenuation, mild tortuosity   Periphery Attached, scattered MA/DBH greatest posteriorly, flat, pigmented lesion superior to disc Attached, scattered DBH greatest posteriorly           IMAGING AND PROCEDURES  Imaging and Procedures for $RemoveBefore'@TODAY'DagWuHIWczWLY$ @  OCT, Retina - OU - Both Eyes       Right Eye Quality was borderline. Central Foveal Thickness: 274. Progression has worsened. Findings include normal foveal contour, no SRF, intraretinal hyper-reflective material, intraretinal fluid (Interval increase in IRF/IRHM ST and SN macula).   Left Eye Quality was borderline. Central Foveal Thickness: 267. Progression has been stable. Findings include normal foveal contour, no IRF, no SRF.   Notes *Images captured and stored on drive  Diagnosis / Impression:  OD: Interval increase in IRF/IRHM ST and SN macula OS: NFP; no IRF/SRF/DME  Clinical management:  See below  Abbreviations: NFP - Normal foveal profile. CME - cystoid macular edema. PED - pigment epithelial detachment. IRF - intraretinal fluid. SRF - subretinal  fluid. EZ - ellipsoid zone. ERM - epiretinal membrane. ORA - outer retinal atrophy. ORT - outer retinal tubulation. SRHM - subretinal hyper-reflective material            ASSESSMENT/PLAN:    ICD-10-CM   1. Moderate nonproliferative diabetic retinopathy of right eye with macular edema associated with type 2 diabetes mellitus (HCC)  E11.3311 OCT, Retina - OU - Both Eyes    2. Essential hypertension  I10     3.  Hypertensive retinopathy of both eyes  H35.033     4. Pseudophakia of both eyes  Z96.1     5. Dry eyes  H04.123      1. Moderate nonproliferative diabetic retinopathy w/ DME, OD  - OS without DME  - A1c was 7.1 on 08.14.23, 6.9 on 08.26.22  - FA (07.14.20 and 7.19.21) shows late leaking MA OU; OD with focal hyperfluorescent leakage in area of DME  - s/p focal laser OD (11.09.20)  - OCT OD: Interval increase in IRF/IRHM ST and SN macula OS: NFP; no IRF/SRF/DME  - BCVA  20/30 OU from 20/25 OU  - discussed mild changes in exam and vision  - no retinal intervention indicated or recommended at this time  - monitor for now  - f/u 6 months, DFE, OCT  2,3. Hypertensive retinopathy OU  - discussed importance of tight BP control  - monitor  4. Pseudophakia OU  - s/p CE/IOL OU (Dr. Albina Billet)  - beautiful surgeries, doing well  - monitor  5. Dry Eyes OU (OD > OS)  - recommend artificial tears and lubricating ointment as needed  Ophthalmic Meds Ordered this visit:  No orders of the defined types were placed in this encounter.    Return in about 6 months (around 09/30/2022) for f/u NPDR OU, DFE, OCT.  There are no Patient Instructions on file for this visit.  This document serves as a record of services personally performed by Gardiner Sleeper, MD, PhD. It was created on their behalf by Orvan Falconer, an ophthalmic technician. The creation of this record is the provider's dictation and/or activities during the visit.    Electronically signed by: Orvan Falconer, OA, 04/01/22  11:17 AM  Gardiner Sleeper, M.D., Ph.D. Diseases & Surgery of the Retina and Vitreous [facility]  I have reviewed the above documentation for accuracy and completeness, and I agree with the above. Gardiner Sleeper, M.D., Ph.D. 04/01/22 11:17 AM   Abbreviations: M myopia (nearsighted); A astigmatism; H hyperopia (farsighted); P presbyopia; Mrx spectacle prescription;  CTL contact  lenses; OD right eye; OS left eye; OU both eyes  XT exotropia; ET esotropia; PEK punctate epithelial keratitis; PEE punctate epithelial erosions; DES dry eye syndrome; MGD meibomian gland dysfunction; ATs artificial tears; PFAT's preservative free artificial tears; [place] nuclear sclerotic cataract; PSC posterior subcapsular cataract; ERM epi-retinal membrane; PVD posterior vitreous detachment; RD retinal detachment; DM diabetes mellitus; DR diabetic retinopathy; NPDR non-proliferative diabetic retinopathy; PDR proliferative diabetic retinopathy; CSME clinically significant macular edema; DME diabetic macular edema; dbh dot blot hemorrhages; CWS cotton wool spot; POAG primary open angle glaucoma; C/D cup-to-disc ratio; HVF humphrey visual field; GVF goldmann visual field; OCT optical coherence tomography; IOP intraocular pressure; BRVO Branch retinal vein occlusion; CRVO central retinal vein occlusion; CRAO central retinal artery occlusion; BRAO branch retinal artery occlusion; RT retinal tear; SB scleral buckle; PPV pars plana vitrectomy; VH Vitreous hemorrhage; PRP panretinal laser photocoagulation; IVK intravitreal kenalog; VMT vitreomacular traction; MH Macular hole;  NVD neovascularization of the disc; NVE neovascularization elsewhere; AREDS age related eye disease study; ARMD age related macular degeneration; POAG primary open angle glaucoma; EBMD epithelial/anterior basement membrane dystrophy; ACIOL anterior chamber intraocular lens; IOL intraocular lens; PCIOL posterior chamber intraocular lens; Phaco/IOL phacoemulsification with intraocular lens placement; [place] photorefractive keratectomy; LASIK laser assisted in situ keratomileusis; HTN hypertension; DM diabetes mellitus; COPD chronic obstructive pulmonary disease

## 2022-03-25 NOTE — Telephone Encounter (Signed)
Requested Prescriptions  Pending Prescriptions Disp Refills  . metoprolol succinate (TOPROL-XL) 50 MG 24 hr tablet [Pharmacy Med Name: METOPROLOL SUCC ER 50 MG TAB] 90 tablet 0    Sig: TAKE 1 TABLET BY MOUTH EVERY DAY WITH OR IMMEDIATELY FOLLOWING A MEAL     Cardiovascular:  Beta Blockers Passed - 03/25/2022  2:11 AM      Passed - Last BP in normal range    BP Readings from Last 1 Encounters:  03/15/22 (!) 134/57         Passed - Last Heart Rate in normal range    Pulse Readings from Last 1 Encounters:  03/15/22 77         Passed - Valid encounter within last 6 months    Recent Outpatient Visits          3 months ago Right hip pain   [place] [name], MD   4 months ago Contusion of head, unspecified part of head, initial encounter   [place], Cammie Mcgee, MD   6 months ago Memory loss   [place], Warren T, MD   9 months ago Interstitial lung disorders [facility])   [place] Pickard, Cammie Mcgee, MD   9 months ago Acute cough   [place] Pickard, Cammie Mcgee, MD

## 2022-03-25 NOTE — Telephone Encounter (Signed)
Tried to call pt's son Octavia Bruckner x 2 but unable to hear anyone answer. Was wanting to see if pt is still taking metoprolol before sending to pharmacy since pt is on hospice as of 03/16/22.

## 2022-03-31 ENCOUNTER — Encounter (INDEPENDENT_AMBULATORY_CARE_PROVIDER_SITE_OTHER): Payer: Self-pay | Admitting: Ophthalmology

## 2022-03-31 ENCOUNTER — Ambulatory Visit (INDEPENDENT_AMBULATORY_CARE_PROVIDER_SITE_OTHER): Payer: Medicare PPO | Admitting: Ophthalmology

## 2022-03-31 DIAGNOSIS — H04123 Dry eye syndrome of bilateral lacrimal glands: Secondary | ICD-10-CM

## 2022-03-31 DIAGNOSIS — H35033 Hypertensive retinopathy, bilateral: Secondary | ICD-10-CM | POA: Diagnosis not present

## 2022-03-31 DIAGNOSIS — I10 Essential (primary) hypertension: Secondary | ICD-10-CM

## 2022-03-31 DIAGNOSIS — E113311 Type 2 diabetes mellitus with moderate nonproliferative diabetic retinopathy with macular edema, right eye: Secondary | ICD-10-CM

## 2022-03-31 DIAGNOSIS — Z961 Presence of intraocular lens: Secondary | ICD-10-CM | POA: Diagnosis not present

## 2022-04-02 ENCOUNTER — Ambulatory Visit: Payer: Medicare PPO | Admitting: Podiatry

## 2022-04-17 ENCOUNTER — Other Ambulatory Visit: Payer: Self-pay | Admitting: Family Medicine

## 2022-04-19 NOTE — Telephone Encounter (Signed)
Requested Prescriptions  Pending Prescriptions Disp Refills  . escitalopram (LEXAPRO) 10 MG tablet [Pharmacy Med Name: ESCITALOPRAM 10 MG TABLET] 90 tablet 0    Sig: TAKE 1 TABLET BY MOUTH EVERY DAY     Psychiatry:  Antidepressants - SSRI Passed - 04/17/2022 11:14 AM      Passed - Valid encounter within last 6 months    Recent Outpatient Visits          4 months ago Right hip pain   [place] [name], MD   5 months ago Contusion of head, unspecified part of head, initial encounter   [place], Cammie Mcgee, MD   6 months ago Memory loss   [place], Warren T, MD   10 months ago Interstitial lung disorders [facility])   [place] Pickard, Cammie Mcgee, MD   10 months ago Acute cough   [place] Pickard, Cammie Mcgee, MD

## 2022-04-23 ENCOUNTER — Other Ambulatory Visit: Payer: Self-pay | Admitting: Family Medicine

## 2022-04-23 NOTE — Telephone Encounter (Signed)
Requested Prescriptions  Pending Prescriptions Disp Refills  . KLOR-CON M20 20 MEQ tablet [Pharmacy Med Name: KLOR-CON M20 TABLET] 90 tablet 0    Sig: TAKE 1 TABLET BY [place] DAY     Endocrinology:  Minerals - Potassium Supplementation Passed - 04/23/2022  2:03 PM      Passed - K in normal range and within 360 days    Potassium  Date Value Ref Range Status  03/15/2022 4.1 3.5 - 5.1 mmol/L Final         Passed - Cr in normal range and within 360 days    Creat  Date Value Ref Range Status  03/27/2021 1.13 (H) 0.60 - 0.95 mg/dL Final   Creatinine, Ser  Date Value Ref Range Status  03/15/2022 0.80 0.44 - 1.00 mg/dL Final   Creatinine,U  Date Value Ref Range Status  06/06/2018 75.0 mg/dL Final         Passed - Valid encounter within last 12 months    Recent Outpatient Visits          4 months ago Right hip pain   [place] [name], MD   5 months ago Contusion of head, unspecified part of head, initial encounter   [place] Pickard, Cammie Mcgee, MD   7 months ago Memory loss   [place], Cammie Mcgee, MD   10 months ago Interstitial lung disorders [facility])   [place] Pickard, Cammie Mcgee, MD   10 months ago Acute cough   [place] Pickard, Cammie Mcgee, MD

## 2022-05-05 ENCOUNTER — Telehealth: Payer: Medicare PPO | Admitting: Nurse Practitioner

## 2022-06-12 ENCOUNTER — Other Ambulatory Visit: Payer: Self-pay | Admitting: Family Medicine

## 2022-06-14 NOTE — Telephone Encounter (Signed)
Called to make appointment for pt.   Spoke with daughter Rebecca Perkins. [name] passed Sunday morning at about 2 am.  Her obituary was published today.  Her remains are under the care of [employer]. On the [employer], there is more information, per her daughter, and a great picture of [name] there.  The family wishes to thank Dr. Dennard Schaumann and all the staff at [facility] for their care over the years. Rebecca Perkins stated that everyone was caring.

## 2022-06-15 ENCOUNTER — Telehealth: Payer: Self-pay

## 2022-06-15 NOTE — Telephone Encounter (Signed)
Called pt to schedule appt. After receiving refill request. This is the note sent with refill request:   Note Called to make appointment for pt.    Spoke with daughter Juliann Pulse. Ms. Boys passed Sunday morning at about 2 am.  Her obituary was published today.  Her remains are under the care of [place]. On the [employer], there is more information, per her daughter, and a great picture of Ms. Tarbell there.   The family wishes to thank Dr. Dennard Schaumann and all the staff at [facility] for their care over the years. Juliann Pulse stated that everyone was caring.         This note was sent as part of the refill request. Refill was approved. Unsure if note was given to provider.   Called office to notify of pt's passing.

## 2022-06-16 ENCOUNTER — Telehealth: Payer: Self-pay | Admitting: Family Medicine

## 2022-06-16 NOTE — Telephone Encounter (Signed)
Left message for patient to call back and schedule Medicare Annual Wellness Visit (AWV) either virtually or in office. Left  my [name] number [phone]   Last AWV 04/03/21 please schedule with Nurse Health Adviser   45 min for awv-i and in office appointments 30 min for awv-s  phone/virtual appointments

## 2022-07-02 DEATH — deceased

## 2022-09-29 ENCOUNTER — Encounter (INDEPENDENT_AMBULATORY_CARE_PROVIDER_SITE_OTHER): Payer: Medicare PPO | Admitting: Ophthalmology
